# Patient Record
Sex: Male | Born: 1950
Health system: Southern US, Community
[De-identification: ages and names within clinical notes are randomized; demographics above are authoritative.]

## PROBLEM LIST (undated history)

## (undated) DIAGNOSIS — M109 Gout, unspecified: Secondary | ICD-10-CM

## (undated) DIAGNOSIS — I2510 Atherosclerotic heart disease of native coronary artery without angina pectoris: Secondary | ICD-10-CM

## (undated) DIAGNOSIS — K621 Rectal polyp: Secondary | ICD-10-CM

## (undated) DIAGNOSIS — N200 Calculus of kidney: Secondary | ICD-10-CM

## (undated) DIAGNOSIS — K635 Polyp of colon: Secondary | ICD-10-CM

## (undated) DIAGNOSIS — E785 Hyperlipidemia, unspecified: Secondary | ICD-10-CM

## (undated) DIAGNOSIS — E079 Disorder of thyroid, unspecified: Secondary | ICD-10-CM

## (undated) DIAGNOSIS — R9431 Abnormal electrocardiogram [ECG] [EKG]: Secondary | ICD-10-CM

## (undated) DIAGNOSIS — E039 Hypothyroidism, unspecified: Secondary | ICD-10-CM

## (undated) DIAGNOSIS — R578 Other shock: Secondary | ICD-10-CM

## (undated) DIAGNOSIS — K921 Melena: Secondary | ICD-10-CM

## (undated) DIAGNOSIS — E291 Testicular hypofunction: Secondary | ICD-10-CM

## (undated) DIAGNOSIS — I5082 Biventricular heart failure: Secondary | ICD-10-CM

## (undated) DIAGNOSIS — I10 Essential (primary) hypertension: Secondary | ICD-10-CM

## (undated) DIAGNOSIS — J189 Pneumonia, unspecified organism: Secondary | ICD-10-CM

## (undated) HISTORY — DX: Disorder of thyroid, unspecified: E07.9

## (undated) HISTORY — DX: Hyperlipidemia, unspecified: E78.5

## (undated) HISTORY — DX: Essential (primary) hypertension: I10

## (undated) HISTORY — PX: CARDIAC CATHETERIZATION: SHX172

## (undated) HISTORY — DX: Atherosclerotic heart disease of native coronary artery without angina pectoris: I25.10

## (undated) HISTORY — DX: Hypothyroidism, unspecified: E03.9

## (undated) HISTORY — DX: Biventricular heart failure: I50.82

## (undated) HISTORY — DX: Abnormal electrocardiogram (ECG) (EKG): R94.31

## (undated) HISTORY — DX: Polyp of colon: K63.5

## (undated) HISTORY — DX: Testicular hypofunction: E29.1

## (undated) HISTORY — DX: Rectal polyp: K62.1

## (undated) HISTORY — PX: FOOT SURGERY: SHX648

---

## 2002-10-10 ENCOUNTER — Encounter: Payer: Self-pay | Admitting: Family Medicine

## 2002-10-10 ENCOUNTER — Ambulatory Visit (HOSPITAL_COMMUNITY): Admission: RE | Admit: 2002-10-10 | Discharge: 2002-10-10 | Payer: Self-pay | Admitting: Family Medicine

## 2005-08-07 ENCOUNTER — Emergency Department (HOSPITAL_COMMUNITY): Admission: EM | Admit: 2005-08-07 | Discharge: 2005-08-07 | Payer: Self-pay | Admitting: Emergency Medicine

## 2013-02-04 ENCOUNTER — Other Ambulatory Visit: Payer: Self-pay | Admitting: Family Medicine

## 2013-02-04 DIAGNOSIS — R7989 Other specified abnormal findings of blood chemistry: Secondary | ICD-10-CM

## 2013-02-04 DIAGNOSIS — R945 Abnormal results of liver function studies: Principal | ICD-10-CM

## 2013-02-06 ENCOUNTER — Ambulatory Visit
Admission: RE | Admit: 2013-02-06 | Discharge: 2013-02-06 | Disposition: A | Discharge status: Home / Self Care | Payer: BC Managed Care – PPO | Source: Ambulatory Visit | Attending: Family Medicine | Admitting: Family Medicine

## 2013-02-06 DIAGNOSIS — R7989 Other specified abnormal findings of blood chemistry: Secondary | ICD-10-CM

## 2013-02-06 DIAGNOSIS — R945 Abnormal results of liver function studies: Principal | ICD-10-CM

## 2013-07-12 ENCOUNTER — Encounter (HOSPITAL_COMMUNITY): Payer: Self-pay | Admitting: Emergency Medicine

## 2013-07-12 ENCOUNTER — Emergency Department (HOSPITAL_COMMUNITY)
Admission: EM | Admit: 2013-07-12 | Discharge: 2013-07-12 | Disposition: A | Discharge status: Home / Self Care | Payer: BC Managed Care – PPO | Source: Home / Self Care

## 2013-07-12 ENCOUNTER — Emergency Department (INDEPENDENT_AMBULATORY_CARE_PROVIDER_SITE_OTHER): Payer: BC Managed Care – PPO

## 2013-07-12 DIAGNOSIS — R059 Cough, unspecified: Secondary | ICD-10-CM

## 2013-07-12 DIAGNOSIS — R05 Cough: Secondary | ICD-10-CM

## 2013-07-12 DIAGNOSIS — R0982 Postnasal drip: Secondary | ICD-10-CM

## 2013-07-12 HISTORY — DX: Pneumonia, unspecified organism: J18.9

## 2013-07-12 NOTE — Discharge Instructions (Signed)
Cough, Adult Allegra 180 mg a day If needed try ChlorTrimeton 2 mg for drying nasal passages Robitussin DM for cough Water before bed  A cough is a reflex that helps clear your throat and airways. It can help heal the body or may be a reaction to an irritated airway. A cough may only last 2 or 3 weeks (acute) or may last more than 8 weeks (chronic).  CAUSES Acute cough:  Viral or bacterial infections. Chronic cough:  Infections.  Allergies.  Asthma.  Post-nasal drip.  Smoking.  Heartburn or acid reflux.  Some medicines.  Chronic lung problems (COPD).  Cancer. SYMPTOMS   Cough.  Fever.  Chest pain.  Increased breathing rate.  High-pitched whistling sound when breathing (wheezing).  Colored mucus that you cough up (sputum). TREATMENT   A bacterial cough may be treated with antibiotic medicine.  A viral cough must run its course and will not respond to antibiotics.  Your caregiver may recommend other treatments if you have a chronic cough. HOME CARE INSTRUCTIONS   Only take over-the-counter or prescription medicines for pain, discomfort, or fever as directed by your caregiver. Use cough suppressants only as directed by your caregiver.  Use a cold steam vaporizer or humidifier in your bedroom or home to help loosen secretions.  Sleep in a semi-upright position if your cough is worse at night.  Rest as needed.  Stop smoking if you smoke. SEEK IMMEDIATE MEDICAL CARE IF:   You have pus in your sputum.  Your cough starts to worsen.  You cannot control your cough with suppressants and are losing sleep.  You begin coughing up blood.  You have difficulty breathing.  You develop pain which is getting worse or is uncontrolled with medicine.  You have a fever. MAKE SURE YOU:   Understand these instructions.  Will watch your condition.  Will get help right away if you are not doing well or get worse. Document Released: 07/09/2010 Document Revised:  04/04/2011 Document Reviewed: 07/09/2010 [facility] Patient Information 2015 [place], [place]. This information is not intended to replace advice given to you by your health care provider. Make sure you discuss any questions you have with your health care provider.

## 2013-07-12 NOTE — ED Notes (Signed)
Sick w strep from grandson, now developed cough . "prone to pneumonia"

## 2013-07-12 NOTE — ED Provider Notes (Signed)
CSN: [id number]     Arrival date & time 07/12/13  0919 History   First MD Initiated Contact with Patient 07/12/13 [phone]     Chief Complaint  Patient presents with  . URI   (Consider location/radiation/quality/duration/timing/severity/associated sxs/prior Treatment) HPI Comments: 63 year old male is accompanied by his wife with the complaint of cough and generalized muscle aches for the past 2 weeks. He denies fever or shortness of breath. Associated symptoms include nasal congestion, PND and rhinorrhea. He voices concern for bronchopneumonia after having a sore throat 3 weeks ago.   Past Medical History  Diagnosis Date  . Pneumonia    History reviewed. No pertinent past surgical history. History reviewed. No pertinent family history. History  Substance Use Topics  . Smoking status: Former Smoker    Quit date: 01/24/2002  . Smokeless tobacco: Not on file  . Alcohol Use: No    Review of Systems  Constitutional: Positive for fatigue. Negative for fever, chills and activity change.  HENT: Positive for congestion, postnasal drip and rhinorrhea. Negative for ear pain, sore throat and trouble swallowing.   Respiratory: Positive for cough. Negative for choking, chest tightness, shortness of breath, wheezing and stridor.   Cardiovascular: Negative.   Gastrointestinal: Positive for nausea.  Genitourinary: Negative.   Musculoskeletal: Positive for myalgias.  Skin: Negative for rash.  Neurological: Negative for dizziness and numbness.    Allergies  Codeine  Home Medications   Prior to Admission medications   Medication Sig Start Date End Date Taking? Authorizing Provider  thyroid (ARMOUR) 120 MG tablet Take 120 mg by mouth daily before breakfast.   Yes Historical Provider, MD   BP 154/91  Pulse 70  Temp(Src) 97.9 F (36.6 C)  Resp 18  SpO2 95% Physical Exam  Nursing note and vitals reviewed. Constitutional: He is oriented to person, place, and time. He appears well-developed  and well-nourished. No distress.  HENT:  Right Ear: External ear normal.  Left Ear: External ear normal.  Mouth/Throat: No oropharyngeal exudate.  Minor redness, clear PND  Eyes: Conjunctivae and EOM are normal.  Neck: Normal range of motion. Neck supple.  Cardiovascular: Normal rate, regular rhythm and normal heart sounds.   Pulmonary/Chest: Effort normal and breath sounds normal. No respiratory distress. He has no wheezes. He has no rales.  Musculoskeletal: Normal range of motion. He exhibits no edema.  Lymphadenopathy:    He has no cervical adenopathy.  Neurological: He is alert and oriented to person, place, and time.  Skin: Skin is warm and dry. No rash noted.  Psychiatric: He has a normal mood and affect.    ED Course  Procedures (including critical care time) Labs Review Labs Reviewed - No data to display  Imaging Review Dg Chest 2 View  07/12/2013   CLINICAL DATA:  Cough.  Fever.  Wheezing.  EXAM: CHEST  2 VIEW  COMPARISON:  None.  FINDINGS: Heart size is normal low lung volumes seen. Mild coarse interstitial prominence noted in both lung bases which is likely chronic. No evidence of pulmonary airspace disease or edema. No evidence of pleural effusion. No mass or lymphadenopathy identified.  IMPRESSION: Bibasilar pulmonary interstitial prominence, likely chronic in nature. No other significant abnormality identified.   Electronically Signed   By: Earle Gell M.D.   On: 07/12/2013 10:39     MDM   1. Cough   2. PND (post-nasal drip)     Allegra or chlortrimeton Saline nasal spray Robitussin DM Lots of fluids  Janne Napoleon, NP 07/12/13 [phone]

## 2013-07-13 NOTE — ED Provider Notes (Signed)
Medical screening examination/treatment/procedure(s) were performed by a resident physician or non-physician practitioner and as the supervising physician I was immediately available for consultation/collaboration.  Lynne Leader, MD    Gregor Hams, MD 07/13/13 [phone]

## 2014-03-07 ENCOUNTER — Encounter: Payer: Self-pay | Admitting: Internal Medicine

## 2014-03-07 ENCOUNTER — Ambulatory Visit (INDEPENDENT_AMBULATORY_CARE_PROVIDER_SITE_OTHER): Payer: BLUE CROSS/BLUE SHIELD | Admitting: Internal Medicine

## 2014-03-07 VITALS — BP 122/76 | HR 84 | Ht 71.0 in | Wt 240.0 lb

## 2014-03-07 DIAGNOSIS — R9431 Abnormal electrocardiogram [ECG] [EKG]: Secondary | ICD-10-CM

## 2014-03-07 NOTE — Patient Instructions (Signed)
Your physician recommends that you continue on your current medications as directed. Please refer to the Current Medication list given to you today.  Your physician has recommended that you wear a holter monitor. Holter monitors are medical devices that record the heart's electrical activity. Doctors most often use these monitors to diagnose arrhythmias. Arrhythmias are problems with the speed or rhythm of the heartbeat. The monitor is a small, portable device. You can wear one while you do your normal daily activities. This is usually used to diagnose what is causing palpitations/syncope (passing out).   

## 2014-03-07 NOTE — Progress Notes (Signed)
Cardiology Office Note   Date:  03/07/2014   ID:  Blake West, DOB [birth year], MRN [id number]  PCP:  Maximino Greenland, MD  Cardiologist:   Dorris Carnes, MD   [name] is a 64 yo who presetns for evaluation of ectopy on EKG    History of Present Illness: Blake West is a 65 y.o. male with a history of skipping and irreg heart beat for years  He denies palpitations.  No SOB  No CP  No dizziness H was seen by primary MD  EKG on 2/4 sohwed SR with PACs and Possible PVCs  First degree AV block     Current Outpatient Prescriptions  Medication Sig Dispense Refill  . Ascorbic Acid (VITAMIN C) 100 MG tablet Take 500 mg by mouth daily.    . cholecalciferol (VITAMIN D) 400 UNITS TABS tablet Take 400 Units by mouth.    . ergocalciferol (VITAMIN D2) 50000 UNITS capsule Take 50,000 Units by mouth once a week.    . Nutritional Supplements (DHEA PO) Take by mouth.    . Probiotic Product (PROBIOTIC DAILY PO) Take by mouth.    . thyroid (ARMOUR) 120 MG tablet Take 120 mg by mouth daily before breakfast.     No current facility-administered medications for this visit.    Allergies:   Codeine   Past Medical History  Diagnosis Date  . Pneumonia   . Abnormal EKG   . Hypertension   . Thyroid disease   . Testicular hypofunction     No past surgical history on file.   Social History:  The patient  reports that he quit smoking about 12 years ago. He does not have any smokeless tobacco history on file. He reports that he does not drink alcohol.   Family History:  The patient's family history includes Heart attack in his mother. 2 brothers with CAD  Received stents.  One of these brothers has an Fe storage disease   ROS:  Please see the history of present illness. All other systems are reviewed and  Negative to the above problem except as noted.    PHYSICAL EXAM: VS:  BP 122/76 mmHg  Pulse 84  Ht 5\' 11"  (1.803 m)  Wt 240 lb (108.863 kg)  BMI 33.49 kg/m2  GEN: Well nourished, well  developed, in no acute distress HEENT: normal Neck: no JVD, carotid bruits, or masses Cardiac: RRR; no murmurs, rubs, or gallops,no edema  Respiratory:  clear to auscultation bilaterally, normal work of breathing GI: soft, nontender, nondistended, + BS  No hepatomegaly  MS: no deformity Moving all extremities   Skin: warm and dry, no rash Neuro:  Strength and sensation are intact Psych: euthymic mood, full affect   EKG:  EKG is ordered today.  It showed SR 88  First degree AV block  PACs and PVCs     Lipid Panel No results found for: CHOL, TRIG, HDL, CHOLHDL, VLDL, LDLCALC, LDLDIRECT    Wt Readings from Last 3 Encounters:  03/07/14 240 lb (108.863 kg)      ASSESSMENT AND PLAN:  1.  Ectopy  [name] is asymptomatic  I would recomm Holter monitor to determine burden  Continue activities Further testing based on holter results    2  Health care maintenance:  Hgb 18   Hct 52.3   LDL 100  HDL 28   Recomm wt loss and exercise     Current medicines are reviewed at length with the patient today.  The patient does  not have concerns regarding medicines.   Labs/ tests ordered today include:  Holter monitor    Disposition:   FU with me is based on test results    Signed, Dorris Carnes, MD  03/07/2014 12:01 PM    [facility] [address], [place], [place]  [address] Phone: [phone]; Fax: [phone]

## 2014-03-17 ENCOUNTER — Encounter: Payer: Self-pay | Admitting: *Deleted

## 2014-03-17 ENCOUNTER — Encounter (INDEPENDENT_AMBULATORY_CARE_PROVIDER_SITE_OTHER): Payer: BLUE CROSS/BLUE SHIELD

## 2014-03-17 DIAGNOSIS — R9431 Abnormal electrocardiogram [ECG] [EKG]: Secondary | ICD-10-CM | POA: Diagnosis not present

## 2014-03-17 DIAGNOSIS — I440 Atrioventricular block, first degree: Secondary | ICD-10-CM

## 2014-03-17 DIAGNOSIS — I493 Ventricular premature depolarization: Secondary | ICD-10-CM | POA: Diagnosis not present

## 2014-03-17 NOTE — Progress Notes (Signed)
Patient ID: Blake West, male   DOB: 12/02/1950, 65 y.o.   MRN: [id number] [employer] 24 hour holter monitor applied to patient.

## 2014-03-24 ENCOUNTER — Telehealth: Payer: Self-pay | Admitting: Internal Medicine

## 2014-03-24 NOTE — Telephone Encounter (Signed)
New Msg        Tiffany with [employer] calling in regards to STAt    Day 1  Available on web,  Day 2 has no data on pt  48 hr holter monitor.   No return call needed.

## 2014-04-28 ENCOUNTER — Telehealth: Payer: Self-pay | Admitting: *Deleted

## 2014-04-28 NOTE — Telephone Encounter (Signed)
24 hr holter reviewed by Dr. Harrington Challenger "sinus rhythm 67-130 Average HR 102 BPM Occasional PVC's (1% total) Rec: -no RX needed. Pt asymptomatic, stay active"  LEFT MESSAGE FOR PATIENT TO CALL BACK TO INFORM.

## 2014-05-08 ENCOUNTER — Ambulatory Visit
Admission: RE | Admit: 2014-05-08 | Discharge: 2014-05-08 | Disposition: A | Discharge status: Home / Self Care | Payer: BLUE CROSS/BLUE SHIELD | Source: Ambulatory Visit | Attending: Nurse Practitioner | Admitting: Nurse Practitioner

## 2014-05-08 ENCOUNTER — Other Ambulatory Visit: Payer: Self-pay | Admitting: Nurse Practitioner

## 2014-05-08 DIAGNOSIS — J209 Acute bronchitis, unspecified: Secondary | ICD-10-CM

## 2014-05-22 NOTE — Telephone Encounter (Signed)
Called patient back to make sure he received message about monitor and that he did not have any questions/concerns. Reviewed results.  He would like Dr. Baird Cancer to be informed, and would like a copy of the interpretation sent to him as well.   He is feeling well. Confirmed his home address. No other concerns/questions at this time.

## 2015-04-27 DIAGNOSIS — E291 Testicular hypofunction: Secondary | ICD-10-CM | POA: Diagnosis not present

## 2015-04-27 DIAGNOSIS — N400 Enlarged prostate without lower urinary tract symptoms: Secondary | ICD-10-CM | POA: Diagnosis not present

## 2015-04-27 DIAGNOSIS — K760 Fatty (change of) liver, not elsewhere classified: Secondary | ICD-10-CM | POA: Diagnosis not present

## 2015-04-27 DIAGNOSIS — E782 Mixed hyperlipidemia: Secondary | ICD-10-CM | POA: Diagnosis not present

## 2015-04-27 DIAGNOSIS — E063 Autoimmune thyroiditis: Secondary | ICD-10-CM | POA: Diagnosis not present

## 2015-04-27 DIAGNOSIS — E559 Vitamin D deficiency, unspecified: Secondary | ICD-10-CM | POA: Diagnosis not present

## 2015-04-28 DIAGNOSIS — M25562 Pain in left knee: Secondary | ICD-10-CM | POA: Diagnosis not present

## 2015-04-28 DIAGNOSIS — R262 Difficulty in walking, not elsewhere classified: Secondary | ICD-10-CM | POA: Diagnosis not present

## 2015-04-28 DIAGNOSIS — M170 Bilateral primary osteoarthritis of knee: Secondary | ICD-10-CM | POA: Diagnosis not present

## 2015-04-28 DIAGNOSIS — M25561 Pain in right knee: Secondary | ICD-10-CM | POA: Diagnosis not present

## 2015-05-14 DIAGNOSIS — M25562 Pain in left knee: Secondary | ICD-10-CM | POA: Diagnosis not present

## 2015-05-14 DIAGNOSIS — R262 Difficulty in walking, not elsewhere classified: Secondary | ICD-10-CM | POA: Diagnosis not present

## 2015-05-14 DIAGNOSIS — M170 Bilateral primary osteoarthritis of knee: Secondary | ICD-10-CM | POA: Diagnosis not present

## 2015-05-14 DIAGNOSIS — M25561 Pain in right knee: Secondary | ICD-10-CM | POA: Diagnosis not present

## 2015-05-19 DIAGNOSIS — M1712 Unilateral primary osteoarthritis, left knee: Secondary | ICD-10-CM | POA: Diagnosis not present

## 2015-05-19 DIAGNOSIS — M25562 Pain in left knee: Secondary | ICD-10-CM | POA: Diagnosis not present

## 2015-05-21 DIAGNOSIS — M25561 Pain in right knee: Secondary | ICD-10-CM | POA: Diagnosis not present

## 2015-05-21 DIAGNOSIS — M1711 Unilateral primary osteoarthritis, right knee: Secondary | ICD-10-CM | POA: Diagnosis not present

## 2015-05-26 DIAGNOSIS — M25562 Pain in left knee: Secondary | ICD-10-CM | POA: Diagnosis not present

## 2015-05-26 DIAGNOSIS — M1712 Unilateral primary osteoarthritis, left knee: Secondary | ICD-10-CM | POA: Diagnosis not present

## 2015-05-28 DIAGNOSIS — M1711 Unilateral primary osteoarthritis, right knee: Secondary | ICD-10-CM | POA: Diagnosis not present

## 2015-05-28 DIAGNOSIS — M25561 Pain in right knee: Secondary | ICD-10-CM | POA: Diagnosis not present

## 2015-06-02 DIAGNOSIS — M25562 Pain in left knee: Secondary | ICD-10-CM | POA: Diagnosis not present

## 2015-06-02 DIAGNOSIS — M1712 Unilateral primary osteoarthritis, left knee: Secondary | ICD-10-CM | POA: Diagnosis not present

## 2015-06-04 DIAGNOSIS — M1711 Unilateral primary osteoarthritis, right knee: Secondary | ICD-10-CM | POA: Diagnosis not present

## 2015-06-04 DIAGNOSIS — M25561 Pain in right knee: Secondary | ICD-10-CM | POA: Diagnosis not present

## 2015-06-10 DIAGNOSIS — M25562 Pain in left knee: Secondary | ICD-10-CM | POA: Diagnosis not present

## 2015-06-10 DIAGNOSIS — M25561 Pain in right knee: Secondary | ICD-10-CM | POA: Diagnosis not present

## 2015-06-10 DIAGNOSIS — M170 Bilateral primary osteoarthritis of knee: Secondary | ICD-10-CM | POA: Diagnosis not present

## 2015-09-17 ENCOUNTER — Emergency Department (HOSPITAL_COMMUNITY): Payer: No Typology Code available for payment source

## 2015-09-17 ENCOUNTER — Encounter (HOSPITAL_COMMUNITY): Payer: Self-pay | Admitting: *Deleted

## 2015-09-17 ENCOUNTER — Emergency Department (HOSPITAL_COMMUNITY)
Admission: EM | Admit: 2015-09-17 | Discharge: 2015-09-17 | Disposition: A | Discharge status: Home / Self Care | Payer: No Typology Code available for payment source | Attending: Emergency Medicine | Admitting: Emergency Medicine

## 2015-09-17 DIAGNOSIS — Y92410 Unspecified street and highway as the place of occurrence of the external cause: Secondary | ICD-10-CM | POA: Diagnosis not present

## 2015-09-17 DIAGNOSIS — Y999 Unspecified external cause status: Secondary | ICD-10-CM | POA: Diagnosis not present

## 2015-09-17 DIAGNOSIS — S0990XA Unspecified injury of head, initial encounter: Secondary | ICD-10-CM | POA: Diagnosis not present

## 2015-09-17 DIAGNOSIS — S161XXA Strain of muscle, fascia and tendon at neck level, initial encounter: Secondary | ICD-10-CM | POA: Insufficient documentation

## 2015-09-17 DIAGNOSIS — Y939 Activity, unspecified: Secondary | ICD-10-CM | POA: Diagnosis not present

## 2015-09-17 DIAGNOSIS — Z87891 Personal history of nicotine dependence: Secondary | ICD-10-CM | POA: Diagnosis not present

## 2015-09-17 DIAGNOSIS — I10 Essential (primary) hypertension: Secondary | ICD-10-CM | POA: Insufficient documentation

## 2015-09-17 DIAGNOSIS — R200 Anesthesia of skin: Secondary | ICD-10-CM | POA: Diagnosis not present

## 2015-09-17 DIAGNOSIS — S199XXA Unspecified injury of neck, initial encounter: Secondary | ICD-10-CM | POA: Diagnosis not present

## 2015-09-17 HISTORY — DX: Gout, unspecified: M10.9

## 2015-09-17 MED ORDER — NAPROXEN SODIUM 220 MG PO TABS: 220.0000 mg | ORAL_TABLET | Freq: Two times a day (BID) | ORAL | 0 refills | Status: DC

## 2015-09-17 MED ORDER — TRAMADOL HCL 50 MG PO TABS: 50.0000 mg | ORAL_TABLET | Freq: Two times a day (BID) | ORAL | 0 refills | Status: DC | PRN

## 2015-09-17 MED ORDER — CYCLOBENZAPRINE HCL 10 MG PO TABS: 10.0000 mg | ORAL_TABLET | Freq: Two times a day (BID) | ORAL | 0 refills | Status: DC | PRN

## 2015-09-17 NOTE — ED Notes (Signed)
CT's placed per verbal order of Dr Eulis Foster.

## 2015-09-17 NOTE — ED Notes (Signed)
Called CT.  They stated pt is in CT at this time.
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
## 2015-11-09 DIAGNOSIS — M25562 Pain in left knee: Secondary | ICD-10-CM | POA: Diagnosis not present

## 2015-11-09 DIAGNOSIS — M25561 Pain in right knee: Secondary | ICD-10-CM | POA: Diagnosis not present

## 2015-11-09 DIAGNOSIS — M170 Bilateral primary osteoarthritis of knee: Secondary | ICD-10-CM | POA: Diagnosis not present

## 2015-11-24 DIAGNOSIS — R262 Difficulty in walking, not elsewhere classified: Secondary | ICD-10-CM | POA: Diagnosis not present

## 2015-11-24 DIAGNOSIS — M25562 Pain in left knee: Secondary | ICD-10-CM | POA: Diagnosis not present

## 2015-11-24 DIAGNOSIS — M1712 Unilateral primary osteoarthritis, left knee: Secondary | ICD-10-CM | POA: Diagnosis not present

## 2016-01-20 DIAGNOSIS — M25562 Pain in left knee: Secondary | ICD-10-CM | POA: Diagnosis not present

## 2016-01-20 DIAGNOSIS — M25561 Pain in right knee: Secondary | ICD-10-CM | POA: Diagnosis not present

## 2016-01-20 DIAGNOSIS — M170 Bilateral primary osteoarthritis of knee: Secondary | ICD-10-CM | POA: Diagnosis not present

## 2016-02-04 DIAGNOSIS — M25561 Pain in right knee: Secondary | ICD-10-CM | POA: Diagnosis not present

## 2016-02-04 DIAGNOSIS — M25562 Pain in left knee: Secondary | ICD-10-CM | POA: Diagnosis not present

## 2016-02-04 DIAGNOSIS — M170 Bilateral primary osteoarthritis of knee: Secondary | ICD-10-CM | POA: Diagnosis not present

## 2016-02-09 DIAGNOSIS — M1712 Unilateral primary osteoarthritis, left knee: Secondary | ICD-10-CM | POA: Diagnosis not present

## 2016-02-09 DIAGNOSIS — M25562 Pain in left knee: Secondary | ICD-10-CM | POA: Diagnosis not present

## 2016-02-12 DIAGNOSIS — M25561 Pain in right knee: Secondary | ICD-10-CM | POA: Diagnosis not present

## 2016-02-12 DIAGNOSIS — M1711 Unilateral primary osteoarthritis, right knee: Secondary | ICD-10-CM | POA: Diagnosis not present

## 2016-02-16 DIAGNOSIS — M1712 Unilateral primary osteoarthritis, left knee: Secondary | ICD-10-CM | POA: Diagnosis not present

## 2016-02-16 DIAGNOSIS — M25562 Pain in left knee: Secondary | ICD-10-CM | POA: Diagnosis not present

## 2016-02-18 DIAGNOSIS — M1711 Unilateral primary osteoarthritis, right knee: Secondary | ICD-10-CM | POA: Diagnosis not present

## 2016-02-18 DIAGNOSIS — M25561 Pain in right knee: Secondary | ICD-10-CM | POA: Diagnosis not present

## 2016-02-23 DIAGNOSIS — M1712 Unilateral primary osteoarthritis, left knee: Secondary | ICD-10-CM | POA: Diagnosis not present

## 2016-02-23 DIAGNOSIS — M25562 Pain in left knee: Secondary | ICD-10-CM | POA: Diagnosis not present

## 2016-02-25 DIAGNOSIS — M25561 Pain in right knee: Secondary | ICD-10-CM | POA: Diagnosis not present

## 2016-02-25 DIAGNOSIS — M1711 Unilateral primary osteoarthritis, right knee: Secondary | ICD-10-CM | POA: Diagnosis not present

## 2016-03-03 DIAGNOSIS — M25562 Pain in left knee: Secondary | ICD-10-CM | POA: Diagnosis not present

## 2016-03-03 DIAGNOSIS — M25561 Pain in right knee: Secondary | ICD-10-CM | POA: Diagnosis not present

## 2016-03-03 DIAGNOSIS — M170 Bilateral primary osteoarthritis of knee: Secondary | ICD-10-CM | POA: Diagnosis not present

## 2016-11-24 ENCOUNTER — Encounter (HOSPITAL_COMMUNITY): Payer: Self-pay | Admitting: Emergency Medicine

## 2016-11-24 ENCOUNTER — Ambulatory Visit (INDEPENDENT_AMBULATORY_CARE_PROVIDER_SITE_OTHER): Payer: Self-pay

## 2016-11-24 ENCOUNTER — Ambulatory Visit (HOSPITAL_COMMUNITY)
Admission: EM | Admit: 2016-11-24 | Discharge: 2016-11-24 | Disposition: A | Discharge status: Home / Self Care | Payer: Self-pay | Attending: Emergency Medicine | Admitting: Emergency Medicine

## 2016-11-24 DIAGNOSIS — M79605 Pain in left leg: Secondary | ICD-10-CM

## 2016-11-24 DIAGNOSIS — M79604 Pain in right leg: Secondary | ICD-10-CM

## 2016-11-24 DIAGNOSIS — J9811 Atelectasis: Secondary | ICD-10-CM

## 2016-11-24 DIAGNOSIS — R0781 Pleurodynia: Secondary | ICD-10-CM

## 2016-11-24 HISTORY — DX: Calculus of kidney: N20.0

## 2016-11-24 MED ORDER — DICLOFENAC SODIUM 1 % TD GEL: 4.0000 g | Freq: Three times a day (TID) | TRANSDERMAL | 0 refills | Status: DC | PRN
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
## 2016-11-24 NOTE — Discharge Instructions (Addendum)
Recommend continue Ibuprofen 600mg  every 6 hours as needed for pain. May also use Voltaren gel- apply 3 times a day to affected areas as needed. No heavy lifting for the next 5 days. Follow-up with your PCP in 5 to 7 days if symptoms do not resolve
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
## 2017-11-09 DIAGNOSIS — M545 Low back pain: Secondary | ICD-10-CM | POA: Diagnosis not present

## 2017-11-09 DIAGNOSIS — M9903 Segmental and somatic dysfunction of lumbar region: Secondary | ICD-10-CM | POA: Diagnosis not present

## 2017-11-09 DIAGNOSIS — M9902 Segmental and somatic dysfunction of thoracic region: Secondary | ICD-10-CM | POA: Diagnosis not present

## 2017-11-09 DIAGNOSIS — M546 Pain in thoracic spine: Secondary | ICD-10-CM | POA: Diagnosis not present

## 2017-11-16 DIAGNOSIS — M545 Low back pain: Secondary | ICD-10-CM | POA: Diagnosis not present

## 2017-11-16 DIAGNOSIS — M9903 Segmental and somatic dysfunction of lumbar region: Secondary | ICD-10-CM | POA: Diagnosis not present

## 2017-11-16 DIAGNOSIS — M546 Pain in thoracic spine: Secondary | ICD-10-CM | POA: Diagnosis not present

## 2017-11-16 DIAGNOSIS — M9902 Segmental and somatic dysfunction of thoracic region: Secondary | ICD-10-CM | POA: Diagnosis not present

## 2017-11-20 DIAGNOSIS — M9902 Segmental and somatic dysfunction of thoracic region: Secondary | ICD-10-CM | POA: Diagnosis not present

## 2017-11-20 DIAGNOSIS — M546 Pain in thoracic spine: Secondary | ICD-10-CM | POA: Diagnosis not present

## 2017-11-20 DIAGNOSIS — M545 Low back pain: Secondary | ICD-10-CM | POA: Diagnosis not present

## 2017-11-20 DIAGNOSIS — M9903 Segmental and somatic dysfunction of lumbar region: Secondary | ICD-10-CM | POA: Diagnosis not present

## 2017-11-30 DIAGNOSIS — M9902 Segmental and somatic dysfunction of thoracic region: Secondary | ICD-10-CM | POA: Diagnosis not present

## 2017-11-30 DIAGNOSIS — M545 Low back pain: Secondary | ICD-10-CM | POA: Diagnosis not present

## 2017-11-30 DIAGNOSIS — M9903 Segmental and somatic dysfunction of lumbar region: Secondary | ICD-10-CM | POA: Diagnosis not present

## 2017-11-30 DIAGNOSIS — M546 Pain in thoracic spine: Secondary | ICD-10-CM | POA: Diagnosis not present

## 2017-12-14 DIAGNOSIS — M9902 Segmental and somatic dysfunction of thoracic region: Secondary | ICD-10-CM | POA: Diagnosis not present

## 2017-12-14 DIAGNOSIS — M546 Pain in thoracic spine: Secondary | ICD-10-CM | POA: Diagnosis not present

## 2017-12-14 DIAGNOSIS — M545 Low back pain: Secondary | ICD-10-CM | POA: Diagnosis not present

## 2017-12-14 DIAGNOSIS — M9903 Segmental and somatic dysfunction of lumbar region: Secondary | ICD-10-CM | POA: Diagnosis not present

## 2018-01-11 DIAGNOSIS — M9902 Segmental and somatic dysfunction of thoracic region: Secondary | ICD-10-CM | POA: Diagnosis not present

## 2018-01-11 DIAGNOSIS — M9903 Segmental and somatic dysfunction of lumbar region: Secondary | ICD-10-CM | POA: Diagnosis not present

## 2018-01-11 DIAGNOSIS — M546 Pain in thoracic spine: Secondary | ICD-10-CM | POA: Diagnosis not present

## 2018-01-11 DIAGNOSIS — M545 Low back pain: Secondary | ICD-10-CM | POA: Diagnosis not present
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
## 2018-02-15 DIAGNOSIS — M9902 Segmental and somatic dysfunction of thoracic region: Secondary | ICD-10-CM | POA: Diagnosis not present

## 2018-02-15 DIAGNOSIS — M545 Low back pain: Secondary | ICD-10-CM | POA: Diagnosis not present

## 2018-02-15 DIAGNOSIS — M546 Pain in thoracic spine: Secondary | ICD-10-CM | POA: Diagnosis not present

## 2018-02-15 DIAGNOSIS — M9903 Segmental and somatic dysfunction of lumbar region: Secondary | ICD-10-CM | POA: Diagnosis not present

## 2018-02-22 DIAGNOSIS — M9903 Segmental and somatic dysfunction of lumbar region: Secondary | ICD-10-CM | POA: Diagnosis not present

## 2018-02-22 DIAGNOSIS — M9902 Segmental and somatic dysfunction of thoracic region: Secondary | ICD-10-CM | POA: Diagnosis not present

## 2018-02-22 DIAGNOSIS — M546 Pain in thoracic spine: Secondary | ICD-10-CM | POA: Diagnosis not present

## 2018-02-22 DIAGNOSIS — M545 Low back pain: Secondary | ICD-10-CM | POA: Diagnosis not present

## 2018-03-08 DIAGNOSIS — M9902 Segmental and somatic dysfunction of thoracic region: Secondary | ICD-10-CM | POA: Diagnosis not present

## 2018-03-08 DIAGNOSIS — M9903 Segmental and somatic dysfunction of lumbar region: Secondary | ICD-10-CM | POA: Diagnosis not present

## 2018-03-08 DIAGNOSIS — M545 Low back pain: Secondary | ICD-10-CM | POA: Diagnosis not present

## 2018-03-08 DIAGNOSIS — M546 Pain in thoracic spine: Secondary | ICD-10-CM | POA: Diagnosis not present

## 2018-04-05 DIAGNOSIS — M9902 Segmental and somatic dysfunction of thoracic region: Secondary | ICD-10-CM | POA: Diagnosis not present

## 2018-04-05 DIAGNOSIS — M546 Pain in thoracic spine: Secondary | ICD-10-CM | POA: Diagnosis not present

## 2018-04-05 DIAGNOSIS — M9903 Segmental and somatic dysfunction of lumbar region: Secondary | ICD-10-CM | POA: Diagnosis not present

## 2018-04-05 DIAGNOSIS — M545 Low back pain: Secondary | ICD-10-CM | POA: Diagnosis not present

## 2018-04-12 DIAGNOSIS — M546 Pain in thoracic spine: Secondary | ICD-10-CM | POA: Diagnosis not present

## 2018-04-12 DIAGNOSIS — M9903 Segmental and somatic dysfunction of lumbar region: Secondary | ICD-10-CM | POA: Diagnosis not present

## 2018-04-12 DIAGNOSIS — M9902 Segmental and somatic dysfunction of thoracic region: Secondary | ICD-10-CM | POA: Diagnosis not present

## 2018-04-12 DIAGNOSIS — M545 Low back pain: Secondary | ICD-10-CM | POA: Diagnosis not present

## 2018-05-29 ENCOUNTER — Telehealth: Payer: Self-pay

## 2018-05-31 DIAGNOSIS — M545 Low back pain: Secondary | ICD-10-CM | POA: Diagnosis not present

## 2018-05-31 DIAGNOSIS — M9902 Segmental and somatic dysfunction of thoracic region: Secondary | ICD-10-CM | POA: Diagnosis not present

## 2018-05-31 DIAGNOSIS — M546 Pain in thoracic spine: Secondary | ICD-10-CM | POA: Diagnosis not present

## 2018-05-31 DIAGNOSIS — M9903 Segmental and somatic dysfunction of lumbar region: Secondary | ICD-10-CM | POA: Diagnosis not present

## 2018-06-28 DIAGNOSIS — M546 Pain in thoracic spine: Secondary | ICD-10-CM | POA: Diagnosis not present

## 2018-06-28 DIAGNOSIS — M9902 Segmental and somatic dysfunction of thoracic region: Secondary | ICD-10-CM | POA: Diagnosis not present

## 2018-06-28 DIAGNOSIS — M545 Low back pain: Secondary | ICD-10-CM | POA: Diagnosis not present

## 2018-06-28 DIAGNOSIS — M9903 Segmental and somatic dysfunction of lumbar region: Secondary | ICD-10-CM | POA: Diagnosis not present

## 2018-08-02 DIAGNOSIS — M9902 Segmental and somatic dysfunction of thoracic region: Secondary | ICD-10-CM | POA: Diagnosis not present

## 2018-08-02 DIAGNOSIS — M545 Low back pain: Secondary | ICD-10-CM | POA: Diagnosis not present

## 2018-08-02 DIAGNOSIS — M546 Pain in thoracic spine: Secondary | ICD-10-CM | POA: Diagnosis not present

## 2018-08-02 DIAGNOSIS — M9903 Segmental and somatic dysfunction of lumbar region: Secondary | ICD-10-CM | POA: Diagnosis not present

## 2018-08-16 DIAGNOSIS — M9903 Segmental and somatic dysfunction of lumbar region: Secondary | ICD-10-CM | POA: Diagnosis not present

## 2018-08-16 DIAGNOSIS — M546 Pain in thoracic spine: Secondary | ICD-10-CM | POA: Diagnosis not present

## 2018-08-16 DIAGNOSIS — M9902 Segmental and somatic dysfunction of thoracic region: Secondary | ICD-10-CM | POA: Diagnosis not present

## 2018-08-16 DIAGNOSIS — M545 Low back pain: Secondary | ICD-10-CM | POA: Diagnosis not present

## 2018-08-27 ENCOUNTER — Other Ambulatory Visit: Payer: Self-pay

## 2018-08-27 NOTE — Telephone Encounter (Signed)
Created in error

## 2018-09-13 DIAGNOSIS — M545 Low back pain: Secondary | ICD-10-CM | POA: Diagnosis not present

## 2018-09-13 DIAGNOSIS — M9903 Segmental and somatic dysfunction of lumbar region: Secondary | ICD-10-CM | POA: Diagnosis not present

## 2018-10-11 DIAGNOSIS — M545 Low back pain: Secondary | ICD-10-CM | POA: Diagnosis not present

## 2018-10-11 DIAGNOSIS — M9903 Segmental and somatic dysfunction of lumbar region: Secondary | ICD-10-CM | POA: Diagnosis not present

## 2019-03-12 ENCOUNTER — Telehealth: Payer: Self-pay | Admitting: General Practice

## 2019-03-12 NOTE — Telephone Encounter (Signed)
I left a message asking the pt to call me at [phone] to confirm current PCP.

## 2019-03-13 ENCOUNTER — Telehealth: Payer: Self-pay | Admitting: General Practice

## 2019-03-13 NOTE — Telephone Encounter (Signed)
Called to schedule appt or confirm PCP. No answer

## 2019-04-17 ENCOUNTER — Ambulatory Visit
Admission: EM | Admit: 2019-04-17 | Discharge: 2019-04-17 | Disposition: A | Discharge status: Home / Self Care | Payer: PPO | Attending: Physician Assistant | Admitting: Physician Assistant

## 2019-04-17 ENCOUNTER — Ambulatory Visit (INDEPENDENT_AMBULATORY_CARE_PROVIDER_SITE_OTHER): Payer: PPO

## 2019-04-17 DIAGNOSIS — R05 Cough: Secondary | ICD-10-CM | POA: Diagnosis not present

## 2019-04-17 DIAGNOSIS — R0602 Shortness of breath: Secondary | ICD-10-CM | POA: Diagnosis not present

## 2019-04-17 DIAGNOSIS — I509 Heart failure, unspecified: Secondary | ICD-10-CM | POA: Diagnosis not present

## 2019-04-17 NOTE — Discharge Instructions (Signed)
As discussed, your exam and xray right now suggests heart failure. As your preference, you will continue your natural supplements. Please go to the ED if symptoms worsens.

## 2019-04-17 NOTE — ED Provider Notes (Signed)
[facility]    CSN: [id number] Arrival date & time: 04/17/19  1052      History   Chief Complaint Chief Complaint  Patient presents with  . Shortness of Breath    HPI Blake West is a 69 y.o. male.   69 year old male with history of HTN  comes in for 1.5 week history of URI symptoms. Has had productive cough, congestion. Now with shortness of breath, dyspnea on exertion and orthopnea. Denies chest pain. Feels that he has foot swelling. Denies fever, chills, body aches. Denies abdominal pain, nausea, vomiting, diarrhea. Denies loss of taste/smell. Former smoker.  Patient with history of OSA and stopped using cpap when these symptoms started.      Past Medical History:  Diagnosis Date  . Abnormal EKG   . Gout   . Hypertension   . Kidney stone   . Pneumonia   . Testicular hypofunction   . Thyroid disease     There are no problems to display for this patient.   Past Surgical History:  Procedure Laterality Date  . FOOT SURGERY       Home Medications    Prior to Admission medications   Medication Sig Start Date End Date Taking? Authorizing Provider  Ascorbic Acid (VITAMIN C) 100 MG tablet Take 500 mg by mouth daily.    [provider]  cholecalciferol (VITAMIN D) 400 UNITS TABS tablet Take 400 Units by mouth.    [provider]  ergocalciferol (VITAMIN D2) 50000 UNITS capsule Take 50,000 Units by mouth once a week.    [provider]  Nutritional Supplements (DHEA PO) Take 1 tablet by mouth daily.     [provider]  OVER THE COUNTER MEDICATION Thyroid otc medicine Simplex m    [provider]  Probiotic Product (PROBIOTIC DAILY PO) Take by mouth.    [provider]  VITAMIN E PO Take 1 tablet by mouth 2 (two) times daily.    [provider]    Family History Family History  Problem Relation Age of Onset  . Heart attack Mother     Social History Social History   Tobacco Use    . Smoking status: Former Smoker    Quit date: 01/24/2002    Years since quitting: 17.2  . Smokeless tobacco: Never Used  Substance Use Topics  . Alcohol use: No  . Drug use: No     Allergies   Codeine   Review of Systems Review of Systems  Reason unable to perform ROS: See HPI as above.     Physical Exam Triage Vital Signs ED Triage Vitals  Enc Vitals Group     BP 04/17/19 1112 134/79     Pulse Rate 04/17/19 1110 94     Resp 04/17/19 1110 18     Temp 04/17/19 1110 97.9 F (36.6 C)     Temp Source 04/17/19 1110 Oral     SpO2 04/17/19 1110 94 %     Weight --      Height --      Head Circumference --      Peak Flow --      Pain Score 04/17/19 1111 4     Pain Loc --      Pain Edu? --      Excl. in [place]? --    No data found.  Updated Vital Signs BP 134/79 (BP Location: Left Arm)   Pulse 94   Temp 97.9 F (  36.6 C) (Oral)   Resp 18   SpO2 94%   Physical Exam Constitutional:      General: He is not in acute distress.    Appearance: Normal appearance. He is not ill-appearing, toxic-appearing or diaphoretic.  HENT:     Head: Normocephalic and atraumatic.     Mouth/Throat:     Mouth: Mucous membranes are moist.     Pharynx: Oropharynx is clear. Uvula midline.  Cardiovascular:     Rate and Rhythm: Normal rate and regular rhythm.     Heart sounds: Normal heart sounds. No murmur. No friction rub. No gallop.   Pulmonary:     Effort: No prolonged expiration or retractions.     Comments: Speaking in full sentences but with increase work of breathing. No tachypnea. No respiratory distress. Bilateral lower lobe crackles. Musculoskeletal:     Cervical back: Normal range of motion and neck supple.     Comments: Bilateral 2+ pitting edema to mid shins. No erythema, warmth. No tenderness  Neurological:     General: No focal deficit present.     Mental Status: He is alert and oriented to person, place, and time.      UC Treatments / Results  Labs (all labs ordered  are listed, but only abnormal results are displayed) Labs Reviewed - No data to display  EKG   Radiology DG Chest 2 View  Result Date: 04/17/2019 CLINICAL DATA:  Patient complains of cough and congestion X 1 week. Patient is now having SOB and having to sit up at night to sleep. Hx of pneumonia and HTN. Former smoker. cough, shortness of breath, leg swelling, orthopnea EXAM: CHEST - 2 VIEW COMPARISON:  Radiograph 11/24/2016 FINDINGS: Normal cardiac silhouette. Lungs are hyperinflated. There is increased interstitial markings the lower lobe predominance. No focal consolidation. No pleural fluid. IMPRESSION: Lower lobe increased interstitial markings with differential of multifocal pneumonia (including COVID pneumonia) versus interstitial edema. These results will be called to the ordering clinician or representative by the Radiologist Assistant, and communication documented in the PACS or [employer]. Electronically Signed   By: Suzy Bouchard M.D.   On: 04/17/2019 11:47    Procedures Procedures (including critical care time)  Medications Ordered in UC Medications - No data to display  Initial Impression / Assessment and Plan / UC Course  I have reviewed the triage vital signs and the nursing notes.  Pertinent labs & imaging results that were available during my care of the patient were reviewed by me and considered in my medical decision making (see chart for details).    69 year old male with history of HTN, OSA comes in for 1.5-week history of URI symptoms.  Now with shortness of breath, and he was worried about pneumonia and came in for evaluation.  Patient with increased work of breathing without tachypnea.  O2 sat 94% on room air. Heart regular rate and rhythm, no murmurs, gallops, rubs.  Lungs with bilateral lower crackles.  2+ pitting edema to the lower legs.  Discussed work-up including chest x-ray, Covid testing, possible blood work.  Discussed with patient as well as  spouse, who declined Covid testing and blood work.  Obtain chest x-ray.  Discussed chest xray results with patient and spouse. Current clinical presentation worries for acute CHF. Discussed BMP/BNP and starting lasix as spouse declined ED evaluation. Spouse declined this workup, stating that patient can continue natural supplements that will "pull the fluid it out". Discussed risks. Patient and spouse declined further workup/treatment. Patient discharged  in stable condition.   Final Clinical Impressions(s) / UC Diagnoses   Final diagnoses:  Acute congestive heart failure, unspecified heart failure type [facility])    ED Prescriptions    None     PDMP not reviewed this encounter.   Ok Edwards, PA-C 04/17/19 1201

## 2019-04-17 NOTE — ED Triage Notes (Signed)
Pt c/o cough and congestion x1 wk. States now having SOB and having to sit up at night to sleep. States hx of PNA every year.

## 2019-04-25 ENCOUNTER — Encounter (HOSPITAL_COMMUNITY): Payer: Self-pay | Admitting: Internal Medicine

## 2019-04-25 ENCOUNTER — Inpatient Hospital Stay (HOSPITAL_COMMUNITY)
Admission: EM | Admit: 2019-04-25 | Discharge: 2019-05-01 | DRG: 286 | Disposition: A | Discharge status: Home / Self Care | Payer: PPO | Attending: Internal Medicine | Admitting: Internal Medicine

## 2019-04-25 ENCOUNTER — Telehealth: Payer: Self-pay | Admitting: Nurse Practitioner

## 2019-04-25 ENCOUNTER — Other Ambulatory Visit: Payer: Self-pay

## 2019-04-25 ENCOUNTER — Emergency Department (HOSPITAL_COMMUNITY): Payer: PPO

## 2019-04-25 DIAGNOSIS — Z87442 Personal history of urinary calculi: Secondary | ICD-10-CM | POA: Diagnosis not present

## 2019-04-25 DIAGNOSIS — Z20822 Contact with and (suspected) exposure to covid-19: Secondary | ICD-10-CM | POA: Diagnosis not present

## 2019-04-25 DIAGNOSIS — Z8249 Family history of ischemic heart disease and other diseases of the circulatory system: Secondary | ICD-10-CM

## 2019-04-25 DIAGNOSIS — I5082 Biventricular heart failure: Secondary | ICD-10-CM | POA: Diagnosis present

## 2019-04-25 DIAGNOSIS — Z5320 Procedure and treatment not carried out because of patient's decision for unspecified reasons: Secondary | ICD-10-CM | POA: Diagnosis not present

## 2019-04-25 DIAGNOSIS — I429 Cardiomyopathy, unspecified: Secondary | ICD-10-CM | POA: Diagnosis not present

## 2019-04-25 DIAGNOSIS — I5021 Acute systolic (congestive) heart failure: Secondary | ICD-10-CM | POA: Diagnosis present

## 2019-04-25 DIAGNOSIS — I2510 Atherosclerotic heart disease of native coronary artery without angina pectoris: Secondary | ICD-10-CM | POA: Diagnosis not present

## 2019-04-25 DIAGNOSIS — Z6836 Body mass index (BMI) 36.0-36.9, adult: Secondary | ICD-10-CM

## 2019-04-25 DIAGNOSIS — R0902 Hypoxemia: Secondary | ICD-10-CM | POA: Diagnosis present

## 2019-04-25 DIAGNOSIS — I152 Hypertension secondary to endocrine disorders: Secondary | ICD-10-CM | POA: Diagnosis present

## 2019-04-25 DIAGNOSIS — F419 Anxiety disorder, unspecified: Secondary | ICD-10-CM | POA: Diagnosis present

## 2019-04-25 DIAGNOSIS — I421 Obstructive hypertrophic cardiomyopathy: Secondary | ICD-10-CM

## 2019-04-25 DIAGNOSIS — I130 Hypertensive heart and chronic kidney disease with heart failure and stage 1 through stage 4 chronic kidney disease, or unspecified chronic kidney disease: Secondary | ICD-10-CM | POA: Diagnosis not present

## 2019-04-25 DIAGNOSIS — I10 Essential (primary) hypertension: Secondary | ICD-10-CM | POA: Diagnosis not present

## 2019-04-25 DIAGNOSIS — E119 Type 2 diabetes mellitus without complications: Secondary | ICD-10-CM

## 2019-04-25 DIAGNOSIS — Z66 Do not resuscitate: Secondary | ICD-10-CM | POA: Diagnosis not present

## 2019-04-25 DIAGNOSIS — E1122 Type 2 diabetes mellitus with diabetic chronic kidney disease: Secondary | ICD-10-CM | POA: Diagnosis not present

## 2019-04-25 DIAGNOSIS — I472 Ventricular tachycardia: Secondary | ICD-10-CM | POA: Diagnosis present

## 2019-04-25 DIAGNOSIS — E785 Hyperlipidemia, unspecified: Secondary | ICD-10-CM | POA: Diagnosis present

## 2019-04-25 DIAGNOSIS — Z87891 Personal history of nicotine dependence: Secondary | ICD-10-CM

## 2019-04-25 DIAGNOSIS — E039 Hypothyroidism, unspecified: Secondary | ICD-10-CM | POA: Diagnosis not present

## 2019-04-25 DIAGNOSIS — I110 Hypertensive heart disease with heart failure: Secondary | ICD-10-CM | POA: Diagnosis not present

## 2019-04-25 DIAGNOSIS — N179 Acute kidney failure, unspecified: Secondary | ICD-10-CM | POA: Diagnosis present

## 2019-04-25 DIAGNOSIS — G4733 Obstructive sleep apnea (adult) (pediatric): Secondary | ICD-10-CM | POA: Diagnosis not present

## 2019-04-25 DIAGNOSIS — I509 Heart failure, unspecified: Secondary | ICD-10-CM | POA: Diagnosis not present

## 2019-04-25 DIAGNOSIS — E66812 Obesity, class 2: Secondary | ICD-10-CM

## 2019-04-25 DIAGNOSIS — Z79899 Other long term (current) drug therapy: Secondary | ICD-10-CM

## 2019-04-25 DIAGNOSIS — I5041 Acute combined systolic (congestive) and diastolic (congestive) heart failure: Secondary | ICD-10-CM | POA: Diagnosis present

## 2019-04-25 DIAGNOSIS — M109 Gout, unspecified: Secondary | ICD-10-CM | POA: Diagnosis present

## 2019-04-25 DIAGNOSIS — I420 Dilated cardiomyopathy: Secondary | ICD-10-CM | POA: Diagnosis not present

## 2019-04-25 DIAGNOSIS — E7849 Other hyperlipidemia: Secondary | ICD-10-CM | POA: Diagnosis not present

## 2019-04-25 DIAGNOSIS — L409 Psoriasis, unspecified: Secondary | ICD-10-CM | POA: Diagnosis present

## 2019-04-25 DIAGNOSIS — R0602 Shortness of breath: Secondary | ICD-10-CM | POA: Diagnosis not present

## 2019-04-25 DIAGNOSIS — N182 Chronic kidney disease, stage 2 (mild): Secondary | ICD-10-CM | POA: Diagnosis present

## 2019-04-25 DIAGNOSIS — I493 Ventricular premature depolarization: Secondary | ICD-10-CM | POA: Diagnosis present

## 2019-04-25 DIAGNOSIS — I340 Nonrheumatic mitral (valve) insufficiency: Secondary | ICD-10-CM | POA: Diagnosis not present

## 2019-04-25 DIAGNOSIS — E1159 Type 2 diabetes mellitus with other circulatory complications: Secondary | ICD-10-CM | POA: Diagnosis not present

## 2019-04-25 DIAGNOSIS — I471 Supraventricular tachycardia: Secondary | ICD-10-CM | POA: Diagnosis not present

## 2019-04-25 HISTORY — DX: Obesity, class 2: E66.812

## 2019-04-25 HISTORY — DX: Morbid (severe) obesity due to excess calories: E66.01

## 2019-04-25 HISTORY — DX: Heart failure, unspecified: I50.9

## 2019-04-25 LAB — BASIC METABOLIC PANEL
Anion gap: 12 (ref 5–15)
BUN: 23 mg/dL (ref 8–23)
CO2: 25 mmol/L (ref 22–32)
Calcium: 9 mg/dL (ref 8.9–10.3)
Chloride: 104 mmol/L (ref 98–111)
Creatinine, Ser: 1 mg/dL (ref 0.61–1.24)
GFR calc Af Amer: >60 mL/min (ref >60)
GFR calc non Af Amer: >60 mL/min (ref >60)
Glucose, Bld: 138 mg/dL — ABNORMAL HIGH (ref 70–99)
Potassium: 4.7 mmol/L (ref 3.5–5.1)
Sodium: 141 mmol/L (ref 135–145)

## 2019-04-25 LAB — RAPID URINE DRUG SCREEN, HOSP PERFORMED
Amphetamines: NOT DETECTED
Barbiturates: NOT DETECTED
Benzodiazepines: NOT DETECTED
Cocaine: NOT DETECTED
Opiates: NOT DETECTED
Tetrahydrocannabinol: NOT DETECTED

## 2019-04-25 LAB — LIPID PANEL
Cholesterol: 156 mg/dL (ref 0–200)
HDL: 25 mg/dL — ABNORMAL LOW (ref >40)
LDL Cholesterol: 113 mg/dL — ABNORMAL HIGH (ref 0–99)
Total CHOL/HDL Ratio: 6.2 RATIO
Triglycerides: 88 mg/dL (ref <150)
VLDL: 18 mg/dL (ref 0–40)

## 2019-04-25 LAB — CBC
HCT: 50.9 % (ref 39.0–52.0)
Hemoglobin: 16.3 g/dL (ref 13.0–17.0)
MCH: 29 pg (ref 26.0–34.0)
MCHC: 32 g/dL (ref 30.0–36.0)
MCV: 90.4 fL (ref 80.0–100.0)
Platelets: 300 10*3/uL (ref 150–400)
RBC: 5.63 MIL/uL (ref 4.22–5.81)
RDW: 15.3 % (ref 11.5–15.5)
WBC: 13.3 10*3/uL — ABNORMAL HIGH (ref 4.0–10.5)
nRBC: 0 % (ref 0.0–0.2)

## 2019-04-25 LAB — TSH: TSH: 11.128 u[IU]/mL — ABNORMAL HIGH (ref 0.350–4.500)

## 2019-04-25 LAB — FERRITIN: Ferritin: 163 ng/mL (ref 24–336)

## 2019-04-25 LAB — HIV ANTIBODY (ROUTINE TESTING W REFLEX): HIV Screen 4th Generation wRfx: NONREACTIVE

## 2019-04-25 LAB — HEMOGLOBIN A1C
Hgb A1c MFr Bld: 6.7 % — ABNORMAL HIGH (ref 4.8–5.6)
Mean Plasma Glucose: 145.59 mg/dL

## 2019-04-25 LAB — TROPONIN I (HIGH SENSITIVITY)
Troponin I (High Sensitivity): 23 ng/L — ABNORMAL HIGH (ref <18)
Troponin I (High Sensitivity): 21 ng/L — ABNORMAL HIGH (ref <18)

## 2019-04-25 LAB — SARS CORONAVIRUS 2 (TAT 6-24 HRS): SARS Coronavirus 2: NEGATIVE

## 2019-04-25 LAB — BRAIN NATRIURETIC PEPTIDE: B Natriuretic Peptide: 2068.6 pg/mL — ABNORMAL HIGH (ref 0.0–100.0)

## 2019-04-25 MED ORDER — POTASSIUM CHLORIDE CRYS ER 20 MEQ PO TBCR
20.0000 meq | EXTENDED_RELEASE_TABLET | Freq: Every day | ORAL | Status: DC
  Administered 2019-04-25 – 2019-04-29 (×4): 20 meq via ORAL
  Filled 2019-04-25 (×5): qty 1

## 2019-04-25 MED ORDER — ONDANSETRON HCL 4 MG/2ML IJ SOLN: 4.0000 mg | Freq: Four times a day (QID) | INTRAMUSCULAR | Status: DC | PRN

## 2019-04-25 MED ORDER — SODIUM CHLORIDE 0.9% FLUSH: 3.0000 mL | INTRAVENOUS | Status: DC | PRN

## 2019-04-25 MED ORDER — SODIUM CHLORIDE 0.9 % IV SOLN: 250.0000 mL | INTRAVENOUS | Status: DC | PRN

## 2019-04-25 MED ORDER — FUROSEMIDE 10 MG/ML IJ SOLN
40.0000 mg | Freq: Once | INTRAMUSCULAR | Status: AC
  Administered 2019-04-25: 40 mg via INTRAVENOUS
  Filled 2019-04-25: qty 4

## 2019-04-25 MED ORDER — ONDANSETRON HCL 4 MG PO TABS: 4.0000 mg | ORAL_TABLET | Freq: Four times a day (QID) | ORAL | Status: DC | PRN

## 2019-04-25 MED ORDER — LISINOPRIL 10 MG PO TABS
5.0000 mg | ORAL_TABLET | Freq: Every day | ORAL | Status: DC
  Administered 2019-04-25: 5 mg via ORAL
  Filled 2019-04-25: qty 1

## 2019-04-25 MED ORDER — HYDROCODONE-ACETAMINOPHEN 5-325 MG PO TABS
1.0000 | ORAL_TABLET | ORAL | Status: DC | PRN
  Administered 2019-04-27: 1 via ORAL
  Filled 2019-04-25: qty 1

## 2019-04-25 MED ORDER — MORPHINE SULFATE (PF) 2 MG/ML IV SOLN: 2.0000 mg | INTRAVENOUS | Status: DC | PRN

## 2019-04-25 MED ORDER — FUROSEMIDE 10 MG/ML IJ SOLN
80.0000 mg | Freq: Two times a day (BID) | INTRAMUSCULAR | Status: DC
  Administered 2019-04-25 – 2019-04-30 (×10): 80 mg via INTRAVENOUS
  Filled 2019-04-25 (×10): qty 8

## 2019-04-25 MED ORDER — ENOXAPARIN SODIUM 40 MG/0.4ML ~~LOC~~ SOLN
40.0000 mg | SUBCUTANEOUS | Status: DC
  Administered 2019-04-25: 40 mg via SUBCUTANEOUS
  Filled 2019-04-25: qty 0.4

## 2019-04-25 MED ORDER — DOCUSATE SODIUM 100 MG PO CAPS
100.0000 mg | ORAL_CAPSULE | Freq: Two times a day (BID) | ORAL | Status: DC
  Administered 2019-04-30 – 2019-05-01 (×2): 100 mg via ORAL
  Filled 2019-04-25 (×7): qty 1

## 2019-04-25 MED ORDER — ALBUTEROL SULFATE (2.5 MG/3ML) 0.083% IN NEBU
5.0000 mg | INHALATION_SOLUTION | Freq: Once | RESPIRATORY_TRACT | Status: DC
  Filled 2019-04-25: qty 6

## 2019-04-25 MED ORDER — SODIUM CHLORIDE 0.9% FLUSH
3.0000 mL | Freq: Two times a day (BID) | INTRAVENOUS | Status: DC
  Administered 2019-04-26 – 2019-04-29 (×7): 3 mL via INTRAVENOUS

## 2019-04-25 MED ORDER — BISACODYL 5 MG PO TBEC: 5.0000 mg | DELAYED_RELEASE_TABLET | Freq: Every day | ORAL | Status: DC | PRN

## 2019-04-25 MED ORDER — POLYETHYLENE GLYCOL 3350 17 G PO PACK: 17.0000 g | PACK | Freq: Every day | ORAL | Status: DC | PRN

## 2019-04-25 MED ORDER — FUROSEMIDE 10 MG/ML IJ SOLN: 40.0000 mg | Freq: Two times a day (BID) | INTRAMUSCULAR | Status: DC

## 2019-04-25 MED ORDER — HYDRALAZINE HCL 20 MG/ML IJ SOLN: 5.0000 mg | INTRAMUSCULAR | Status: DC | PRN

## 2019-04-25 MED ORDER — ZOLPIDEM TARTRATE 5 MG PO TABS: 5.0000 mg | ORAL_TABLET | Freq: Every evening | ORAL | Status: DC | PRN

## 2019-04-25 MED ORDER — ACETAMINOPHEN 325 MG PO TABS
650.0000 mg | ORAL_TABLET | Freq: Four times a day (QID) | ORAL | Status: DC | PRN
  Administered 2019-04-25 – 2019-04-29 (×2): 650 mg via ORAL
  Filled 2019-04-25 (×2): qty 2

## 2019-04-25 MED ORDER — ACETAMINOPHEN 650 MG RE SUPP: 650.0000 mg | Freq: Four times a day (QID) | RECTAL | Status: DC | PRN

## 2019-04-25 MED ORDER — CARVEDILOL 12.5 MG PO TABS: 6.2500 mg | ORAL_TABLET | Freq: Two times a day (BID) | ORAL | Status: DC

## 2019-04-25 MED ORDER — LOSARTAN POTASSIUM 50 MG PO TABS
50.0000 mg | ORAL_TABLET | Freq: Every day | ORAL | Status: DC
  Administered 2019-04-25: 50 mg via ORAL
  Filled 2019-04-25: qty 1

## 2019-04-25 MED ORDER — ASPIRIN EC 81 MG PO TBEC
81.0000 mg | DELAYED_RELEASE_TABLET | Freq: Every day | ORAL | Status: DC
  Administered 2019-04-25 – 2019-05-01 (×3): 81 mg via ORAL
  Filled 2019-04-25 (×6): qty 1

## 2019-04-25 NOTE — Consult Note (Signed)
Cardiology Consultation:   Patient ID: Blake West MRN: [id number]; DOB: 1950/07/04  Admit date: 04/25/2019 Date of Consult: 04/25/2019  Primary Care Provider: Patient, No Pcp Per Primary Cardiologist: Harrington Challenger (last seen 2016)  Patient Profile:   Blake West is a 69 y.o. male with a hx of palpitations, HTN who is being seen today for the evaluation of SOB  at the request of Dr Lorin Mercy   History of Present Illness:   Blake West is a 69 yo with hx of  palpitations, HTN, OSA (on CPAP)    I saw him for the only time in Feb 2016   He was referred by PCP   EKG showed SR with PACs, Possible PVCs  He was asymptomatic    Recomm a holter monitor This showed rare PVCs  Average HR when wearing the monitor was 102 bpm    The pt presents to ER with SOB and LE edema   Cough nonproductive   + wheezing. + orthopnea  The pt says he has been SOB with exertion for the past few wks   Says 6 months ago felt OK   Denies ever having CP    Also notes LE edema  Deneis palpitations   SOme dizziness  No syncope  No recent viral infection   He is taking herbals   No meds   Does not have a PCP  (fired)  Attributes feeling bad to just getting old   Note brother recently admitted with CHF  "took 60lbs off"  Past Medical History:  Diagnosis Date  . Abnormal EKG   . Gout   . Hypertension   . Kidney stone   . Pneumonia   . Testicular hypofunction   . Thyroid disease     Past Surgical History:  Procedure Laterality Date  . FOOT SURGERY         Inpatient Medications: Scheduled Meds:  Continuous Infusions:  PRN Meds:   Allergies:    Allergies  Allergen Reactions  . Codeine     Joints hurt     Social History:   Social History   Socioeconomic History  . Marital status: Single    Spouse name: Not on file  . Number of children: Not on file  . Years of education: Not on file  . Highest education level: Not on file  Occupational History  . Occupation: Chief Financial Officer  Tobacco Use  .  Smoking status: Former Smoker    Quit date: 01/24/2002    Years since quitting: 17.2  . Smokeless tobacco: Never Used  Substance and Sexual Activity  . Alcohol use: No  . Drug use: No  . Sexual activity: Not on file  Other Topics Concern  . Not on file  Social History Narrative  . Not on file   Social Determinants of Health   Financial Resource Strain:   . Difficulty of Paying Living Expenses:   Food Insecurity:   . Worried About Charity fundraiser in the Last Year:   . Arboriculturist in the Last Year:   Transportation Needs:   . Film/video editor (Medical):   Marland Kitchen Lack of Transportation (Non-Medical):   Physical Activity:   . Days of Exercise per Week:   . Minutes of Exercise per Session:   Stress:   . Feeling of Stress :   Social Connections:   . Frequency of Communication with Friends and Family:   . Frequency of Social Gatherings with Friends and Family:   .  Attends Religious Services:   . Active Member of Clubs or Organizations:   . Attends Archivist Meetings:   Marland Kitchen Marital Status:   Intimate Partner Violence:   . Fear of Current or Ex-Partner:   . Emotionally Abused:   Marland Kitchen Physically Abused:   . Sexually Abused:     Family History:    Family History  Problem Relation Age of Onset  . Heart attack Mother 26  . Pulmonary fibrosis Father    Brother with CHF   ROS:  Please see the history of present illness.   All other ROS reviewed and negative.     Physical Exam/Data:   Vitals:   04/25/19 1349 04/25/19 1430 04/25/19 1557 04/25/19 1600  BP: (!) 156/112 (!) 147/120 (!) 140/106 (!) 133/101  Pulse: (!) 106 (!) 102  94  Resp: 20 (!) 22  (!) 24  Temp: (!) 97.4 F (36.3 C)     TempSrc: Oral     SpO2: 93% 100%  98%  Weight: 117.9 kg     Height: 5\' 11"  (1.803 m)      No intake or output data in the 24 hours ending 04/25/19 1641 Last 3 Weights 04/25/2019 09/17/2015 03/07/2014  Weight (lbs) 260 lb 260 lb 8 oz 240 lb  Weight (kg) 117.935 kg 118.162  kg 108.863 kg     Body mass index is 36.26 kg/m.  General:  Obese 69 yo in no acute distress HEENT: normal Lymph: no adenopathy Neck: JVP is increased  No bruits    Vascular: No carotid bruits; FA pulses 2+ bilaterally without bruits  Cardiac:  normal S1, S2;   + S3  RRR; no murmur  Lungs:  Rales at bases to 1/3 up   Abd: soft, nontender  Distended    Ext: 3-3+ edema Musculoskeletal:  No deformities, BUE and BLE strength normal and equal Skin: warm and dry Erythema in legs, mild weepign  Neuro:  CNs 2-12 intact, no focal abnormalities noted Psych:  Normal affect   EKG:  The EKG was personally reviewed and demonstrates:    ST 103 bpm  First degree AV block  PR 210 msec   Occasional PVC  Anteroseptal  MI  Poss Lateral MI  Telemetry:  Telemetry was personally reviewed and demonstrates:  SR   Relevant CV Studies: Echo ordered   Laboratory Data:  High Sensitivity Troponin:   Recent Labs  Lab 04/25/19 1404  TROPONINIHS 23*     Chemistry Recent Labs  Lab 04/25/19 1404  NA 141  K 4.7  CL 104  CO2 25  GLUCOSE 138*  BUN 23  CREATININE 1.00  CALCIUM 9.0  GFRNONAA >60  GFRAA >60  ANIONGAP 12    No results for input(s): PROT, ALBUMIN, AST, ALT, ALKPHOS, BILITOT in the last 168 hours. Hematology Recent Labs  Lab 04/25/19 1404  WBC 13.3*  RBC 5.63  HGB 16.3  HCT 50.9  MCV 90.4  MCH 29.0  MCHC 32.0  RDW 15.3  PLT 300   BNP Recent Labs  Lab 04/25/19 1416  BNP 2,068.6*    DDimer No results for input(s): DDIMER in the last 168 hours.   Radiology/Studies:  DG Chest 2 View  Result Date: 04/25/2019 CLINICAL DATA:  Shortness of breath EXAM: CHEST - 2 VIEW COMPARISON:  04/17/2019 FINDINGS: Mild cardiomegaly. Similar appearance of diffuse bilateral interstitial pulmonary opacity, likely with trace bilateral pleural effusions. No new or focal airspace opacity. IMPRESSION: Similar appearance of diffuse bilateral interstitial pulmonary opacity,  likely with trace  bilateral pleural effusions. No new or focal airspace opacity. Findings are consistent with atypical/viral infection or edema. Electronically Signed   By: Eddie Candle M.D.   On: 04/25/2019 14:53         Assessment and Plan:   1  CHF   Exam with significant volume overload   EKG with Q waves anteriorly susp for MI but pt has not had CP     Sounds like brother has CHF Check thyroid, check Ferritin \ Agree with diuresis  Would continue.  Follow renal function  Would add ARB  BP control/afterload reduction   Would not start lisinopril   I am not sure he would agree with Delene Loll but keep on ARB HOld b blocker for now until acute CHF exacerbation improves  Agree with echocardiogram to eval LV /RV size / function Dietary consult to discuss low Na diet    2  HTN  BP severely elevated  SHould improve some with diuresis   Follow  Meds as noted above  3  HCM  Check lipids    For questions or updates, please contact [facility] Please consult [email or web address] for contact info under     Signed, Dorris Carnes, MD  04/25/2019 4:41 PM

## 2019-04-25 NOTE — ED Triage Notes (Signed)
Pt here with co sob for 2 weeks on and off. Pt seen at [facility] on 04/17/19. Pt state hx of PNA every year. Pt states he doesn't have home o2 but does use a cpap to sleep with. Denies cough and congestion at this time. Denies pain.

## 2019-04-25 NOTE — ED Notes (Signed)
Pt arrived to [facility] via stretcher - Monitor intact to pt - O2 infusing @ 2L/min. Pt states he feels he is breathing better. States he feels he needs to sleep as he has not been able to sleep well the past few nights. TV turned on for pt and remote given. Pt also has urinal at bedside.

## 2019-04-25 NOTE — ED Notes (Addendum)
Dinner tray ordered for pt. When pt used urinal, some urine was noted on floor d/t pt advised he "forgot to remove the lid". Pt spoke w/spouse who advised she is planning to come visit w/pt.

## 2019-04-25 NOTE — ED Provider Notes (Signed)
[facility] EMERGENCY DEPARTMENT Provider Note   CSN: [id number] Arrival date & time: 04/25/19  1347     History Chief Complaint  Patient presents with  . Shortness of Breath    Blake West is a 69 y.o. male.  HPI 69 year old male presents with shortness of breath.  Ongoing for about 2 weeks.  Also cough with some white sputum.  This feels like the pneumonia he typically gets every year or so.  No fever.  Has had leg swelling bilaterally as well as abdominal swelling.  No fevers or chest pain.  He is not normally on oxygen. No known history of CHF. Has orthopnea.   Past Medical History:  Diagnosis Date  . Abnormal EKG   . Gout   . Hypertension   . Kidney stone   . Pneumonia   . Testicular hypofunction   . Thyroid disease     There are no problems to display for this patient.   Past Surgical History:  Procedure Laterality Date  . FOOT SURGERY         Family History  Problem Relation Age of Onset  . Heart attack Mother     Social History   Tobacco Use  . Smoking status: Former Smoker    Quit date: 01/24/2002    Years since quitting: 17.2  . Smokeless tobacco: Never Used  Substance Use Topics  . Alcohol use: No  . Drug use: No    Home Medications Prior to Admission medications   Medication Sig Start Date End Date Taking? Authorizing Provider  Ascorbic Acid (VITAMIN C) 100 MG tablet Take 500 mg by mouth daily.   Yes [provider]  cholecalciferol (VITAMIN D) 400 UNITS TABS tablet Take 400 Units by mouth 3 (three) times daily.    Yes [provider]  Multiple Vitamin (THERA) TABS Take 1 tablet by mouth daily.   Yes [provider]  NON FORMULARY Take 1 capsule by mouth with breakfast, with lunch, and with evening meal. Alphalplex   Yes [provider]  OVER THE COUNTER MEDICATION Thyroid otc medicine Simplex m   Yes [provider]  Vitamin E 400 units TABS Take 400 Units by mouth 2 (two)  times daily.    Yes [provider]    Allergies    Codeine  Review of Systems   Review of Systems  Constitutional: Negative for fever.  Respiratory: Positive for cough and shortness of breath.   Cardiovascular: Positive for leg swelling. Negative for chest pain.  Gastrointestinal: Negative for abdominal pain.  All other systems reviewed and are negative.   Physical Exam Updated Vital Signs BP (!) 156/112 (BP Location: Left Arm)   Pulse (!) 106   Temp (!) 97.4 F (36.3 C) (Oral)   Resp 20   Ht 5\' 11"  (1.803 m)   Wt 117.9 kg   SpO2 93%   BMI 36.26 kg/m   Physical Exam Vitals and nursing note reviewed.  Constitutional:      General: He is not in acute distress.    Appearance: He is well-developed. He is obese.  HENT:     Head: Normocephalic and atraumatic.     Right Ear: External ear normal.     Left Ear: External ear normal.     Nose: Nose normal.  Eyes:     General:        Right eye: No discharge.        Left eye: No discharge.  Cardiovascular:     Rate and Rhythm: Normal rate and regular rhythm.     Heart sounds: Normal heart sounds.  Pulmonary:     Effort: Pulmonary effort is normal. Tachypnea present. No accessory muscle usage or respiratory distress.     Breath sounds: Examination of the right-lower field reveals rales. Examination of the left-lower field reveals rales. Rales present.  Abdominal:     Palpations: Abdomen is soft.     Tenderness: There is no abdominal tenderness.  Musculoskeletal:     Cervical back: Neck supple.     Right lower leg: Edema present.     Left lower leg: Edema present.     Comments: Significant pitting edema of bilateral feet/ankles, lower legs. Stasis dermatitis as well.   Skin:    General: Skin is warm and dry.  Neurological:     Mental Status: He is alert.  Psychiatric:        Mood and Affect: Mood is not anxious.     ED Results / Procedures / Treatments   Labs (all labs ordered are listed, but only  abnormal results are displayed) Labs Reviewed  BASIC METABOLIC PANEL - Abnormal; Notable for the following components:      Result Value   Glucose, Bld 138 (*)    All other components within normal limits  CBC - Abnormal; Notable for the following components:   WBC 13.3 (*)    All other components within normal limits  BRAIN NATRIURETIC PEPTIDE - Abnormal; Notable for the following components:   B Natriuretic Peptide 2,068.6 (*)    All other components within normal limits  TROPONIN I (HIGH SENSITIVITY) - Abnormal; Notable for the following components:   Troponin I (High Sensitivity) 23 (*)    All other components within normal limits  SARS CORONAVIRUS 2 (TAT 6-24 HRS)  TROPONIN I (HIGH SENSITIVITY)    EKG EKG Interpretation  Date/Time:  Thursday April 25 2019 15:07:30 EDT Ventricular Rate:  103 PR Interval:    QRS Duration: 84 QT Interval:  355 QTC Calculation: 465 R Axis:   104 Text Interpretation: Sinus tachycardia Paired ventricular premature complexes Prolonged PR interval Anterior infarct, old Nonspecific T abnormalities, lateral leads similar to earlier in the day Confirmed by Sherwood Gambler [phone]) on 04/25/2019 3:32:38 PM   Radiology DG Chest 2 View  Result Date: 04/25/2019 CLINICAL DATA:  Shortness of breath EXAM: CHEST - 2 VIEW COMPARISON:  04/17/2019 FINDINGS: Mild cardiomegaly. Similar appearance of diffuse bilateral interstitial pulmonary opacity, likely with trace bilateral pleural effusions. No new or focal airspace opacity. IMPRESSION: Similar appearance of diffuse bilateral interstitial pulmonary opacity, likely with trace bilateral pleural effusions. No new or focal airspace opacity. Findings are consistent with atypical/viral infection or edema. Electronically Signed   By: Eddie Candle M.D.   On: 04/25/2019 14:53    Procedures Procedures (including critical care time)  Medications Ordered in ED Medications  furosemide (LASIX) injection 40 mg (has no  administration in time range)    ED Course  I have reviewed the triage vital signs and the nursing notes.  Pertinent labs & imaging results that were available during my care of the patient were reviewed by me and considered in my medical decision making (see chart for details).    MDM Rules/Calculators/A&P                      Patient patient's work-up is consistent with congestive heart failure.  This appears to be a new  diagnosis for him.  He is not in distress but he was ambulated without oxygen and he desatted to about 87% and was quite short of breath and his heart rate went up to 135.  I think it would be better for him to be admitted for diuresis and work-up including echo.  I think this is less likely to be infectious.  Will send Covid testing as screening but this is more likely CHF. Dr. Lorin Mercy to admit.  Blake West was evaluated in Emergency Department on 04/25/2019 for the symptoms described in the history of present illness. He was evaluated in the context of the global COVID-19 pandemic, which necessitated consideration that the patient might be at risk for infection with the SARS-CoV-2 virus that causes COVID-19. Institutional protocols and algorithms that pertain to the evaluation of patients at risk for COVID-19 are in a state of rapid change based on information released by regulatory bodies including the CDC and federal and state organizations. These policies and algorithms were followed during the patient's care in the ED.  Final Clinical Impression(s) / ED Diagnoses Final diagnoses:  Acute congestive heart failure, unspecified heart failure type [facility])    Rx / DC Orders ED Discharge Orders    None       Sherwood Gambler, MD 04/25/19 1600

## 2019-04-25 NOTE — Telephone Encounter (Signed)
Patient was last seen 2016 by Volney Presser who is no longer a provider at [facility].  Patient wife called stating that the patient has pneumonia with COPD and that he was having trouble breathing and that he has been suffering from it for 2 weeks and now he needs a oxygen tank. Patient wife states that they have not been to the hospital because she did not want him to catch anything there. I asked the patient wife has he taken a COVID test and she said " he is not going to take one because they do not trust the nose swab" after looking further in the chart patient has not been here since 2016 and his is no longer in our system as a patient here. After advising to the patient wife he is no longer a patient here she hung up on me.

## 2019-04-25 NOTE — ED Notes (Signed)
Pt ambulated with oximetry,after walk 40ft without oxygen, using a walker without assistance  pt felt:"Completly out of breath", oxygen saturation dropped to 88%, and requested oxygen after sitting down. Oxygen saturation came back to 100 after being sit for a 30 seconds

## 2019-04-25 NOTE — H&P (Addendum)
History and Physical    Blake West BMW:[id number] DOB: 1951/01/11 DOA: 04/25/2019  PCP: Patient, No Pcp Per - has not seen PCP in 5 years Consultants:  None Patient coming from:  Home - lives with wife; NOK: Wife, Blake West, [place]  Chief Complaint: SOB  HPI: Blake West is a 69 y.o. male with medical history significant of hypothyroidism; OSA on CPAP; and HTN presenting with SOB.  He reports LE edema.  He thought he had pneumonia.  He was SOB, coughing mostly nonproductive.  No chest pain.  He has had wheezing.  No fevers.  +orthopnea - "I haven't been able to do that since I was a kid."  +PND.  He uses a CPAP at night.  SOB is worse with exertion.  He is hoping that we are able to improve his symptoms overnight and discharge him to home tomorrow.  He is uncertain if he will be willing to take medications at home.  His wife is Native Optometrist and a Theatre stage manager with a degree in herbatology and they prefer a holistic medicine approach.   ED Course: New-onset CHF - significant edema, markedly elevated BNP.  +cough, SOB.  87% with ambulation.  Review of Systems: As per HPI; otherwise review of systems reviewed and negative.   Ambulatory Status:  Ambulates without assistance  COVID Vaccine Status:  None  Past Medical History:  Diagnosis Date  . Abnormal EKG   . Gout   . Hypertension   . Kidney stone   . Pneumonia   . Testicular hypofunction   . Thyroid disease     Past Surgical History:  Procedure Laterality Date  . FOOT SURGERY      Social History   Socioeconomic History  . Marital status: Single    Spouse name: Not on file  . Number of children: Not on file  . Years of education: Not on file  . Highest education level: Not on file  Occupational History  . Occupation: Chief Financial Officer  Tobacco Use  . Smoking status: Former Smoker    Quit date: 01/24/2002    Years since quitting: 17.2  . Smokeless tobacco: Never Used  Substance and Sexual Activity  .  Alcohol use: No  . Drug use: No  . Sexual activity: Not on file  Other Topics Concern  . Not on file  Social History Narrative  . Not on file   Social Determinants of Health   Financial Resource Strain:   . Difficulty of Paying Living Expenses:   Food Insecurity:   . Worried About Charity fundraiser in the Last Year:   . Arboriculturist in the Last Year:   Transportation Needs:   . Film/video editor (Medical):   Marland Kitchen Lack of Transportation (Non-Medical):   Physical Activity:   . Days of Exercise per Week:   . Minutes of Exercise per Session:   Stress:   . Feeling of Stress :   Social Connections:   . Frequency of Communication with Friends and Family:   . Frequency of Social Gatherings with Friends and Family:   . Attends Religious Services:   . Active Member of Clubs or Organizations:   . Attends Archivist Meetings:   Marland Kitchen Marital Status:   Intimate Partner Violence:   . Fear of Current or Ex-Partner:   . Emotionally Abused:   Marland Kitchen Physically Abused:   . Sexually Abused:     Allergies  Allergen Reactions  . Codeine  Joints hurt     Family History  Problem Relation Age of Onset  . Heart attack Mother 32  . Pulmonary fibrosis Father   . Congestive Heart Failure Brother     Prior to Admission medications   Medication Sig Start Date End Date Taking? Authorizing Provider  Ascorbic Acid (VITAMIN C) 100 MG tablet Take 500 mg by mouth daily.   Yes [provider]  cholecalciferol (VITAMIN D) 400 UNITS TABS tablet Take 400 Units by mouth 3 (three) times daily.    Yes [provider]  Multiple Vitamin (THERA) TABS Take 1 tablet by mouth daily.   Yes [provider]  NON FORMULARY Take 1 capsule by mouth with breakfast, with lunch, and with evening meal. Alphalplex   Yes [provider]  OVER THE COUNTER MEDICATION Thyroid otc medicine Simplex m   Yes [provider]  Vitamin E 400 units TABS Take 400 Units by  mouth 2 (two) times daily.    Yes [provider]    Physical Exam: Vitals:   04/25/19 1600 04/25/19 1714 04/25/19 1725 04/25/19 1739  BP: (!) 133/101 (!) 135/93 (!) 135/93   Pulse: 94  92   Resp: (!) 24  (!) 22   Temp:    98.2 F (36.8 C)  TempSrc:    Oral  SpO2: 98%  100%   Weight:      Height:         . General:  Appears calm and comfortable and is NAD; he has tachypnea with any exertion . Eyes:  PERRL, EOMI, normal lids, iris . ENT:  grossly normal hearing, lips & tongue, mmm . Neck:  no LAD, masses or thyromegaly; mild JVD . Cardiovascular:  RRR, no m/r/g. 3+ LE edema.  Marland Kitchen Respiratory:   Crackles extend 1/2 to 2/3 up B lung fields.  Mildly increased respiratory effort, on [place] O2 . Abdomen:  soft, NT, ND, NABS . Back:   normal alignment, no CVAT . Skin:  no rash or induration seen on limited exam . Musculoskeletal:  grossly normal tone BUE/BLE, good ROM, no bony abnormality . Psychiatric:  grossly normal mood and affect, speech fluent and appropriate, AOx3 . Neurologic:  CN 2-12 grossly intact, moves all extremities in coordinated fashion    Radiological Exams on Admission: DG Chest 2 View  Result Date: 04/25/2019 CLINICAL DATA:  Shortness of breath EXAM: CHEST - 2 VIEW COMPARISON:  04/17/2019 FINDINGS: Mild cardiomegaly. Similar appearance of diffuse bilateral interstitial pulmonary opacity, likely with trace bilateral pleural effusions. No new or focal airspace opacity. IMPRESSION: Similar appearance of diffuse bilateral interstitial pulmonary opacity, likely with trace bilateral pleural effusions. No new or focal airspace opacity. Findings are consistent with atypical/viral infection or edema. Electronically Signed   By: Eddie Candle M.D.   On: 04/25/2019 14:53    EKG: Independently reviewed.   1349 - Sinus tachcardia with rate 103; prolonged QTc 670; nonspecific ST changes concerning for ischemia 1507 - Sinus tachcardia with rate 103; PVCs; normal QTc 465;  nonspecific ST changes concerning for ischemia    Labs on Admission: I have personally reviewed the available labs and imaging studies at the time of the admission.  Pertinent labs:   Glucose 138 BNP 2068.6 HS troponin 23, 21 WBC 13.3   Assessment/Plan Principal Problem:   New onset of congestive heart failure (HCC) Active Problems:   Essential hypertension   OSA on CPAP   Class 2 severe obesity due to excess calories with serious  comorbidity and body mass index (BMI) of 36.0 to 36.9 in adult [facility])    New onset CHF -Patient who does not seen physicians due to his preference for a naturalistic lifestyle presenting with marked LE edema, orthopnea, PND, cough, and DOE -He was seen with a similar presentation on 3/24 and thought to have acute CHF but patient/wife declined ER evaluation/admission -Apparently, his natural supplements did not work and he presented today with worsening symptoms -CXR consistent with pulmonary edema -Mildly elevated BNP without known baseline -With elevated BNP and abnl CXR, acute decompensated CHF seems probable as diagnosis -Will admit, as per the Emergency HF Mortality Risk Grade.  The patient has:  severe pulmonary edema requiring new O2 therapy (currently on 2L [place] O2) -Patient meets NYHA Class 3 functional classification at this time (marked limitation in activity due to symptoms, being comfortable only at rest).   -Will request echocardiogram -Will start ASA -Will start Lisinopril 5 mg daily (BP is normal to high and so should support addition of medication) -Start Coreg once acute decompensation improves -CHF order set utilized -Cardiology consulted -Was given Lasix 40 mg x 1 in ER and will repeat with 40 mg IV BID -Continue  O2 for now -Normal kidney function at this time, will follow -Mildly elevated HS troponin is likely related to demand ischemia; doubt ACS based on symptoms -Will perform risk factor analysis with FLP, A1c; will also  check TSH and UDS -Will empirically start Lipitor 20 mg daily  HTN -On holistic medications only at this time -Patient with suboptimal control while in the ER -Will add Lisinopril 5 mg for now and suggest initiation of Coreg when able to tolerate -Will also add prn IV hydralazine  Severe obesity -Body mass index is 36.26 kg/m. -Weight loss should be encouraged -Outpatient PCP/bariatric medicine/bariatric surgery f/u encouraged  OSA -Continue CPAP    Note: This patient has been tested and is pending for the novel coronavirus COVID-19.  DVT prophylaxis: Lovenox  Code Status:  DNR - confirmed with patient Family Communication: None present Disposition Plan:  Home once clinically improved Consults called: Cardiology; PT/Nutrition/TOC team  Admission status: Admit - It is my clinical opinion that admission to INPATIENT is reasonable and necessary because this patient will require at least 2 midnights in the hospital to treat this condition based on the medical complexity of the problems presented.  Given the aforementioned information, the predictability of an adverse outcome is felt to be significant.    Karmen Bongo MD [facility]   How to contact the [facility] Attending or Consulting provider [place] or covering provider during after hours [place], for this patient?  1. Check the care team in [facility] and look for a) attending/consulting [facility] provider listed and b) the [facility] team listed 2. Log into [email or web address] and use [place]'s universal password to access. If you do not have the password, please contact the hospital operator. 3. Locate the [facility] provider you are looking for under [facility] and page to a number that you can be directly reached. 4. If you still have difficulty reaching the provider, please page the [facility] (Director on Call) for the Hospitalists listed on amion for assistance.   04/25/2019, 5:47 PM

## 2019-04-25 NOTE — ED Notes (Signed)
Pt complaining of cramps in hands and legs

## 2019-04-25 NOTE — ED Notes (Signed)
MD in w pt

## 2019-04-25 NOTE — ED Notes (Signed)
Spouse has arrived and is at bedside. Aware waiting for bed assignment.

## 2019-04-26 ENCOUNTER — Inpatient Hospital Stay (HOSPITAL_COMMUNITY): Payer: PPO

## 2019-04-26 ENCOUNTER — Encounter (HOSPITAL_COMMUNITY): Payer: Self-pay | Admitting: Internal Medicine

## 2019-04-26 DIAGNOSIS — I5021 Acute systolic (congestive) heart failure: Secondary | ICD-10-CM | POA: Diagnosis present

## 2019-04-26 DIAGNOSIS — E785 Hyperlipidemia, unspecified: Secondary | ICD-10-CM | POA: Diagnosis present

## 2019-04-26 DIAGNOSIS — E119 Type 2 diabetes mellitus without complications: Secondary | ICD-10-CM

## 2019-04-26 DIAGNOSIS — I340 Nonrheumatic mitral (valve) insufficiency: Secondary | ICD-10-CM

## 2019-04-26 HISTORY — DX: Type 2 diabetes mellitus without complications: E11.9

## 2019-04-26 LAB — CBC WITH DIFFERENTIAL/PLATELET
Abs Immature Granulocytes: 0.03 10*3/uL (ref 0.00–0.07)
Basophils Absolute: 0.1 10*3/uL (ref 0.0–0.1)
Basophils Relative: 1 %
Eosinophils Absolute: 0.5 10*3/uL (ref 0.0–0.5)
Eosinophils Relative: 4 %
HCT: 47.2 % (ref 39.0–52.0)
Hemoglobin: 15.3 g/dL (ref 13.0–17.0)
Immature Granulocytes: 0 %
Lymphocytes Relative: 27 %
Lymphs Abs: 3.3 10*3/uL (ref 0.7–4.0)
MCH: 28.9 pg (ref 26.0–34.0)
MCHC: 32.4 g/dL (ref 30.0–36.0)
MCV: 89.2 fL (ref 80.0–100.0)
Monocytes Absolute: 1.1 10*3/uL — ABNORMAL HIGH (ref 0.1–1.0)
Monocytes Relative: 9 %
Neutro Abs: 7.2 10*3/uL (ref 1.7–7.7)
Neutrophils Relative %: 59 %
Platelets: 284 10*3/uL (ref 150–400)
RBC: 5.29 MIL/uL (ref 4.22–5.81)
RDW: 15.2 % (ref 11.5–15.5)
WBC: 12.2 10*3/uL — ABNORMAL HIGH (ref 4.0–10.5)
nRBC: 0 % (ref 0.0–0.2)

## 2019-04-26 LAB — BASIC METABOLIC PANEL
Anion gap: 11 (ref 5–15)
BUN: 26 mg/dL — ABNORMAL HIGH (ref 8–23)
CO2: 26 mmol/L (ref 22–32)
Calcium: 8.3 mg/dL — ABNORMAL LOW (ref 8.9–10.3)
Chloride: 103 mmol/L (ref 98–111)
Creatinine, Ser: 1.17 mg/dL (ref 0.61–1.24)
GFR calc Af Amer: >60 mL/min (ref >60)
GFR calc non Af Amer: >60 mL/min (ref >60)
Glucose, Bld: 107 mg/dL — ABNORMAL HIGH (ref 70–99)
Potassium: 4.1 mmol/L (ref 3.5–5.1)
Sodium: 140 mmol/L (ref 135–145)

## 2019-04-26 LAB — ECHOCARDIOGRAM COMPLETE
Height: 71 in
Weight: 4076.8 oz

## 2019-04-26 LAB — GLUCOSE, CAPILLARY: Glucose-Capillary: 103 mg/dL — ABNORMAL HIGH (ref 70–99)

## 2019-04-26 MED ORDER — INSULIN ASPART 100 UNIT/ML ~~LOC~~ SOLN: 0.0000 [IU] | Freq: Every day | SUBCUTANEOUS | Status: DC

## 2019-04-26 MED ORDER — ATORVASTATIN CALCIUM 40 MG PO TABS
40.0000 mg | ORAL_TABLET | Freq: Every day | ORAL | Status: DC
  Administered 2019-04-26: 40 mg via ORAL
  Filled 2019-04-26 (×2): qty 1

## 2019-04-26 MED ORDER — LOSARTAN POTASSIUM 25 MG PO TABS
25.0000 mg | ORAL_TABLET | Freq: Every day | ORAL | Status: DC
  Filled 2019-04-26 (×2): qty 1

## 2019-04-26 MED ORDER — INSULIN ASPART 100 UNIT/ML ~~LOC~~ SOLN: 0.0000 [IU] | Freq: Three times a day (TID) | SUBCUTANEOUS | Status: DC

## 2019-04-26 NOTE — Progress Notes (Signed)
  Echocardiogram 2D Echocardiogram has been performed.  Bobbye Charleston 04/26/2019, 11:56 AM

## 2019-04-26 NOTE — Progress Notes (Signed)
Spoke to patient about using CPAP here at the hospital.  Patient did not seem like he wanted to be disturbed to discuss it, seemed tired.  I explained that he could have a family member bring his from home if he would like, or he could use one of ours.  He stated that he did not want to deal with that tonight if he didn't have to.  No distress noted, will continue to monitor.

## 2019-04-26 NOTE — Discharge Instructions (Addendum)
Heart Failure, Diagnosis  Heart failure is a condition in which the heart has trouble pumping blood because it has become weak or stiff. This means that the heart does not pump blood well enough for the body to stay healthy. For some people with heart failure, fluid may back up into the lungs. There may also be swelling (edema) in the lower legs. Heart failure is usually a long-term (chronic) condition. It is important for you to take good care of yourself and follow the treatment plan from your health care provider. What are the causes? This condition may be caused by:  High blood pressure (hypertension). Hypertension causes the heart muscle to work harder than normal. This makes the heart stiff or weak.  Coronary artery disease, or CAD. CAD is the buildup of cholesterol and fat (plaque) in the arteries of the heart.  Heart attack, also called myocardial infarction. This injures the heart muscle, making it hard for the heart to pump blood.  Abnormal heart valves. The valves do not open and close properly, forcing the heart to pump harder to keep the blood flowing.  Heart muscle disease (cardiomyopathy or myocarditis). This is damage to the heart muscle. It can increase the risk of heart failure.  Lung disease. The heart works harder when the lungs are not healthy.  Abnormal heart rhythms. These can lead to heart failure. What increases the risk? The risk of heart failure increases as a person ages. This condition is also more likely to develop in people who:  Are overweight.  Are male.  Smoke or chew tobacco.  Abuse alcohol or illegal drugs.  Have taken medicines that can damage the heart, such as chemotherapy drugs.  Have diabetes.  Have abnormal heart rhythms.  Have thyroid problems.  Have low blood counts (anemia). What are the signs or symptoms? Symptoms of this condition include:  Shortness of breath with activity, such as when climbing stairs.  A cough that does  not go away.  Swelling of the feet, ankles, legs, or abdomen.  Losing weight for no reason.  Trouble breathing when lying flat (orthopnea).  Waking from sleep because of the need to sit up and get more air.  Rapid heartbeat.  Tiredness (fatigue) and loss of energy.  Feeling light-headed, dizzy, or close to fainting.  Loss of appetite.  Nausea.  Waking up more often during the night to urinate (nocturia).  Confusion. How is this diagnosed? This condition is diagnosed based on:  Your medical history, symptoms, and a physical exam.  Diagnostic tests, which may include: ? Echocardiogram. ? Electrocardiogram (ECG). ? Chest X-ray. ? Blood tests. ? Exercise stress test. ? Radionuclide scans. ? Cardiac catheterization and angiogram. How is this treated? Treatment for this condition is aimed at managing the symptoms of heart failure. Medicines Treatment may include medicines that:  Help lower blood pressure by relaxing (dilating) the blood vessels. These medicines are called ACE inhibitors (angiotensin-converting enzyme) and ARBs (angiotensin receptor blockers).  Cause the kidneys to remove salt and water from the blood through urination (diuretics).  Improve heart muscle strength and prevent the heart from beating too fast (beta blockers).  Increase the force of the heartbeat (digoxin). Healthy behavior changes     Treatment may also include making healthy lifestyle changes, such as:  Reaching and staying at a healthy weight.  Quitting smoking or chewing tobacco.  Eating heart-healthy foods.  Limiting or avoiding alcohol.  Stopping the use of illegal drugs.  Being physically active.  Other  treatments Other treatments may include:  Procedures to open blocked arteries or repair damaged valves.  Placing a pacemaker to improve heart function (cardiac resynchronization therapy).  Placing a device to treat serious abnormal heart rhythms (implantable  cardioverter defibrillator, or ICD).  Placing a device to improve the pumping ability of the heart (left ventricular assist device, or LVAD).  Receiving a healthy heart from a donor (heart transplant). This is done when other treatments have not helped. Follow these instructions at home:  Manage other health conditions as told by your health care provider. These may include hypertension, diabetes, thyroid disease, or abnormal heart rhythms.  Get ongoing education and support as needed. Learn as much as you can about heart failure.  Keep all follow-up visits as told by your health care provider. This is important. Summary  Heart failure is a condition in which the heart has trouble pumping blood because it has become weak or stiff.  This condition is caused by high blood pressure and other diseases of the heart and lungs.  Symptoms of this condition include shortness of breath, tiredness (fatigue), nausea, and swelling of the feet, ankles, legs, or abdomen.  Treatments for this condition may include medicines, lifestyle changes, and surgery.  Manage other health conditions as told by your health care provider. This information is not intended to replace advice given to you by your health care provider. Make sure you discuss any questions you have with your health care provider. Document Revised: 03/30/2018 Document Reviewed: 03/30/2018 Elsevier Patient Education  [place].   Heart Failure, Self Care Heart failure is a serious condition. This document explains the things you need to do to take care of yourself after a heart failure diagnosis. You may be asked to change your diet, take certain medicines, and make other lifestyle changes in order to stay as healthy as possible. Your health care provider may also give you more specific instructions. If you have problems or questions, contact your health care provider. What are the risks? Having heart failure puts you at higher  risk for certain problems. These problems can get worse if you do not take good care of yourself. Problems may include:  Blood clotting problems. This may cause a stroke.  Damage to the kidneys, liver, or lungs.  Abnormal heart rhythms. Supplies needed:  Scale for monitoring weight.  Blood pressure monitor.  Notebook.  Medicines. How to care for yourself when you have heart failure Medicines Take over-the-counter and prescription medicines only as told by your health care provider. Medicines reduce the workload of your heart, slow the progression of heart failure, and improve symptoms. Take your medicines every day.  Do not stop taking your medicine unless your health care provider tells you to do so.  Do not skip any dose of medicine.  Refill your prescriptions before you run out of medicine. Eating and drinking   Eat heart-healthy foods. Talk with a dietitian to make an eating plan that is right for you. ? Choose foods that contain no trans fat and are low in saturated fat and cholesterol. Healthy choices include fresh or frozen fruits and vegetables, fish, lean meats, legumes, fat-free or low-fat dairy products, and whole-grain or high-fiber foods. ? Limit salt (sodium) if told by your health care provider. Sodium restriction may reduce symptoms of heart failure. Ask a dietitian to recommend heart-healthy seasonings. ? Use healthy cooking methods instead of frying. Healthy methods include roasting, grilling, broiling, baking, poaching, steaming, and stir-frying.  Limit your  fluid intake, if directed by your health care provider. Fluid restriction may reduce symptoms of heart failure. Alcohol use  Do not drink alcohol if: ? Your health care provider tells you not to drink. ? Your heart was damaged by alcohol, or you have severe heart failure. ? You are pregnant, may be pregnant, or are planning to become pregnant.  If you drink alcohol: ? Limit how much you use to:  0-1  drink a day for women.  0-2 drinks a day for men. ? Be aware of how much alcohol is in your drink. In the [place] one drink equals one 12 oz bottle of beer (355 mL), one 5 oz glass of wine (148 mL), or one 1 oz glass of hard liquor (44 mL). Lifestyle   Do not use any products that contain nicotine or tobacco, such as cigarettes, e-cigarettes, and chewing tobacco. If you need help quitting, ask your health care provider. ? Do not use nicotine gum or patches before talking to your health care provider.  Do not use illegal drugs.  Work with your health care provider to safely reach the right body weight.  Do physical activity if told by your health care provider. Talk to your health care provider before you begin an exercise if: ? You are an older adult. ? You have severe heart failure.  Learn to manage stress. If you need help to do this, ask your health care provider.  Participate in or seek rehabilitation as needed to keep or improve your independence and quality of life.  Plan rest periods when you get tired. Monitoring important information   Weigh yourself every day. This will help you to notice if too much fluid is building up in your body. ? Weigh yourself every morning after you urinate and before you eat breakfast. ? Wear the same amount of clothing each time you weigh yourself. ? Record your daily weight. Provide your health care provider with your weight record.  Monitor and record your pulse and blood pressure as told by your health care provider. Dealing with extreme temperatures  If the weather is extremely hot: ? Avoid vigorous physical activity. ? Use air conditioning or fans, or find a cooler location. ? Avoid caffeine and alcohol. ? Wear loose-fitting, lightweight, and light-colored clothing.  If the weather is extremely cold: ? Avoid vigorous activity. ? Layer your clothes. ? Wear mittens or gloves, a hat, and a scarf when you go outside. ? Avoid  alcohol. Follow these instructions at home:  Stay up to date with vaccines. Pneumococcal and flu (influenza) vaccines are especially important in preventing infections of the airways.  Keep all follow-up visits as told by your health care provider. This is important. Contact a health care provider if you:  Have a rapid weight gain.  Have increasing shortness of breath.  Are unable to participate in your usual physical activities.  Get tired easily.  Cough more than normal, especially with physical activity.  Lose your appetite or feel nauseous.  Have any swelling or more swelling in areas such as your hands, feet, ankles, or abdomen.  Are unable to sleep because it is hard to breathe.  Feel like your heart is beating quickly (palpitations).  Become dizzy or light-headed when you stand up. Get help right away if you:  Have trouble breathing.  Notice or your family notices a change in your awareness, such as having trouble staying awake or concentrating.  Have pain or discomfort in your chest.  Have an episode of fainting (syncope). These symptoms may represent a serious problem that is an emergency. Do not wait to see if the symptoms will go away. Get medical help right away. Call your local emergency services (911 in the [place] Do not drive yourself to the hospital. Summary  Heart failure is a serious condition. To care for yourself, you may be asked to change your diet, take certain medicines, and make other lifestyle changes.  Take your medicines every day. Do not stop taking them unless your health care provider tells you to do so.  Eat heart-healthy foods, such as fresh or frozen fruits and vegetables, fish, lean meats, legumes, fat-free or low-fat dairy products, and whole-grain or high-fiber foods.  Ask your health care provider if you have any alcohol restrictions. You may have to stop drinking alcohol if you have severe heart failure.  Contact your health care  provider if you notice problems, such as rapid weight gain or a fast heartbeat. Get help right away if you faint, or have chest pain or trouble breathing. This information is not intended to replace advice given to you by your health care provider. Make sure you discuss any questions you have with your health care provider. Document Revised: 04/24/2018 Document Reviewed: 04/25/2018 Elsevier Patient Education  [place] Sodium Nutrition Therapy  Eating less sodium can help you if you have high blood pressure, heart failure, or kidney or liver disease.   Your body needs a little sodium, but too much sodium can cause your body to hold onto extra water. This extra water will raise your blood pressure and can cause damage to your heart, kidneys, or liver as they are forced to work harder.   Sometimes you can see how the extra fluid affects you because your hands, legs, or belly swell. You may also hold water around your heart and lungs, which makes it hard to breathe.   Even if you take medication for blood pressure or a water pill (diuretic) to remove fluid, it is still important to have less salt in your diet.   Check with your primary care provider before drinking alcohol since it may affect the amount of fluid in your body and how your heart, kidneys, or liver work. Sodium in Food A low-sodium meal plan limits the sodium that you get from food and beverages to 1,500-2,000 milligrams (mg) per day. Salt is the main source of sodium. Read the nutrition label on the package to find out how much sodium is in one serving of a food.  . Select foods with 140 milligrams (mg) of sodium or less per serving.  . You may be able to eat one or two servings of foods with a little more than 140 milligrams (mg) of sodium if you are closely watching how much sodium you eat in a day.  . Check the serving size on the label. The amount of sodium listed on the label shows the amount in one serving of the  food. So, if you eat more than one serving, you will get more sodium than the amount listed.  Tips Cutting Back on Sodium . Eat more fresh foods.  . Fresh fruits and vegetables are low in sodium, as well as frozen vegetables and fruits that have no added juices or sauces.  . Fresh meats are lower in sodium than processed meats, such as bacon, sausage, and hotdogs.  . Not all processed foods are unhealthy, but some processed foods may  have too much sodium.  . Eat less salt at the table and when cooking. One of the ingredients in salt is sodium.  . One teaspoon of table salt has 2,300 milligrams of sodium.  . Leave the salt out of recipes for pasta, casseroles, and soups. . Be a smart shopper.  . Food packages that say "Salt-free", sodium-free", "very low sodium," and "low sodium" have less than 140 milligrams of sodium per serving.  . Beware of products identified as "Unsalted," "No Salt Added," "Reduced Sodium," or "Lower Sodium." These items may still be high in sodium. You should always check the nutrition label. . Add flavors to your food without adding sodium.  . Try lemon juice, lime juice, or vinegar.  . Dry or fresh herbs add flavor.  Sharyn Lull a sodium-free seasoning blend or make your own at home. . You can purchase salt-free or sodium-free condiments like barbeque sauce in stores and online. Ask your registered dietitian nutritionist for recommendations and where to find them.  .  Eating in Restaurants . Choose foods carefully when you eat outside your home. Restaurant foods can be very high in sodium. Many restaurants provide nutrition facts on their menus or their websites. If you cannot find that information, ask your server. Let your server know that you want your food to be cooked without salt and that you would like your salad dressing and sauces to be served on the side.  .   . Foods Recommended . Food Group . Foods Recommended  . Grains . Bread, bagels, rolls without salted  tops Homemade bread made with reduced-sodium baking powder Cold cereals, especially shredded wheat and puffed rice Oats, grits, or cream of wheat Pastas, quinoa, and rice Popcorn, pretzels or crackers without salt Corn tortillas  . Protein Foods . Fresh meats and fish; [place] bacon (check the nutrition labels - make sure they are not packaged in a sodium solution) Canned or packed tuna (no more than 4 ounces at 1 serving) Beans and peas Soybeans) and tofu Eggs Nuts or nut butters without salt  . Dairy . Milk or milk powder Plant milks, such as rice and soy Yogurt, including Greek yogurt Small amounts of natural cheese (blocks of cheese) or reduced-sodium cheese can be used in moderation. (Swiss, ricotta, and fresh mozzarella cheese are lower in sodium than the others) Cream Cheese Low sodium cottage cheese  . Vegetables . Fresh and frozen vegetables without added sauces or salt Homemade soups (without salt) Low-sodium, salt-free or sodium-free canned vegetables and soups  . Fruit . Fresh and canned fruits Dried fruits, such as raisins, cranberries, and prunes  . Oils . Tub or liquid margarine, regular or without salt [place], corn, peanut, olive, safflower, or sunflower oils  . Condiments . Fresh or dried herbs such as basil, bay leaf, dill, mustard (dry), nutmeg, paprika, parsley, rosemary, sage, or thyme.  Low sodium ketchup Vinegar  Lemon or lime juice Pepper, red pepper flakes, and cayenne. Hot sauce contains sodium, but if you use just a drop or two, it will not add up to much.  Salt-free or sodium-free seasoning mixes and marinades Simple salad dressings: vinegar and oil  .  Marland Kitchen Foods Not Recommended . Food Group . Foods Not Recommended  . Grains . Breads or crackers topped with salt Cereals (hot/cold) with more than 300 mg sodium per serving Biscuits, cornbread, and other "quick" breads prepared with baking soda Pre-packaged bread crumbs Seasoned and packaged rice and  pasta mixes Self-rising flours  .  Protein Foods . Cured meats: Bacon, ham, sausage, pepperoni and hot dogs Canned meats (chili, vienna sausage, or sardines) Smoked fish and meats Frozen meals that have more than 600 mg of sodium per serving Egg substitute (with added sodium)  . Dairy . Buttermilk Processed cheese spreads Cottage cheese (1 cup may have over 500 mg of sodium; look for low-sodium.) American or feta cheese Shredded Cheese has more sodium than blocks of cheese String cheese  . Vegetables . Canned vegetables (unless they are salt-free, sodium-free or low sodium) Frozen vegetables with seasoning and sauces Sauerkraut and pickled vegetables Canned or dried soups (unless they are salt-free, sodium-free, or low sodium) [place] fries and onion rings  . Fruit . Dried fruits preserved with additives that have sodium  . Oils . Salted butter or margarine, all types of olives  . Condiments . Salt, sea salt, kosher salt, onion salt, and garlic salt Seasoning mixes with salt Bouillon cubes Ketchup Barbeque sauce and Worcestershire sauce unless low sodium Soy sauce Salsa, pickles, olives, relish Salad dressings: ranch, blue cheese, [place], and [place].  .  . Low Sodium Sample 1-Day Menu  . Breakfast . 1 cup cooked oatmeal  . 1 slice whole wheat bread toast  . 1 tablespoon peanut butter without salt  . 1 banana  . 1 cup 1% milk  . Lunch . Tacos made with: 2 corn tortillas  .  cup black beans, low sodium  .  cup roasted or grilled chicken (without skin)  .  avocado  . Squeeze of lime juice  . 1 cup salad greens  . 1 tablespoon low-sodium salad dressing  .  cup strawberries  . 1 orange  . Afternoon Snack . 1/3 cup grapes  . 6 ounces yogurt  . Evening Meal . 3 ounces herb-baked fish  . 1 baked potato  . 2 teaspoons olive oil  .  cup cooked carrots  . 2 thick slices tomatoes on:  . 2 lettuce leaves  . 1 teaspoon olive oil  . 1 teaspoon balsamic vinegar  . 1 cup 1%  milk  . Evening Snack . 1 apple  .  cup almonds without salt  .  Marland Kitchen Low-Sodium Vegetarian (Lacto-Ovo) Sample 1-Day Menu  . Breakfast . 1 cup cooked oatmeal  . 1 slice whole wheat toast  . 1 tablespoon peanut butter without salt  . 1 banana  . 1 cup 1% milk  . Lunch . Tacos made with: 2 corn tortillas  .  cup black beans, low sodium  .  cup roasted or grilled chicken (without skin)  .  avocado  . Squeeze of lime juice  . 1 cup salad greens  . 1 tablespoon low-sodium salad dressing  .  cup strawberries  . 1 orange  . Evening Meal . Stir fry made with:  cup tofu  . 1 cup brown rice  .  cup broccoli  .  cup green beans  .  cup peppers  .  tablespoon peanut oil  . 1 orange  . 1 cup 1% milk  . Evening Snack . 4 strips celery  . 2 tablespoons hummus  . 1 hard-boiled egg  .  Marland Kitchen Low-Sodium Vegan Sample 1-Day Menu  . Breakfast . 1 cup cooked oatmeal  . 1 tablespoon peanut butter without salt  . 1 cup blueberries  . 1 cup soymilk fortified with calcium, vitamin B12, and vitamin D  . Lunch . 1 small whole wheat pita  .  cup cooked  lentils  . 2 tablespoons hummus  . 4 carrot sticks  . 1 medium apple  . 1 cup soymilk fortified with calcium, vitamin B12, and vitamin D  . Evening Meal . Stir fry made with:  cup tofu  . 1 cup brown rice  .  cup broccoli  .  cup green beans  .  cup peppers  .  tablespoon peanut oil  . 1 cup cantaloupe  . Evening Snack . 1 cup soy yogurt  .  cup mixed nuts  . Copyright 2020  Academy of Nutrition and Dietetics. All rights reserved .  Marland Kitchen Sodium Free Flavoring Tips .  Marland Kitchen When cooking, the following items may be used for flavoring instead of salt or seasonings that contain sodium. . Remember: A little bit of spice goes a long way! Be careful not to overseason. Marland Kitchen Spice Blend Recipe (makes about ? cup) . 5 teaspoons onion powder  . 2 teaspoons garlic powder  . 2 teaspoons paprika  . 2 teaspoon dry mustard  . 1 teaspoon crushed  thyme leaves  .  teaspoon white pepper  .  teaspoon celery seed Food Item Flavorings  Beef Basil, bay leaf, caraway, curry, dill, dry mustard, garlic, grape jelly, green pepper, mace, marjoram, mushrooms (fresh), nutmeg, onion or onion powder, parsley, pepper, rosemary, sage  Chicken Basil, cloves, cranberries, mace, mushrooms (fresh), nutmeg, oregano, paprika, parsley, pineapple, saffron, sage, savory, tarragon, thyme, tomato, turmeric  Egg Chervil, curry, dill, dry mustard, garlic or garlic powder, green pepper, jelly, mushrooms (fresh), nutmeg, onion powder, paprika, parsley, rosemary, tarragon, tomato  Fish Basil, bay leaf, chervil, curry, dill, dry mustard, green pepper, lemon juice, marjoram, mushrooms (fresh), paprika, pepper, tarragon, tomato, turmeric  Lamb Cloves, curry, dill, garlic or garlic powder, mace, mint, mint jelly, onion, oregano, parsley, pineapple, rosemary, tarragon, thyme  Pork Applesauce, basil, caraway, chives, cloves, garlic or garlic powder, onion or onion powder, rosemary, thyme  Veal Apricots, basil, bay leaf, currant jelly, curry, ginger, marjoram, mushrooms (fresh), oregano, paprika  Vegetables Basil, dill, garlic or garlic powder, ginger, lemon juice, mace, marjoram, nutmeg, onion or onion powder, tarragon, tomato, sugar or sugar substitute, salt-free salad dressing, vinegar  Desserts Allspice, anise, cinnamon, cloves, ginger, mace, nutmeg, vanilla extract, other extracts   Copyright 2020  Academy of Nutrition and Dietetics. All rights reserved  Fluid Restricted Nutrition Therapy  You have been prescribed this diet because your condition affects how much fluid you can eat or drink. If your heart, liver, or kidneys aren't working properly, you may not be able to effectively eliminate fluids from the body and this may cause swelling (edema) in the legs, arms, and/or stomach. Drink no more than _________ liters or ________ ounces or ________cups of fluid per day.   . You don't need to stop eating or drinking the same fluids you normally would, but you may need to eat or drink less than usual.  . Your registered dietitian nutritionist will help you determine the correct amount of fluid to consume during the day Breakfast Include fluids taken with medications  Lunch Include fluids taken with medications  Dinner Include fluids taken with medications  Bedtime Snack Include fluids taken with medications     Tips What Are Fluids?  A fluid is anything that is liquid or anything that would melt if left at room temperature. You will need to count these foods and liquids--including any liquid used to take medication--as part of your daily fluid intake. Some examples are: . Alcohol (drink  only with your doctor's permission)  . Coffee, tea, and other hot beverages  . Gelatin (Jell-O)  . Gravy  . Ice cream, sherbet, sorbet  . Ice cubes, ice chips  . Milk, liquid creamer  . Nutritional supplements  . Popsicles  . Vegetable and fruit juices; fluid in canned fruit  . Watermelon  . Yogurt  . Soft drinks, lemonade, limeade  . Soups  . Syrup How Do I Measure My Fluid Intake? Marland Kitchen Record your fluid intake daily.  . Tip: Every day, each time you eat or drink fluids, pour water in the same amount into an empty container that can hold the same amount of fluids you are allowed daily. This may help you keep track of how much fluid you are taking in throughout the day.  . To accurately keep track of how much liquid you take in, measure the size of the cups, glasses, and bowls you use. If you eat soup, measure how much of it is liquid and how much is solid (such as noodles, vegetables, meat). Conversions for Measuring Fluid Intake  Milliliters (mL) Liters (L) Ounces (oz) Cups (c)  1000 1 32 4  1200 1.2 40 5  1500 1.5 50 6 1/4  1800 1.8 60 7 1/2  2000 2 67 8 1/3  Tips to Reduce Your Thirst . Chew gum or suck on hard candy.  . Rinse or gargle with mouthwash. Do not  swallow.  . Ice chips or popsicles my help quench thirst, but this too needs to be calculated into the total restriction. Melt ice chips or cubes first to figure out how much fluid they produce (for example, experiment with melting  cup ice chips or 2 ice cubes).  . Add a lemon wedge to your water.  . Limit how much salt you take in. A high salt intake might make you thirstier.  . Don't eat or drink all your allowed liquids at once. Space your liquids out through the day.  . Use small glasses and cups and sip slowly. If allowed, take your medications with fluids you eat or drink during a meal.   Fluid-Restricted Nutrition Therapy Sample 1-Day Menu  Breakfast 1 slice wheat toast  1 tablespoon peanut butter  1/2 cup yogurt (120 milliliters)  1/2 cup blueberries  1 cup milk (240 milliliters)   Lunch 3 ounces sliced [place]  2 slices whole wheat bread  1/2 cup lettuce for sandwich  2 slices tomato for sandwich  1 ounce reduced-fat, reduced-sodium cheese  1/2 cup fresh carrot sticks  1 banana  1 cup unsweetened tea (240 milliliters)   Evening Meal 8 ounces soup (240 milliliters)  3 ounces salmon  1/2 cup quinoa  1 cup green beans  1 cup mixed greens salad  1 tablespoon olive oil  1 cup coffee (240 milliliters)  Evening Snack 1/2 cup sliced peaches  1/2 cup frozen yogurt (120 milliliters)  1 cup water (240 milliliters)  Copyright 2020  Academy of Nutrition and Dietetics. All rights reserved

## 2019-04-26 NOTE — Progress Notes (Addendum)
Nutrition Education Note  RD consulted for nutrition education regarding new onset CHF.  Attempted to speak with pt via phone call into room, however, no answer. RD also attempted to speak with pt in person earlier today, however, was receiving nursing care at time of visit.   RD provided "Low Sodium Nutrition Therapy" handout from the Academy of Nutrition and Dietetics. Attached handout to AVS/ discharge summary.  Body mass index is 35.54 kg/m. Pt meets criteria for obesity, class II based on current BMI.  Current diet order is heart healthy/ carb modified, patient is consuming approximately 100% of meals at this time. Labs and medications reviewed. No further nutrition interventions warranted at this time. RD contact information provided. If additional nutrition issues arise, please re-consult RD.   Loistine Chance, RD, LDN, [place] Registered Dietitian II Certified Diabetes Care and Education Specialist Please refer to [facility] for RD and/or RD on-call/weekend/after hours pager

## 2019-04-26 NOTE — Progress Notes (Signed)
Progress Note  Patient Name: Blake West Date of Encounter: 04/26/2019  Primary Cardiologist: Harrington Challenger  Subjective   Breathing is better than yesterday   No CP    Inpatient Medications    Scheduled Meds: . aspirin EC  81 mg Oral Daily  . docusate sodium  100 mg Oral BID  . furosemide  80 mg Intravenous Q12H  . losartan  50 mg Oral Daily  . potassium chloride  20 mEq Oral Daily  . sodium chloride flush  3 mL Intravenous Q12H   Continuous Infusions: . sodium chloride     PRN Meds: sodium chloride, acetaminophen **OR** acetaminophen, bisacodyl, hydrALAZINE, HYDROcodone-acetaminophen, morphine injection, ondansetron **OR** ondansetron (ZOFRAN) IV, polyethylene glycol, sodium chloride flush, zolpidem   Vital Signs    Vitals:   04/25/19 2211 04/26/19 0458 04/26/19 0819 04/26/19 1028  BP: 122/81 100/79 100/67 109/72  Pulse: 90 83 82   Resp: (!) 21 18 17    Temp: 97.6 F (36.4 C) (!) 97.5 F (36.4 C) 97.8 F (36.6 C)   TempSrc: Oral Oral Oral   SpO2: 97% 92% 92%   Weight: 115.6 kg     Height: 5\' 11"  (1.803 m)       Intake/Output Summary (Last 24 hours) at 04/26/2019 1301 Last data filed at 04/26/2019 1237 Gross per 24 hour  Intake 240 ml  Output 7675 ml  Net -7435 ml   Last 3 Weights 04/25/2019 04/25/2019 09/17/2015  Weight (lbs) 254 lb 12.8 oz 260 lb 260 lb 8 oz  Weight (kg) 115.577 kg 117.935 kg 118.162 kg      Telemetry    - Personally Reviewed  ECG    Not done   - Personally Reviewed  Physical Exam   GEN: No acute distress.   Neck: JVP increased Cardiac: RRR,  +S3   Respiratory:   Rales at bases  GI: Soft, nontender,  Sl distended   MS: 1-2+ edema; No deformity. Neuro:  Nonfocal  Psych: Normal affect   Labs    High Sensitivity Troponin:   Recent Labs  Lab 04/25/19 1404 04/25/19 1551  TROPONINIHS 23* 21*      Chemistry Recent Labs  Lab 04/25/19 1404 04/25/19 2359  NA 141 140  K 4.7 4.1  CL 104 103  CO2 25 26  GLUCOSE 138* 107*  BUN 23  26*  CREATININE 1.00 1.17  CALCIUM 9.0 8.3*  GFRNONAA >60 >60  GFRAA >60 >60  ANIONGAP 12 11     Hematology Recent Labs  Lab 04/25/19 1404 04/25/19 2359  WBC 13.3* 12.2*  RBC 5.63 5.29  HGB 16.3 15.3  HCT 50.9 47.2  MCV 90.4 89.2  MCH 29.0 28.9  MCHC 32.0 32.4  RDW 15.3 15.2  PLT 300 284    BNP Recent Labs  Lab 04/25/19 1416  BNP 2,068.6*     DDimer No results for input(s): DDIMER in the last 168 hours.   Radiology    DG Chest 2 View  Result Date: 04/25/2019 CLINICAL DATA:  Shortness of breath EXAM: CHEST - 2 VIEW COMPARISON:  04/17/2019 FINDINGS: Mild cardiomegaly. Similar appearance of diffuse bilateral interstitial pulmonary opacity, likely with trace bilateral pleural effusions. No new or focal airspace opacity. IMPRESSION: Similar appearance of diffuse bilateral interstitial pulmonary opacity, likely with trace bilateral pleural effusions. No new or focal airspace opacity. Findings are consistent with atypical/viral infection or edema. Electronically Signed   By: Eddie Candle M.D.   On: 04/25/2019 14:53    Cardiac Studies  Echo:   04/26/19  Patient Profile     69 y.o. male hx of palpitations who presents for increased SOB and swelling   Assessment & Plan    1  CHF Echo just done   LVEf is severely depressed at 20 to 25%   RV appears OK   Pt with acute systolic CHF   Diuresing nicely but still with increased volume on exam Plan to continue diuresis    BP is marginal   Would hold medical Rx for now as diurese Will decrease losartan to 25 mg  Hold on  b blocker when volume improves    Watch Cr   K     Would recomm ischemic work up once volume improved  Discussed heart catheterization    Risks/benefits  Pt understands and agrees to proceed on Monday with R and L heart catheterizion  And possible intervention if lesion found     Patient placed on schedule    2  Lpiids  LDL 113  HDL 25  Will add lipitor 40 mg  Empirically   For questions or updates, please  contact [facility] Please consult [email or web address] for contact info under        Signed, Dorris Carnes, MD  04/26/2019, 1:01 PM

## 2019-04-26 NOTE — Progress Notes (Signed)
Pt noted to have come off the telemetry monitor at this time; was pulling telemetry box, O2, and gown off as RN entered the room. Got OOB very quickly to "stretch my legs", then walked into BR to urinate. Pt very disoriented at this time, repeatedly asking the same questions to staff ("did I hurt you?"  "Are you sure I'm Elizbeth Squires?"  "What is the year and month?" "Are you sure I'm from this planet?")  Pt sat on toilet for approximately 25 minutes, attempting to recall circumstances that brought him to the hospital before ambulating back to the bed, where staff suggested it would help to elevate his legs on the mattress to help decrease the swelling. Pt was compliant, and staff placed telemetry monitor and O2 back on, & remained with pt until pt began to yawn and state he was half asleep. Pt now resting quietly, no labored breathing at this time, SpO2=91-92% on 2L. Bed alarm placed on pt's bed, will continue to monitor.

## 2019-04-26 NOTE — Progress Notes (Addendum)
PROGRESS NOTE    Blake West  IPJ:[id number] DOB: Mar 21, 1950 DOA: 04/25/2019 PCP: Patient, No Pcp Per   Brief Narrative:  HPI: Blake West is a 69 y.o. male with medical history significant of hypothyroidism; OSA on CPAP; and HTN presenting with SOB.  He reports LE edema.  He thought he had pneumonia.  He was SOB, coughing mostly nonproductive.  No chest pain.  He has had wheezing.  No fevers.  +orthopnea - "I haven't been able to do that since I was a kid."  +PND.  He uses a CPAP at night.  SOB is worse with exertion.  He is hoping that we are able to improve his symptoms overnight and discharge him to home tomorrow.  He is uncertain if he will be willing to take medications at home.  His wife is Native Optometrist and a Theatre stage manager with a degree in herbatology and they prefer a holistic medicine approach.   ED Course: New-onset CHF - significant edema, markedly elevated BNP.  +cough, SOB.  87% with ambulation.  Assessment & Plan:   Principal Problem:   New onset of congestive heart failure (HCC) Active Problems:   Essential hypertension   OSA on CPAP   Class 2 severe obesity due to excess calories with serious comorbidity and body mass index (BMI) of 36.0 to 36.9 in adult [facility])   Acute systolic (congestive) heart failure (HCC)   Type 2 diabetes mellitus ([place])   Hyperlipidemia   New onset acute systolic congestive heart failure: Echo shows 25% ejection fraction.  Still with some exertional dyspnea but much better than yesterday.  Lungs clear to auscultation.  +2 pitting edema bilateral lower extremity.  Cardiology on board.  Patient remains on aspirin, losartan and Lasix.  Maintain strict I's and O's, daily weight.  Appreciate cardiology help.  Plan for left and right heart cath on Monday.  Hyperlipidemia: Continue atorvastatin.  Essential hypertension: Blood pressure much better controlled.  Continue above-mentioned medications.  New onset type 2 diabetes mellitus: Hemoglobin  A1c 6.7.  Will start on SSI.  Will need Metformin at discharge.  Obesity: Weight loss encouraged.  OSA: Continue CPAP.  DVT prophylaxis: Lovenox   Code Status: DNR  Family Communication: Wife present at bedside. Plan of care discussed with patient in length and he verbalized understanding and agreed with it. Patient is from: Home Disposition Plan: Home Barriers to discharge: Pending heart catheterization on Monday   Estimated body mass index is 35.54 kg/m as calculated from the following:   Height as of this encounter: 5\' 11"  (1.803 m).   Weight as of this encounter: 115.6 kg.      Nutritional status:               Consultants:   Cardiology  Procedures:   None  Antimicrobials:   None   Subjective: Seen and examined.  Wife at the bedside.  Feels some shortness of breath when oxygen is off.  No chest pain or other complaint.  Objective: Vitals:   04/25/19 2211 04/26/19 0458 04/26/19 0819 04/26/19 1028  BP: 122/81 100/79 100/67 109/72  Pulse: 90 83 82   Resp: (!) 21 18 17    Temp: 97.6 F (36.4 C) (!) 97.5 F (36.4 C) 97.8 F (36.6 C)   TempSrc: Oral Oral Oral   SpO2: 97% 92% 92%   Weight: 115.6 kg     Height: 5\' 11"  (1.803 m)       Intake/Output Summary (Last 24 hours) at 04/26/2019 1323  Last data filed at 04/26/2019 1237 Gross per 24 hour  Intake 240 ml  Output 7675 ml  Net -7435 ml   Filed Weights   04/25/19 1349 04/25/19 2211  Weight: 117.9 kg 115.6 kg    Examination:  General exam: Appears calm and comfortable  Respiratory system: Clear to auscultation with diminished breath sounds. Respiratory effort normal. Cardiovascular system: S1 & S2 heard, RRR. No JVD, murmurs, rubs, gallops or clicks.  +2 pitting edema bilateral lower extremity Gastrointestinal system: Abdomen is nondistended, soft and nontender. No organomegaly or masses felt. Normal bowel sounds heard. Central nervous system: Alert and oriented. No focal neurological  deficits. Extremities: Symmetric 5 x 5 power. Skin: No rashes, lesions or ulcers Psychiatry: Judgement and insight appear normal. Mood & affect appropriate.    Data Reviewed: I have personally reviewed following labs and imaging studies  CBC: Recent Labs  Lab 04/25/19 1404 04/25/19 2359  WBC 13.3* 12.2*  NEUTROABS  --  7.2  HGB 16.3 15.3  HCT 50.9 47.2  MCV 90.4 89.2  PLT 300 585   Basic Metabolic Panel: Recent Labs  Lab 04/25/19 1404 04/25/19 2359  NA 141 140  K 4.7 4.1  CL 104 103  CO2 25 26  GLUCOSE 138* 107*  BUN 23 26*  CREATININE 1.00 1.17  CALCIUM 9.0 8.3*   GFR: Estimated Creatinine Clearance: 77 mL/min (by C-G formula based on SCr of 1.17 mg/dL). Liver Function Tests: No results for input(s): AST, ALT, ALKPHOS, BILITOT, PROT, ALBUMIN in the last 168 hours. No results for input(s): LIPASE, AMYLASE in the last 168 hours. No results for input(s): AMMONIA in the last 168 hours. Coagulation Profile: No results for input(s): INR, PROTIME in the last 168 hours. Cardiac Enzymes: No results for input(s): CKTOTAL, CKMB, CKMBINDEX, TROPONINI in the last 168 hours. BNP (last 3 results) No results for input(s): PROBNP in the last 8760 hours. HbA1C: Recent Labs    04/25/19 1919  HGBA1C 6.7*   CBG: No results for input(s): GLUCAP in the last 168 hours. Lipid Profile: Recent Labs    04/25/19 1919  CHOL 156  HDL 25*  LDLCALC 113*  TRIG 88  CHOLHDL 6.2   Thyroid Function Tests: Recent Labs    04/25/19 1919  TSH 11.128*   Anemia Panel: Recent Labs    04/25/19 1919  FERRITIN 163   Sepsis Labs: No results for input(s): PROCALCITON, LATICACIDVEN in the last 168 hours.  Recent Results (from the past 240 hour(s))  SARS CORONAVIRUS 2 (TAT 6-24 HRS) Nasopharyngeal Nasopharyngeal Swab     Status: None   Collection Time: 04/25/19  3:51 PM   Specimen: Nasopharyngeal Swab  Result Value Ref Range Status   SARS Coronavirus 2 NEGATIVE NEGATIVE Final     Comment: (NOTE) SARS-CoV-2 target nucleic acids are NOT DETECTED. The SARS-CoV-2 RNA is generally detectable in upper and lower respiratory specimens during the acute phase of infection. Negative results do not preclude SARS-CoV-2 infection, do not rule out co-infections with other pathogens, and should not be used as the sole basis for treatment or other patient management decisions. Negative results must be combined with clinical observations, patient history, and epidemiological information. The expected result is Negative. Fact Sheet for Patients: SugarRoll.be Fact Sheet for Healthcare Providers: https://www.woods-mathews.com/ This test is not yet approved or cleared by the [place] FDA and  has been authorized for detection and/or diagnosis of SARS-CoV-2 by FDA under an Emergency Use Authorization (EUA). This EUA will remain  in effect (meaning  this test can be used) for the duration of the COVID-19 declaration under Section 56 4(b)(1) of the Act, 21 U.S.C. section 360bbb-3(b)(1), unless the authorization is terminated or revoked sooner. Performed at [facility], [place] [address] [place], [place] [address]       Radiology Studies: DG Chest 2 View  Result Date: 04/25/2019 CLINICAL DATA:  Shortness of breath EXAM: CHEST - 2 VIEW COMPARISON:  04/17/2019 FINDINGS: Mild cardiomegaly. Similar appearance of diffuse bilateral interstitial pulmonary opacity, likely with trace bilateral pleural effusions. No new or focal airspace opacity. IMPRESSION: Similar appearance of diffuse bilateral interstitial pulmonary opacity, likely with trace bilateral pleural effusions. No new or focal airspace opacity. Findings are consistent with atypical/viral infection or edema. Electronically Signed   By: Eddie Candle M.D.   On: 04/25/2019 14:53   ECHOCARDIOGRAM COMPLETE  Result Date: 04/26/2019    ECHOCARDIOGRAM REPORT   Patient Name:   MONIQUE GIFT [facility]  Date of Exam: 04/26/2019 Medical Rec #:  [id number]     Height:       71.0 in Accession #:    [id number]    Weight:       254.8 lb Date of Birth:  09-12-1950     BSA:          2.337 m Patient Age:    33 years      BP:           100/67 mmHg Patient Gender: M             HR:           73 bpm. Exam Location:  Inpatient Procedure: 2D Echo, Cardiac Doppler and Color Doppler Indications:    I50.40* Unspecified combined systolic (congestive) and diastolic                 (congestive) heart failure  History:        Patient has no prior history of Echocardiogram examinations.                 CHF, Abnormal ECG, Signs/Symptoms:Shortness of Breath and                 Dyspnea; Risk Factors:Sleep Apnea and Hypertension.  Sonographer:    Roseanna Rainbow RDCS Referring Phys: [id number] JENNIFER YATES  Sonographer Comments: Patient is morbidly obese and Technically difficult study due to poor echo windows. Image acquisition challenging due to patient body habitus. IMPRESSIONS  1. Severe global reduction in LV systolic function; mild LVH; mildly dilated aortic root; mild MR; mild LAE; moderate RV dysfunction.  2. Left ventricular ejection fraction, by estimation, is 20 to 25%. The left ventricle has severely decreased function. The left ventricle demonstrates global hypokinesis. There is mild left ventricular hypertrophy. Left ventricular diastolic function could not be evaluated.  3. Right ventricular systolic function is moderately reduced. The right ventricular size is normal.  4. Left atrial size was mildly dilated.  5. The mitral valve is normal in structure. Mild mitral valve regurgitation. No evidence of mitral stenosis.  6. The aortic valve is tricuspid. Aortic valve regurgitation is not visualized. Mild aortic valve sclerosis is present, with no evidence of aortic valve stenosis.  7. Aortic dilatation noted. There is mild dilatation of the aortic root measuring 40 mm.  8. The inferior vena cava is dilated in size with <50% respiratory  variability, suggesting right atrial pressure of 15 mmHg. FINDINGS  Left Ventricle: Left ventricular ejection fraction, by estimation, is 20 to 25%. The  left ventricle has severely decreased function. The left ventricle demonstrates global hypokinesis. The left ventricular internal cavity size was normal in size. There is mild left ventricular hypertrophy. Left ventricular diastolic function could not be evaluated due to atrial fibrillation. Left ventricular diastolic function could not be evaluated. Right Ventricle: The right ventricular size is normal. Right ventricular systolic function is moderately reduced. Left Atrium: Left atrial size was mildly dilated. Right Atrium: Right atrial size was normal in size. Pericardium: There is no evidence of pericardial effusion. Mitral Valve: The mitral valve is normal in structure. Normal mobility of the mitral valve leaflets. Mild mitral annular calcification. Mild mitral valve regurgitation. No evidence of mitral valve stenosis. Tricuspid Valve: The tricuspid valve is normal in structure. Tricuspid valve regurgitation is trivial. No evidence of tricuspid stenosis. Aortic Valve: The aortic valve is tricuspid. Aortic valve regurgitation is not visualized. Mild aortic valve sclerosis is present, with no evidence of aortic valve stenosis. Pulmonic Valve: The pulmonic valve was normal in structure. Pulmonic valve regurgitation is trivial. No evidence of pulmonic stenosis. Aorta: Aortic dilatation noted. There is mild dilatation of the aortic root measuring 40 mm. Venous: The inferior vena cava is dilated in size with less than 50% respiratory variability, suggesting right atrial pressure of 15 mmHg.  Additional Comments: Severe global reduction in LV systolic function; mild LVH; mildly dilated aortic root; mild MR; mild LAE; moderate RV dysfunction.  LEFT VENTRICLE PLAX 2D LVIDd:         4.90 cm LVIDs:         4.60 cm LV PW:         1.54 cm LV IVS:        1.32 cm LVOT diam:      2.30 cm LV SV:         81 LV SV Index:   35 LVOT Area:     4.15 cm  LV Volumes (MOD) LV vol d, MOD A2C: 130.0 ml LV vol d, MOD A4C: 88.4 ml LV vol s, MOD A2C: 86.6 ml LV vol s, MOD A4C: 72.3 ml LV SV MOD A2C:     43.5 ml LV SV MOD A4C:     88.4 ml LV SV MOD BP:      29.6 ml RIGHT VENTRICLE         IVC TAPSE (M-mode): 1.2 cm  IVC diam: 2.49 cm LEFT ATRIUM             Index       RIGHT ATRIUM           Index LA diam:        5.10 cm 2.18 cm/m  RA Area:     23.10 cm LA Vol (A2C):   84.4 ml 36.11 ml/m RA Volume:   68.70 ml  29.39 ml/m LA Vol (A4C):   78.3 ml 33.50 ml/m LA Biplane Vol: 82.6 ml 35.34 ml/m  AORTIC VALVE             PULMONIC VALVE LVOT Vmax:   102.00 cm/s PR End Diast Vel: 1.65 msec LVOT Vmean:  76.300 cm/s LVOT VTI:    0.196 m  AORTA Ao Root diam: 4.00 cm Ao Asc diam:  2.90 cm MITRAL VALVE MV Area (PHT): 5.27 cm    SHUNTS MV Decel Time: 144 msec    Systemic VTI:  0.20 m MV E velocity: 93.85 cm/s  Systemic Diam: 2.30 cm Kirk Ruths MD Electronically signed by Kirk Ruths MD Signature Date/Time: 04/26/2019/1:19:44 PM  Final     Scheduled Meds: . aspirin EC  81 mg Oral Daily  . atorvastatin  40 mg Oral q1800  . docusate sodium  100 mg Oral BID  . furosemide  80 mg Intravenous Q12H  . insulin aspart  0-5 Units Subcutaneous QHS  . insulin aspart  0-9 Units Subcutaneous TID WC  . [START ON 04/27/2019] losartan  25 mg Oral Daily  . potassium chloride  20 mEq Oral Daily  . sodium chloride flush  3 mL Intravenous Q12H   Continuous Infusions: . sodium chloride       LOS: 1 day   Time spent: 34 minutes   Darliss Cheney, MD [facility]  04/26/2019, 1:23 PM   To contact the attending provider between 7A-7P or the covering provider during after hours 7P-7A, please log into the web site [email or web address].

## 2019-04-26 NOTE — Plan of Care (Signed)
  Problem: Clinical Measurements: Goal: Will remain free from infection Outcome: Progressing   Problem: Nutrition: Goal: Adequate nutrition will be maintained Outcome: Progressing   Problem: Elimination: Goal: Will not experience complications related to urinary retention Outcome: Progressing

## 2019-04-26 NOTE — Progress Notes (Signed)
Pt refuses to use CPAP for the night.

## 2019-04-26 NOTE — Progress Notes (Signed)
Patient is A/O x4, lying in bed with no complaints. Vital signs stable. Spouse bedside. Patient SpO2 92% on 2L oxygen [place]. No complaints of pain at this time. Patient had lasix IV, continent w/urinal use. Plan of care discussed with patient and spouse.  Patient refusing all medication ordered except potassium. Message sent to physician to inform.

## 2019-04-26 NOTE — Evaluation (Signed)
Physical Therapy Evaluation & Discharge Patient Details Name: Blake West MRN: [id number] DOB: Jan 14, 1951 Today's Date: 04/26/2019   History of Present Illness  Pt is a 69 y.o. male admitted 04/25/19 with SOB, cough and LE edema. CXR consistent with pulmonary edema. Worked up for new-onset CHF with hypoxia. PMH includes HTN, OSA on CPAP, gout, obesity.    Clinical Impression  Patient evaluated by Physical Therapy with no further acute PT needs identified. PTA, pt independent at home, lives with wife, both retired from work and wife available for 24/7 supervision. Today, pt moving well, ambulating at supervision-level; demonstrates poor safety awareness and decreased attention. Wife reports pt near baseline cognition. SpO2 89-96% on RA with ambulation. Educ re: activity recommendations, edema control, O2 needs/pulse ox use. All education has been completed and the patient has no further questions. PT is signing off. Thank you for this referral.    Follow Up Recommendations No PT follow up;Supervision/Assistance - 24 hour    Equipment Recommendations  None recommended by PT    Recommendations for Other Services       Precautions / Restrictions Precautions Precautions: Fall Restrictions Weight Bearing Restrictions: No      Mobility  Bed Mobility Overal bed mobility: Modified Independent             General bed mobility comments: Seated at EOB upon arrival  Transfers Overall transfer level: Independent Equipment used: None                Ambulation/Gait Ambulation/Gait assistance: Supervision Gait Distance (Feet): 120 Feet Assistive device: None Gait Pattern/deviations: Step-through pattern;Decreased stride length;Wide base of support   Gait velocity interpretation: 1.31 - 2.62 ft/sec, indicative of limited community ambulator General Gait Details: Slow, mostly steady gait without UE support; distance limited by c/o chronic knee pain. "My knees won't let me go much  further than this"  Stairs            Wheelchair Mobility    Modified Rankin (Stroke Patients Only)       Balance Overall balance assessment: Mild deficits observed, not formally tested                                           Pertinent Vitals/Pain Pain Assessment: Faces Faces Pain Scale: Hurts a little bit Pain Location: Bilateral knees (chronic), bilateral thumb Pain Descriptors / Indicators: Cramping;Discomfort Pain Intervention(s): Monitored during session;Limited activity within patient's tolerance    Home Living Family/patient expects to be discharged to:: Private residence Living Arrangements: Spouse/significant other Available Help at Discharge: Family;Available 24 hours/day Type of Home: House Home Access: Stairs to enter Entrance Stairs-Rails: Right Entrance Stairs-Number of Steps: 3 Home Layout: Two level;Able to live on main level with bedroom/bathroom(Shower upstairs) Home Equipment: None      Prior Function Level of Independence: Independent         Comments: 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, newspaper        Extremity/Trunk Assessment   Upper Extremity Assessment Upper Extremity Assessment: Overall WFL for tasks assessed    Lower Extremity Assessment Lower Extremity Assessment: Generalized weakness       Communication   Communication: No difficulties  Cognition Arousal/Alertness: Awake/alert Behavior During Therapy: WFL for tasks assessed/performed Overall Cognitive Status: History of cognitive impairments - at baseline Area of Impairment: Attention;Safety/judgement;Awareness  Current Attention Level: Sustained;Selective     Safety/Judgement: Decreased awareness of safety;Decreased awareness of deficits Awareness: Emergent   General Comments: Pt's wife present and reports pt near baseline cognition      General Comments General comments (skin integrity, edema, etc.):  Wife present and supportive. SpO2 89-96% on RA with mobility    Exercises Other Exercises Other Exercises: Seated hamstring stretch Other Exercises: Talked through options for sitting/sleeping with BLEs elevated since pt sleeps in upright chair but has difficulty tolerating LEs elevated due to abdominal pressure   Assessment/Plan    PT Assessment Patent does not need any further PT services  PT Problem List         PT Treatment Interventions      PT Goals (Current goals can be found in the Care Plan section)  Acute Rehab PT Goals Patient Stated Goal: Return home with O2 if needed PT Goal Formulation: With patient/family Time For Goal Achievement: 05/10/19 Potential to Achieve Goals: Good    Frequency     Barriers to discharge        Co-evaluation               AM-PAC PT "6 Clicks" Mobility  Outcome Measure Help needed turning from your back to your side while in a flat bed without using bedrails?: None Help needed moving from lying on your back to sitting on the side of a flat bed without using bedrails?: None Help needed moving to and from a bed to a chair (including a wheelchair)?: None Help needed standing up from a chair using your arms (e.g., wheelchair or bedside chair)?: None Help needed to walk in hospital room?: None Help needed climbing 3-5 steps with a railing? : A Little 6 Click Score: 23    End of Session   Activity Tolerance: Patient tolerated treatment well Patient left: with call bell/phone within reach;with family/visitor present(seated EOB) Nurse Communication: Mobility status PT Visit Diagnosis: Other abnormalities of gait and mobility (R26.89)    Time: 6203-5597 PT Time Calculation (min) (ACUTE ONLY): 22 min   Charges:   PT Evaluation $PT Eval Moderate Complexity: [place], PT, DPT Acute Rehabilitation Services  Pager [phone] Office [place] 04/26/2019, 9:28 AM

## 2019-04-27 LAB — BASIC METABOLIC PANEL
Anion gap: 11 (ref 5–15)
BUN: 29 mg/dL — ABNORMAL HIGH (ref 8–23)
CO2: 32 mmol/L (ref 22–32)
Calcium: 9.2 mg/dL (ref 8.9–10.3)
Chloride: 100 mmol/L (ref 98–111)
Creatinine, Ser: 1.36 mg/dL — ABNORMAL HIGH (ref 0.61–1.24)
GFR calc Af Amer: >60 mL/min (ref >60)
GFR calc non Af Amer: 53 mL/min — ABNORMAL LOW (ref >60)
Glucose, Bld: 115 mg/dL — ABNORMAL HIGH (ref 70–99)
Potassium: 4.6 mmol/L (ref 3.5–5.1)
Sodium: 143 mmol/L (ref 135–145)

## 2019-04-27 LAB — CBC
HCT: 48.6 % (ref 39.0–52.0)
Hemoglobin: 15.4 g/dL (ref 13.0–17.0)
MCH: 28.5 pg (ref 26.0–34.0)
MCHC: 31.7 g/dL (ref 30.0–36.0)
MCV: 90 fL (ref 80.0–100.0)
Platelets: 287 10*3/uL (ref 150–400)
RBC: 5.4 MIL/uL (ref 4.22–5.81)
RDW: 15.2 % (ref 11.5–15.5)
WBC: 12.2 10*3/uL — ABNORMAL HIGH (ref 4.0–10.5)
nRBC: 0 % (ref 0.0–0.2)

## 2019-04-27 NOTE — Progress Notes (Signed)
PROGRESS NOTE    Blake West  YCX:[id number] DOB: 1950-03-29 DOA: 04/25/2019 PCP: Patient, No Pcp Per   Brief Narrative:  HPI: Blake West is a 69 y.o. male with medical history significant of hypothyroidism; OSA on CPAP; and HTN presenting with SOB.  He reports LE edema.  He thought he had pneumonia.  He was SOB, coughing mostly nonproductive.  No chest pain.  He has had wheezing.  No fevers.  +orthopnea - "I haven't been able to do that since I was a kid."  +PND.  He uses a CPAP at night.  SOB is worse with exertion.  He is hoping that we are able to improve his symptoms overnight and discharge him to home tomorrow.  He is uncertain if he will be willing to take medications at home.  His wife is Native Optometrist and a Theatre stage manager with a degree in herbatology and they prefer a holistic medicine approach.   ED Course: New-onset CHF - significant edema, markedly elevated BNP.  +cough, SOB.  87% with ambulation.  Assessment & Plan:   Principal Problem:   New onset of congestive heart failure (HCC) Active Problems:   Essential hypertension   OSA on CPAP   Class 2 severe obesity due to excess calories with serious comorbidity and body mass index (BMI) of 36.0 to 36.9 in adult [facility])   Acute systolic (congestive) heart failure (HCC)   Type 2 diabetes mellitus ([place])   Hyperlipidemia  New onset acute systolic congestive heart failure: Echo shows 25% ejection fraction.  Feels much better.  Still with crackles and +2 pitting edema.  Initial plan was left and right heart cath on Monday however the wife is completely against that today and does not want to do any invasive procedure.  However she demands to check all the vessels of this patient.  I did explain to her that the patient is here for cardiac issues and thus it is indicated to check cardiac vessels.  Seen by cardiology again today.  Per patient's wife's demand, no further plan for cardiac cath as of now and there obtaining coronary CTA.   Patient on aspirin, atorvastatin, losartan and Lasix.  Will defer to cardiology for further management including diuretics.  Hyperlipidemia: Continue atorvastatin.  Essential hypertension: Blood pressure much better controlled.  Continue above-mentioned medications.  New onset type 2 diabetes mellitus: Hemoglobin A1c 6.7.  Continue SSI.  Obesity: Weight loss encouraged.  OSA: Refused CPAP.  DVT prophylaxis: Lovenox   Code Status: DNR  Family Communication: Wife present at bedside. Plan of care discussed with patient in length and he verbalized understanding and agreed with it. Patient is from: Home Disposition Plan: Home Barriers to discharge: Pending further work-up by cardiology.   Estimated body mass index is 33.52 kg/m as calculated from the following:   Height as of this encounter: 5\' 11"  (1.803 m).   Weight as of this encounter: 109 kg.      Nutritional status:               Consultants:   Cardiology  Procedures:   None  Antimicrobials:   None   Subjective: Seen and examined.  Feels much better.  No shortness of breath.  Off of oxygen.  Wife at the bedside.    Objective: Vitals:   04/26/19 1635 04/26/19 2334 04/27/19 0512 04/27/19 0840  BP: 114/81 129/75 105/71 105/67  Pulse: 90 87 87 83  Resp: 17 18 18 19   Temp: 97.6 F (36.4 C)  98.7 F (37.1 C) 98.7 F (37.1 C)   TempSrc: Oral Oral Oral   SpO2: 93% 91% 92% 92%  Weight:   109 kg   Height:        Intake/Output Summary (Last 24 hours) at 04/27/2019 0944 Last data filed at 04/27/2019 9417 Gross per 24 hour  Intake 420 ml  Output 7120 ml  Net -6700 ml   Filed Weights   04/25/19 1349 04/25/19 2211 04/27/19 0512  Weight: 117.9 kg 115.6 kg 109 kg    Examination:  General exam: Appears calm and comfortable  Respiratory system: Crackles at the bases bilaterally. Respiratory effort normal. Cardiovascular system: S1 & S2 heard, RRR. No JVD, murmurs, rubs, gallops or clicks.  +2  pitting edema bilateral lower extremity Gastrointestinal system: Abdomen is nondistended, soft and nontender. No organomegaly or masses felt. Normal bowel sounds heard. Central nervous system: Alert and oriented. No focal neurological deficits. Extremities: Symmetric 5 x 5 power. Skin: No rashes, lesions or ulcers.  Psychiatry: Judgement and insight appear normal. Mood & affect appropriate.      Data Reviewed: I have personally reviewed following labs and imaging studies  CBC: Recent Labs  Lab 04/25/19 1404 04/25/19 2359 04/27/19 0231  WBC 13.3* 12.2* 12.2*  NEUTROABS  --  7.2  --   HGB 16.3 15.3 15.4  HCT 50.9 47.2 48.6  MCV 90.4 89.2 90.0  PLT 300 284 408   Basic Metabolic Panel: Recent Labs  Lab 04/25/19 1404 04/25/19 2359 04/27/19 0231  NA 141 140 143  K 4.7 4.1 4.6  CL 104 103 100  CO2 25 26 32  GLUCOSE 138* 107* 115*  BUN 23 26* 29*  CREATININE 1.00 1.17 1.36*  CALCIUM 9.0 8.3* 9.2   GFR: Estimated Creatinine Clearance: 64.4 mL/min (A) (by C-G formula based on SCr of 1.36 mg/dL (H)). Liver Function Tests: No results for input(s): AST, ALT, ALKPHOS, BILITOT, PROT, ALBUMIN in the last 168 hours. No results for input(s): LIPASE, AMYLASE in the last 168 hours. No results for input(s): AMMONIA in the last 168 hours. Coagulation Profile: No results for input(s): INR, PROTIME in the last 168 hours. Cardiac Enzymes: No results for input(s): CKTOTAL, CKMB, CKMBINDEX, TROPONINI in the last 168 hours. BNP (last 3 results) No results for input(s): PROBNP in the last 8760 hours. HbA1C: Recent Labs    04/25/19 1919  HGBA1C 6.7*   CBG: Recent Labs  Lab 04/26/19 1642  GLUCAP 103*   Lipid Profile: Recent Labs    04/25/19 1919  CHOL 156  HDL 25*  LDLCALC 113*  TRIG 88  CHOLHDL 6.2   Thyroid Function Tests: Recent Labs    04/25/19 1919  TSH 11.128*   Anemia Panel: Recent Labs    04/25/19 1919  FERRITIN 163   Sepsis Labs: No results for  input(s): PROCALCITON, LATICACIDVEN in the last 168 hours.  Recent Results (from the past 240 hour(s))  SARS CORONAVIRUS 2 (TAT 6-24 HRS) Nasopharyngeal Nasopharyngeal Swab     Status: None   Collection Time: 04/25/19  3:51 PM   Specimen: Nasopharyngeal Swab  Result Value Ref Range Status   SARS Coronavirus 2 NEGATIVE NEGATIVE Final    Comment: (NOTE) SARS-CoV-2 target nucleic acids are NOT DETECTED. The SARS-CoV-2 RNA is generally detectable in upper and lower respiratory specimens during the acute phase of infection. Negative results do not preclude SARS-CoV-2 infection, do not rule out co-infections with other pathogens, and should not be used as the sole basis for treatment or  other patient management decisions. Negative results must be combined with clinical observations, patient history, and epidemiological information. The expected result is Negative. Fact Sheet for Patients: SugarRoll.be Fact Sheet for Healthcare Providers: https://www.woods-mathews.com/ This test is not yet approved or cleared by the [place] FDA and  has been authorized for detection and/or diagnosis of SARS-CoV-2 by FDA under an Emergency Use Authorization (EUA). This EUA will remain  in effect (meaning this test can be used) for the duration of the COVID-19 declaration under Section 56 4(b)(1) of the Act, 21 U.S.C. section 360bbb-3(b)(1), unless the authorization is terminated or revoked sooner. Performed at [facility], [place] [address] [place],  [address]       Radiology Studies: DG Chest 2 View  Result Date: 04/25/2019 CLINICAL DATA:  Shortness of breath EXAM: CHEST - 2 VIEW COMPARISON:  04/17/2019 FINDINGS: Mild cardiomegaly. Similar appearance of diffuse bilateral interstitial pulmonary opacity, likely with trace bilateral pleural effusions. No new or focal airspace opacity. IMPRESSION: Similar appearance of diffuse bilateral interstitial  pulmonary opacity, likely with trace bilateral pleural effusions. No new or focal airspace opacity. Findings are consistent with atypical/viral infection or edema. Electronically Signed   By: Eddie Candle M.D.   On: 04/25/2019 14:53   ECHOCARDIOGRAM COMPLETE  Result Date: 04/26/2019    ECHOCARDIOGRAM REPORT   Patient Name:   Blake West [facility] Date of Exam: 04/26/2019 Medical Rec #:  [id number]     Height:       71.0 in Accession #:    [id number]    Weight:       254.8 lb Date of Birth:  22-Jun-1950     BSA:          2.337 m Patient Age:    39 years      BP:           100/67 mmHg Patient Gender: M             HR:           73 bpm. Exam Location:  Inpatient Procedure: 2D Echo, Cardiac Doppler and Color Doppler Indications:    I50.40* Unspecified combined systolic (congestive) and diastolic                 (congestive) heart failure  History:        Patient has no prior history of Echocardiogram examinations.                 CHF, Abnormal ECG, Signs/Symptoms:Shortness of Breath and                 Dyspnea; Risk Factors:Sleep Apnea and Hypertension.  Sonographer:    Roseanna Rainbow RDCS Referring Phys: [id number] JENNIFER YATES  Sonographer Comments: Patient is morbidly obese and Technically difficult study due to poor echo windows. Image acquisition challenging due to patient body habitus. IMPRESSIONS  1. Severe global reduction in LV systolic function; mild LVH; mildly dilated aortic root; mild MR; mild LAE; moderate RV dysfunction.  2. Left ventricular ejection fraction, by estimation, is 20 to 25%. The left ventricle has severely decreased function. The left ventricle demonstrates global hypokinesis. There is mild left ventricular hypertrophy. Left ventricular diastolic function could not be evaluated.  3. Right ventricular systolic function is moderately reduced. The right ventricular size is normal.  4. Left atrial size was mildly dilated.  5. The mitral valve is normal in structure. Mild mitral valve regurgitation. No evidence of  mitral stenosis.  6. The aortic valve  is tricuspid. Aortic valve regurgitation is not visualized. Mild aortic valve sclerosis is present, with no evidence of aortic valve stenosis.  7. Aortic dilatation noted. There is mild dilatation of the aortic root measuring 40 mm.  8. The inferior vena cava is dilated in size with <50% respiratory variability, suggesting right atrial pressure of 15 mmHg. FINDINGS  Left Ventricle: Left ventricular ejection fraction, by estimation, is 20 to 25%. The left ventricle has severely decreased function. The left ventricle demonstrates global hypokinesis. The left ventricular internal cavity size was normal in size. There is mild left ventricular hypertrophy. Left ventricular diastolic function could not be evaluated due to atrial fibrillation. Left ventricular diastolic function could not be evaluated. Right Ventricle: The right ventricular size is normal. Right ventricular systolic function is moderately reduced. Left Atrium: Left atrial size was mildly dilated. Right Atrium: Right atrial size was normal in size. Pericardium: There is no evidence of pericardial effusion. Mitral Valve: The mitral valve is normal in structure. Normal mobility of the mitral valve leaflets. Mild mitral annular calcification. Mild mitral valve regurgitation. No evidence of mitral valve stenosis. Tricuspid Valve: The tricuspid valve is normal in structure. Tricuspid valve regurgitation is trivial. No evidence of tricuspid stenosis. Aortic Valve: The aortic valve is tricuspid. Aortic valve regurgitation is not visualized. Mild aortic valve sclerosis is present, with no evidence of aortic valve stenosis. Pulmonic Valve: The pulmonic valve was normal in structure. Pulmonic valve regurgitation is trivial. No evidence of pulmonic stenosis. Aorta: Aortic dilatation noted. There is mild dilatation of the aortic root measuring 40 mm. Venous: The inferior vena cava is dilated in size with less than 50% respiratory  variability, suggesting right atrial pressure of 15 mmHg.  Additional Comments: Severe global reduction in LV systolic function; mild LVH; mildly dilated aortic root; mild MR; mild LAE; moderate RV dysfunction.  LEFT VENTRICLE PLAX 2D LVIDd:         4.90 cm LVIDs:         4.60 cm LV PW:         1.54 cm LV IVS:        1.32 cm LVOT diam:     2.30 cm LV SV:         81 LV SV Index:   35 LVOT Area:     4.15 cm  LV Volumes (MOD) LV vol d, MOD A2C: 130.0 ml LV vol d, MOD A4C: 88.4 ml LV vol s, MOD A2C: 86.6 ml LV vol s, MOD A4C: 72.3 ml LV SV MOD A2C:     43.5 ml LV SV MOD A4C:     88.4 ml LV SV MOD BP:      29.6 ml RIGHT VENTRICLE         IVC TAPSE (M-mode): 1.2 cm  IVC diam: 2.49 cm LEFT ATRIUM             Index       RIGHT ATRIUM           Index LA diam:        5.10 cm 2.18 cm/m  RA Area:     23.10 cm LA Vol (A2C):   84.4 ml 36.11 ml/m RA Volume:   68.70 ml  29.39 ml/m LA Vol (A4C):   78.3 ml 33.50 ml/m LA Biplane Vol: 82.6 ml 35.34 ml/m  AORTIC VALVE             PULMONIC VALVE LVOT Vmax:   102.00 cm/s PR End Diast Vel: 1.65  msec LVOT Vmean:  76.300 cm/s LVOT VTI:    0.196 m  AORTA Ao Root diam: 4.00 cm Ao Asc diam:  2.90 cm MITRAL VALVE MV Area (PHT): 5.27 cm    SHUNTS MV Decel Time: 144 msec    Systemic VTI:  0.20 m MV E velocity: 93.85 cm/s  Systemic Diam: 2.30 cm Kirk Ruths MD Electronically signed by Kirk Ruths MD Signature Date/Time: 04/26/2019/1:19:44 PM    Final     Scheduled Meds: . aspirin EC  81 mg Oral Daily  . atorvastatin  40 mg Oral q1800  . docusate sodium  100 mg Oral BID  . furosemide  80 mg Intravenous Q12H  . insulin aspart  0-5 Units Subcutaneous QHS  . insulin aspart  0-9 Units Subcutaneous TID WC  . losartan  25 mg Oral Daily  . potassium chloride  20 mEq Oral Daily  . sodium chloride flush  3 mL Intravenous Q12H   Continuous Infusions: . sodium chloride       LOS: 2 days   Time spent: 30 minutes   Darliss Cheney, MD [facility]  04/27/2019, 9:44 AM    To contact the attending provider between 7A-7P or the covering provider during after hours 7P-7A, please log into the web site [email or web address].

## 2019-04-27 NOTE — Progress Notes (Signed)
Progress Note  Patient Name: Blake West Date of Encounter: 04/27/2019  Primary Cardiologist: Harrington Challenger  Subjective   Breathing continues to improve with diuresis   Inpatient Medications    Scheduled Meds: . aspirin EC  81 mg Oral Daily  . atorvastatin  40 mg Oral q1800  . docusate sodium  100 mg Oral BID  . furosemide  80 mg Intravenous Q12H  . insulin aspart  0-5 Units Subcutaneous QHS  . insulin aspart  0-9 Units Subcutaneous TID WC  . losartan  25 mg Oral Daily  . potassium chloride  20 mEq Oral Daily  . sodium chloride flush  3 mL Intravenous Q12H   Continuous Infusions: . sodium chloride     PRN Meds: sodium chloride, acetaminophen **OR** acetaminophen, bisacodyl, HYDROcodone-acetaminophen, morphine injection, ondansetron **OR** ondansetron (ZOFRAN) IV, polyethylene glycol, sodium chloride flush, zolpidem   Vital Signs    Vitals:   04/26/19 1635 04/26/19 2334 04/27/19 0512 04/27/19 0840  BP: 114/81 129/75 105/71 105/67  Pulse: 90 87 87 83  Resp: 17 18 18 19   Temp: 97.6 F (36.4 C) 98.7 F (37.1 C) 98.7 F (37.1 C)   TempSrc: Oral Oral Oral   SpO2: 93% 91% 92% 92%  Weight:   109 kg   Height:        Intake/Output Summary (Last 24 hours) at 04/27/2019 1111 Last data filed at 04/27/2019 1041 Gross per 24 hour  Intake 420 ml  Output 7120 ml  Net -6700 ml   Last 3 Weights 04/27/2019 04/25/2019 04/25/2019  Weight (lbs) 240 lb 4.8 oz 254 lb 12.8 oz 260 lb  Weight (kg) 108.999 kg 115.577 kg 117.935 kg      Telemetry    Sinus rhythm- Personally Reviewed  ECG    Not done   - Personally Reviewed  Physical Exam   GEN: Well nourished, well developed, in no acute distress  HEENT: normal  Neck: 8 cm JVD, carotid bruits, or masses Cardiac: RRR; no murmurs, rubs, or gallops,no edema  Respiratory: crackles at the basesGI: soft, nontender, nondistended, + BS MS: no deformity or atrophy  Skin: warm and dry Neuro:  Strength and sensation are intact Psych: euthymic  mood, full affect   Labs    High Sensitivity Troponin:   Recent Labs  Lab 04/25/19 1404 04/25/19 1551  TROPONINIHS 23* 21*      Chemistry Recent Labs  Lab 04/25/19 1404 04/25/19 2359 04/27/19 0231  NA 141 140 143  K 4.7 4.1 4.6  CL 104 103 100  CO2 25 26 32  GLUCOSE 138* 107* 115*  BUN 23 26* 29*  CREATININE 1.00 1.17 1.36*  CALCIUM 9.0 8.3* 9.2  GFRNONAA >60 >60 53*  GFRAA >60 >60 >60  ANIONGAP 12 11 11      Hematology Recent Labs  Lab 04/25/19 1404 04/25/19 2359 04/27/19 0231  WBC 13.3* 12.2* 12.2*  RBC 5.63 5.29 5.40  HGB 16.3 15.3 15.4  HCT 50.9 47.2 48.6  MCV 90.4 89.2 90.0  MCH 29.0 28.9 28.5  MCHC 32.0 32.4 31.7  RDW 15.3 15.2 15.2  PLT 300 284 287    BNP Recent Labs  Lab 04/25/19 1416  BNP 2,068.6*     DDimer No results for input(s): DDIMER in the last 168 hours.   Radiology    DG Chest 2 View  Result Date: 04/25/2019 CLINICAL DATA:  Shortness of breath EXAM: CHEST - 2 VIEW COMPARISON:  04/17/2019 FINDINGS: Mild cardiomegaly. Similar appearance of diffuse bilateral interstitial pulmonary  opacity, likely with trace bilateral pleural effusions. No new or focal airspace opacity. IMPRESSION: Similar appearance of diffuse bilateral interstitial pulmonary opacity, likely with trace bilateral pleural effusions. No new or focal airspace opacity. Findings are consistent with atypical/viral infection or edema. Electronically Signed   By: Eddie Candle M.D.   On: 04/25/2019 14:53   ECHOCARDIOGRAM COMPLETE  Result Date: 04/26/2019    ECHOCARDIOGRAM REPORT   Patient Name:   Blake West [facility] Date of Exam: 04/26/2019 Medical Rec #:  [id number]     Height:       71.0 in Accession #:    [id number]    Weight:       254.8 lb Date of Birth:  03-Jun-1950     BSA:          2.337 m Patient Age:    69 years      BP:           100/67 mmHg Patient Gender: M             HR:           73 bpm. Exam Location:  Inpatient Procedure: 2D Echo, Cardiac Doppler and Color Doppler  Indications:    I50.40* Unspecified combined systolic (congestive) and diastolic                 (congestive) heart failure  History:        Patient has no prior history of Echocardiogram examinations.                 CHF, Abnormal ECG, Signs/Symptoms:Shortness of Breath and                 Dyspnea; Risk Factors:Sleep Apnea and Hypertension.  Sonographer:    Roseanna Rainbow RDCS Referring Phys: [id number] JENNIFER YATES  Sonographer Comments: Patient is morbidly obese and Technically difficult study due to poor echo windows. Image acquisition challenging due to patient body habitus. IMPRESSIONS  1. Severe global reduction in LV systolic function; mild LVH; mildly dilated aortic root; mild MR; mild LAE; moderate RV dysfunction.  2. Left ventricular ejection fraction, by estimation, is 20 to 25%. The left ventricle has severely decreased function. The left ventricle demonstrates global hypokinesis. There is mild left ventricular hypertrophy. Left ventricular diastolic function could not be evaluated.  3. Right ventricular systolic function is moderately reduced. The right ventricular size is normal.  4. Left atrial size was mildly dilated.  5. The mitral valve is normal in structure. Mild mitral valve regurgitation. No evidence of mitral stenosis.  6. The aortic valve is tricuspid. Aortic valve regurgitation is not visualized. Mild aortic valve sclerosis is present, with no evidence of aortic valve stenosis.  7. Aortic dilatation noted. There is mild dilatation of the aortic root measuring 40 mm.  8. The inferior vena cava is dilated in size with <50% respiratory variability, suggesting right atrial pressure of 15 mmHg. FINDINGS  Left Ventricle: Left ventricular ejection fraction, by estimation, is 20 to 25%. The left ventricle has severely decreased function. The left ventricle demonstrates global hypokinesis. The left ventricular internal cavity size was normal in size. There is mild left ventricular hypertrophy. Left  ventricular diastolic function could not be evaluated due to atrial fibrillation. Left ventricular diastolic function could not be evaluated. Right Ventricle: The right ventricular size is normal. Right ventricular systolic function is moderately reduced. Left Atrium: Left atrial size was mildly dilated. Right Atrium: Right atrial size was normal in size. Pericardium: There is no  evidence of pericardial effusion. Mitral Valve: The mitral valve is normal in structure. Normal mobility of the mitral valve leaflets. Mild mitral annular calcification. Mild mitral valve regurgitation. No evidence of mitral valve stenosis. Tricuspid Valve: The tricuspid valve is normal in structure. Tricuspid valve regurgitation is trivial. No evidence of tricuspid stenosis. Aortic Valve: The aortic valve is tricuspid. Aortic valve regurgitation is not visualized. Mild aortic valve sclerosis is present, with no evidence of aortic valve stenosis. Pulmonic Valve: The pulmonic valve was normal in structure. Pulmonic valve regurgitation is trivial. No evidence of pulmonic stenosis. Aorta: Aortic dilatation noted. There is mild dilatation of the aortic root measuring 40 mm. Venous: The inferior vena cava is dilated in size with less than 50% respiratory variability, suggesting right atrial pressure of 15 mmHg.  Additional Comments: Severe global reduction in LV systolic function; mild LVH; mildly dilated aortic root; mild MR; mild LAE; moderate RV dysfunction.  LEFT VENTRICLE PLAX 2D LVIDd:         4.90 cm LVIDs:         4.60 cm LV PW:         1.54 cm LV IVS:        1.32 cm LVOT diam:     2.30 cm LV SV:         81 LV SV Index:   35 LVOT Area:     4.15 cm  LV Volumes (MOD) LV vol d, MOD A2C: 130.0 ml LV vol d, MOD A4C: 88.4 ml LV vol s, MOD A2C: 86.6 ml LV vol s, MOD A4C: 72.3 ml LV SV MOD A2C:     43.5 ml LV SV MOD A4C:     88.4 ml LV SV MOD BP:      29.6 ml RIGHT VENTRICLE         IVC TAPSE (M-mode): 1.2 cm  IVC diam: 2.49 cm LEFT ATRIUM              Index       RIGHT ATRIUM           Index LA diam:        5.10 cm 2.18 cm/m  RA Area:     23.10 cm LA Vol (A2C):   84.4 ml 36.11 ml/m RA Volume:   68.70 ml  29.39 ml/m LA Vol (A4C):   78.3 ml 33.50 ml/m LA Biplane Vol: 82.6 ml 35.34 ml/m  AORTIC VALVE             PULMONIC VALVE LVOT Vmax:   102.00 cm/s PR End Diast Vel: 1.65 msec LVOT Vmean:  76.300 cm/s LVOT VTI:    0.196 m  AORTA Ao Root diam: 4.00 cm Ao Asc diam:  2.90 cm MITRAL VALVE MV Area (PHT): 5.27 cm    SHUNTS MV Decel Time: 144 msec    Systemic VTI:  0.20 m MV E velocity: 93.85 cm/s  Systemic Diam: 2.30 cm Kirk Ruths MD Electronically signed by Kirk Ruths MD Signature Date/Time: 04/26/2019/1:19:44 PM    Final     Cardiac Studies   Echo:   04/26/19  Patient Profile     69 y.o. male hx of palpitations who presents for increased SOB and swelling   Assessment & Plan    1  CHF: EF 20-25%. Continuing diuresis. Plan was initially for LHC to evaluate coronary arteries but he and his wife would prefer to start with a noninvasive approach. Angie Piercey plan for coronary CTA. Creatinine elevated today. Rosea Dory continue IV  diuresis but may need to go to daily dosing if creatinine continues to increase.   This was discussed at length for 45 minutes with the patient and his wife.  2  Hyperlipidemia: continue lipitor.  For questions or updates, please contact [place] Please consult [email or web address] for contact info under        Signed, Josclyn Rosales Meredith Leeds, MD  04/27/2019, 11:11 AM

## 2019-04-27 NOTE — Progress Notes (Signed)
Pt continue to refuse cpap, wants to wear nasal cannula at night. RT informed pt to call if he changes his mind during the night

## 2019-04-28 LAB — CBC
HCT: 50.5 % (ref 39.0–52.0)
Hemoglobin: 16.3 g/dL (ref 13.0–17.0)
MCH: 28.9 pg (ref 26.0–34.0)
MCHC: 32.3 g/dL (ref 30.0–36.0)
MCV: 89.5 fL (ref 80.0–100.0)
Platelets: 283 10*3/uL (ref 150–400)
RBC: 5.64 MIL/uL (ref 4.22–5.81)
RDW: 15.2 % (ref 11.5–15.5)
WBC: 10.2 10*3/uL (ref 4.0–10.5)
nRBC: 0 % (ref 0.0–0.2)

## 2019-04-28 LAB — BASIC METABOLIC PANEL
Anion gap: 15 (ref 5–15)
BUN: 28 mg/dL — ABNORMAL HIGH (ref 8–23)
CO2: 28 mmol/L (ref 22–32)
Calcium: 8.8 mg/dL — ABNORMAL LOW (ref 8.9–10.3)
Chloride: 99 mmol/L (ref 98–111)
Creatinine, Ser: 1.1 mg/dL (ref 0.61–1.24)
GFR calc Af Amer: >60 mL/min (ref >60)
GFR calc non Af Amer: >60 mL/min (ref >60)
Glucose, Bld: 103 mg/dL — ABNORMAL HIGH (ref 70–99)
Potassium: 3.4 mmol/L — ABNORMAL LOW (ref 3.5–5.1)
Sodium: 142 mmol/L (ref 135–145)

## 2019-04-28 LAB — MAGNESIUM: Magnesium: 2.3 mg/dL (ref 1.7–2.4)

## 2019-04-28 MED ORDER — METOPROLOL TARTRATE 50 MG PO TABS
50.0000 mg | ORAL_TABLET | Freq: Once | ORAL | Status: AC
  Administered 2019-04-28: 50 mg via ORAL
  Filled 2019-04-28: qty 1

## 2019-04-28 MED ORDER — POTASSIUM CHLORIDE CRYS ER 20 MEQ PO TBCR
40.0000 meq | EXTENDED_RELEASE_TABLET | Freq: Once | ORAL | Status: AC
  Administered 2019-04-28: 11:00:00 40 meq via ORAL
  Filled 2019-04-28 (×2): qty 2

## 2019-04-28 NOTE — Progress Notes (Signed)
CCMD reported pt had 7 beat run of vtach  Vital signs documented  RN went to bedside, pt denies chest pain and shortness of breath Pt resting in bed comfortably at this time  Paged cardiology to inform  Will continue to monitor

## 2019-04-28 NOTE — Progress Notes (Signed)
PROGRESS NOTE    Blake West  TKW:[id number] DOB: [birth year] DOA: 04/25/2019 PCP: Patient, No Pcp Per   Brief Narrative:  HPI: Blake West is a 69 y.o. male with medical history significant of hypothyroidism; OSA on CPAP; and HTN presenting with SOB.  He reports LE edema.  He thought he had pneumonia.  He was SOB, coughing mostly nonproductive.  No chest pain.  He has had wheezing.  No fevers.  +orthopnea - "I haven't been able to do that since I was a kid."  +PND.  He uses a CPAP at night.  SOB is worse with exertion.  He is hoping that we are able to improve his symptoms overnight and discharge him to home tomorrow.  He is uncertain if he will be willing to take medications at home.  His wife is Native Optometrist and a Theatre stage manager with a degree in herbatology and they prefer a holistic medicine approach.   ED Course: New-onset CHF - significant edema, markedly elevated BNP.  +cough, SOB.  87% with ambulation.  Assessment & Plan:   Principal Problem:   New onset of congestive heart failure (HCC) Active Problems:   Essential hypertension   OSA on CPAP   Class 2 severe obesity due to excess calories with serious comorbidity and body mass index (BMI) of 36.0 to 36.9 in adult [facility])   Acute systolic (congestive) heart failure (HCC)   Type 2 diabetes mellitus ([place])   Hyperlipidemia  New onset acute systolic congestive heart failure: Echo shows 25% ejection fraction.  Feels much better.  Still with crackles and +2 pitting edema.  Initial plan was left and right heart cath on Monday however the wife does not want to do any invasive procedure and demands to check all the vessels of this patient.  Cardiology on board.  Plan for coronary CTA.  Patient still has crackles but denies any shortness of breath.  Has good urine output with total net -16 535.  Continue current medications including diuretics per cardiology recommendation will defer further management to them.  Hyperlipidemia: Continue  atorvastatin.  Essential hypertension: Blood pressure much better controlled.  Continue current medications.  New onset type 2 diabetes mellitus: Hemoglobin A1c 6.7.  Continue SSI however he is refusing Accu-Cheks.  Obesity: Weight loss encouraged.  OSA: Refused CPAP.  Wants to wear nocturnal oxygen.  Psoriasis/bilateral lower extremity erythema with scabs and scratch marks: Per wife, he has psoriasis and he scratches himself often.  Although it looks erythematous but it is not warm and nontender.  Cannot rule out cellulitis however per wife, she has been applying aloe vera, honey and some other herbal topical medications and according to her, the erythema has improved and she would prefer to do.   DVT prophylaxis: Lovenox   Code Status: DNR  Family Communication: Wife present at bedside. Plan of care discussed with patient in length and he verbalized understanding and agreed with it. Patient is from: Home Disposition Plan: Home Barriers to discharge: Pending further work-up by cardiology.   Estimated body mass index is 33.52 kg/m as calculated from the following:   Height as of this encounter: 5\' 11"  (1.803 m).   Weight as of this encounter: 109 kg.      Nutritional status:               Consultants:   Cardiology  Procedures:   None  Antimicrobials:   None   Subjective: Seen and examined.  Wife at the bedside.  No  complaint.  No shortness of breath.  Objective: Vitals:   04/27/19 1228 04/27/19 1638 04/27/19 2357 04/28/19 0457  BP: 104/69 122/79 107/68 107/78  Pulse: 83 82 99   Resp: 18  18 18   Temp: (!) 97.5 F (36.4 C)  98.6 F (37 C) 99.2 F (37.3 C)  TempSrc: Oral  Oral Oral  SpO2: 91%  93% 91%  Weight:      Height:        Intake/Output Summary (Last 24 hours) at 04/28/2019 0741 Last data filed at 04/28/2019 0715 Gross per 24 hour  Intake 720 ml  Output 5000 ml  Net -4280 ml   Filed Weights   04/25/19 1349 04/25/19 2211 04/27/19 0512   Weight: 117.9 kg 115.6 kg 109 kg    Examination:  General exam: Appears calm and comfortable  Respiratory system: Bibasilar crackles, more on the left than the right. Respiratory effort normal. Cardiovascular system: S1 & S2 heard, RRR. No JVD, murmurs, rubs, gallops or clicks.  +2 pitting edema bilateral lower extremity. Gastrointestinal system: Abdomen is nondistended, soft and nontender. No organomegaly or masses felt. Normal bowel sounds heard. Central nervous system: Alert and oriented. No focal neurological deficits. Extremities: Symmetric 5 x 5 power. Skin: Scattered erythema bilateral lower extremities along with some scabs and scratch marks. Psychiatry: Judgement and insight appear normal. Mood & affect appropriate.    Data Reviewed: I have personally reviewed following labs and imaging studies  CBC: Recent Labs  Lab 04/25/19 1404 04/25/19 2359 04/27/19 0231 04/28/19 0242  WBC 13.3* 12.2* 12.2* 10.2  NEUTROABS  --  7.2  --   --   HGB 16.3 15.3 15.4 16.3  HCT 50.9 47.2 48.6 50.5  MCV 90.4 89.2 90.0 89.5  PLT 300 284 287 030   Basic Metabolic Panel: Recent Labs  Lab 04/25/19 1404 04/25/19 2359 04/27/19 0231 04/28/19 0242  NA 141 140 143 142  K 4.7 4.1 4.6 3.4*  CL 104 103 100 99  CO2 25 26 32 28  GLUCOSE 138* 107* 115* 103*  BUN 23 26* 29* 28*  CREATININE 1.00 1.17 1.36* 1.10  CALCIUM 9.0 8.3* 9.2 8.8*  MG  --   --   --  2.3   GFR: Estimated Creatinine Clearance: 79.6 mL/min (by C-G formula based on SCr of 1.1 mg/dL). Liver Function Tests: No results for input(s): AST, ALT, ALKPHOS, BILITOT, PROT, ALBUMIN in the last 168 hours. No results for input(s): LIPASE, AMYLASE in the last 168 hours. No results for input(s): AMMONIA in the last 168 hours. Coagulation Profile: No results for input(s): INR, PROTIME in the last 168 hours. Cardiac Enzymes: No results for input(s): CKTOTAL, CKMB, CKMBINDEX, TROPONINI in the last 168 hours. BNP (last 3 results) No  results for input(s): PROBNP in the last 8760 hours. HbA1C: Recent Labs    04/25/19 1919  HGBA1C 6.7*   CBG: Recent Labs  Lab 04/26/19 1642  GLUCAP 103*   Lipid Profile: Recent Labs    04/25/19 1919  CHOL 156  HDL 25*  LDLCALC 113*  TRIG 88  CHOLHDL 6.2   Thyroid Function Tests: Recent Labs    04/25/19 1919  TSH 11.128*   Anemia Panel: Recent Labs    04/25/19 1919  FERRITIN 163   Sepsis Labs: No results for input(s): PROCALCITON, LATICACIDVEN in the last 168 hours.  Recent Results (from the past 240 hour(s))  SARS CORONAVIRUS 2 (TAT 6-24 HRS) Nasopharyngeal Nasopharyngeal Swab     Status: None   Collection  Time: 04/25/19  3:51 PM   Specimen: Nasopharyngeal Swab  Result Value Ref Range Status   SARS Coronavirus 2 NEGATIVE NEGATIVE Final    Comment: (NOTE) SARS-CoV-2 target nucleic acids are NOT DETECTED. The SARS-CoV-2 RNA is generally detectable in upper and lower respiratory specimens during the acute phase of infection. Negative results do not preclude SARS-CoV-2 infection, do not rule out co-infections with other pathogens, and should not be used as the sole basis for treatment or other patient management decisions. Negative results must be combined with clinical observations, patient history, and epidemiological information. The expected result is Negative. Fact Sheet for Patients: SugarRoll.be Fact Sheet for Healthcare Providers: https://www.woods-mathews.com/ This test is not yet approved or cleared by the [place] FDA and  has been authorized for detection and/or diagnosis of SARS-CoV-2 by FDA under an Emergency Use Authorization (EUA). This EUA will remain  in effect (meaning this test can be used) for the duration of the COVID-19 declaration under Section 56 4(b)(1) of the Act, 21 U.S.C. section 360bbb-3(b)(1), unless the authorization is terminated or revoked sooner. Performed at [facility], [place] [address] [place], [place] [address]       Radiology Studies: ECHOCARDIOGRAM COMPLETE  Result Date: 04/26/2019    ECHOCARDIOGRAM REPORT   Patient Name:   Blake West [facility] Date of Exam: 04/26/2019 Medical Rec #:  [id number]     Height:       71.0 in Accession #:    [id number]    Weight:       254.8 lb Date of Birth:  [birth year]     BSA:          2.337 m Patient Age:    21 years      BP:           100/67 mmHg Patient Gender: M             HR:           73 bpm. Exam Location:  Inpatient Procedure: 2D Echo, Cardiac Doppler and Color Doppler Indications:    I50.40* Unspecified combined systolic (congestive) and diastolic                 (congestive) heart failure  History:        Patient has no prior history of Echocardiogram examinations.                 CHF, Abnormal ECG, Signs/Symptoms:Shortness of Breath and                 Dyspnea; Risk Factors:Sleep Apnea and Hypertension.  Sonographer:    Roseanna Rainbow RDCS Referring Phys: [id number] JENNIFER YATES  Sonographer Comments: Patient is morbidly obese and Technically difficult study due to poor echo windows. Image acquisition challenging due to patient body habitus. IMPRESSIONS  1. Severe global reduction in LV systolic function; mild LVH; mildly dilated aortic root; mild MR; mild LAE; moderate RV dysfunction.  2. Left ventricular ejection fraction, by estimation, is 20 to 25%. The left ventricle has severely decreased function. The left ventricle demonstrates global hypokinesis. There is mild left ventricular hypertrophy. Left ventricular diastolic function could not be evaluated.  3. Right ventricular systolic function is moderately reduced. The right ventricular size is normal.  4. Left atrial size was mildly dilated.  5. The mitral valve is normal in structure. Mild mitral valve regurgitation. No evidence of mitral stenosis.  6. The aortic valve is tricuspid. Aortic valve regurgitation is not visualized. Mild aortic  valve sclerosis is present, with no  evidence of aortic valve stenosis.  7. Aortic dilatation noted. There is mild dilatation of the aortic root measuring 40 mm.  8. The inferior vena cava is dilated in size with <50% respiratory variability, suggesting right atrial pressure of 15 mmHg. FINDINGS  Left Ventricle: Left ventricular ejection fraction, by estimation, is 20 to 25%. The left ventricle has severely decreased function. The left ventricle demonstrates global hypokinesis. The left ventricular internal cavity size was normal in size. There is mild left ventricular hypertrophy. Left ventricular diastolic function could not be evaluated due to atrial fibrillation. Left ventricular diastolic function could not be evaluated. Right Ventricle: The right ventricular size is normal. Right ventricular systolic function is moderately reduced. Left Atrium: Left atrial size was mildly dilated. Right Atrium: Right atrial size was normal in size. Pericardium: There is no evidence of pericardial effusion. Mitral Valve: The mitral valve is normal in structure. Normal mobility of the mitral valve leaflets. Mild mitral annular calcification. Mild mitral valve regurgitation. No evidence of mitral valve stenosis. Tricuspid Valve: The tricuspid valve is normal in structure. Tricuspid valve regurgitation is trivial. No evidence of tricuspid stenosis. Aortic Valve: The aortic valve is tricuspid. Aortic valve regurgitation is not visualized. Mild aortic valve sclerosis is present, with no evidence of aortic valve stenosis. Pulmonic Valve: The pulmonic valve was normal in structure. Pulmonic valve regurgitation is trivial. No evidence of pulmonic stenosis. Aorta: Aortic dilatation noted. There is mild dilatation of the aortic root measuring 40 mm. Venous: The inferior vena cava is dilated in size with less than 50% respiratory variability, suggesting right atrial pressure of 15 mmHg.  Additional Comments: Severe global reduction in LV systolic function; mild LVH; mildly  dilated aortic root; mild MR; mild LAE; moderate RV dysfunction.  LEFT VENTRICLE PLAX 2D LVIDd:         4.90 cm LVIDs:         4.60 cm LV PW:         1.54 cm LV IVS:        1.32 cm LVOT diam:     2.30 cm LV SV:         81 LV SV Index:   35 LVOT Area:     4.15 cm  LV Volumes (MOD) LV vol d, MOD A2C: 130.0 ml LV vol d, MOD A4C: 88.4 ml LV vol s, MOD A2C: 86.6 ml LV vol s, MOD A4C: 72.3 ml LV SV MOD A2C:     43.5 ml LV SV MOD A4C:     88.4 ml LV SV MOD BP:      29.6 ml RIGHT VENTRICLE         IVC TAPSE (M-mode): 1.2 cm  IVC diam: 2.49 cm LEFT ATRIUM             Index       RIGHT ATRIUM           Index LA diam:        5.10 cm 2.18 cm/m  RA Area:     23.10 cm LA Vol (A2C):   84.4 ml 36.11 ml/m RA Volume:   68.70 ml  29.39 ml/m LA Vol (A4C):   78.3 ml 33.50 ml/m LA Biplane Vol: 82.6 ml 35.34 ml/m  AORTIC VALVE             PULMONIC VALVE LVOT Vmax:   102.00 cm/s PR End Diast Vel: 1.65 msec LVOT Vmean:  76.300 cm/s LVOT VTI:  0.196 m  AORTA Ao Root diam: 4.00 cm Ao Asc diam:  2.90 cm MITRAL VALVE MV Area (PHT): 5.27 cm    SHUNTS MV Decel Time: 144 msec    Systemic VTI:  0.20 m MV E velocity: 93.85 cm/s  Systemic Diam: 2.30 cm Kirk Ruths MD Electronically signed by Kirk Ruths MD Signature Date/Time: 04/26/2019/1:19:44 PM    Final     Scheduled Meds: . aspirin EC  81 mg Oral Daily  . atorvastatin  40 mg Oral q1800  . docusate sodium  100 mg Oral BID  . furosemide  80 mg Intravenous Q12H  . insulin aspart  0-5 Units Subcutaneous QHS  . insulin aspart  0-9 Units Subcutaneous TID WC  . losartan  25 mg Oral Daily  . potassium chloride  20 mEq Oral Daily  . potassium chloride  40 mEq Oral Once  . sodium chloride flush  3 mL Intravenous Q12H   Continuous Infusions: . sodium chloride       LOS: 3 days   Time spent: 29 minutes   Darliss Cheney, MD [facility]  04/28/2019, 7:41 AM   To contact the attending provider between 7A-7P or the covering provider during after hours 7P-7A, please  log into the web site [email or web address].

## 2019-04-28 NOTE — Progress Notes (Signed)
Progress Note  Patient Name: Blake West Date of Encounter: 04/28/2019  Primary Cardiologist: Johnson [address] is improved with diuresis.  Able to lay somewhat flat.  Net out 16 L.  Inpatient Medications    Scheduled Meds: . aspirin EC  81 mg Oral Daily  . atorvastatin  40 mg Oral q1800  . docusate sodium  100 mg Oral BID  . furosemide  80 mg Intravenous Q12H  . insulin aspart  0-5 Units Subcutaneous QHS  . insulin aspart  0-9 Units Subcutaneous TID WC  . losartan  25 mg Oral Daily  . potassium chloride  20 mEq Oral Daily  . potassium chloride  40 mEq Oral Once  . sodium chloride flush  3 mL Intravenous Q12H   Continuous Infusions: . sodium chloride     PRN Meds: sodium chloride, acetaminophen **OR** acetaminophen, bisacodyl, HYDROcodone-acetaminophen, morphine injection, ondansetron **OR** ondansetron (ZOFRAN) IV, polyethylene glycol, sodium chloride flush, zolpidem   Vital Signs    Vitals:   04/27/19 2357 04/28/19 0457 04/28/19 0836 04/28/19 0839  BP: 107/68 107/78  (!) 133/95  Pulse: 99   100  Resp: 18 18  19   Temp: 98.6 F (37 C) 99.2 F (37.3 C)    TempSrc: Oral Oral    SpO2: 93% 91%  91%  Weight:   104.3 kg   Height:        Intake/Output Summary (Last 24 hours) at 04/28/2019 1024 Last data filed at 04/28/2019 0834 Gross per 24 hour  Intake 480 ml  Output 4900 ml  Net -4420 ml   Last 3 Weights 04/28/2019 04/27/2019 04/25/2019  Weight (lbs) 230 lb 240 lb 4.8 oz 254 lb 12.8 oz  Weight (kg) 104.327 kg 108.999 kg 115.577 kg      Telemetry    Sinus rhythm with PVCs- Personally Reviewed  ECG    Not done   - Personally Reviewed  Physical Exam   GEN: Well nourished, well developed, in no acute distress  HEENT: normal  Neck: JVD to the mid neck, carotid bruits, or masses Cardiac: RRR; no murmurs, rubs, or gallops,no edema  Respiratory: Crackles at the bases GI: soft, nontender, nondistended, + BS MS: no deformity or atrophy  Skin: warm and  dry Neuro:  Strength and sensation are intact Psych: euthymic mood, full affect    Labs    High Sensitivity Troponin:   Recent Labs  Lab 04/25/19 1404 04/25/19 1551  TROPONINIHS 23* 21*      Chemistry Recent Labs  Lab 04/25/19 2359 04/27/19 0231 04/28/19 0242  NA 140 143 142  K 4.1 4.6 3.4*  CL 103 100 99  CO2 26 32 28  GLUCOSE 107* 115* 103*  BUN 26* 29* 28*  CREATININE 1.17 1.36* 1.10  CALCIUM 8.3* 9.2 8.8*  GFRNONAA >60 53* >60  GFRAA >60 >60 >60  ANIONGAP 11 11 15      Hematology Recent Labs  Lab 04/25/19 2359 04/27/19 0231 04/28/19 0242  WBC 12.2* 12.2* 10.2  RBC 5.29 5.40 5.64  HGB 15.3 15.4 16.3  HCT 47.2 48.6 50.5  MCV 89.2 90.0 89.5  MCH 28.9 28.5 28.9  MCHC 32.4 31.7 32.3  RDW 15.2 15.2 15.2  PLT 284 287 283    BNP Recent Labs  Lab 04/25/19 1416  BNP 2,068.6*     DDimer No results for input(s): DDIMER in the last 168 hours.   Radiology    ECHOCARDIOGRAM COMPLETE  Result Date: 04/26/2019    ECHOCARDIOGRAM  REPORT   Patient Name:   Blake West [facility] Date of Exam: 04/26/2019 Medical Rec #:  [id number]     Height:       71.0 in Accession #:    [id number]    Weight:       254.8 lb Date of Birth:  29-Jul-1950     BSA:          2.337 m Patient Age:    69 years      BP:           100/67 mmHg Patient Gender: M             HR:           73 bpm. Exam Location:  Inpatient Procedure: 2D Echo, Cardiac Doppler and Color Doppler Indications:    I50.40* Unspecified combined systolic (congestive) and diastolic                 (congestive) heart failure  History:        Patient has no prior history of Echocardiogram examinations.                 CHF, Abnormal ECG, Signs/Symptoms:Shortness of Breath and                 Dyspnea; Risk Factors:Sleep Apnea and Hypertension.  Sonographer:    Roseanna Rainbow RDCS Referring Phys: [id number] JENNIFER YATES  Sonographer Comments: Patient is morbidly obese and Technically difficult study due to poor echo windows. Image acquisition challenging  due to patient body habitus. IMPRESSIONS  1. Severe global reduction in LV systolic function; mild LVH; mildly dilated aortic root; mild MR; mild LAE; moderate RV dysfunction.  2. Left ventricular ejection fraction, by estimation, is 20 to 25%. The left ventricle has severely decreased function. The left ventricle demonstrates global hypokinesis. There is mild left ventricular hypertrophy. Left ventricular diastolic function could not be evaluated.  3. Right ventricular systolic function is moderately reduced. The right ventricular size is normal.  4. Left atrial size was mildly dilated.  5. The mitral valve is normal in structure. Mild mitral valve regurgitation. No evidence of mitral stenosis.  6. The aortic valve is tricuspid. Aortic valve regurgitation is not visualized. Mild aortic valve sclerosis is present, with no evidence of aortic valve stenosis.  7. Aortic dilatation noted. There is mild dilatation of the aortic root measuring 40 mm.  8. The inferior vena cava is dilated in size with <50% respiratory variability, suggesting right atrial pressure of 15 mmHg. FINDINGS  Left Ventricle: Left ventricular ejection fraction, by estimation, is 20 to 25%. The left ventricle has severely decreased function. The left ventricle demonstrates global hypokinesis. The left ventricular internal cavity size was normal in size. There is mild left ventricular hypertrophy. Left ventricular diastolic function could not be evaluated due to atrial fibrillation. Left ventricular diastolic function could not be evaluated. Right Ventricle: The right ventricular size is normal. Right ventricular systolic function is moderately reduced. Left Atrium: Left atrial size was mildly dilated. Right Atrium: Right atrial size was normal in size. Pericardium: There is no evidence of pericardial effusion. Mitral Valve: The mitral valve is normal in structure. Normal mobility of the mitral valve leaflets. Mild mitral annular calcification. Mild  mitral valve regurgitation. No evidence of mitral valve stenosis. Tricuspid Valve: The tricuspid valve is normal in structure. Tricuspid valve regurgitation is trivial. No evidence of tricuspid stenosis. Aortic Valve: The aortic valve is tricuspid. Aortic valve regurgitation is not visualized. Mild aortic  valve sclerosis is present, with no evidence of aortic valve stenosis. Pulmonic Valve: The pulmonic valve was normal in structure. Pulmonic valve regurgitation is trivial. No evidence of pulmonic stenosis. Aorta: Aortic dilatation noted. There is mild dilatation of the aortic root measuring 40 mm. Venous: The inferior vena cava is dilated in size with less than 50% respiratory variability, suggesting right atrial pressure of 15 mmHg.  Additional Comments: Severe global reduction in LV systolic function; mild LVH; mildly dilated aortic root; mild MR; mild LAE; moderate RV dysfunction.  LEFT VENTRICLE PLAX 2D LVIDd:         4.90 cm LVIDs:         4.60 cm LV PW:         1.54 cm LV IVS:        1.32 cm LVOT diam:     2.30 cm LV SV:         81 LV SV Index:   35 LVOT Area:     4.15 cm  LV Volumes (MOD) LV vol d, MOD A2C: 130.0 ml LV vol d, MOD A4C: 88.4 ml LV vol s, MOD A2C: 86.6 ml LV vol s, MOD A4C: 72.3 ml LV SV MOD A2C:     43.5 ml LV SV MOD A4C:     88.4 ml LV SV MOD BP:      29.6 ml RIGHT VENTRICLE         IVC TAPSE (M-mode): 1.2 cm  IVC diam: 2.49 cm LEFT ATRIUM             Index       RIGHT ATRIUM           Index LA diam:        5.10 cm 2.18 cm/m  RA Area:     23.10 cm LA Vol (A2C):   84.4 ml 36.11 ml/m RA Volume:   68.70 ml  29.39 ml/m LA Vol (A4C):   78.3 ml 33.50 ml/m LA Biplane Vol: 82.6 ml 35.34 ml/m  AORTIC VALVE             PULMONIC VALVE LVOT Vmax:   102.00 cm/s PR End Diast Vel: 1.65 msec LVOT Vmean:  76.300 cm/s LVOT VTI:    0.196 m  AORTA Ao Root diam: 4.00 cm Ao Asc diam:  2.90 cm MITRAL VALVE MV Area (PHT): 5.27 cm    SHUNTS MV Decel Time: 144 msec    Systemic VTI:  0.20 m MV E velocity: 93.85  cm/s  Systemic Diam: 2.30 cm Kirk Ruths MD Electronically signed by Kirk Ruths MD Signature Date/Time: 04/26/2019/1:19:44 PM    Final     Cardiac Studies   Echo:   04/26/19  Patient Profile     69 y.o. male hx of palpitations who presents for increased SOB and swelling   Assessment & Plan    1  CHF: EF 20-25%.  Continue to diurese well, net -16 L.  Plan for coronary CTA today.  We Ravindra Baranek give him metoprolol to hopefully help control his heart rate.     2  Hyperlipidemia: Continue Lipitor  For questions or updates, please contact [place] Please consult [email or web address] for contact info under        Signed, Dewie Ahart Meredith Leeds, MD  04/28/2019, 10:24 AM

## 2019-04-28 NOTE — Progress Notes (Signed)
CM consulted per RN for nasal pillow for his CPAP. CM met with the patient and his spouse and he has a nose mask at home but rips it off during the night and respiratory recommended a nasal pillow. MD updated and orders sent to [facility].  TOC following for further d/c needs.  

## 2019-04-29 ENCOUNTER — Inpatient Hospital Stay (HOSPITAL_COMMUNITY): Payer: PPO

## 2019-04-29 ENCOUNTER — Other Ambulatory Visit (HOSPITAL_COMMUNITY): Payer: PPO

## 2019-04-29 ENCOUNTER — Encounter (HOSPITAL_COMMUNITY)
Admission: EM | Disposition: A | Discharge status: Home / Self Care | Payer: Self-pay | Source: Home / Self Care | Attending: Family Medicine

## 2019-04-29 DIAGNOSIS — E785 Hyperlipidemia, unspecified: Secondary | ICD-10-CM

## 2019-04-29 DIAGNOSIS — E119 Type 2 diabetes mellitus without complications: Secondary | ICD-10-CM

## 2019-04-29 DIAGNOSIS — I471 Supraventricular tachycardia: Secondary | ICD-10-CM

## 2019-04-29 LAB — BASIC METABOLIC PANEL
Anion gap: 14 (ref 5–15)
BUN: 31 mg/dL — ABNORMAL HIGH (ref 8–23)
CO2: 27 mmol/L (ref 22–32)
Calcium: 8.8 mg/dL — ABNORMAL LOW (ref 8.9–10.3)
Chloride: 99 mmol/L (ref 98–111)
Creatinine, Ser: 1.18 mg/dL (ref 0.61–1.24)
GFR calc Af Amer: >60 mL/min (ref >60)
GFR calc non Af Amer: >60 mL/min (ref >60)
Glucose, Bld: 118 mg/dL — ABNORMAL HIGH (ref 70–99)
Potassium: 3.8 mmol/L (ref 3.5–5.1)
Sodium: 140 mmol/L (ref 135–145)

## 2019-04-29 LAB — CBC
HCT: 50.9 % (ref 39.0–52.0)
Hemoglobin: 16.3 g/dL (ref 13.0–17.0)
MCH: 28.6 pg (ref 26.0–34.0)
MCHC: 32 g/dL (ref 30.0–36.0)
MCV: 89.3 fL (ref 80.0–100.0)
Platelets: 303 10*3/uL (ref 150–400)
RBC: 5.7 MIL/uL (ref 4.22–5.81)
RDW: 15.5 % (ref 11.5–15.5)
WBC: 13.9 10*3/uL — ABNORMAL HIGH (ref 4.0–10.5)
nRBC: 0 % (ref 0.0–0.2)

## 2019-04-29 SURGERY — RIGHT/LEFT HEART CATH AND CORONARY ANGIOGRAPHY
Anesthesia: LOCAL

## 2019-04-29 MED ORDER — LIVING BETTER WITH HEART FAILURE BOOK
Freq: Once | Status: AC
  Administered 2019-04-29: 1

## 2019-04-29 MED ORDER — POTASSIUM CHLORIDE CRYS ER 20 MEQ PO TBCR
40.0000 meq | EXTENDED_RELEASE_TABLET | Freq: Two times a day (BID) | ORAL | Status: DC
  Administered 2019-04-29 – 2019-04-30 (×3): 40 meq via ORAL
  Filled 2019-04-29 (×4): qty 2

## 2019-04-29 MED ORDER — POTASSIUM CHLORIDE CRYS ER 20 MEQ PO TBCR
20.0000 meq | EXTENDED_RELEASE_TABLET | Freq: Once | ORAL | Status: AC
  Administered 2019-04-29: 12:00:00 20 meq via ORAL
  Filled 2019-04-29: qty 1

## 2019-04-29 NOTE — Progress Notes (Signed)
PROGRESS NOTE    Blake West  UXN:[id number] DOB: 05/29/1950 DOA: 04/25/2019 PCP: Patient, No Pcp Per   Brief Narrative:  HPI: Blake West is a 69 y.o. male with medical history significant of hypothyroidism; OSA on CPAP; and HTN presenting with SOB.  He reports LE edema.  He thought he had pneumonia.  He was SOB, coughing mostly nonproductive.  No chest pain.  He has had wheezing.  No fevers.  +orthopnea - "I haven't been able to do that since I was a kid."  +PND.  He uses a CPAP at night.  SOB is worse with exertion.  He is hoping that we are able to improve his symptoms overnight and discharge him to home tomorrow.  He is uncertain if he will be willing to take medications at home.  His wife is Native Optometrist and a Theatre stage manager with a degree in herbatology and they prefer a holistic medicine approach.   ED Course: New-onset CHF - significant edema, markedly elevated BNP.  +cough, SOB.  87% with ambulation.  Assessment & Plan:   Principal Problem:   New onset of congestive heart failure (HCC) Active Problems:   Essential hypertension   OSA on CPAP   Class 2 severe obesity due to excess calories with serious comorbidity and body mass index (BMI) of 36.0 to 36.9 in adult [facility])   Acute systolic (congestive) heart failure (HCC)   Type 2 diabetes mellitus ([place])   Hyperlipidemia  New onset acute systolic congestive heart failure/paroxysmal NSVT, PAC, PVCs: Echo shows 25% ejection fraction. Initial plan was left and right heart cath on Monday however the wife does not want to do any invasive procedure and would like noninvasive measures for now. They have traditionally shied away from [place] in lieu of conservative, holistic approach. Wife reports they had family members with complications from more invasive approaches and are therefore wary. Cardiology on board.  Plan for coronary CTA however this could not be achieved due to elevated heart rate despite a beta-blocker.   Continues to have good urine output with net negative of 19 L since admission or perhaps more.  Feels better.  No crackles on my exam.  Cardiology on board.  Patient has been refusing statin, aspirin and losartan.  He discussed with cardiology today.  No willing to take aspirin but no statin or losartan as of yet.  He continues to be on diuretics.  Management per cardiology.   Hyperlipidemia: Continue atorvastatin (patient refusing).  Essential hypertension: Blood pressure much better controlled.  Continue current medications.  New onset type 2 diabetes mellitus: Hemoglobin A1c 6.7.  Continue SSI however he is refusing Accu-Cheks.  Obesity: Weight loss encouraged.  OSA: Refused CPAP.  Wants to wear nocturnal oxygen.  Psoriasis/bilateral lower extremity erythema with scabs and scratch marks: Per wife, he has psoriasis and he scratches himself often.  Although it looks erythematous but it is not warm and nontender.  This is improving now.  He wants to continue traditional topical treatment for this.    DVT prophylaxis: Lovenox   Code Status: DNR  Family Communication: None present at bedside.  Plan of care discussed with patient in length and he verbalized understanding and agreed with it. Patient is from: Home Disposition Plan: Home Barriers to discharge: Pending further work-up by cardiology.   Estimated body mass index is 32.08 kg/m as calculated from the following:   Height as of this encounter: 5\' 11"  (1.803 m).   Weight as of this encounter:  104.3 kg.      Nutritional status:               Consultants:   Cardiology  Procedures:   None  Antimicrobials:   None   Subjective: Seen and examined.  Denied any shortness of breath or any other complaint.  Objective: Vitals:   04/28/19 1652 04/28/19 1806 04/28/19 2359 04/29/19 0610  BP: 99/65 100/82 106/68 (!) 110/56  Pulse: 83 99 96 89  Resp: 18  19 17   Temp: 99.4 F (37.4 C)  98.9 F (37.2 C) 99 F  (37.2 C)  TempSrc: Oral  Oral Oral  SpO2: 91%  91% 92%  Weight:      Height:        Intake/Output Summary (Last 24 hours) at 04/29/2019 0829 Last data filed at 04/29/2019 0800 Gross per 24 hour  Intake 720 ml  Output 4400 ml  Net -3680 ml   Filed Weights   04/25/19 2211 04/27/19 0512 04/28/19 0836  Weight: 115.6 kg 109 kg 104.3 kg    Examination:  General exam: Appears calm and comfortable  Respiratory system: Clear to auscultation. Respiratory effort normal. Cardiovascular system: S1 & S2 heard, RRR. No JVD, murmurs, rubs, gallops or clicks.  +1 pitting edema bilateral lower extremity Gastrointestinal system: Abdomen is nondistended, soft and nontender. No organomegaly or masses felt. Normal bowel sounds heard. Central nervous system: Alert and oriented. No focal neurological deficits. Extremities: Symmetric 5 x 5 power. Skin: No rashes, lesions or ulcers.  Psychiatry: Judgement and insight appear poor.  Mood & affect appropriate.    Data Reviewed: I have personally reviewed following labs and imaging studies  CBC: Recent Labs  Lab 04/25/19 1404 04/25/19 2359 04/27/19 0231 04/28/19 0242 04/29/19 0147  WBC 13.3* 12.2* 12.2* 10.2 13.9*  NEUTROABS  --  7.2  --   --   --   HGB 16.3 15.3 15.4 16.3 16.3  HCT 50.9 47.2 48.6 50.5 50.9  MCV 90.4 89.2 90.0 89.5 89.3  PLT 300 284 287 283 782   Basic Metabolic Panel: Recent Labs  Lab 04/25/19 1404 04/25/19 2359 04/27/19 0231 04/28/19 0242 04/29/19 0147  NA 141 140 143 142 140  K 4.7 4.1 4.6 3.4* 3.8  CL 104 103 100 99 99  CO2 25 26 32 28 27  GLUCOSE 138* 107* 115* 103* 118*  BUN 23 26* 29* 28* 31*  CREATININE 1.00 1.17 1.36* 1.10 1.18  CALCIUM 9.0 8.3* 9.2 8.8* 8.8*  MG  --   --   --  2.3  --    GFR: Estimated Creatinine Clearance: 72.6 mL/min (by C-G formula based on SCr of 1.18 mg/dL). Liver Function Tests: No results for input(s): AST, ALT, ALKPHOS, BILITOT, PROT, ALBUMIN in the last 168 hours. No results  for input(s): LIPASE, AMYLASE in the last 168 hours. No results for input(s): AMMONIA in the last 168 hours. Coagulation Profile: No results for input(s): INR, PROTIME in the last 168 hours. Cardiac Enzymes: No results for input(s): CKTOTAL, CKMB, CKMBINDEX, TROPONINI in the last 168 hours. BNP (last 3 results) No results for input(s): PROBNP in the last 8760 hours. HbA1C: No results for input(s): HGBA1C in the last 72 hours. CBG: Recent Labs  Lab 04/26/19 1642  GLUCAP 103*   Lipid Profile: No results for input(s): CHOL, HDL, LDLCALC, TRIG, CHOLHDL, LDLDIRECT in the last 72 hours. Thyroid Function Tests: No results for input(s): TSH, T4TOTAL, FREET4, T3FREE, THYROIDAB in the last 72 hours. Anemia Panel: No results  for input(s): VITAMINB12, FOLATE, FERRITIN, TIBC, IRON, RETICCTPCT in the last 72 hours. Sepsis Labs: No results for input(s): PROCALCITON, LATICACIDVEN in the last 168 hours.  Recent Results (from the past 240 hour(s))  SARS CORONAVIRUS 2 (TAT 6-24 HRS) Nasopharyngeal Nasopharyngeal Swab     Status: None   Collection Time: 04/25/19  3:51 PM   Specimen: Nasopharyngeal Swab  Result Value Ref Range Status   SARS Coronavirus 2 NEGATIVE NEGATIVE Final    Comment: (NOTE) SARS-CoV-2 target nucleic acids are NOT DETECTED. The SARS-CoV-2 RNA is generally detectable in upper and lower respiratory specimens during the acute phase of infection. Negative results do not preclude SARS-CoV-2 infection, do not rule out co-infections with other pathogens, and should not be used as the sole basis for treatment or other patient management decisions. Negative results must be combined with clinical observations, patient history, and epidemiological information. The expected result is Negative. Fact Sheet for Patients: SugarRoll.be Fact Sheet for Healthcare Providers: https://www.woods-mathews.com/ This test is not yet approved or cleared by  the [place] FDA and  has been authorized for detection and/or diagnosis of SARS-CoV-2 by FDA under an Emergency Use Authorization (EUA). This EUA will remain  in effect (meaning this test can be used) for the duration of the COVID-19 declaration under Section 56 4(b)(1) of the Act, 21 U.S.C. section 360bbb-3(b)(1), unless the authorization is terminated or revoked sooner. Performed at [facility], [place] [address] [place], [place] [address]       Radiology Studies: No results found.  Scheduled Meds: . aspirin EC  81 mg Oral Daily  . atorvastatin  40 mg Oral q1800  . docusate sodium  100 mg Oral BID  . furosemide  80 mg Intravenous Q12H  . insulin aspart  0-5 Units Subcutaneous QHS  . insulin aspart  0-9 Units Subcutaneous TID WC  . losartan  25 mg Oral Daily  . potassium chloride  20 mEq Oral Daily  . sodium chloride flush  3 mL Intravenous Q12H   Continuous Infusions: . sodium chloride       LOS: 4 days   Time spent: 28 minutes   Darliss Cheney, MD [facility]  04/29/2019, 8:29 AM   To contact the attending provider between 7A-7P or the covering provider during after hours 7P-7A, please log into the web site [email or web address].

## 2019-04-29 NOTE — Progress Notes (Signed)
AT the said time, RN was notified about patient having 11 beats of V-Tach. Denied CP or any pain. On-call provider notified. No new orders given

## 2019-04-29 NOTE — TOC Initial Note (Signed)
Transition of Care [facility]) - Initial/Assessment Note    Patient Details  Name: Blake West MRN: [id number] Date of Birth: 03-24-1950  [facility] CM/SW Contact:    Marilu Favre, RN Phone Number: 04/29/2019, 11:34 AM  Clinical Narrative:                  Spoke to patient and wife Susie at bedside. Confirmed face sheet information.  Patient needs nasal pillow for CPAP, previous NCM ordered through [facility].   NCM called to follow up on status of order. Spoke with Zac with [facility].   Patient has an account with [facility] however, no record of CPAP.   Confirmed with patient and Susie , patient received CPAP in 2005 or 2006 through [facility]. Susie went to [place] store in [facility] and picked up CPAP.   [facility] has brought DME department of [facility], records should have forwarded. Called Zac back and left message, awaiting call back.  Expected Discharge Plan: Home/Self Care Barriers to Discharge: Continued Medical Work up   Patient Goals and CMS Choice Patient states their goals for this hospitalization and ongoing recovery are:: to return to home CMS Medicare.gov Compare Post Acute Care list provided to:: Patient Choice offered to / list presented to : NA  Expected Discharge Plan and Services Expected Discharge Plan: Home/Self Care   Discharge Planning Services: CM Consult Post Acute Care Choice: Durable Medical Equipment Living arrangements for the past 2 months: Single Family Home                 DME Arranged: Other see comment(nasal pillow for CPAP) DME Agency: [facility] Date DME Agency Contacted: 04/29/19 Time DME Agency Contacted: 16 Representative spoke with at DME Agency: [place]: NA          Prior Living Arrangements/Services Living arrangements for the past 2 months: Single Family Home Lives with:: Spouse Patient language and need for interpreter reviewed:: Yes Do you feel safe going back  to the place where you live?: Yes      Need for Family Participation in Patient Care: Yes (Comment) Care giver support system in place?: Yes (comment) Current home services: DME Criminal Activity/Legal Involvement Pertinent to Current Situation/Hospitalization: No - Comment as needed  Activities of Daily Living Home Assistive Devices/Equipment: CPAP, Dentures (specify type), CBG Meter, Walker (specify type)(rollator; pt states he does not use) ADL Screening (condition at time of admission) Patient's cognitive ability adequate to safely complete daily activities?: Yes Is the patient deaf or have difficulty hearing?: No Does the patient have difficulty seeing, even when wearing glasses/contacts?: No Does the patient have difficulty concentrating, remembering, or making decisions?: No Patient able to express need for assistance with ADLs?: Yes Does the patient have difficulty dressing or bathing?: No Independently performs ADLs?: Yes (appropriate for developmental age) Does the patient have difficulty walking or climbing stairs?: No(does have SOB with exertion) Weakness of Legs: None Weakness of Arms/Hands: None  Permission Sought/Granted   Permission granted to share information with : Yes, Verbal Permission Granted  Share Information with NAME: wife Susie           Emotional Assessment Appearance:: Appears stated age Attitude/Demeanor/Rapport: Engaged Affect (typically observed): Accepting Orientation: : Oriented to Self, Oriented to Place, Oriented to  Time, Oriented to Situation Alcohol / Substance Use: Not Applicable Psych Involvement: No (comment)  Admission diagnosis:  New onset of congestive heart failure (HCC) [I50.9] Acute congestive heart  failure, unspecified heart failure type [facility]) [I50.9] Patient Active Problem List   Diagnosis Date Noted  . Acute systolic (congestive) heart failure ([place]) 04/26/2019  . Type 2 diabetes mellitus ([place]) 04/26/2019  . Hyperlipidemia  04/26/2019  . New onset of congestive heart failure ([place]) 04/25/2019  . Essential hypertension 04/25/2019  . OSA on CPAP 04/25/2019  . Class 2 severe obesity due to excess calories with serious comorbidity and body mass index (BMI) of 36.0 to 36.9 in adult [facility]) 04/25/2019   PCP:  Patient, No Pcp Per Pharmacy:   [facility] [address] ([address]), [place] [address] [place] [address] Phone: [phone] Fax: [phone]     Social Determinants of Health (SDOH) Interventions    Readmission Risk Interventions No flowsheet data found.

## 2019-04-29 NOTE — Progress Notes (Signed)
CCMD notified RN another 4 beats of v-tach and multifocal PVC'S. On call provider notified. Patient asymptomatic.

## 2019-04-29 NOTE — Progress Notes (Signed)
TELE called and said pt had a 13 beat run of VTACH @13 :49.  Pt has been up in the chair 2.5hrs,  Just returned to bed,  No complaints of CP or increase SOB.  Sent message to Dr Doristine Bosworth regarding the said incident.   Pt is resting and watching TV at this itime

## 2019-04-29 NOTE — Progress Notes (Addendum)
Progress Note  Patient Name: Blake West Date of Encounter: 04/29/2019  Primary Cardiologist: Dorris Carnes, MD  Subjective   Patient's edema has improved. Breathing is not totally back to baseline but making progress. He's eager to get out of bed. Per MAR, patient refusing traditional cardiac meds including aspirin, statin, losartan. He is accepting Lasix and potassium. He declined cath, requesting non-invasive workup instead. Coronary CTA was arranged he was unable to get HR low enough for scan despite beta blocker therapy yesterday. Telemetry today shows NSR/borderline sinus tach with frequent ectopy, predominantly PACs, therefore he would not be a candidate to proceed today.  Long discussion with patient and wife. They have traditionally shied away from [facility] in lieu of conservative, holistic approach. Wife reports they had family members with complications from more invasive approaches and are therefore wary. Wife is also very hesitant about medications simply being added without explanation, especially after reading side effect profiles online. She reports he had muscle cramping 15 minutes after losartan. They also decline to take any statins at this time. We discussed numerous aspects of heart failure management including recurrent nature if poorly treated, inherent mortality risk, and importance of identifying potential cardiac blockages so that they may be fixed to reduce future risk of sudden cardiac death. Wife states they have not completely ruled out the idea of cath but strongly prefer "passive management" approach by letting him rest first.   Inpatient Medications    Scheduled Meds: . aspirin EC  81 mg Oral Daily  . atorvastatin  40 mg Oral q1800  . docusate sodium  100 mg Oral BID  . furosemide  80 mg Intravenous Q12H  . insulin aspart  0-5 Units Subcutaneous QHS  . insulin aspart  0-9 Units Subcutaneous TID WC  . losartan  25 mg Oral Daily  . potassium chloride   20 mEq Oral Daily  . sodium chloride flush  3 mL Intravenous Q12H   Continuous Infusions: . sodium chloride     PRN Meds: sodium chloride, acetaminophen **OR** acetaminophen, bisacodyl, HYDROcodone-acetaminophen, morphine injection, ondansetron **OR** ondansetron (ZOFRAN) IV, polyethylene glycol, sodium chloride flush, zolpidem   Vital Signs    Vitals:   04/28/19 1652 04/28/19 1806 04/28/19 2359 04/29/19 0610  BP: 99/65 100/82 106/68 (!) 110/56  Pulse: 83 99 96 89  Resp: 18  19 17   Temp: 99.4 F (37.4 C)  98.9 F (37.2 C) 99 F (37.2 C)  TempSrc: Oral  Oral Oral  SpO2: 91%  91% 92%  Weight:      Height:        Intake/Output Summary (Last 24 hours) at 04/29/2019 1027 Last data filed at 04/29/2019 0800 Gross per 24 hour  Intake 720 ml  Output 3800 ml  Net -3080 ml   Last 3 Weights 04/28/2019 04/27/2019 04/25/2019  Weight (lbs) 230 lb 240 lb 4.8 oz 254 lb 12.8 oz  Weight (kg) 104.327 kg 108.999 kg 115.577 kg     Telemetry    NSR/borderline sinus tach with frequent PACs, occasional PVCs, brief runs NSVT 4-11 beats- Personally Reviewed  Physical Exam   GEN: No acute distress.  HEENT: Normocephalic, atraumatic, sclera non-icteric. Neck: No JVD or bruits. Cardiac: RRR no murmurs, rubs, or gallops.  Radials/DP/PT 1+ and equal bilaterally.  Respiratory: Crackles at L>R base. No wheezes, rales or rhonchi. Breathing is unlabored. GI: Soft, nontender, non-distended, BS +x 4. MS: no deformity. Extremities: No clubbing or cyanosis. No significant edema; psoriatric changes. Distal pedal pulses are  2+ and equal bilaterally. Neuro:  AAOx3. Follows commands. Psych:  Responds to questions appropriately with a normal affect.  Labs    High Sensitivity Troponin:   Recent Labs  Lab 04/25/19 1404 04/25/19 1551  TROPONINIHS 23* 21*      Cardiac EnzymesNo results for input(s): TROPONINI in the last 168 hours. No results for input(s): TROPIPOC in the last 168 hours.   Chemistry Recent  Labs  Lab 04/27/19 0231 04/28/19 0242 04/29/19 0147  NA 143 142 140  K 4.6 3.4* 3.8  CL 100 99 99  CO2 32 28 27  GLUCOSE 115* 103* 118*  BUN 29* 28* 31*  CREATININE 1.36* 1.10 1.18  CALCIUM 9.2 8.8* 8.8*  GFRNONAA 53* >60 >60  GFRAA >60 >60 >60  ANIONGAP 11 15 14      Hematology Recent Labs  Lab 04/27/19 0231 04/28/19 0242 04/29/19 0147  WBC 12.2* 10.2 13.9*  RBC 5.40 5.64 5.70  HGB 15.4 16.3 16.3  HCT 48.6 50.5 50.9  MCV 90.0 89.5 89.3  MCH 28.5 28.9 28.6  MCHC 31.7 32.3 32.0  RDW 15.2 15.2 15.5  PLT 287 283 303    BNP Recent Labs  Lab 04/25/19 1416  BNP 2,068.6*     DDimer No results for input(s): DDIMER in the last 168 hours.   Radiology    No results found.  Cardiac Studies   2D echo 04/26/19 1. Severe global reduction in LV systolic function; mild LVH; mildly  dilated aortic root; mild MR; mild LAE; moderate RV dysfunction.  2. Left ventricular ejection fraction, by estimation, is 20 to 25%. The  left ventricle has severely decreased function. The left ventricle  demonstrates global hypokinesis. There is mild left ventricular  hypertrophy. Left ventricular diastolic function  could not be evaluated.  3. Right ventricular systolic function is moderately reduced. The right  ventricular size is normal.  4. Left atrial size was mildly dilated.  5. The mitral valve is normal in structure. Mild mitral valve  regurgitation. No evidence of mitral stenosis.  6. The aortic valve is tricuspid. Aortic valve regurgitation is not  visualized. Mild aortic valve sclerosis is present, with no evidence of  aortic valve stenosis.  7. Aortic dilatation noted. There is mild dilatation of the aortic root  measuring 40 mm.  8. The inferior vena cava is dilated in size with <50% respiratory  variability, suggesting right atrial pressure of 15 mmHg  Patient Profile     69 y.o. male with history of hypothyroidism, OSA on CPAP, obesity, HTN and palpitations  (PACs/PVCs by prior monitor) who was admitted with worsening SOB, edema, nonproductive cough, wheezing and orthopnea. Found to have uncontrolled HTN and new onset CHF. 2D echo shows severe LV dysfunction with EF 20-25%, moderately reduced RV function, mild LVH, mild LAE, mild MR, mild dilation of aortic root. Covid test negative. Per admitting H/P "He is uncertain if he will be willing to take medications at home. His wife is Native Optometrist and a Theatre stage manager with a degree in herbatology and they prefer a holistic medicine approach."  Assessment & Plan    1. Acute systolic biventricular heart failure with newly diagnosed cardiomyopathy and hypoxia - continues to diurese with IV Lasix. Admit weight was either 260 or 254.8 (both entered 4/1), down to 230lb today. He is net -19L thus far, potentially more given occasional unmeasured urine occurrences. Pulse ox remains low 90's on supplemental O2. He still has crackles on exam. Renal function is holding steady. Would continue  IV Lasix as you are doing. Will need eval for O2 needs prior to discharge. At this time, patient declines cardiac cath but they have not completely ruled out the idea in the future. They would prefer to get him through this hospitalization and then reconsider. Will cancel coronary CTA (unable to get HR low enough, frequent ectopy). Nuclear stress test would not necessarily be reassuring even if negative given his LV dysfunction and NSVT. Will provide HF literature with the CHF booklet today. I discussed at length the mechanism of the different medicines we use for heart failure, especially if the desire is for conservative approach only. The patient and wife feel he had muscle cramping due to losartan. I feel this was more likely due to hypokalemia/diuresis. After our discussion they are amenable to a trial of low dose ACEI/ARB and BB. I do not know that he would have the blood pressure for Entresto. I will discuss the med regimen with Dr.  Margaretann Loveless. They are also now willing to take a low dose enteric coated aspirin.  2. Newly recognized diabetes mellitus - A1C 6.7, further management per IM.  3. NSVT, PACs, PVCs - will discuss beta blocker with MD. Blood pressure tends to be soft at times. K 3.8. Will increase supplemental K.  4. AKI - peak Cr 1.36, improved to 1.18. No recent values on file to compare to. Continue to monitor.  5. OSA - pt has declined formal CPAP, is being offered nasal pillow which IM is helping to arrange.  6. Hyperlipidemia - patient refuses statin therapy at this time, will discontinue.  7. Hypothyroidism - TSH 11.1. Further per IM.  For questions or updates, please contact [place] Please consult [email or web address] for contact info under Cardiology/STEMI.  Signed, Blake Pitter, PA-C 04/29/2019, 10:27 AM    Patient seen and examined with Melina Copa PA-C.  Agree as above, with the following exceptions and changes as noted below. Agree with discussion as above. Discussed with patient (wife was not in room during my rounds). Gen: NAD, CV: RRR, no murmurs, Lungs: bibasilar crackles, Abd: soft, Extrem: Warm, well perfused, trace edema, Neuro/Psych: alert and oriented x 3, normal mood and affect. All available labs, radiology testing, previous records reviewed. After discussion with Melina Copa, family may be amenable to starting ASA 81 mg daily. Would consider starting metoprolol succinate 25 mg daily, with uptitration if tolerated. If that is well tolerated, would consider starting irbesartan 37.5 mg daily.   All medications should be reviewed with patient and family prior to prescribing.   Elouise Munroe 04/29/19 2:38 PM

## 2019-04-30 ENCOUNTER — Encounter (HOSPITAL_COMMUNITY)
Admission: EM | Disposition: A | Discharge status: Home / Self Care | Payer: Self-pay | Source: Home / Self Care | Attending: Family Medicine

## 2019-04-30 DIAGNOSIS — I2510 Atherosclerotic heart disease of native coronary artery without angina pectoris: Secondary | ICD-10-CM

## 2019-04-30 DIAGNOSIS — I429 Cardiomyopathy, unspecified: Secondary | ICD-10-CM

## 2019-04-30 HISTORY — PX: RIGHT/LEFT HEART CATH AND CORONARY ANGIOGRAPHY: CATH118266

## 2019-04-30 LAB — POCT I-STAT EG7
Acid-Base Excess: 5 mmol/L — ABNORMAL HIGH (ref 0.0–2.0)
Acid-Base Excess: 5 mmol/L — ABNORMAL HIGH (ref 0.0–2.0)
Bicarbonate: 29.8 mmol/L — ABNORMAL HIGH (ref 20.0–28.0)
Bicarbonate: 30.9 mmol/L — ABNORMAL HIGH (ref 20.0–28.0)
Calcium, Ion: 1.12 mmol/L — ABNORMAL LOW (ref 1.15–1.40)
Calcium, Ion: 1.14 mmol/L — ABNORMAL LOW (ref 1.15–1.40)
HCT: 54 % — ABNORMAL HIGH (ref 39.0–52.0)
HCT: 55 % — ABNORMAL HIGH (ref 39.0–52.0)
Hemoglobin: 18.4 g/dL — ABNORMAL HIGH (ref 13.0–17.0)
Hemoglobin: 18.7 g/dL — ABNORMAL HIGH (ref 13.0–17.0)
O2 Saturation: 62 %
O2 Saturation: 63 %
Potassium: 3.8 mmol/L (ref 3.5–5.1)
Potassium: 3.9 mmol/L (ref 3.5–5.1)
Sodium: 141 mmol/L (ref 135–145)
Sodium: 142 mmol/L (ref 135–145)
TCO2: 31 mmol/L (ref 22–32)
TCO2: 32 mmol/L (ref 22–32)
pCO2, Ven: 44.2 mmHg (ref 44.0–60.0)
pCO2, Ven: 45.7 mmHg (ref 44.0–60.0)
pH, Ven: 7.437 — ABNORMAL HIGH (ref 7.250–7.430)
pH, Ven: 7.438 — ABNORMAL HIGH (ref 7.250–7.430)
pO2, Ven: 31 mmHg — CL (ref 32.0–45.0)
pO2, Ven: 32 mmHg (ref 32.0–45.0)

## 2019-04-30 LAB — BASIC METABOLIC PANEL
Anion gap: 13 (ref 5–15)
BUN: 31 mg/dL — ABNORMAL HIGH (ref 8–23)
CO2: 24 mmol/L (ref 22–32)
Calcium: 9 mg/dL (ref 8.9–10.3)
Chloride: 101 mmol/L (ref 98–111)
Creatinine, Ser: 1.11 mg/dL (ref 0.61–1.24)
GFR calc Af Amer: >60 mL/min (ref >60)
GFR calc non Af Amer: >60 mL/min (ref >60)
Glucose, Bld: 135 mg/dL — ABNORMAL HIGH (ref 70–99)
Potassium: 4.1 mmol/L (ref 3.5–5.1)
Sodium: 138 mmol/L (ref 135–145)

## 2019-04-30 LAB — GLUCOSE, CAPILLARY
Glucose-Capillary: 104 mg/dL — ABNORMAL HIGH (ref 70–99)
Glucose-Capillary: 122 mg/dL — ABNORMAL HIGH (ref 70–99)

## 2019-04-30 LAB — CBC
HCT: 53.2 % — ABNORMAL HIGH (ref 39.0–52.0)
Hemoglobin: 17.5 g/dL — ABNORMAL HIGH (ref 13.0–17.0)
MCH: 28.7 pg (ref 26.0–34.0)
MCHC: 32.9 g/dL (ref 30.0–36.0)
MCV: 87.4 fL (ref 80.0–100.0)
Platelets: 300 10*3/uL (ref 150–400)
RBC: 6.09 MIL/uL — ABNORMAL HIGH (ref 4.22–5.81)
RDW: 15.4 % (ref 11.5–15.5)
WBC: 14.2 10*3/uL — ABNORMAL HIGH (ref 4.0–10.5)
nRBC: 0 % (ref 0.0–0.2)

## 2019-04-30 SURGERY — RIGHT/LEFT HEART CATH AND CORONARY ANGIOGRAPHY
Anesthesia: LOCAL

## 2019-04-30 MED ORDER — SODIUM CHLORIDE 0.9% FLUSH: 3.0000 mL | INTRAVENOUS | Status: DC | PRN

## 2019-04-30 MED ORDER — SODIUM CHLORIDE 0.9 % WEIGHT BASED INFUSION: 3.0000 mL/kg/h | INTRAVENOUS | Status: DC

## 2019-04-30 MED ORDER — DEXTROSE 50 % IV SOLN
INTRAVENOUS | Status: AC
  Filled 2019-04-30: qty 50

## 2019-04-30 MED ORDER — LABETALOL HCL 5 MG/ML IV SOLN: 10.0000 mg | INTRAVENOUS | Status: AC | PRN

## 2019-04-30 MED ORDER — VERAPAMIL HCL 2.5 MG/ML IV SOLN
INTRAVENOUS | Status: AC
  Filled 2019-04-30: qty 2

## 2019-04-30 MED ORDER — ONDANSETRON HCL 4 MG/2ML IJ SOLN: 4.0000 mg | Freq: Four times a day (QID) | INTRAMUSCULAR | Status: DC | PRN

## 2019-04-30 MED ORDER — MIDAZOLAM HCL 2 MG/2ML IJ SOLN
INTRAMUSCULAR | Status: DC | PRN
  Administered 2019-04-30: 2 mg via INTRAVENOUS

## 2019-04-30 MED ORDER — MIDAZOLAM HCL 2 MG/2ML IJ SOLN
INTRAMUSCULAR | Status: AC
  Filled 2019-04-30: qty 2

## 2019-04-30 MED ORDER — VERAPAMIL HCL 2.5 MG/ML IV SOLN
INTRAVENOUS | Status: DC | PRN
  Administered 2019-04-30 (×2): 10 mL via INTRA_ARTERIAL

## 2019-04-30 MED ORDER — SODIUM CHLORIDE 0.9% FLUSH
3.0000 mL | Freq: Two times a day (BID) | INTRAVENOUS | Status: DC
  Administered 2019-05-01: 3 mL via INTRAVENOUS

## 2019-04-30 MED ORDER — DIAZEPAM 5 MG PO TABS: 5.0000 mg | ORAL_TABLET | Freq: Four times a day (QID) | ORAL | Status: DC | PRN

## 2019-04-30 MED ORDER — ASPIRIN 81 MG PO CHEW: 81.0000 mg | CHEWABLE_TABLET | Freq: Every day | ORAL | Status: DC

## 2019-04-30 MED ORDER — SODIUM CHLORIDE 0.9 % IV SOLN: INTRAVENOUS | Status: DC

## 2019-04-30 MED ORDER — SODIUM CHLORIDE 0.9% FLUSH: 3.0000 mL | Freq: Two times a day (BID) | INTRAVENOUS | Status: DC

## 2019-04-30 MED ORDER — SODIUM CHLORIDE 0.9 % IV SOLN: 250.0000 mL | INTRAVENOUS | Status: DC | PRN

## 2019-04-30 MED ORDER — METOPROLOL SUCCINATE ER 25 MG PO TB24
25.0000 mg | ORAL_TABLET | Freq: Every day | ORAL | Status: DC
  Administered 2019-04-30 – 2019-05-01 (×2): 25 mg via ORAL
  Filled 2019-04-30 (×2): qty 1

## 2019-04-30 MED ORDER — FUROSEMIDE 80 MG PO TABS
80.0000 mg | ORAL_TABLET | Freq: Every day | ORAL | Status: DC
  Administered 2019-05-01: 80 mg via ORAL
  Filled 2019-04-30: qty 1

## 2019-04-30 MED ORDER — SODIUM CHLORIDE 0.9 % IV SOLN: INTRAVENOUS | Status: AC

## 2019-04-30 MED ORDER — HEPARIN SODIUM (PORCINE) 1000 UNIT/ML IJ SOLN
INTRAMUSCULAR | Status: DC | PRN
  Administered 2019-04-30: 5000 [IU] via INTRAVENOUS

## 2019-04-30 MED ORDER — FENTANYL CITRATE (PF) 100 MCG/2ML IJ SOLN
INTRAMUSCULAR | Status: AC
  Filled 2019-04-30: qty 2

## 2019-04-30 MED ORDER — ACETAMINOPHEN 325 MG PO TABS: 650.0000 mg | ORAL_TABLET | ORAL | Status: DC | PRN

## 2019-04-30 MED ORDER — FENTANYL CITRATE (PF) 100 MCG/2ML IJ SOLN
INTRAMUSCULAR | Status: DC | PRN
  Administered 2019-04-30: 25 ug via INTRAVENOUS

## 2019-04-30 MED ORDER — SODIUM CHLORIDE 0.9 % IV SOLN
INTRAVENOUS | Status: AC | PRN
  Administered 2019-04-30: 10 mL/h via INTRAVENOUS

## 2019-04-30 MED ORDER — HEPARIN (PORCINE) IN NACL 1000-0.9 UT/500ML-% IV SOLN
INTRAVENOUS | Status: DC | PRN
  Administered 2019-04-30 (×2): 500 mL

## 2019-04-30 MED ORDER — HEPARIN (PORCINE) IN NACL 1000-0.9 UT/500ML-% IV SOLN
INTRAVENOUS | Status: AC
  Filled 2019-04-30: qty 1000

## 2019-04-30 MED ORDER — SODIUM CHLORIDE 0.9 % WEIGHT BASED INFUSION: 1.0000 mL/kg/h | INTRAVENOUS | Status: DC

## 2019-04-30 MED ORDER — HYDRALAZINE HCL 20 MG/ML IJ SOLN: 10.0000 mg | INTRAMUSCULAR | Status: AC | PRN

## 2019-04-30 MED ORDER — HEPARIN SODIUM (PORCINE) 5000 UNIT/ML IJ SOLN
5000.0000 [IU] | Freq: Three times a day (TID) | INTRAMUSCULAR | Status: DC
  Administered 2019-05-01: 05:00:00 5000 [IU] via SUBCUTANEOUS
  Filled 2019-04-30 (×2): qty 1

## 2019-04-30 MED ORDER — ASPIRIN 81 MG PO CHEW
81.0000 mg | CHEWABLE_TABLET | ORAL | Status: AC
  Administered 2019-04-30: 09:00:00 81 mg via ORAL

## 2019-04-30 MED ORDER — IOHEXOL 350 MG/ML SOLN
INTRAVENOUS | Status: DC | PRN
  Administered 2019-04-30: 120 mL

## 2019-04-30 MED ORDER — ASPIRIN 81 MG PO CHEW
CHEWABLE_TABLET | ORAL | Status: AC
  Filled 2019-04-30: qty 1

## 2019-04-30 MED ORDER — LIDOCAINE HCL (PF) 1 % IJ SOLN
INTRAMUSCULAR | Status: AC
  Filled 2019-04-30: qty 30

## 2019-04-30 MED ORDER — LIDOCAINE HCL (PF) 1 % IJ SOLN
INTRAMUSCULAR | Status: DC | PRN
  Administered 2019-04-30: 5 mL

## 2019-04-30 MED ORDER — HEPARIN SODIUM (PORCINE) 1000 UNIT/ML IJ SOLN
INTRAMUSCULAR | Status: AC
  Filled 2019-04-30: qty 1

## 2019-04-30 SURGICAL SUPPLY — 18 items
CATH BALLN WEDGE 5F 110CM (CATHETERS) ×1 IMPLANT
CATH INFINITI 5 FR 3DRC (CATHETERS) ×1 IMPLANT
CATH INFINITI 5FR JK (CATHETERS) ×1 IMPLANT
CATH INFINITI JR4 5F (CATHETERS) ×1 IMPLANT
CATH LAUNCHER 5F ALR12 (CATHETERS) IMPLANT
CATH LAUNCHER 5F AR1 (CATHETERS) ×1 IMPLANT
CATH OPTITORQUE TIG 4.0 5F (CATHETERS) ×1 IMPLANT
CATHETER LAUNCHER 5F ALR12 (CATHETERS) ×2
DEVICE RAD COMP TR BAND LRG (VASCULAR PRODUCTS) ×1 IMPLANT
GLIDESHEATH SLEND SS 6F .021 (SHEATH) ×1 IMPLANT
GUIDEWIRE INQWIRE 1.5J.035X260 (WIRE) IMPLANT
INQWIRE 1.5J .035X260CM (WIRE) ×2
KIT HEART LEFT (KITS) ×2 IMPLANT
PACK CARDIAC CATHETERIZATION (CUSTOM PROCEDURE TRAY) ×2 IMPLANT
SHEATH GLIDE SLENDER 4/5FR (SHEATH) ×1 IMPLANT
SHEATH PROBE COVER 6X72 (BAG) ×1 IMPLANT
TRANSDUCER W/STOPCOCK (MISCELLANEOUS) ×2 IMPLANT
TUBING CIL FLEX 10 FLL-RA (TUBING) ×2 IMPLANT

## 2019-04-30 NOTE — Interval H&P Note (Signed)
Cath Lab Visit (complete for each Cath Lab visit)  Clinical Evaluation Leading to the Procedure:   ACS: No.  Non-ACS:    Anginal Classification: CCS II  Anti-ischemic medical therapy: Minimal Therapy (1 class of medications)  Non-Invasive Test Results: No non-invasive testing performed  Prior CABG: No previous CABG      History and Physical Interval Note:  04/30/2019 9:32 AM  Blake West  has presented today for surgery, with the diagnosis of unstable angina.  The various methods of treatment have been discussed with the patient and family. After consideration of risks, benefits and other options for treatment, the patient has consented to  Procedure(s): RIGHT/LEFT HEART CATH AND CORONARY ANGIOGRAPHY (N/A) as a surgical intervention.  The patient's history has been reviewed, patient examined, no change in status, stable for surgery.  I have reviewed the patient's chart and labs.  Questions were answered to the patient's satisfaction.     Shelva Majestic

## 2019-04-30 NOTE — Plan of Care (Signed)
  Problem: Education: Goal: Knowledge of General Education information will improve Description: Including pain rating scale, medication(s)/side effects and non-pharmacologic comfort measures Outcome: Progressing   Problem: Health Behavior/Discharge Planning: Goal: Ability to manage health-related needs will improve Outcome: Progressing   Problem: Clinical Measurements: Goal: Ability to maintain clinical measurements within normal limits will improve Outcome: Progressing   Problem: Clinical Measurements: Goal: Cardiovascular complication will be avoided Outcome: Progressing   Problem: Safety: Goal: Ability to remain free from injury will improve Outcome: Progressing

## 2019-04-30 NOTE — Progress Notes (Signed)
TR band is removed at this time. Insertion site is level 0. 

## 2019-04-30 NOTE — Progress Notes (Signed)
Progress Note  Patient Name: Blake West Date of Encounter: 04/30/2019  Primary Cardiologist: Dorris Carnes, MD  Subjective   Patient and wife would like to pursue coronary angiography today. Consent discussed and obtained.   Breathing better today. Mildly elevated temperature yesterday with chills, coughing. Resolved today.   Inpatient Medications    Scheduled Meds: . aspirin EC  81 mg Oral Daily  . docusate sodium  100 mg Oral BID  . furosemide  80 mg Intravenous Q12H  . insulin aspart  0-5 Units Subcutaneous QHS  . insulin aspart  0-9 Units Subcutaneous TID WC  . potassium chloride  40 mEq Oral BID  . sodium chloride flush  3 mL Intravenous Q12H   Continuous Infusions: . sodium chloride     PRN Meds: sodium chloride, acetaminophen **OR** acetaminophen, bisacodyl, HYDROcodone-acetaminophen, morphine injection, ondansetron **OR** ondansetron (ZOFRAN) IV, polyethylene glycol, sodium chloride flush, zolpidem   Vital Signs    Vitals:   04/29/19 1720 04/29/19 2300 04/30/19 0420 04/30/19 0547  BP: 101/72 121/79 107/82 120/89  Pulse: (!) 104 (!) 104 100 94  Resp: 20 17 18 18   Temp: 100 F (37.8 C) 98 F (36.7 C)  98.3 F (36.8 C)  TempSrc: Oral Oral  Oral  SpO2:  92% 93% 93%  Weight:    101.8 kg  Height:        Intake/Output Summary (Last 24 hours) at 04/30/2019 0839 Last data filed at 04/30/2019 0300 Gross per 24 hour  Intake 120 ml  Output 1450 ml  Net -1330 ml   Last 3 Weights 04/30/2019 04/28/2019 04/27/2019  Weight (lbs) 224 lb 6.4 oz 230 lb 240 lb 4.8 oz  Weight (kg) 101.787 kg 104.327 kg 108.999 kg     Telemetry    NSR with frequent PVCs, 13 beat run of NSVT, SVT as well- Personally Reviewed  Physical Exam   GEN: No acute distress.   Neck: No JVD at 90 deg Cardiac: RRR, no murmurs, rubs, or gallops.  Respiratory: Clear to auscultation bilaterally. GI: Soft, nontender, non-distended  MS: Trace edema; No deformity. Neuro:  Nonfocal  Psych: Normal affect     Labs    High Sensitivity Troponin:   Recent Labs  Lab 04/25/19 1404 04/25/19 1551  TROPONINIHS 23* 21*      Cardiac EnzymesNo results for input(s): TROPONINI in the last 168 hours. No results for input(s): TROPIPOC in the last 168 hours.   Chemistry Recent Labs  Lab 04/28/19 0242 04/29/19 0147 04/30/19 0427  NA 142 140 138  K 3.4* 3.8 4.1  CL 99 99 101  CO2 28 27 24   GLUCOSE 103* 118* 135*  BUN 28* 31* 31*  CREATININE 1.10 1.18 1.11  CALCIUM 8.8* 8.8* 9.0  GFRNONAA >60 >60 >60  GFRAA >60 >60 >60  ANIONGAP 15 14 13      Hematology Recent Labs  Lab 04/28/19 0242 04/29/19 0147 04/30/19 0427  WBC 10.2 13.9* 14.2*  RBC 5.64 5.70 6.09*  HGB 16.3 16.3 17.5*  HCT 50.5 50.9 53.2*  MCV 89.5 89.3 87.4  MCH 28.9 28.6 28.7  MCHC 32.3 32.0 32.9  RDW 15.2 15.5 15.4  PLT 283 303 300    BNP Recent Labs  Lab 04/25/19 1416  BNP 2,068.6*     DDimer No results for input(s): DDIMER in the last 168 hours.   Radiology    No results found.  Cardiac Studies   2D echo 04/26/19 1. Severe global reduction in LV systolic function; mild LVH;  mildly  dilated aortic root; mild MR; mild LAE; moderate RV dysfunction.  2. Left ventricular ejection fraction, by estimation, is 20 to 25%. The  left ventricle has severely decreased function. The left ventricle  demonstrates global hypokinesis. There is mild left ventricular  hypertrophy. Left ventricular diastolic function  could not be evaluated.  3. Right ventricular systolic function is moderately reduced. The right  ventricular size is normal.  4. Left atrial size was mildly dilated.  5. The mitral valve is normal in structure. Mild mitral valve  regurgitation. No evidence of mitral stenosis.  6. The aortic valve is tricuspid. Aortic valve regurgitation is not  visualized. Mild aortic valve sclerosis is present, with no evidence of  aortic valve stenosis.  7. Aortic dilatation noted. There is mild dilatation of the  aortic root  measuring 40 mm.  8. The inferior vena cava is dilated in size with <50% respiratory  variability, suggesting right atrial pressure of 15 mmHg  Patient Profile     69 y.o. male with history of hypothyroidism, OSA on CPAP, obesity, HTN and palpitations (PACs/PVCs by prior monitor) who was admitted with worsening SOB, edema, nonproductive cough, wheezing and orthopnea. Found to have uncontrolled HTN and new onset CHF. 2D echo shows severe LV dysfunction with EF 20-25%, moderately reduced RV function, mild LVH, mild LAE, mild MR, mild dilation of aortic root. Covid test negative. Per admitting H/P "He is uncertain if he will be willing to take medications at home. His wife is Native Optometrist and a Theatre stage manager with a degree in herbatology and they prefer a holistic medicine approach."  Assessment & Plan    1. Acute systolic biventricular heart failure with newly diagnosed cardiomyopathy and hypoxia -  - long discussion with Dayna Dunn yesterday on medical therapy for HF. -declines losartan. - will consider BB, consider starting today since he did not develop further infectious symptoms - consider irbesartan addition in 1-2 days if he tolerates BB.  - for cath [facility] - lasix 80 mg IV BID, I/O: UOP 2.6L yesterday, net negative 20 L. INFORMED CONSENT: I have reviewed the risks, indications, and alternatives to cardiac catheterization, possible angioplasty, and stenting with the patient. Risks include but are not limited to bleeding, infection, vascular injury, stroke, myocardial infection, arrhythmia, kidney injury, radiation-related injury in the case of prolonged fluoroscopy use, emergency cardiac surgery, and death. The patient understands the risks of serious complication is 1-2 in 4098 with diagnostic cardiac cath and 1-2% or less with angioplasty/stenting.   2. Newly recognized diabetes mellitus - A1C 6.7, further management per IM.  3. NSVT, PACs, PVCs - K 4.1 today, will  start BB  4. AKI - Cr 1.11, for cath today.   5. OSA - pt has declined formal CPAP, is being offered nasal pillow which IM is helping to arrange.  6. Hyperlipidemia - patient refuses statin therapy at this time, will discontinue.  7. Hypothyroidism - TSH 11.1. Further per IM.  For questions or updates, please contact [place] Please consult [email or web address] for contact info under Cardiology/STEMI.  Signed, Elouise Munroe, MD 04/30/2019, 8:39 AM

## 2019-04-30 NOTE — Progress Notes (Signed)
Cath report reviewed by Dr. Margaretann Loveless. Got IV Lasix this AM. Will hold further dosing and start oral Lasix in AM. Melina Copa PA-C

## 2019-04-30 NOTE — Progress Notes (Signed)
Call from tele with report that pt HR 141, pt sitting up on side of bed, awake, alert and oriented, denies c/o chest pain or discomfort. Vs stable, will cont to monitor.

## 2019-04-30 NOTE — Progress Notes (Signed)
PROGRESS NOTE    Blake West  ERD:[id number] DOB: 1950-03-28 DOA: 04/25/2019 PCP: Patient, No Pcp Per   Brief Narrative:  FIORE DETJEN is a 69 y.o. male with medical history significant of hypothyroidism; OSA on CPAP; and HTN presenting with SOB.  He reports LE edema.  He thought he had pneumonia.  He was SOB, coughing mostly nonproductive.  No chest pain. +PND.  He uses a CPAP at night.  SOB is worse with exertion.  He was admitted with new onset CHF.  Echo shows 25% ejection fraction.  Cardiology recommended cardiac cath.  Patient was against that and wanted to try medications and noninvasive interventions first.  CT coronary angiogram was ordered but this could not be done since patient's heart rate remained elevated.  Patient remains on Lasix and other medications.  He was intermittently refusing aspirin and finally agreed to take that as well.  He then changed his mind and today talked to cardiology that he would want to have cardiac cath which is planned today  Assessment & Plan:   Principal Problem:   New onset of congestive heart failure ([place]) Active Problems:   Essential hypertension   OSA on CPAP   Class 2 severe obesity due to excess calories with serious comorbidity and body mass index (BMI) of 36.0 to 36.9 in adult [facility])   Acute systolic (congestive) heart failure (HCC)   Type 2 diabetes mellitus ([place])   Hyperlipidemia  New onset acute systolic congestive heart failure/paroxysmal NSVT, PAC, PVCs: Echo shows 25% ejection fraction. Initial plan was left and right heart cath on Monday however the wife did not want to do any invasive procedure and requested noninvasive measures.They have traditionally shied away from [place] in lieu of conservative, holistic approach. Wife reports they had family members with complications from more invasive approaches and are therefore wary. Cardiology on board.  Plan for coronary CTA however this could not be achieved due to elevated heart  rate despite a beta-blocker.  Continues to have good urine output with net negative of approx 21 L since admission or perhaps more.  Feels better.  No crackles on my exam.  Cardiology on board.  Patient has been refusing statin, aspirin and losartan.  He discussed with cardiology.  Now willing to take aspirin but no statin or losartan as of yet.  He change his mind now and wanted to have cardiac cath.  Dr. Margaretann Loveless from cardiology had a very long discussion about risk and benefits of cardiac cath with him while I was seeing patient as well.  He is planned to have the catheter today.  Hyperlipidemia: Continue atorvastatin (patient refusing).  Essential hypertension: Blood pressure much better controlled.  Continue current medications.  New onset type 2 diabetes mellitus: Hemoglobin A1c 6.7.  Continue SSI however he is refusing Accu-Cheks.  Obesity: Weight loss encouraged.  OSA: Refused CPAP.  Wants to wear nocturnal oxygen.  Psoriasis/bilateral lower extremity erythema with scabs and scratch marks: Per wife, he has psoriasis and he scratches himself often.  Although it looks erythematous but it is not warm and nontender.  This is improving now.  He wants to continue traditional topical treatment for this.    DVT prophylaxis: Lovenox   Code Status: DNR  Family Communication: Wife and daughter at the bedside.  Patient is from: Home Disposition Plan: Home likely next 1 to 2 days Barriers to discharge: Pending further work-up/cardiac cath by cardiology.   Estimated body mass index is 31.3 kg/m as  calculated from the following:   Height as of this encounter: 5\' 11"  (1.803 m).   Weight as of this encounter: 101.8 kg.      Nutritional status:               Consultants:   Cardiology  Procedures:   None  Antimicrobials:   None   Subjective: Seen and examined.  Feels better.  Minimal exertional shortness of breath.  Has some anxiety regarding the  procedure.  Objective: Vitals:   04/29/19 1720 04/29/19 2300 04/30/19 0420 04/30/19 0547  BP: 101/72 121/79 107/82 120/89  Pulse: (!) 104 (!) 104 100 94  Resp: 20 17 18 18   Temp: 100 F (37.8 C) 98 F (36.7 C)  98.3 F (36.8 C)  TempSrc: Oral Oral  Oral  SpO2:  92% 93% 93%  Weight:    101.8 kg  Height:        Intake/Output Summary (Last 24 hours) at 04/30/2019 0855 Last data filed at 04/30/2019 0300 Gross per 24 hour  Intake 120 ml  Output 1450 ml  Net -1330 ml   Filed Weights   04/27/19 0512 04/28/19 0836 04/30/19 0547  Weight: 109 kg 104.3 kg 101.8 kg    Examination:  General exam: Appears calm and comfortable  Respiratory system: Clear to auscultation. Respiratory effort normal. Cardiovascular system: S1 & S2 heard, RRR. No JVD, murmurs, rubs, gallops or clicks.  +1 pitting edema bilateral lower extremity Gastrointestinal system: Abdomen is nondistended, soft and nontender. No organomegaly or masses felt. Normal bowel sounds heard. Central nervous system: Alert and oriented. No focal neurological deficits. Extremities: Symmetric 5 x 5 power. Skin: No rashes, lesions or ulcers.  Has mild erythema with scabs and scratch marks at bilateral lower extremities.  Improving. Psychiatry: Judgement and insight appear normal. Mood & affect appropriate.    Data Reviewed: I have personally reviewed following labs and imaging studies  CBC: Recent Labs  Lab 04/25/19 2359 04/27/19 0231 04/28/19 0242 04/29/19 0147 04/30/19 0427  WBC 12.2* 12.2* 10.2 13.9* 14.2*  NEUTROABS 7.2  --   --   --   --   HGB 15.3 15.4 16.3 16.3 17.5*  HCT 47.2 48.6 50.5 50.9 53.2*  MCV 89.2 90.0 89.5 89.3 87.4  PLT 284 287 283 303 779   Basic Metabolic Panel: Recent Labs  Lab 04/25/19 2359 04/27/19 0231 04/28/19 0242 04/29/19 0147 04/30/19 0427  NA 140 143 142 140 138  K 4.1 4.6 3.4* 3.8 4.1  CL 103 100 99 99 101  CO2 26 32 28 27 24   GLUCOSE 107* 115* 103* 118* 135*  BUN 26* 29* 28* 31*  31*  CREATININE 1.17 1.36* 1.10 1.18 1.11  CALCIUM 8.3* 9.2 8.8* 8.8* 9.0  MG  --   --  2.3  --   --    GFR: Estimated Creatinine Clearance: 76.3 mL/min (by C-G formula based on SCr of 1.11 mg/dL). Liver Function Tests: No results for input(s): AST, ALT, ALKPHOS, BILITOT, PROT, ALBUMIN in the last 168 hours. No results for input(s): LIPASE, AMYLASE in the last 168 hours. No results for input(s): AMMONIA in the last 168 hours. Coagulation Profile: No results for input(s): INR, PROTIME in the last 168 hours. Cardiac Enzymes: No results for input(s): CKTOTAL, CKMB, CKMBINDEX, TROPONINI in the last 168 hours. BNP (last 3 results) No results for input(s): PROBNP in the last 8760 hours. HbA1C: No results for input(s): HGBA1C in the last 72 hours. CBG: Recent Labs  Lab 04/26/19 1642  GLUCAP 103*   Lipid Profile: No results for input(s): CHOL, HDL, LDLCALC, TRIG, CHOLHDL, LDLDIRECT in the last 72 hours. Thyroid Function Tests: No results for input(s): TSH, T4TOTAL, FREET4, T3FREE, THYROIDAB in the last 72 hours. Anemia Panel: No results for input(s): VITAMINB12, FOLATE, FERRITIN, TIBC, IRON, RETICCTPCT in the last 72 hours. Sepsis Labs: No results for input(s): PROCALCITON, LATICACIDVEN in the last 168 hours.  Recent Results (from the past 240 hour(s))  SARS CORONAVIRUS 2 (TAT 6-24 HRS) Nasopharyngeal Nasopharyngeal Swab     Status: None   Collection Time: 04/25/19  3:51 PM   Specimen: Nasopharyngeal Swab  Result Value Ref Range Status   SARS Coronavirus 2 NEGATIVE NEGATIVE Final    Comment: (NOTE) SARS-CoV-2 target nucleic acids are NOT DETECTED. The SARS-CoV-2 RNA is generally detectable in upper and lower respiratory specimens during the acute phase of infection. Negative results do not preclude SARS-CoV-2 infection, do not rule out co-infections with other pathogens, and should not be used as the sole basis for treatment or other patient management decisions. Negative  results must be combined with clinical observations, patient history, and epidemiological information. The expected result is Negative. Fact Sheet for Patients: SugarRoll.be Fact Sheet for Healthcare Providers: https://www.woods-mathews.com/ This test is not yet approved or cleared by the [place] FDA and  has been authorized for detection and/or diagnosis of SARS-CoV-2 by FDA under an Emergency Use Authorization (EUA). This EUA will remain  in effect (meaning this test can be used) for the duration of the COVID-19 declaration under Section 56 4(b)(1) of the Act, 21 U.S.C. section 360bbb-3(b)(1), unless the authorization is terminated or revoked sooner. Performed at [facility], [place] [address] [place], [place] [address]       Radiology Studies: No results found.  Scheduled Meds: . aspirin EC  81 mg Oral Daily  . docusate sodium  100 mg Oral BID  . furosemide  80 mg Intravenous Q12H  . insulin aspart  0-5 Units Subcutaneous QHS  . insulin aspart  0-9 Units Subcutaneous TID WC  . metoprolol succinate  25 mg Oral Daily  . potassium chloride  40 mEq Oral BID  . sodium chloride flush  3 mL Intravenous Q12H  . sodium chloride flush  3 mL Intravenous Q12H   Continuous Infusions: . sodium chloride    . sodium chloride    . sodium chloride       LOS: 5 days   Time spent: 32 minutes   Darliss Cheney, MD [facility]  04/30/2019, 8:55 AM   To contact the attending provider between 7A-7P or the covering provider during after hours 7P-7A, please log into the web site [email or web address].

## 2019-04-30 NOTE — H&P (View-Only) (Signed)
Progress Note  Patient Name: Blake West Date of Encounter: 04/30/2019  Primary Cardiologist: Dorris Carnes, MD  Subjective   Patient and wife would like to pursue coronary angiography today. Consent discussed and obtained.   Breathing better today. Mildly elevated temperature yesterday with chills, coughing. Resolved today.   Inpatient Medications    Scheduled Meds: . aspirin EC  81 mg Oral Daily  . docusate sodium  100 mg Oral BID  . furosemide  80 mg Intravenous Q12H  . insulin aspart  0-5 Units Subcutaneous QHS  . insulin aspart  0-9 Units Subcutaneous TID WC  . potassium chloride  40 mEq Oral BID  . sodium chloride flush  3 mL Intravenous Q12H   Continuous Infusions: . sodium chloride     PRN Meds: sodium chloride, acetaminophen **OR** acetaminophen, bisacodyl, HYDROcodone-acetaminophen, morphine injection, ondansetron **OR** ondansetron (ZOFRAN) IV, polyethylene glycol, sodium chloride flush, zolpidem   Vital Signs    Vitals:   04/29/19 1720 04/29/19 2300 04/30/19 0420 04/30/19 0547  BP: 101/72 121/79 107/82 120/89  Pulse: (!) 104 (!) 104 100 94  Resp: 20 17 18 18   Temp: 100 F (37.8 C) 98 F (36.7 C)  98.3 F (36.8 C)  TempSrc: Oral Oral  Oral  SpO2:  92% 93% 93%  Weight:    101.8 kg  Height:        Intake/Output Summary (Last 24 hours) at 04/30/2019 0839 Last data filed at 04/30/2019 0300 Gross per 24 hour  Intake 120 ml  Output 1450 ml  Net -1330 ml   Last 3 Weights 04/30/2019 04/28/2019 04/27/2019  Weight (lbs) 224 lb 6.4 oz 230 lb 240 lb 4.8 oz  Weight (kg) 101.787 kg 104.327 kg 108.999 kg     Telemetry    NSR with frequent PVCs, 13 beat run of NSVT, SVT as well- Personally Reviewed  Physical Exam   GEN: No acute distress.   Neck: No JVD at 90 deg Cardiac: RRR, no murmurs, rubs, or gallops.  Respiratory: Clear to auscultation bilaterally. GI: Soft, nontender, non-distended  MS: Trace edema; No deformity. Neuro:  Nonfocal  Psych: Normal affect     Labs    High Sensitivity Troponin:   Recent Labs  Lab 04/25/19 1404 04/25/19 1551  TROPONINIHS 23* 21*      Cardiac EnzymesNo results for input(s): TROPONINI in the last 168 hours. No results for input(s): TROPIPOC in the last 168 hours.   Chemistry Recent Labs  Lab 04/28/19 0242 04/29/19 0147 04/30/19 0427  NA 142 140 138  K 3.4* 3.8 4.1  CL 99 99 101  CO2 28 27 24   GLUCOSE 103* 118* 135*  BUN 28* 31* 31*  CREATININE 1.10 1.18 1.11  CALCIUM 8.8* 8.8* 9.0  GFRNONAA >60 >60 >60  GFRAA >60 >60 >60  ANIONGAP 15 14 13      Hematology Recent Labs  Lab 04/28/19 0242 04/29/19 0147 04/30/19 0427  WBC 10.2 13.9* 14.2*  RBC 5.64 5.70 6.09*  HGB 16.3 16.3 17.5*  HCT 50.5 50.9 53.2*  MCV 89.5 89.3 87.4  MCH 28.9 28.6 28.7  MCHC 32.3 32.0 32.9  RDW 15.2 15.5 15.4  PLT 283 303 300    BNP Recent Labs  Lab 04/25/19 1416  BNP 2,068.6*     DDimer No results for input(s): DDIMER in the last 168 hours.   Radiology    No results found.  Cardiac Studies   2D echo 04/26/19 1. Severe global reduction in LV systolic function; mild LVH;  mildly  dilated aortic root; mild MR; mild LAE; moderate RV dysfunction.  2. Left ventricular ejection fraction, by estimation, is 20 to 25%. The  left ventricle has severely decreased function. The left ventricle  demonstrates global hypokinesis. There is mild left ventricular  hypertrophy. Left ventricular diastolic function  could not be evaluated.  3. Right ventricular systolic function is moderately reduced. The right  ventricular size is normal.  4. Left atrial size was mildly dilated.  5. The mitral valve is normal in structure. Mild mitral valve  regurgitation. No evidence of mitral stenosis.  6. The aortic valve is tricuspid. Aortic valve regurgitation is not  visualized. Mild aortic valve sclerosis is present, with no evidence of  aortic valve stenosis.  7. Aortic dilatation noted. There is mild dilatation of the  aortic root  measuring 40 mm.  8. The inferior vena cava is dilated in size with <50% respiratory  variability, suggesting right atrial pressure of 15 mmHg  Patient Profile     69 y.o. male with history of hypothyroidism, OSA on CPAP, obesity, HTN and palpitations (PACs/PVCs by prior monitor) who was admitted with worsening SOB, edema, nonproductive cough, wheezing and orthopnea. Found to have uncontrolled HTN and new onset CHF. 2D echo shows severe LV dysfunction with EF 20-25%, moderately reduced RV function, mild LVH, mild LAE, mild MR, mild dilation of aortic root. Covid test negative. Per admitting H/P "He is uncertain if he will be willing to take medications at home. His wife is Native Optometrist and a Theatre stage manager with a degree in herbatology and they prefer a holistic medicine approach."  Assessment & Plan    1. Acute systolic biventricular heart failure with newly diagnosed cardiomyopathy and hypoxia -  - long discussion with Dayna Dunn yesterday on medical therapy for HF. -declines losartan. - will consider BB, consider starting today since he did not develop further infectious symptoms - consider irbesartan addition in 1-2 days if he tolerates BB.  - for cath today [facility] - lasix 80 mg IV BID, I/O: UOP 2.6L yesterday, net negative 20 L. INFORMED CONSENT: I have reviewed the risks, indications, and alternatives to cardiac catheterization, possible angioplasty, and stenting with the patient. Risks include but are not limited to bleeding, infection, vascular injury, stroke, myocardial infection, arrhythmia, kidney injury, radiation-related injury in the case of prolonged fluoroscopy use, emergency cardiac surgery, and death. The patient understands the risks of serious complication is 1-2 in 5364 with diagnostic cardiac cath and 1-2% or less with angioplasty/stenting.   2. Newly recognized diabetes mellitus - A1C 6.7, further management per West.  3. NSVT, PACs, PVCs - K 4.1 today, will  start BB  4. AKI - Cr 1.11, for cath today.   5. OSA - pt has declined formal CPAP, is being offered nasal pillow which West is helping to arrange.  6. Hyperlipidemia - patient refuses statin therapy at this time, will discontinue.  7. Hypothyroidism - TSH 11.1. Further per West.  For questions or updates, please contact [place] Please consult [email or web address] for contact info under Cardiology/STEMI.  Signed, Elouise Munroe, MD 04/30/2019, 8:39 AM

## 2019-04-30 NOTE — Progress Notes (Signed)
3 cc removed from TR band; R hand neurovascular status intact; total of 8 cc left in band. To floor RN, tele. Pt remained in stable condition

## 2019-05-01 ENCOUNTER — Telehealth: Payer: Self-pay | Admitting: Physician Assistant

## 2019-05-01 DIAGNOSIS — E1159 Type 2 diabetes mellitus with other circulatory complications: Secondary | ICD-10-CM

## 2019-05-01 DIAGNOSIS — I5041 Acute combined systolic (congestive) and diastolic (congestive) heart failure: Secondary | ICD-10-CM

## 2019-05-01 DIAGNOSIS — Z6836 Body mass index (BMI) 36.0-36.9, adult: Secondary | ICD-10-CM

## 2019-05-01 DIAGNOSIS — E7849 Other hyperlipidemia: Secondary | ICD-10-CM

## 2019-05-01 DIAGNOSIS — I5021 Acute systolic (congestive) heart failure: Secondary | ICD-10-CM

## 2019-05-01 LAB — BASIC METABOLIC PANEL
Anion gap: 12 (ref 5–15)
BUN: 32 mg/dL — ABNORMAL HIGH (ref 8–23)
CO2: 25 mmol/L (ref 22–32)
Calcium: 8.6 mg/dL — ABNORMAL LOW (ref 8.9–10.3)
Chloride: 100 mmol/L (ref 98–111)
Creatinine, Ser: 1.16 mg/dL (ref 0.61–1.24)
GFR calc Af Amer: >60 mL/min (ref >60)
GFR calc non Af Amer: >60 mL/min (ref >60)
Glucose, Bld: 116 mg/dL — ABNORMAL HIGH (ref 70–99)
Potassium: 5.2 mmol/L — ABNORMAL HIGH (ref 3.5–5.1)
Sodium: 137 mmol/L (ref 135–145)

## 2019-05-01 LAB — CBC
HCT: 56.5 % — ABNORMAL HIGH (ref 39.0–52.0)
Hemoglobin: 17.8 g/dL — ABNORMAL HIGH (ref 13.0–17.0)
MCH: 28.6 pg (ref 26.0–34.0)
MCHC: 31.5 g/dL (ref 30.0–36.0)
MCV: 90.7 fL (ref 80.0–100.0)
Platelets: 310 10*3/uL (ref 150–400)
RBC: 6.23 MIL/uL — ABNORMAL HIGH (ref 4.22–5.81)
RDW: 15.6 % — ABNORMAL HIGH (ref 11.5–15.5)
WBC: 11.7 10*3/uL — ABNORMAL HIGH (ref 4.0–10.5)
nRBC: 0 % (ref 0.0–0.2)

## 2019-05-01 LAB — POTASSIUM: Potassium: 4.6 mmol/L (ref 3.5–5.1)

## 2019-05-01 LAB — GLUCOSE, CAPILLARY
Glucose-Capillary: 133 mg/dL — ABNORMAL HIGH (ref 70–99)
Glucose-Capillary: 118 mg/dL — ABNORMAL HIGH (ref 70–99)
Glucose-Capillary: 99 mg/dL (ref 70–99)

## 2019-05-01 LAB — MAGNESIUM: Magnesium: 2.8 mg/dL — ABNORMAL HIGH (ref 1.7–2.4)

## 2019-05-01 MED ORDER — ASPIRIN 81 MG PO TBEC: 81.0000 mg | DELAYED_RELEASE_TABLET | Freq: Every day | ORAL | 0 refills | Status: AC

## 2019-05-01 MED ORDER — METOPROLOL SUCCINATE ER 25 MG PO TB24: 25.0000 mg | ORAL_TABLET | Freq: Every day | ORAL | 0 refills | Status: DC

## 2019-05-01 MED ORDER — FUROSEMIDE 80 MG PO TABS: 80.0000 mg | ORAL_TABLET | Freq: Every day | ORAL | 0 refills | Status: DC

## 2019-05-01 NOTE — Discharge Summary (Signed)
Physician Discharge Summary  Blake West OZD:[id number] DOB: Jul 11, 1950 DOA: 04/25/2019  PCP: Patient, No Pcp Per  Admit date: 04/25/2019 Discharge date: 05/01/2019  Admitted From: Home Disposition: Home  Recommendations for Outpatient Follow-up:  1. Follow up with PCP in 1-2 weeks, needs to establish care with PCP prefers [place] 2. Follow-up with cardiology as scheduled on 05/09/2019 3. Please obtain BMP in one week 4. Discharge home on metoprolol succinate 25 mg p.o. daily, Lasix 80 mg p.o. daily, aspirin 81 mg p.o. daily 5. Needs further discussion regarding starting ARB/ACE inhibitor and statin 6. Needs further follow-up regarding type 2 diabetes mellitus, A1c 6.7; patient wishes for diet control at this time  Home Health: No Equipment/Devices: None  Discharge Condition: Stable CODE STATUS: Full code Diet recommendation: Heart healthy, consistent carbohydrate, low-salt  History of present illness:  Blake West is a 69 y.o. male with medical history significant of hypothyroidism; OSA on CPAP; and HTN presenting with SOB.  He reports LE edema.  He thought he had pneumonia.  He was SOB, coughing mostly nonproductive.  No chest pain.  He has had wheezing.  No fevers.  +orthopnea - "I haven't been able to do that since I was a kid."  +PND.  He uses a CPAP at night.  SOB is worse with exertion.   He is hoping that we are able to improve his symptoms overnight and discharge him to home tomorrow.  He is uncertain if he will be willing to take medications at home.  His wife is Native Optometrist and a Theatre stage manager with a degree in herbatology and they prefer a holistic medicine approach.   ED Course: New-onset CHF with notable significant edema, markedly elevated BNP.  +cough, SOB.  87% with ambulation.  Cardiology consulted.  Referred for admission for further evaluation and treatment of likely new onset CHF.  Hospital course:  New onset acute systolic congestive heart  failure Nonischemic cardiomyopathy Patient presenting with progressive shortness of breath with lower extremity edema.  Was noted to have an elevated BNP.  TTE notable for LVEF of 25%.  Cardiology was consulted and followed during the hospital course.  Patient was started on IV diuresis with good urine output with a net negative fluid loss of 21 L during the hospitalization.  Patient was able to be titrated off of supplemental oxygen.  Underwent left heart catheterization on 04/30/2019 notable for proximal RCA lesion of 90%, first marginal lesion of 20%, proximal LAD lesion with 30%.  Will discharge home on furosemide 80 mg p.o. daily, metoprolol succinate 25 mg p.o. daily, aspirin 81 mg p.o. daily.  Refuses statin therapy.  Consider ACE inhibitor/ARB therapy at the CV follow-up visit.  Will need repeat BMP 1 week.  HLD: Refused statin therapy.  Continue to educate outpatient  Essential hypertension: BP 121/86, fairly well controlled.  Started on metoprolol succinate 25 mg p.o. daily.  Type 2 diabetes mellitus: Hemoglobin A1c 6.7.  Does not wish for medication at this time, plans diet controlled.  Recommend establish care with PCP for further surveillance.  OSA: Continue home CPAP  Obesity BMI 31.38, discussed need for aggressive lifestyle changes/weight loss as this complicates all facets of care.  Psoriasis: Patient is a continue home topical treatment  Discharge Diagnoses:  Principal Problem:   Acute congestive heart failure (HCC) Active Problems:   Essential hypertension   OSA on CPAP   Class 2 severe obesity due to excess calories with serious comorbidity and body mass index (BMI) of  36.0 to 36.9 in adult [facility])   Acute systolic (congestive) heart failure (HCC)   Type 2 diabetes mellitus ([place])   Hyperlipidemia    Discharge Instructions  Discharge Instructions    (HEART FAILURE PATIENTS) Call MD:  Anytime you have any of the following symptoms: 1) 3 pound weight gain in 24 hours or  5 pounds in 1 week 2) shortness of breath, with or without a dry hacking cough 3) swelling in the hands, feet or stomach 4) if you have to sleep on extra pillows at night in order to breathe.   Complete by: As directed    Call MD for:  difficulty breathing, headache or visual disturbances   Complete by: As directed    Call MD for:  extreme fatigue   Complete by: As directed    Call MD for:  persistant dizziness or light-headedness   Complete by: As directed    Call MD for:  persistant nausea and vomiting   Complete by: As directed    Call MD for:  severe uncontrolled pain   Complete by: As directed    Call MD for:  temperature >100.4   Complete by: As directed    Diet - low sodium heart healthy   Complete by: As directed    Increase activity slowly   Complete by: As directed      Allergies as of 05/01/2019      Reactions   Codeine    Joints hurt       Medication List    TAKE these medications   aspirin 81 MG EC tablet Take 1 tablet (81 mg total) by mouth daily. Start taking on: May 02, 2019   cholecalciferol 10 MCG (400 UNIT) Tabs tablet Commonly known as: VITAMIN D3 Take 400 Units by mouth 3 (three) times daily.   furosemide 80 MG tablet Commonly known as: LASIX Take 1 tablet (80 mg total) by mouth daily. Start taking on: May 02, 2019   metoprolol succinate 25 MG 24 hr tablet Commonly known as: TOPROL-XL Take 1 tablet (25 mg total) by mouth daily. Start taking on: May 02, 2019   NON FORMULARY Take 1 capsule by mouth with breakfast, with lunch, and with evening meal. Alphalplex   OVER THE COUNTER MEDICATION Thyroid otc medicine Simplex m   Thera Tabs Take 1 tablet by mouth daily.   vitamin C 100 MG tablet Take 500 mg by mouth daily.   Vitamin E 400 units Tabs Take 400 Units by mouth 2 (two) times daily.            Durable Medical Equipment  (From admission, onward)         Start     Ordered   04/28/19 1405  For home use only DME Other see  comment  Once    Comments: Nasal pillow for CPAP machine--pt has CPAP already through [employer]  Question:  Length of Need  Answer:  Lifetime   04/28/19 1405         Follow-up Information    Rise Mu, PA-C Follow up.   Specialties: Physician Assistant, Cardiology, Radiology Why: [place] location - Thursday May 09, 2019 at 10:00 AM (Arrive by 9:45 AM). Thurmond Butts is one of the PAs that works closely with our cardiology team. Contact information: [place] [address] [phone]          Allergies  Allergen Reactions  . Codeine     Joints hurt  Consultations:  Cardiology   Procedures/Studies: DG Chest 2 View  Result Date: 04/25/2019 CLINICAL DATA:  Shortness of breath EXAM: CHEST - 2 VIEW COMPARISON:  04/17/2019 FINDINGS: Mild cardiomegaly. Similar appearance of diffuse bilateral interstitial pulmonary opacity, likely with trace bilateral pleural effusions. No new or focal airspace opacity. IMPRESSION: Similar appearance of diffuse bilateral interstitial pulmonary opacity, likely with trace bilateral pleural effusions. No new or focal airspace opacity. Findings are consistent with atypical/viral infection or edema. Electronically Signed   By: Eddie Candle M.D.   On: 04/25/2019 14:53   DG Chest 2 View  Result Date: 04/17/2019 CLINICAL DATA:  Patient complains of cough and congestion X 1 week. Patient is now having SOB and having to sit up at night to sleep. Hx of pneumonia and HTN. Former smoker. cough, shortness of breath, leg swelling, orthopnea EXAM: CHEST - 2 VIEW COMPARISON:  Radiograph 11/24/2016 FINDINGS: Normal cardiac silhouette. Lungs are hyperinflated. There is increased interstitial markings the lower lobe predominance. No focal consolidation. No pleural fluid. IMPRESSION: Lower lobe increased interstitial markings with differential of multifocal pneumonia (including COVID pneumonia) versus interstitial edema.  These results will be called to the ordering clinician or representative by the Radiologist Assistant, and communication documented in the PACS or [employer]. Electronically Signed   By: Suzy Bouchard M.D.   On: 04/17/2019 11:47   CARDIAC CATHETERIZATION  Result Date: 04/30/2019  Prox RCA lesion is 90% stenosed.  1st Mrg lesion is 20% stenosed.  Prox LAD lesion is 30% stenosed.  Nonischemic dilated cardiomyopathy with mild 30% irregularity in the proximal LAD; normal small ramus immediate vessel; very large dominant left circumflex vessel supplying the entire inferior inferolateral and posterior lateral wall with 20% mild stenosis in the first marginal branch 30% in the second marginal branch; and very small nondominant RCA 90% mid stenosis the vessel size less than 2.0 mm. Mild right heart pressure elevation with mild pulmonary hypertension with mean PA pressure at 30 mm. RECOMMENDATION: Guideline directed medical therapy for dominant nonischemic cardiomyopathy with ultimate initiation of beta blockade, strong blockade, and eventual ARNI therapy.  Medical therapy for focal mid RCA stenosis in the very small caliber vessel.   ECHOCARDIOGRAM COMPLETE  Result Date: 04/26/2019    ECHOCARDIOGRAM REPORT   Patient Name:   Blake West [facility] Date of Exam: 04/26/2019 Medical Rec #:  [id number]     Height:       71.0 in Accession #:    [id number]    Weight:       254.8 lb Date of Birth:  [birth year]     BSA:          2.337 m Patient Age:    69 years      BP:           100/67 mmHg Patient Gender: M             HR:           73 bpm. Exam Location:  Inpatient Procedure: 2D Echo, Cardiac Doppler and Color Doppler Indications:    I50.40* Unspecified combined systolic (congestive) and diastolic                 (congestive) heart failure  History:        Patient has no prior history of Echocardiogram examinations.                 CHF, Abnormal ECG, Signs/Symptoms:Shortness of Breath and  Dyspnea; Risk  Factors:Sleep Apnea and Hypertension.  Sonographer:    Roseanna Rainbow RDCS Referring Phys: [id number] JENNIFER YATES  Sonographer Comments: Patient is morbidly obese and Technically difficult study due to poor echo windows. Image acquisition challenging due to patient body habitus. IMPRESSIONS  1. Severe global reduction in LV systolic function; mild LVH; mildly dilated aortic root; mild MR; mild LAE; moderate RV dysfunction.  2. Left ventricular ejection fraction, by estimation, is 20 to 25%. The left ventricle has severely decreased function. The left ventricle demonstrates global hypokinesis. There is mild left ventricular hypertrophy. Left ventricular diastolic function could not be evaluated.  3. Right ventricular systolic function is moderately reduced. The right ventricular size is normal.  4. Left atrial size was mildly dilated.  5. The mitral valve is normal in structure. Mild mitral valve regurgitation. No evidence of mitral stenosis.  6. The aortic valve is tricuspid. Aortic valve regurgitation is not visualized. Mild aortic valve sclerosis is present, with no evidence of aortic valve stenosis.  7. Aortic dilatation noted. There is mild dilatation of the aortic root measuring 40 mm.  8. The inferior vena cava is dilated in size with <50% respiratory variability, suggesting right atrial pressure of 15 mmHg. FINDINGS  Left Ventricle: Left ventricular ejection fraction, by estimation, is 20 to 25%. The left ventricle has severely decreased function. The left ventricle demonstrates global hypokinesis. The left ventricular internal cavity size was normal in size. There is mild left ventricular hypertrophy. Left ventricular diastolic function could not be evaluated due to atrial fibrillation. Left ventricular diastolic function could not be evaluated. Right Ventricle: The right ventricular size is normal. Right ventricular systolic function is moderately reduced. Left Atrium: Left atrial size was mildly dilated. Right  Atrium: Right atrial size was normal in size. Pericardium: There is no evidence of pericardial effusion. Mitral Valve: The mitral valve is normal in structure. Normal mobility of the mitral valve leaflets. Mild mitral annular calcification. Mild mitral valve regurgitation. No evidence of mitral valve stenosis. Tricuspid Valve: The tricuspid valve is normal in structure. Tricuspid valve regurgitation is trivial. No evidence of tricuspid stenosis. Aortic Valve: The aortic valve is tricuspid. Aortic valve regurgitation is not visualized. Mild aortic valve sclerosis is present, with no evidence of aortic valve stenosis. Pulmonic Valve: The pulmonic valve was normal in structure. Pulmonic valve regurgitation is trivial. No evidence of pulmonic stenosis. Aorta: Aortic dilatation noted. There is mild dilatation of the aortic root measuring 40 mm. Venous: The inferior vena cava is dilated in size with less than 50% respiratory variability, suggesting right atrial pressure of 15 mmHg.  Additional Comments: Severe global reduction in LV systolic function; mild LVH; mildly dilated aortic root; mild MR; mild LAE; moderate RV dysfunction.  LEFT VENTRICLE PLAX 2D LVIDd:         4.90 cm LVIDs:         4.60 cm LV PW:         1.54 cm LV IVS:        1.32 cm LVOT diam:     2.30 cm LV SV:         81 LV SV Index:   35 LVOT Area:     4.15 cm  LV Volumes (MOD) LV vol d, MOD A2C: 130.0 ml LV vol d, MOD A4C: 88.4 ml LV vol s, MOD A2C: 86.6 ml LV vol s, MOD A4C: 72.3 ml LV SV MOD A2C:     43.5 ml LV SV MOD A4C:  88.4 ml LV SV MOD BP:      29.6 ml RIGHT VENTRICLE         IVC TAPSE (M-mode): 1.2 cm  IVC diam: 2.49 cm LEFT ATRIUM             Index       RIGHT ATRIUM           Index LA diam:        5.10 cm 2.18 cm/m  RA Area:     23.10 cm LA Vol (A2C):   84.4 ml 36.11 ml/m RA Volume:   68.70 ml  29.39 ml/m LA Vol (A4C):   78.3 ml 33.50 ml/m LA Biplane Vol: 82.6 ml 35.34 ml/m  AORTIC VALVE             PULMONIC VALVE LVOT Vmax:   102.00  cm/s PR End Diast Vel: 1.65 msec LVOT Vmean:  76.300 cm/s LVOT VTI:    0.196 m  AORTA Ao Root diam: 4.00 cm Ao Asc diam:  2.90 cm MITRAL VALVE MV Area (PHT): 5.27 cm    SHUNTS MV Decel Time: 144 msec    Systemic VTI:  0.20 m MV E velocity: 93.85 cm/s  Systemic Diam: 2.30 cm Kirk Ruths MD Electronically signed by Kirk Ruths MD Signature Date/Time: 04/26/2019/1:19:44 PM    Final       Subjective: Patient seen and examined at bedside, spouse present.  Requesting discharge home.  Patient has been titrate off submental oxygen, no hypoxia noted on ambulation today.  Has outpatient follow-up with cardiology scheduled on 05/09/2019.  Okay to discharge home per cardiology today.  No other complaints or concerns at this time.  Denies headache, no fever/chills/night sweats, no nausea/vomiting/diarrhea, no chest pain, no palpitations, no shortness of breath, no weakness, no fatigue, no paresthesias.  No acute events overnight per nursing staff.  Discharge Exam: Vitals:   05/01/19 0427 05/01/19 0734  BP: 121/86 (!) 114/92  Pulse: 94 99  Resp: 18 20  Temp: (!) 97.5 F (36.4 C) 98.6 F (37 C)  SpO2: 96% 94%   Vitals:   05/01/19 0042 05/01/19 0427 05/01/19 0429 05/01/19 0734  BP: 110/71 121/86  (!) 114/92  Pulse: 94 94  99  Resp: 18 18  20   Temp: 99.6 F (37.6 C) (!) 97.5 F (36.4 C)  98.6 F (37 C)  TempSrc: Oral Oral  Oral  SpO2: 94% 96%  94%  Weight:   102.1 kg   Height:        General: Pt is alert, awake, not in acute distress Cardiovascular: RRR, S1/S2 +, no rubs, no gallops Respiratory: CTA bilaterally, no wheezing, no rhonchi Abdominal: Soft, NT, ND, bowel sounds + Extremities: Trace bilateral lower extremity edema to mid shin, no cyanosis    The results of significant diagnostics from this hospitalization (including imaging, microbiology, ancillary and laboratory) are listed below for reference.     Microbiology: Recent Results (from the past 240 hour(s))  SARS CORONAVIRUS  2 (TAT 6-24 HRS) Nasopharyngeal Nasopharyngeal Swab     Status: None   Collection Time: 04/25/19  3:51 PM   Specimen: Nasopharyngeal Swab  Result Value Ref Range Status   SARS Coronavirus 2 NEGATIVE NEGATIVE Final    Comment: (NOTE) SARS-CoV-2 target nucleic acids are NOT DETECTED. The SARS-CoV-2 RNA is generally detectable in upper and lower respiratory specimens during the acute phase of infection. Negative results do not preclude SARS-CoV-2 infection, do not rule out co-infections with other pathogens, and should  not be used as the sole basis for treatment or other patient management decisions. Negative results must be combined with clinical observations, patient history, and epidemiological information. The expected result is Negative. Fact Sheet for Patients: SugarRoll.be Fact Sheet for Healthcare Providers: https://www.woods-mathews.com/ This test is not yet approved or cleared by the [place] FDA and  has been authorized for detection and/or diagnosis of SARS-CoV-2 by FDA under an Emergency Use Authorization (EUA). This EUA will remain  in effect (meaning this test can be used) for the duration of the COVID-19 declaration under Section 56 4(b)(1) of the Act, 21 U.S.C. section 360bbb-3(b)(1), unless the authorization is terminated or revoked sooner. Performed at [facility], [place] [address] [place], [place] [address]      Labs: BNP (last 3 results) Recent Labs    04/25/19 1416  BNP 7,106.2*   Basic Metabolic Panel: Recent Labs  Lab 04/27/19 0231 04/27/19 0231 04/28/19 0242 04/28/19 0242 04/29/19 0147 04/30/19 0427 04/30/19 1003 05/01/19 0340 05/01/19 0806  NA 143   < > 142  --  140 138 142  141 137  --   K 4.6   < > 3.4*   < > 3.8 4.1 3.8  3.9 5.2* 4.6  CL 100  --  99  --  99 101  --  100  --   CO2 32  --  28  --  27 24  --  25  --   GLUCOSE 115*  --  103*  --  118* 135*  --  116*  --   BUN 29*  --  28*   --  31* 31*  --  32*  --   CREATININE 1.36*  --  1.10  --  1.18 1.11  --  1.16  --   CALCIUM 9.2  --  8.8*  --  8.8* 9.0  --  8.6*  --   MG  --   --  2.3  --   --   --   --  2.8*  --    < > = values in this interval not displayed.   Liver Function Tests: No results for input(s): AST, ALT, ALKPHOS, BILITOT, PROT, ALBUMIN in the last 168 hours. No results for input(s): LIPASE, AMYLASE in the last 168 hours. No results for input(s): AMMONIA in the last 168 hours. CBC: Recent Labs  Lab 04/25/19 2359 04/25/19 2359 04/27/19 0231 04/27/19 0231 04/28/19 0242 04/29/19 0147 04/30/19 0427 04/30/19 1003 05/01/19 0340  WBC 12.2*   < > 12.2*  --  10.2 13.9* 14.2*  --  11.7*  NEUTROABS 7.2  --   --   --   --   --   --   --   --   HGB 15.3   < > 15.4   < > 16.3 16.3 17.5* 18.4*  18.7* 17.8*  HCT 47.2   < > 48.6   < > 50.5 50.9 53.2* 54.0*  55.0* 56.5*  MCV 89.2   < > 90.0  --  89.5 89.3 87.4  --  90.7  PLT 284   < > 287  --  283 303 300  --  310   < > = values in this interval not displayed.   Cardiac Enzymes: No results for input(s): CKTOTAL, CKMB, CKMBINDEX, TROPONINI in the last 168 hours. BNP: Invalid input(s): POCBNP CBG: Recent Labs  Lab 04/30/19 1226 04/30/19 1609 04/30/19 2116 05/01/19 0550 05/01/19 1136  GLUCAP 104* 122* 133* 118*  99   D-Dimer No results for input(s): DDIMER in the last 72 hours. Hgb A1c No results for input(s): HGBA1C in the last 72 hours. Lipid Profile No results for input(s): CHOL, HDL, LDLCALC, TRIG, CHOLHDL, LDLDIRECT in the last 72 hours. Thyroid function studies No results for input(s): TSH, T4TOTAL, T3FREE, THYROIDAB in the last 72 hours.  Invalid input(s): FREET3 Anemia work up No results for input(s): VITAMINB12, FOLATE, FERRITIN, TIBC, IRON, RETICCTPCT in the last 72 hours. Urinalysis No results found for: COLORURINE, APPEARANCEUR, [place], [place], [place], [place], [place], [place], PROTEINUR, UROBILINOGEN, NITRITE,  LEUKOCYTESUR Sepsis Labs Invalid input(s): PROCALCITONIN,  WBC,  LACTICIDVEN Microbiology Recent Results (from the past 240 hour(s))  SARS CORONAVIRUS 2 (TAT 6-24 HRS) Nasopharyngeal Nasopharyngeal Swab     Status: None   Collection Time: 04/25/19  3:51 PM   Specimen: Nasopharyngeal Swab  Result Value Ref Range Status   SARS Coronavirus 2 NEGATIVE NEGATIVE Final    Comment: (NOTE) SARS-CoV-2 target nucleic acids are NOT DETECTED. The SARS-CoV-2 RNA is generally detectable in upper and lower respiratory specimens during the acute phase of infection. Negative results do not preclude SARS-CoV-2 infection, do not rule out co-infections with other pathogens, and should not be used as the sole basis for treatment or other patient management decisions. Negative results must be combined with clinical observations, patient history, and epidemiological information. The expected result is Negative. Fact Sheet for Patients: SugarRoll.be Fact Sheet for Healthcare Providers: https://www.woods-mathews.com/ This test is not yet approved or cleared by the [place] FDA and  has been authorized for detection and/or diagnosis of SARS-CoV-2 by FDA under an Emergency Use Authorization (EUA). This EUA will remain  in effect (meaning this test can be used) for the duration of the COVID-19 declaration under Section 56 4(b)(1) of the Act, 21 U.S.C. section 360bbb-3(b)(1), unless the authorization is terminated or revoked sooner. Performed at [facility], [place] [address] [place], [place] [address]      Time coordinating discharge: Over 30 minutes  SIGNED:   Loyda Costin J [place] DO  [facility] 05/01/2019, 2:39 PM

## 2019-05-01 NOTE — Telephone Encounter (Addendum)
   Attention TOC pool,  This patient will need a TOC phone call after discharge. I suspect he will be discharged today. Follow-up appointment has already been arranged with: 05/09/19 with Thurmond Butts.  He's a patient of Dr. Harrington Challenger for now because she first saw him in the hospital but they prefer to follow up in [facility] so we would suggest establishing with MD there after he sees APP (I sent message to Dr. Fabienne Bruns. Harrington Challenger to get the [facility] - Dr. Saunders Revel called me to say he accepts).   Thank you! Charlie Pitter, PA-C

## 2019-05-01 NOTE — Progress Notes (Addendum)
Progress Note  Patient Name: Blake West Date of Encounter: 05/01/2019  Primary Cardiologist: Dorris Carnes, MD  Subjective   Patient feels good and is lying flat. No further edema. Ambulated to bathroom on his own.  Inpatient Medications    Scheduled Meds:  aspirin EC  81 mg Oral Daily   docusate sodium  100 mg Oral BID   furosemide  80 mg Oral Daily   heparin  5,000 Units Subcutaneous Q8H   insulin aspart  0-5 Units Subcutaneous QHS   insulin aspart  0-9 Units Subcutaneous TID WC   metoprolol succinate  25 mg Oral Daily   sodium chloride flush  3 mL Intravenous Q12H   sodium chloride flush  3 mL Intravenous Q12H   Continuous Infusions:  sodium chloride     sodium chloride     sodium chloride     PRN Meds: sodium chloride, sodium chloride, acetaminophen **OR** acetaminophen, bisacodyl, diazepam, HYDROcodone-acetaminophen, morphine injection, ondansetron (ZOFRAN) IV, ondansetron **OR** [DISCONTINUED] ondansetron (ZOFRAN) IV, polyethylene glycol, sodium chloride flush, sodium chloride flush, zolpidem   Vital Signs    Vitals:   05/01/19 0042 05/01/19 0427 05/01/19 0429 05/01/19 0734  BP: 110/71 121/86  (!) 114/92  Pulse: 94 94  99  Resp: 18 18  20   Temp: 99.6 F (37.6 C) (!) 97.5 F (36.4 C)  98.6 F (37 C)  TempSrc: Oral Oral  Oral  SpO2: 94% 96%  94%  Weight:   102.1 kg   Height:        Intake/Output Summary (Last 24 hours) at 05/01/2019 0858 Last data filed at 05/01/2019 0855 Gross per 24 hour  Intake 1400 ml  Output 1725 ml  Net -325 ml   Last 3 Weights 05/01/2019 04/30/2019 04/30/2019  Weight (lbs) 225 lb 221 lb 1.9 oz 224 lb 6.4 oz  Weight (kg) 102.059 kg 100.3 kg 101.787 kg     Telemetry    NSR/borderline sinus tach with occasional PACs/PVCs - Personally Reviewed  Physical Exam   GEN: No acute distress.  HEENT: Normocephalic, atraumatic, sclera non-icteric. Neck: No JVD or bruits. Cardiac: RRR no murmurs, rubs, or gallops.  Radials/DP/PT 1+ and equal  bilaterally.  Respiratory: Clear to auscultation bilaterally. Breathing is unlabored. GI: Soft, nontender, non-distended, BS +x 4. MS: no deformity. Extremities: No clubbing or cyanosis. No edema. Psoriatic changes on legs. Distal pedal pulses are 2+ and equal bilaterally. Right radial cath site without hematoma or ecchymosis; good pulse. Neuro:  AAOx3. Follows commands. Psych:  Responds to questions appropriately with a normal affect.  Labs    High Sensitivity Troponin:   Recent Labs  Lab 04/25/19 1404 04/25/19 1551  TROPONINIHS 23* 21*      Cardiac EnzymesNo results for input(s): TROPONINI in the last 168 hours. No results for input(s): TROPIPOC in the last 168 hours.   Chemistry Recent Labs  Lab 04/29/19 0147 04/29/19 0147 04/30/19 0427 04/30/19 1003 05/01/19 0340  NA 140   < > 138 142  141 137  K 3.8   < > 4.1 3.8  3.9 5.2*  CL 99  --  101  --  100  CO2 27  --  24  --  25  GLUCOSE 118*  --  135*  --  116*  BUN 31*  --  31*  --  32*  CREATININE 1.18  --  1.11  --  1.16  CALCIUM 8.8*  --  9.0  --  8.6*  GFRNONAA >60  --  >60  --  >  60  GFRAA >60  --  >60  --  >60  ANIONGAP 14  --  13  --  12   < > = values in this interval not displayed.     Hematology Recent Labs  Lab 04/29/19 0147 04/29/19 0147 04/30/19 0427 04/30/19 1003 05/01/19 0340  WBC 13.9*  --  14.2*  --  11.7*  RBC 5.70  --  6.09*  --  6.23*  HGB 16.3   < > 17.5* 18.4*  18.7* 17.8*  HCT 50.9   < > 53.2* 54.0*  55.0* 56.5*  MCV 89.3  --  87.4  --  90.7  MCH 28.6  --  28.7  --  28.6  MCHC 32.0  --  32.9  --  31.5  RDW 15.5  --  15.4  --  15.6*  PLT 303  --  300  --  310   < > = values in this interval not displayed.    BNP Recent Labs  Lab 04/25/19 1416  BNP 2,068.6*     DDimer No results for input(s): DDIMER in the last 168 hours.   Radiology    CARDIAC CATHETERIZATION  Result Date: 04/30/2019  Prox RCA lesion is 90% stenosed.  1st Mrg lesion is 20% stenosed.  Prox LAD lesion  is 30% stenosed.  Nonischemic dilated cardiomyopathy with mild 30% irregularity in the proximal LAD; normal small ramus immediate vessel; very large dominant left circumflex vessel supplying the entire inferior inferolateral and posterior lateral wall with 20% mild stenosis in the first marginal branch 30% in the second marginal branch; and very small nondominant RCA 90% mid stenosis the vessel size less than 2.0 mm. Mild right heart pressure elevation with mild pulmonary hypertension with mean PA pressure at 30 mm. RECOMMENDATION: Guideline directed medical therapy for dominant nonischemic cardiomyopathy with ultimate initiation of beta blockade, strong blockade, and eventual ARNI therapy.  Medical therapy for focal mid RCA stenosis in the very small caliber vessel.    Cardiac Studies   2D echo 04/26/19  1. Severe global reduction in LV systolic function; mild LVH; mildly  dilated aortic root; mild MR; mild LAE; moderate RV dysfunction.   2. Left ventricular ejection fraction, by estimation, is 20 to 25%. The  left ventricle has severely decreased function. The left ventricle  demonstrates global hypokinesis. There is mild left ventricular  hypertrophy. Left ventricular diastolic function  could not be evaluated.   3. Right ventricular systolic function is moderately reduced. The right  ventricular size is normal.   4. Left atrial size was mildly dilated.   5. The mitral valve is normal in structure. Mild mitral valve  regurgitation. No evidence of mitral stenosis.   6. The aortic valve is tricuspid. Aortic valve regurgitation is not  visualized. Mild aortic valve sclerosis is present, with no evidence of  aortic valve stenosis.   7. Aortic dilatation noted. There is mild dilatation of the aortic root  measuring 40 mm.   8. The inferior vena cava is dilated in size with <50% respiratory  variability, suggesting right atrial pressure of 15 mmHg  R/LHC 05/01/19  Prox RCA lesion is 90%  stenosed. 1st Mrg lesion is 20% stenosed. Prox LAD lesion is 30% stenosed.   Nonischemic dilated cardiomyopathy with mild 30% irregularity in the proximal LAD; normal small ramus immediate vessel; very large dominant left circumflex vessel supplying the entire inferior inferolateral and posterior lateral wall with 20% mild stenosis in the first marginal branch 30% in  the second marginal branch; and very small nondominant RCA 90% mid stenosis the vessel size less than 2.0 mm.   Mild right heart pressure elevation with mild pulmonary hypertension with mean PA pressure at 30 mm.  Right Heart Pressures RA: A-wave 9, V wave 9; mean 7 RV: 35/7 PA: 36/24; mean 30 PW: A-wave 17 V wave 16; mean 15  Ao: 117/78 LV: 117/12  Oxygen saturation in the PA 62% and in the ao 86%.  By the [facility] method cardiac output 5.2 L/min with cardiac index 2.3 L/min/m.  PVR: 2.9 WU.   LVEDP listed at 71mmHg   RECOMMENDATION: Guideline directed medical therapy for dominant nonischemic cardiomyopathy with ultimate initiation of beta blockade, strong blockade, and eventual ARNI therapy.  Medical therapy for focal mid RCA stenosis in the very small caliber vessel.  Patient Profile     69 y.o. male with history of hypothyroidism, OSA on CPAP, obesity, HTN and palpitations (PACs/PVCs by prior monitor) who was admitted with worsening SOB, edema, nonproductive cough, wheezing and orthopnea. Found to have uncontrolled HTN and new onset CHF. 2D echo showed severe LV dysfunction with EF 20-25%, moderately reduced RV function, mild LVH, mild LAE, mild MR, mild dilation of aortic root. Covid test was negative. Telemetry has shown PACs, PVCs, NSVT.   Per admitting H/P "He is uncertain if he will be willing to take medications at home. His wife is Native [place] and a Naturalist with a degree in herbatology and they prefer a holistic medicine approach." See cardiology note 04/29/19 for discussion on this. Family previously  experienced bad outcomes with family members undergoing in the past hence they were wary for aggressive approach. After their discussion with outside sources, they agreed to undergo [facility] 04/30/19 with findings above - moderate CAD but felt primarily NICM.  Assessment & Plan    1. Acute systolic biventricular heart failure with newly diagnosed cardiomyopathy and hypoxia - has diuresed from 260lb to 225lb, -21L with additional unmeasured urine occurrences. Clinically he looks good. We have transitioned to oral Lasix today. K was elevated at 5.2 but with mention of hemolysis. Repeat value pending. (Discontinued supplemental K.) Continue BB. HR remains borderline controlled so may benefit from additional titration. Will need to consider ARB contingent on potassium value, and eventually potentially spironolactone. Do not think he would tolerate ARNI due to low-normal BP. Will need assessment for home O2 prior to discharge. Would also consider arranging cMRI as outpatient to evaluate for any infiltrative process that could contribute to his cardiomyopathy.   2. Moderate CAD - medical therapy recommended. Continue ASA, BB. Pt declined statin.  3. Newly recognized diabetes mellitus - patient was skeptical of diagnosis but we discussed how this was determined (A1C 6.7). Further management per IM.   4. NSVT, PACs, PVCs - BB started 4/6, would continue. No further NSVT on telemetry.    5. AKI - peak Cr 1.36, currently 1.16 and stable post-cath. Suspect this is his new baseline c/w CKD stage II.   6. OSA - pt has declined formal CPAP, is being offered nasal pillow which IM is helping to arrange.   7. Hyperlipidemia - patient refused statin therapy. Can revisit as outpatient.   8. Hypothyroidism - TSH 11.1. Further per IM.  9. Leukocytosis, elevated Hgb - Further per primary team. Consider OP heme eval. He does report family history of issues with iron.  Anticipate probable DC today, await MD round. Spoke  with patient's wife and they prefer to f/u in [place]  instead of [place]. Dr. Margaretann Loveless and I feel he would be a good fit for Dr. Saunders Revel, so I sent an Epic message to Drs. Ross and End to OK the swap. Dr. Saunders Revel called me to discuss and accepts. Per our discussion, I have arranged a TOC f/u 4/15 in the [place] office and put appt info on AVS. He will first follow up with one of our [place] APPs for his transition of care visit then will establish with MD there thereafter.  For questions or updates, please contact [place] Please consult [email or web address] for contact info under Cardiology/STEMI.  Signed, Charlie Pitter, PA-C 05/01/2019, 8:58 AM    Patient seen and examined with Melina Copa, PA-C.  Agree as above, with the following exceptions and changes as noted below. Feeling well after cath yesterday, hoping to get his oxygen checked. Gen: NAD, CV: RRR, no murmurs, Lungs: clear, Abd: soft, Extrem: Warm, well perfused, no edema, Neuro/Psych: alert and oriented x 3, normal mood and affect. All available labs, radiology testing, previous records reviewed. Discussed GDMT for HF, starting BB, will discuss starting ARB other than losartan as an outpatient at [facility] follow up with our [facility] practice. Will d/c on oral lasix, recheck labs in 1 week. Needs to establish PCP.  Elouise Munroe 05/01/19 12:30 PM

## 2019-05-01 NOTE — Progress Notes (Signed)
Inpatient Diabetes Program Recommendations  AACE/ADA: New Consensus Statement on Inpatient Glycemic Control (2015)  Target Ranges:  Prepandial:   less than 140 mg/dL      Peak postprandial:   less than 180 mg/dL (1-2 hours)      Critically ill patients:  140 - 180 mg/dL   Lab Results  Component Value Date   GLUCAP 99 05/01/2019   HGBA1C 6.7 (H) 04/25/2019    Review of Glycemic Control  Results for Blake West, Blake West (MRN [id number]) as of 05/01/2019 14:46  Ref. Range 04/30/2019 12:26 04/30/2019 16:09 04/30/2019 21:16 05/01/2019 05:50 05/01/2019 11:36  Glucose-Capillary Latest Ref Range: 70 - 99 mg/dL 104 (H) 122 (H) 133 (H) 118 (H) 99    Diabetes history: Pre-DM Outpatient Diabetes medications: None Current orders for Inpatient glycemic control: Novolog 0-9 TID + 0-5 QHS  Note:  Referral for inpatient diabetes consult for new DM received.  Spoke with patient and wife at bedside.  Reviewed patient's current A1c of 6.7%. Explained what a A1c is and what it measures. Also reviewed goal A1c with patient, importance of good glucose control @ home, and blood sugar goals.  He states he was diagnosed with pre-diabetes around 5 years ago and has managed with diet and exercise.  He is not interested in starting on oral medication.  We reviewed diet and CHO.  He does not drink any sugary drinks.  He states he will work harder on diet and exercise to bring down his A1C.  He does not current have a PCP but is in the process of obtaining on.  He has a meter at home and will check his BS before breakfast and mi-afternoon and bring meter to PCP appointment.  Per patient, plan to DC today.  Thank you, Reche Dixon, RN, BSN Diabetes Coordinator Inpatient Diabetes Program [phone] (team pager from 8a-5p)

## 2019-05-01 NOTE — TOC Progression Note (Addendum)
Transition of Care [facility]) - Progression Note    Patient Details  Name: Blake West MRN: [id number] Date of Birth: [birth year]  [facility] CM/SW Contact  Zenon Mayo, RN Phone Number: 05/01/2019, 2:15 PM  Clinical Narrative:    zach with [employer] will speak with patient about the nasal pillow that he has cpap machine with [employer] already. NCM assisted patient with PCP in [employer], apt on AVS , RN to print out and give to patient.    Expected Discharge Plan: Home/Self Care Barriers to Discharge: Continued Medical Work up  Expected Discharge Plan and Services Expected Discharge Plan: Home/Self Care   Discharge Planning Services: CM Consult Post Acute Care Choice: Durable Medical Equipment Living arrangements for the past 2 months: Single Family Home                 DME Arranged: Other see comment(nasal pillow for CPAP) DME Agency: [employer] Date DME Agency Contacted: 04/29/19 Time DME Agency Contacted: 1 Representative spoke with at DME Agency: [place]: NA           Social Determinants of Health ([place]) Interventions    Readmission Risk Interventions No flowsheet data found.

## 2019-05-01 NOTE — Progress Notes (Signed)
SATURATION QUALIFICATIONS: (This note is used to comply with regulatory documentation for home oxygen)  Patient Saturations on Room Air at Rest = 95%  Patient Saturations on Room Air while Ambulating = 93%   Please briefly explain why patient needs home oxygen: patient does not need home oxygen

## 2019-05-03 ENCOUNTER — Encounter: Payer: Self-pay | Admitting: Physician Assistant

## 2019-05-03 NOTE — Progress Notes (Signed)
Cardiology Office Note    Date:  05/09/2019   ID:  AZURE BARRALES, DOB 01-Feb-1950, MRN [id number]  PCP:  Marval Regal, NP  Cardiologist:  Nelva Bush, MD  Electrophysiologist:  None   Chief Complaint: Hospital follow up  History of Present Illness:   Blake West is a 69 y.o. male with history of moderate CAD medically managed as outlined below, BiV failure with HFrEF out of proportion to his CAD and felt to be NICM, pulmonary hypertension, PVCs/NSVT, PACs, newly diagnosed DM, obesity, OSA not on CPAP, HLD, prior tobacco use, reported hemochromatosis, and hypothyroidism who presents for hospital follow up after recent admission to [facility] from 4/1 to 4/7 for acute BiV failure with newly diagnosed cardiomyopathy.   He was previously evaluated by Dr. Harrington Challenger in 2016 at the request of his PCP for abnormal EKG which demonstrated sinus rhythm with first degree AV block, PACs and possible PVCs. 24-hour Holter at that time showed sinus rhythm with occasional PVCs representing a 1% burden.   More recently, he was admitted to [facility] in early 04/2019 with worsening SOB, edema, nonproductive cough, wheezing, and orthopnea who was found to be hypoxic requiring supplemental oxygen in the setting of acute systolic BiV failure with newly diagnosed cardiomyopathy felt to be nonischemic with moderate CAD. Echo on 04/26/2019, showed a new cardiomyopathy with an EF of 20-25%, global hypokinesis, mild LVH, moderately reduced RV systolic function with normal RV cavity size, mildly dilated left atrium, mild mitral regurgitation, trileaflet aortic valve with mild insufficiency, and a mildly dilated aortic root measuring 40 mm. Diagnostic R/LHC on 04/30/2019, showed nonischemic dilated cardiomyopathy with mild 30% irregularity in the proximal LAD, normal small ramus, very large dominant LCx supplying the entire inferior inferolateral and posterior lateral wall with 20% mild stenosis in the OM1 and 30% stenosis in  the OM2, and a very small nondominant RCA with 90% mid stenosis with a vessel size less than 2 mm. RHC showed mild right heart pressure elevation with mild pulmonary hypertension with a mean PA pressure of 30 mmHg. Medical therapy was recommended. During his admission, he was IV diuresed with improvement in symptoms with a weight loss of 260lb to 225lb at discharge, with at least 21L net negative for the admission.   While admitted, telemetry showed SR with borderline sinus tachycardia with occasional PACs, PVCs, and NSVT.   He was discharged on ASA, Toprol XL, and Lasix. ARB/MRA were not started prior to discharge secondary mildly elevated potassium, which improved on recheck. ARNI was not started secondary to low-normal BP. He declined medication for newly diagnosed diabetes and statin.  He comes in accompanied by his wife today.  He is doing well from a cardiac perspective.  No chest pain, shortness of breath, dizziness, presyncope, syncope.  Lower extremity swelling continues to improve.  No abdominal distention, PND, orthopnea, or early satiety.  He does continue to add some sea salt to food.  He is drinking less than 2 L of fluid per day.  His weight has remained stable between 223 and 225 pounds by his home scale.  His energy is improving.  Blood pressure remains well controlled.  He is tolerating aspirin, Lasix, and Toprol-XL without issues.  He and his wife continue to prefer deferring escalation of GDMT.  They are interested in pursuing cardiac MRI.   Labs independently reviewed: 04/2019 - potassium 4.6, magnesium 2.8, BUN 32, SCr 1.16, WBC 11.7, HGB 17.8, PLT 310, A1c 6.7, TC 156,  TG 88, HDL 25, LDL 113, TSH 11.128, BNP 2068, high-sensitivity troponin 23 with a delta of 21, covid negative   Past Medical History:  Diagnosis Date  . Abnormal EKG   . Biventricular failure ([place])   . CAD (coronary artery disease)   . Gout   . Hyperlipidemia LDL goal <70   . Hypertension   . Hypothyroidism     . Kidney stone   . Pneumonia   . Testicular hypofunction   . Type 2 diabetes mellitus ([place]) 04/26/2019    Past Surgical History:  Procedure Laterality Date  . CARDIAC CATHETERIZATION    . FOOT SURGERY    . RIGHT/LEFT HEART CATH AND CORONARY ANGIOGRAPHY N/A 04/30/2019   Procedure: RIGHT/LEFT HEART CATH AND CORONARY ANGIOGRAPHY;  Surgeon: Troy Sine, MD;  Location: [facility];  Service: Cardiovascular;  Laterality: N/A;    Current Medications: Current Meds  Medication Sig  . Ascorbic Acid (VITAMIN C) 100 MG tablet Take 500 mg by mouth daily.  Marland Kitchen aspirin EC 81 MG EC tablet Take 1 tablet (81 mg total) by mouth daily.  . cholecalciferol (VITAMIN D) 400 UNITS TABS tablet Take 400 Units by mouth 3 (three) times daily.   . furosemide (LASIX) 80 MG tablet Take 1 tablet (80 mg total) by mouth daily.  . metoprolol succinate (TOPROL-XL) 25 MG 24 hr tablet Take 1 tablet (25 mg total) by mouth daily.  . Multiple Vitamin (THERA) TABS Take 1 tablet by mouth daily.  . NON FORMULARY Take 1 capsule by mouth with breakfast, with lunch, and with evening meal. Alphalplex  . OVER THE COUNTER MEDICATION Thyroid otc medicine Simplex m  . Vitamin E 400 units TABS Take 400 Units by mouth 2 (two) times daily.     Allergies:   Codeine   Social History   Socioeconomic History  . Marital status: Single    Spouse name: Not on file  . Number of children: Not on file  . Years of education: Not on file  . Highest education level: Not on file  Occupational History  . Occupation: Chief Financial Officer  Tobacco Use  . Smoking status: Former Smoker    Quit date: 01/24/2002    Years since quitting: 17.2  . Smokeless tobacco: Never Used  Substance and Sexual Activity  . Alcohol use: No  . Drug use: No  . Sexual activity: Not on file  Other Topics Concern  . Not on file  Social History Narrative  . Not on file   Social Determinants of Health   Financial Resource Strain:   . Difficulty of  Paying Living Expenses:   Food Insecurity:   . Worried About Charity fundraiser in the Last Year:   . Arboriculturist in the Last Year:   Transportation Needs:   . Film/video editor (Medical):   Marland Kitchen Lack of Transportation (Non-Medical):   Physical Activity:   . Days of Exercise per Week:   . Minutes of Exercise per Session:   Stress:   . Feeling of Stress :   Social Connections:   . Frequency of Communication with Friends and Family:   . Frequency of Social Gatherings with Friends and Family:   . Attends Religious Services:   . Active Member of Clubs or Organizations:   . Attends Archivist Meetings:   Marland Kitchen Marital Status:      Family History:  The patient's family history includes Congestive Heart Failure in his brother; Heart attack (  age of onset: 8) in his mother; Pulmonary fibrosis in his father.  ROS:   Review of Systems  Constitutional: Positive for malaise/fatigue. Negative for chills, diaphoresis, fever and weight loss.  HENT: Negative for congestion.   Eyes: Negative for discharge and redness.  Respiratory: Negative for cough, sputum production, shortness of breath and wheezing.   Cardiovascular: Positive for leg swelling. Negative for chest pain, palpitations, orthopnea, claudication and PND.       Improving lower extremity swelling  Gastrointestinal: Negative for abdominal pain, heartburn, nausea and vomiting.  Musculoskeletal: Negative for falls and myalgias.  Skin: Negative for rash.  Neurological: Negative for dizziness, tingling, tremors, sensory change, speech change, focal weakness, loss of consciousness and weakness.  Endo/Heme/Allergies: Does not bruise/bleed easily.  Psychiatric/Behavioral: Negative for substance abuse. The patient is not nervous/anxious.   All other systems reviewed and are negative.    EKGs/Labs/Other Studies Reviewed:    Studies reviewed were summarized above. The additional studies were reviewed today:  2D echo  04/26/2019: 1. Severe global reduction in LV systolic function; mild LVH; mildly  dilated aortic root; mild MR; mild LAE; moderate RV dysfunction.  2. Left ventricular ejection fraction, by estimation, is 20 to 25%. The  left ventricle has severely decreased function. The left ventricle  demonstrates global hypokinesis. There is mild left ventricular  hypertrophy. Left ventricular diastolic function  could not be evaluated.  3. Right ventricular systolic function is moderately reduced. The right  ventricular size is normal.  4. Left atrial size was mildly dilated.  5. The mitral valve is normal in structure. Mild mitral valve  regurgitation. No evidence of mitral stenosis.  6. The aortic valve is tricuspid. Aortic valve regurgitation is not  visualized. Mild aortic valve sclerosis is present, with no evidence of  aortic valve stenosis.  7. Aortic dilatation noted. There is mild dilatation of the aortic root  measuring 40 mm.  8. The inferior vena cava is dilated in size with <50% respiratory  variability, suggesting right atrial pressure of 15 mmHg.  __________   [facility] 04/30/2019: Prox RCA lesion is 90% stenosed.  1st Mrg lesion is 20% stenosed.  Prox LAD lesion is 30% stenosed.   Nonischemic dilated cardiomyopathy with mild 30% irregularity in the proximal LAD; normal small ramus immediate vessel; very large dominant left circumflex vessel supplying the entire inferior inferolateral and posterior lateral wall with 20% mild stenosis in the first marginal branch 30% in the second marginal branch; and very small nondominant RCA 90% mid stenosis the vessel size less than 2.0 mm.  Mild right heart pressure elevation with mild pulmonary hypertension with mean PA pressure at 30 mm.  Right Heart Pressures RA: A-wave 9, V wave 9; mean 7 RV: 35/7 PA: 36/24; mean 30 PW: A-wave 17 V wave 16; mean 15  Ao: 117/78 LV: 117/12  Oxygen saturation in the PA 62% and in the ao 86%. By the  [facility] method cardiac output 5.2 L/min with cardiac index 2.3 L/min/m.  PVR: 2.9 WU.   LVEDP listed at 74mmHg   RECOMMENDATION: Guideline directed medical therapy for dominant nonischemic cardiomyopathy with ultimate initiation of beta blockade, strong blockade, and eventual ARNI therapy.  Medical therapy for focal mid RCA stenosis in the very small caliber vessel.   EKG:  EKG is ordered today.  The EKG ordered today demonstrates NSR, 94 bpm, first-degree AV block, occasional PACs, rare PVCs, anterolateral T wave inversion which is new when compared to prior study, baseline artifact   Recent  Labs: 04/25/2019: B Natriuretic Peptide 2,068.6; TSH 11.128 05/01/2019: BUN 32; Creatinine, Ser 1.16; Hemoglobin 17.8; Magnesium 2.8; Platelets 310; Potassium 4.6; Sodium 137  Recent Lipid Panel    Component Value Date/Time   CHOL 156 04/25/2019 1919   TRIG 88 04/25/2019 1919   HDL 25 (L) 04/25/2019 1919   CHOLHDL 6.2 04/25/2019 1919   VLDL 18 04/25/2019 1919   LDLCALC 113 (H) 04/25/2019 1919    PHYSICAL EXAM:    VS:  BP 120/82 (BP Location: Left Arm, Patient Position: Sitting, Cuff Size: Large)   Pulse 94   Ht 5\' 11"  (1.803 m)   Wt 225 lb 2 oz (102.1 kg)   SpO2 96%   BMI 31.40 kg/m   BMI: Body mass index is 31.4 kg/m.  Physical Exam  Constitutional: He is oriented to person, place, and time. He appears well-developed and well-nourished.  HENT:  Head: Normocephalic and atraumatic.  Eyes: Right eye exhibits no discharge. Left eye exhibits no discharge.  Neck: No JVD present.  Cardiovascular: Normal rate, regular rhythm, S1 normal, S2 normal and normal heart sounds. Exam reveals no distant heart sounds, no friction rub, no midsystolic click and no opening snap.  No murmur heard. Pulses:      Posterior tibial pulses are 2+ on the right side and 2+ on the left side.  Cardiac cath site well-healing without active bleeding.  Radial pulse 2+.  Pulmonary/Chest: Effort normal and breath  sounds normal. No respiratory distress. He has no decreased breath sounds. He has no wheezes. He has no rales. He exhibits no tenderness.  Abdominal: Soft. He exhibits no distension. There is no abdominal tenderness.  Musculoskeletal:        General: Edema present.     Cervical back: Normal range of motion.     Comments: Trace bilateral pretibial edema  Neurological: He is alert and oriented to person, place, and time.  Skin: Skin is warm and dry. No cyanosis. Nails show no clubbing.  Psychiatric: He has a normal mood and affect. His speech is normal and behavior is normal. Judgment and thought content normal.    Wt Readings from Last 3 Encounters:  05/09/19 225 lb 2 oz (102.1 kg)  05/01/19 225 lb (102.1 kg)  09/17/15 260 lb 8 oz (118.2 kg)     ASSESSMENT & PLAN:   1. Chronic systolic BiV failure secondary to NICM: He comes in doing well today.  He is euvolemic and well compensated.  His cardiomyopathy is out of proportion to his underlying moderate CAD.  Discussed optimization of GDMT in detail with patient and wife today.  They continue to decline escalation of evidence-based therapy including addition of ACE inhibitor/ARB/MRA/ARNI.  This can be revisited with them in follow-up.  Schedule cardiac MRI.  Continue Toprol-XL and Lasix.  Check BMP.  CHF education.  Referral to cardiac rehab.  2. CAD involving the native coronary arteries without angina: He was noted to have moderate CAD on diagnostic cath during recent admission as outlined above recommendation to continue medical management and risk factor modification.  Continue aspirin and metoprolol.  He has declined statin therapy as outlined below.  No post-cath complications.  Refer to cardiac rehab.  No plans for further ischemic evaluation at this time.  3. PACs/PVCs/NSVT: Asymptomatic.  Improved with Toprol-XL.  Check potassium level with BMP today.  4. History of AKI: Improved at discharge.  Check BMP today.  5. HLD: 113 with goal  being less than 70 with moderate CAD noted on  diagnostic cath.  He has refused statin therapy.  Continue to revisit this in follow-up.  6. Hypothyroidism: During recent admission TSH elevated at 11.1.  Follow-up with PCP.  This was not addressed at today's visit.  7. OSA: He has declined CPAP.    8. Leukocytosis/elevated Hgb: Patient and wife indicate a history of hemochromatosis which has been previously treated with intermittent phlebotomy.  Follow-up with PCP as directed.  PCP could consider referral to hematology if this has not previously been obtained.    Disposition: F/u with Dr. Saunders Revel, to establish care in 1 month.   Medication Adjustments/Labs and Tests Ordered: Current medicines are reviewed at length with the patient today.  Concerns regarding medicines are outlined above. Medication changes, Labs and Tests ordered today are summarized above and listed in the Patient Instructions accessible in Encounters.   Signed, Christell Faith, PA-C 05/09/2019 11:35 AM     [facility] - [place], [place] [address] [phone]

## 2019-05-06 ENCOUNTER — Encounter: Payer: Self-pay | Admitting: Physician Assistant

## 2019-05-06 NOTE — Telephone Encounter (Signed)
1st attempt at [facility]. No answer. Left message to call back.

## 2019-05-06 NOTE — Progress Notes (Signed)
This note is to document that I received permission from Dr. Harrington Challenger and Dr. Saunders Revel for patient to transition from Dr. Harrington Challenger to Dr. Saunders Revel for primary cardiologist. Melina Copa PA-C

## 2019-05-07 NOTE — Telephone Encounter (Signed)
Patient's wife contacted regarding discharge from [facility] on 05/01/19.  Patient's wife answered the following questions: Patient understands to follow up with provider Christell Faith, PA on 05/09/19 at [facility] [place]. Patient understands discharge instructions? yes Patient understands medications and regiment? yes Patient understands to bring all medications to this visit? yes  Ask patient:  Are you enrolled in My Chart (no)  Not interested at this time because they do not have a working computer and do not know how to use smartphones.

## 2019-05-08 ENCOUNTER — Ambulatory Visit: Payer: PPO | Admitting: Physician Assistant

## 2019-05-09 ENCOUNTER — Encounter: Payer: Self-pay | Admitting: Physician Assistant

## 2019-05-09 ENCOUNTER — Other Ambulatory Visit: Payer: Self-pay

## 2019-05-09 ENCOUNTER — Encounter: Payer: Self-pay | Admitting: Nurse Practitioner

## 2019-05-09 ENCOUNTER — Ambulatory Visit: Payer: PPO | Admitting: Nurse Practitioner

## 2019-05-09 ENCOUNTER — Ambulatory Visit (INDEPENDENT_AMBULATORY_CARE_PROVIDER_SITE_OTHER): Payer: PPO | Admitting: Physician Assistant

## 2019-05-09 VITALS — BP 98/62 | HR 82 | Temp 96.6°F | Resp 16 | Ht 71.0 in | Wt 229.8 lb

## 2019-05-09 VITALS — BP 120/82 | HR 94 | Ht 71.0 in | Wt 225.1 lb

## 2019-05-09 DIAGNOSIS — E1159 Type 2 diabetes mellitus with other circulatory complications: Secondary | ICD-10-CM | POA: Diagnosis not present

## 2019-05-09 DIAGNOSIS — I5021 Acute systolic (congestive) heart failure: Secondary | ICD-10-CM | POA: Diagnosis not present

## 2019-05-09 DIAGNOSIS — I2510 Atherosclerotic heart disease of native coronary artery without angina pectoris: Secondary | ICD-10-CM | POA: Diagnosis not present

## 2019-05-09 DIAGNOSIS — Z87898 Personal history of other specified conditions: Secondary | ICD-10-CM | POA: Diagnosis not present

## 2019-05-09 DIAGNOSIS — Z23 Encounter for immunization: Secondary | ICD-10-CM | POA: Diagnosis not present

## 2019-05-09 DIAGNOSIS — E661 Drug-induced obesity: Secondary | ICD-10-CM | POA: Diagnosis not present

## 2019-05-09 DIAGNOSIS — M1A09X0 Idiopathic chronic gout, multiple sites, without tophus (tophi): Secondary | ICD-10-CM

## 2019-05-09 DIAGNOSIS — I5082 Biventricular heart failure: Secondary | ICD-10-CM | POA: Diagnosis not present

## 2019-05-09 DIAGNOSIS — D72829 Elevated white blood cell count, unspecified: Secondary | ICD-10-CM

## 2019-05-09 DIAGNOSIS — E785 Hyperlipidemia, unspecified: Secondary | ICD-10-CM

## 2019-05-09 DIAGNOSIS — I472 Ventricular tachycardia: Secondary | ICD-10-CM

## 2019-05-09 DIAGNOSIS — I10 Essential (primary) hypertension: Secondary | ICD-10-CM

## 2019-05-09 DIAGNOSIS — G4733 Obstructive sleep apnea (adult) (pediatric): Secondary | ICD-10-CM

## 2019-05-09 DIAGNOSIS — E039 Hypothyroidism, unspecified: Secondary | ICD-10-CM | POA: Diagnosis not present

## 2019-05-09 DIAGNOSIS — Z1159 Encounter for screening for other viral diseases: Secondary | ICD-10-CM

## 2019-05-09 DIAGNOSIS — Z6836 Body mass index (BMI) 36.0-36.9, adult: Secondary | ICD-10-CM

## 2019-05-09 DIAGNOSIS — N179 Acute kidney failure, unspecified: Secondary | ICD-10-CM | POA: Diagnosis not present

## 2019-05-09 DIAGNOSIS — Z125 Encounter for screening for malignant neoplasm of prostate: Secondary | ICD-10-CM

## 2019-05-09 DIAGNOSIS — D582 Other hemoglobinopathies: Secondary | ICD-10-CM

## 2019-05-09 DIAGNOSIS — I428 Other cardiomyopathies: Secondary | ICD-10-CM | POA: Diagnosis not present

## 2019-05-09 DIAGNOSIS — Z9989 Dependence on other enabling machines and devices: Secondary | ICD-10-CM | POA: Diagnosis not present

## 2019-05-09 DIAGNOSIS — I493 Ventricular premature depolarization: Secondary | ICD-10-CM | POA: Diagnosis not present

## 2019-05-09 DIAGNOSIS — I4729 Other ventricular tachycardia: Secondary | ICD-10-CM

## 2019-05-09 DIAGNOSIS — E7849 Other hyperlipidemia: Secondary | ICD-10-CM | POA: Diagnosis not present

## 2019-05-09 DIAGNOSIS — I491 Atrial premature depolarization: Secondary | ICD-10-CM

## 2019-05-09 NOTE — Patient Instructions (Addendum)
Medication Instructions:  Your physician recommends that you continue on your current medications as directed. Please refer to the Current Medication list given to you today.  *If you need a refill on your cardiac medications before your next appointment, please call your pharmacy*   Lab Work: 1- Your physician recommends that you have lab work today(BMET)  If you have labs (blood work) drawn today and your tests are completely normal, you will receive your results only by: Marland Kitchen MyChart Message (if you have MyChart) OR . A paper copy in the mail If you have any lab test that is abnormal or we need to change your treatment, we will call you to review the results.   Testing/Procedures: 1- Call for scheduling. You are scheduled for Cardiac MRI on . Please arrive at the [facility] main entrance of [facility] at  (30-45 minutes prior to test start time). ?  [facility]  [address]  [place], [place] [address]  [phone]  Proceed to the [facility] Radiology Department (First Floor).  ?  Magnetic resonance imaging (MRI) is a painless test that produces images of the inside of the body without using X-rays. During an MRI, strong magnets and radio waves work together in a Research officer, political party to form detailed images. MRI images may provide more details about a medical condition than X-rays, CT scans, and ultrasounds can provide.  You may be given earphones to listen for instructions.  You may eat a light breakfast and take medications as ordered with the exception of lasix (fluid pill, other). If a contrast material will be used, an IV will be inserted into one of your veins. Contrast material will be injected into your IV.  You will be asked to remove all metal, including: Watch, jewelry, and other metal objects including hearing aids, hair pieces and dentures. (Braces and fillings normally are not a problem.)  If contrast material was used:  It will leave your body through  your urine within a day. You may be told to drink plenty of fluids to help flush the contrast material out of your system.  TEST WILL TAKE APPROXIMATELY 1 HOUR  PLEASE NOTIFY SCHEDULING AT LEAST 24 HOURS IN ADVANCE IF YOU ARE UNABLE TO KEEP YOUR APPOINTMENT.      Follow-Up: At [facility], you and your health needs are our priority.  As part of our continuing mission to provide you with exceptional heart care, we have created designated Provider Care Teams.  These Care Teams include your primary Cardiologist (physician) and Advanced Practice Providers (APPs -  Physician Assistants and Nurse Practitioners) who all work together to provide you with the care you need, when you need it.  We recommend signing up for the patient portal called "MyChart".  Sign up information is provided on this After Visit Summary.  MyChart is used to connect with patients for Virtual Visits (Telemedicine).  Patients are able to view lab/test results, encounter notes, upcoming appointments, etc.  Non-urgent messages can be sent to your provider as well.   To learn more about what you can do with MyChart, go to [email or web address].    Your next appointment:   4-6 week(s)  The format for your next appointment:   In Person  Provider:   Nelva Bush, MD Please see Dr. Saunders Revel to establish primary cardiologist   Other Instructions 1- Cardiac Rehab Referral [facility]) [phone]  Cardiac Rehabilitation When you've been diagnosed with a heart condition  or need assistance recovering from cardiac surgery, turn to [facility] for specialized rehabilitation that helps you improve your health and prevent future cardiovascular problems.   Is Cardiac Rehabilitation for Me? You may be eligible for cardiac rehabilitation at [facility] if you've recently experienced:  Cardiomyopathy Chest pain (stable angina) Congestive heart failure Coronary angioplasty Coronary artery  bypass surgery (CABG) Heart attack Heart transplant Heart valve surgery  What Happens in Cardiac Rehab? You'll attend cardiac rehab two or three times a week to participate in:  Closely-monitored exercise in a group setting Nutrition counseling with a registered dietitian Education about heart function, risk factor reduction and stress management  Encourage your family and caregivers to join you for education sessions.   Individualized Care Plan Partner with your cardiac rehabilitation team to set goals and create a rehabilitation care plan customized to your needs. At [facility], you'll receive one-on-one attention from our specialists as you enjoy the camaraderie and support of your peers.   Emotional Support Your emotional well-being is just as important as your physical health. At [facility], you'll find a team of professionals who provide emotional support and resources to help you feel your best.   How Cardiac Rehabilitation Helps [place]'s cardiac rehabilitation programs may help you:  Lower your heart rate and blood pressure Increase your strength and stamina Improve your self-confidence and overall well-being Manage your weight Prevent future heart problems, hospitalizations and related healthcare costs Reduce stress

## 2019-05-09 NOTE — Progress Notes (Signed)
New Patient Office Visit  Subjective:  Patient ID: Blake West, male    DOB: [birth year]  Age: 69 y.o. MRN: [id number]  CC:  Chief Complaint  Patient presents with  . Establish Care    New Patient    HPI Blake West is a 69 y.o. male  with history of essential hypertension, hypothyroidism, hemochromatosis, gout, sleep apnea ordered CPAP but not using, obesity, hyperlipidemia, newly diagnosed diabetes mellitus, who presents to establish care with a PCP following a recent admission [facility] from 04/25/19 to 05/01/19 for acute BiV failure with newly diagnosed cardiomyopathy felt to be nonischemic with moderate CAD. He does not see physicians secondary to his wife is Native Optometrist and a Theatre stage manager and they prefer holistic medicine approach.  He and his wife have decided not to accept the usual western medication for his health problems.  CHF:  Followed by cardiology and was seen by Christell Faith, PA this morning who recommended starting evidence-based medicine ACE inhibitor/ARB/MRA/ARNI.  He is on Toprol-XL, Lasix. ASA  He recommended referral to cardiac lab, schedule cardiac MRI, check BMP and CHF education. Echo on 04/26/2019, showed a new cardiomyopathy with an EF of 20-25%, global hypokinesis, mild LVH, moderately reduced RV systolic function with normal RV cavity size, mildly dilated left atrium, mild mitral regurgitation, trileaflet aortic valve with mild insufficiency, and a mildly dilated aortic root measuring 40 mm.    CAD involving the native coronary arteries without angina: Diagnostic cath showed moderate CAD, with recommendation to continue medical management and risk factor modification.  He is on aspirin and metoprolol.  He has declined statin therapy. Former smoker quit date 2004 never used smokeless tobacco. 04/30/2019: Diagnostic R/LHC:  nonischemic dilated cardiomyopathy with mild 30% irregularity in the proximal LAD, normal small ramus, very large dominant LCx supplying the entire  inferior inferolateral and posterior lateral wall with 20% mild stenosis in the OM1 and 30% stenosis in the OM2, and a very small nondominant RCA with 90% mid stenosis with a vessel size less than 2 mm. RHC showed mild right heart pressure elevation with mild pulmonary hypertension with a mean PA pressure of 30 mmHg. Medical therapy was recommended. During his admission, he was IV diuresed with improvement in symptoms with a weight loss of 260 lb to 225 lb at discharge, with at least 21L net negative for the admission.   PACs/PVCs/NSVT: Asymptomatic.  Improved on Toprol-XL.  HTN: Improved on Lasix 80 mg daily, metoprolol XL 25 mg daily.  He denies any exertional chest pressure heaviness or tightness, shortness of breath.  He is seeing less edema.  He seems to be tolerating these medications well.  He has noted no orthostatic dizziness or lightheadedness.  BP Readings from Last 3 Encounters:  05/09/19 98/62  05/09/19 120/82  05/01/19 (!) 114/92   Hypothyroidism: Diagnosed > 5 years ago and is on [place] food supplement.   Lab Results  Component Value Date   TSH 11.128 (H) 04/25/2019   HLD: Goal LDL is less than 70.  Patient and spouse declined statin medication.  They will treat with diet management.  Lab Results  Component Value Date   CHOL 156 04/25/2019   HDL 25 (L) 04/25/2019   LDLCALC 113 (H) 04/25/2019   TRIG 88 04/25/2019   CHOLHDL 6.2 04/25/2019     DM: He was critically ill at that time and had elevated blood glucose in the hospital.  The spouse and patient prefers to treat with diet management.  Lab Results  Component Value Date   HGBA1C 6.7 (H) 04/25/2019    OSA: He reports he has not used CPAP.  His machine is outdated.  His equipment is outdated.  He is willing to get retested.  Hemochromatosis: FH: 2 younger brothers have [place]. They also have  heart problems from iron. Mr. Chriscoe donated blood every 3 mos because of FH. He has never seen a Hematologist. Cardiac MRI-  ordered   Lab Results  Component Value Date   FERRITIN 163 04/25/2019    Obesity: BMI 32.05: Trying to eat healthy.   Wt Readings from Last 3 Encounters:  05/09/19 229 lb 12.8 oz (104.2 kg)  05/09/19 225 lb 2 oz (102.1 kg)  05/01/19 225 lb (102.1 kg)   Gout: left foot and right hand- 1 week ago- Epsom salt, cherries x5 per day  History of elevated PSA: He has a history of male hypogonadism, and a rising baseline PSA in 2014- 2015 and was seen by Urology with reportedly no significant findings.  No family history of prostate cancer.  No recent PSA screening.  Immunizations: Tdap- needs and accepts today because he cuts his hands when working outside [employer]- declines  Colonoscopy: maybe 15 years ago- and polyps and return in 10 years. Not completed  Past Medical History:  Diagnosis Date  . Abnormal EKG   . Biventricular failure ([place])   . CAD (coronary artery disease)   . Colorectal polyps   . Gout   . Hyperlipidemia LDL goal <70   . Hypertension   . Hypothyroidism   . Kidney stone   . Pneumonia   . Testicular hypofunction   . Type 2 diabetes mellitus ([place]) 04/26/2019    Past Surgical History:  Procedure Laterality Date  . CARDIAC CATHETERIZATION    . FOOT SURGERY    . RIGHT/LEFT HEART CATH AND CORONARY ANGIOGRAPHY N/A 04/30/2019   Procedure: RIGHT/LEFT HEART CATH AND CORONARY ANGIOGRAPHY;  Surgeon: Troy Sine, MD;  Location: [place] [facility];  Service: Cardiovascular;  Laterality: N/A;    Family History  Problem Relation Age of Onset  . Heart attack Mother 9  . Pulmonary fibrosis Father   . Congestive Heart Failure Brother   . Heart attack Brother   . Heart attack Brother   . Early death Sister   . Kidney disease Paternal Grandfather   . Diabetes Sister     Social History   Socioeconomic History  . Marital status: Single    Spouse name: Not on file  . Number of children: Not on file  . Years of education: Not on file  . Highest education level: Not  on file  Occupational History  . Occupation: Chief Financial Officer  Tobacco Use  . Smoking status: Former Smoker    Quit date: 01/24/2002    Years since quitting: 17.3  . Smokeless tobacco: Never Used  Substance and Sexual Activity  . Alcohol use: No  . Drug use: No  . Sexual activity: Not on file  Other Topics Concern  . Not on file  Social History Narrative  . Not on file   Social Determinants of Health   Financial Resource Strain:   . Difficulty of Paying Living Expenses:   Food Insecurity:   . Worried About Charity fundraiser in the Last Year:   . Arboriculturist in the Last Year:   Transportation Needs:   . Film/video editor (Medical):   Marland Kitchen Lack of Transportation (Non-Medical):  Physical Activity:   . Days of Exercise per Week:   . Minutes of Exercise per Session:   Stress:   . Feeling of Stress :   Social Connections:   . Frequency of Communication with Friends and Family:   . Frequency of Social Gatherings with Friends and Family:   . Attends Religious Services:   . Active Member of Clubs or Organizations:   . Attends Archivist Meetings:   Marland Kitchen Marital Status:   Intimate Partner Violence:   . Fear of Current or Ex-Partner:   . Emotionally Abused:   Marland Kitchen Physically Abused:   . Sexually Abused:     ROS Review of Systems  Constitutional: Negative for chills and fever.  HENT: Negative for congestion and sore throat.   Eyes: Negative.   Respiratory: Negative for cough, chest tightness, shortness of breath and wheezing.   Cardiovascular: Negative for chest pain, palpitations and leg swelling.  Gastrointestinal: Negative for abdominal pain, constipation and diarrhea.  Genitourinary: Negative for difficulty urinating.  Musculoskeletal: Positive for arthralgias.       Chest tightness -cramp on right chest and twists and goes away x1-2 per day and gone in minute.  Feet pain after edema. PT ordered today by CARDIO  Neurological: Positive for weakness.  Negative for dizziness.       Bila legs- inactive, bow legged, had severe edema and gout.   Hematological: Bruises/bleeds easily.  Psychiatric/Behavioral:       No depression or anxiety concerns.     Objective:   Today's Vitals: BP 98/62 (BP Location: Left Arm, Patient Position: Sitting, Cuff Size: Normal)   Pulse 82   Temp (!) 96.6 F (35.9 C) (Temporal)   Resp 16   Ht 5\' 11"  (1.803 m)   Wt 229 lb 12.8 oz (104.2 kg)   SpO2 95%   BMI 32.05 kg/m   Physical Exam Vitals reviewed.  Constitutional:      Appearance: Normal appearance. He is obese. He is not ill-appearing.  HENT:     Head: Normocephalic and atraumatic.  Eyes:     Extraocular Movements: Extraocular movements intact.  Cardiovascular:     Rate and Rhythm: Normal rate and regular rhythm.     Heart sounds: Normal heart sounds.  Pulmonary:     Effort: Pulmonary effort is normal.     Breath sounds: Normal breath sounds.  Abdominal:     Palpations: Abdomen is soft.     Tenderness: There is no abdominal tenderness.  Musculoskeletal:     Cervical back: Normal range of motion and neck supple.     Right lower leg: Edema present.     Left lower leg: Edema present.     Comments: Slight pre tibial edema  Skin:    General: Skin is warm and dry.  Neurological:     General: No focal deficit present.     Mental Status: He is alert and oriented to person, place, and time.  Psychiatric:        Mood and Affect: Mood normal.        Behavior: Behavior normal.        Thought Content: Thought content normal.        Judgment: Judgment normal.     Assessment & Plan:   Problem List Items Addressed This Visit      Cardiovascular and Mediastinum   Essential hypertension (Chronic)    Blood pressure is running 120/82 98/62 in the office today on current therapy which is Metoprolol  XL 25 mg daily and Lasix 80 mg daily.  He is followed by cardiology for new onset CHF with ejection fraction 20 to 25% moderate CAD.  He feels well  today. Advised to monitor for  orthostatic hypotension.        Respiratory   OSA on CPAP (Chronic)    He has been ordered CPAP years ago- but not using. Willing to get retested as his machine and equipment are old. Willing to try it again.  Sleep apnea study ordered.      Relevant Orders   Ambulatory referral to Pulmonology     Endocrine   Type 2 diabetes mellitus ([place])    We discussed his hemoglobin A1c puts him in diabetic category.  We discussed his blood sugars in the hospital.  The spouse and patient did decline therapy and will treat with diet management.  If he remains in diabetic category in 45months lab recheck, they will consider medication management.      Relevant Orders   Urine Microalbumin w/creat. ratio (Completed)   Hypothyroidism - Primary    He reports a history of hypothyroidism at least 5 years ago and a brief trial of oral medication does not put it under control.  Therefore he has been on [place] medication for his thyroid.  TSH is 11.128.  He and his spouse declined any medication at this time.  He plans to go back to his herbal therapy.  We can recheck his TSH in 3 months.      Relevant Orders   T4, free (Completed)   TSH     Other   Class 2 severe obesity due to excess calories with serious comorbidity and body mass index (BMI) of 36.0 to 36.9 in adult [facility]) (Chronic)    We discussed goals for management of weight loss for his current diagnoses.  He plans to eat a healthier diet, work on weight loss.  He is already  encouraged that he lost quite a bit of fluid weight from the admission.  However, he still presents today with a BMI of 32 and is not fluid overloaded.  We discussed the strain that obesity puts on his heart, joints, and he voices understanding.  We discussed how Metformin will help his diabetes and sometimes it often helps with weight loss as well.  He declines any additional medication at this time.      Hyperlipidemia    We discussed his  elevated LDL, and goal therapy for best cardiovascular outcome.  The patient and spouse declined treatment and will treat with diet.  On repeat lipid d study in 3 months, if no improvement they will  consider medication.      Relevant Orders   Hepatic function panel (Completed)   Gout of multiple sites    He reports a long history of intermittent gout recently 1 week ago he had in the left foot and right hand.  He treats this with Epsom salts soak and eats 5 cherries per day and it resolves.  Currently, he has no gout complaints.       Other Visit Diagnoses    Encounter for HCV screening test for low risk patient       Relevant Orders   Hepatitis C Antibody (Completed)   Prostate cancer screening       History of elevated PSA       Relevant Orders   PSA, Medicare ( [place] only) (Completed)   Need for Tdap vaccination  Relevant Orders   Tdap vaccine greater than or equal to 7yo IM (Completed)      Outpatient Encounter Medications as of 05/09/2019  Medication Sig  . Ascorbic Acid (VITAMIN C) 100 MG tablet Take 500 mg by mouth daily.  Marland Kitchen aspirin EC 81 MG EC tablet Take 1 tablet (81 mg total) by mouth daily.  . cholecalciferol (VITAMIN D) 400 UNITS TABS tablet Take 400 Units by mouth 3 (three) times daily.   . furosemide (LASIX) 80 MG tablet Take 1 tablet (80 mg total) by mouth daily.  . metoprolol succinate (TOPROL-XL) 25 MG 24 hr tablet Take 1 tablet (25 mg total) by mouth daily.  . Multiple Vitamin (THERA) TABS Take 1 tablet by mouth daily.  . NON FORMULARY Take 1 capsule by mouth with breakfast, with lunch, and with evening meal. Alphalplex  . OVER THE COUNTER MEDICATION Thyroid otc medicine Simplex m  . Vitamin E 400 units TABS Take 400 Units by mouth 2 (two) times daily.    No facility-administered encounter medications on file as of 05/09/2019.   Please go to the lab today.  I put in a referral in for pulmonary consult to help.  Back onto your sleep apnea  CPAP  Based on the lab work, I will let you know levothyroxine for hypothyroidism.  You received a tetanus shot today.  Looks like you need other vaccines we can discuss that in the future.  Follow heart healthy diet.  Continue to follow with cardiology.  Below is the DASH diet you might find helpful.  This visit occurred during the SARS-CoV-2 public health emergency.  Safety protocols were in place, including screening questions prior to the visit, additional usage of staff PPE, and extensive cleaning of exam room while observing appropriate contact time as indicated for disinfecting solutions.   A total of 50 minutes was spent with patient more than half of which was spent in counseling patient on the above mentioned issues , reviewing and explaining recent labs and imaging studies done, and coordination of care.   Follow-up: Return in about 3 months (around 08/08/2019).   Denice Paradise, NP

## 2019-05-09 NOTE — Patient Instructions (Addendum)
Meet you today.  Please go to the lab today  I put in a referral in for pulmonary consult to help.  Back onto your sleep apnea CPAP  Based on the lab work, I will let you know levothyroxine for hypothyroidism.  You received a tetanus shot today.  Looks like you need other vaccines we can discuss that in the future.  Follow heart healthy diet.  Continue to follow with cardiology.  Below is the DASH diet you might find helpful.   DASH Eating Plan DASH stands for "Dietary Approaches to Stop Hypertension." The DASH eating plan is a healthy eating plan that has been shown to reduce high blood pressure (hypertension). It may also reduce your risk for type 2 diabetes, heart disease, and stroke. The DASH eating plan may also help with weight loss. What are tips for following this plan?  General guidelines  Avoid eating more than 2,300 mg (milligrams) of salt (sodium) a day. If you have hypertension, you may need to reduce your sodium intake to 1,500 mg a day.  Limit alcohol intake to no more than 1 drink a day for nonpregnant women and 2 drinks a day for men. One drink equals 12 oz of beer, 5 oz of wine, or 1 oz of hard liquor.  Work with your health care provider to maintain a healthy body weight or to lose weight. Ask what an ideal weight is for you.  Get at least 30 minutes of exercise that causes your heart to beat faster (aerobic exercise) most days of the week. Activities may include walking, swimming, or biking.  Work with your health care provider or diet and nutrition specialist (dietitian) to adjust your eating plan to your individual calorie needs. Reading food labels   Check food labels for the amount of sodium per serving. Choose foods with less than 5 percent of the Daily Value of sodium. Generally, foods with less than 300 mg of sodium per serving fit into this eating plan.  To find whole grains, look for the word "whole" as the first word in the ingredient  list. Shopping  Buy products labeled as "low-sodium" or "no salt added."  Buy fresh foods. Avoid canned foods and premade or frozen meals. Cooking  Avoid adding salt when cooking. Use salt-free seasonings or herbs instead of table salt or sea salt. Check with your health care provider or pharmacist before using salt substitutes.  Do not fry foods. Cook foods using healthy methods such as baking, boiling, grilling, and broiling instead.  Cook with heart-healthy oils, such as olive, canola, soybean, or sunflower oil. Meal planning  Eat a balanced diet that includes: ? 5 or more servings of fruits and vegetables each day. At each meal, try to fill half of your plate with fruits and vegetables. ? Up to 6-8 servings of whole grains each day. ? Less than 6 oz of lean meat, poultry, or fish each day. A 3-oz serving of meat is about the same size as a deck of cards. One egg equals 1 oz. ? 2 servings of low-fat dairy each day. ? A serving of nuts, seeds, or beans 5 times each week. ? Heart-healthy fats. Healthy fats called Omega-3 fatty acids are found in foods such as flaxseeds and coldwater fish, like sardines, salmon, and mackerel.  Limit how much you eat of the following: ? Canned or prepackaged foods. ? Food that is high in trans fat, such as fried foods. ? Food that is high in saturated fat,  such as fatty meat. ? Sweets, desserts, sugary drinks, and other foods with added sugar. ? Full-fat dairy products.  Do not salt foods before eating.  Try to eat at least 2 vegetarian meals each week.  Eat more home-cooked food and less restaurant, buffet, and fast food.  When eating at a restaurant, ask that your food be prepared with less salt or no salt, if possible. What foods are recommended? The items listed may not be a complete list. Talk with your dietitian about what dietary choices are best for you. Grains Whole-grain or whole-wheat bread. Whole-grain or whole-wheat pasta. Brown  rice. [place]. Bulgur. Whole-grain and low-sodium cereals. Pita bread. Low-fat, low-sodium crackers. Whole-wheat flour tortillas. Vegetables Fresh or frozen vegetables (raw, steamed, roasted, or grilled). Low-sodium or reduced-sodium tomato and vegetable juice. Low-sodium or reduced-sodium tomato sauce and tomato paste. Low-sodium or reduced-sodium canned vegetables. Fruits All fresh, dried, or frozen fruit. Canned fruit in natural juice (without added sugar). Meat and other protein foods Skinless chicken or [place]. Ground chicken or [place]. Pork with fat trimmed off. Fish and seafood. Egg whites. Dried beans, peas, or lentils. Unsalted nuts, nut butters, and seeds. Unsalted canned beans. Lean cuts of beef with fat trimmed off. Low-sodium, lean deli meat. Dairy Low-fat (1%) or fat-free (skim) milk. Fat-free, low-fat, or reduced-fat cheeses. Nonfat, low-sodium ricotta or cottage cheese. Low-fat or nonfat yogurt. Low-fat, low-sodium cheese. Fats and oils Soft margarine without trans fats. Vegetable oil. Low-fat, reduced-fat, or light mayonnaise and salad dressings (reduced-sodium). Canola, safflower, olive, soybean, and sunflower oils. Avocado. Seasoning and other foods Herbs. Spices. Seasoning mixes without salt. Unsalted popcorn and pretzels. Fat-free sweets. What foods are not recommended? The items listed may not be a complete list. Talk with your dietitian about what dietary choices are best for you. Grains Baked goods made with fat, such as croissants, muffins, or some breads. Dry pasta or rice meal packs. Vegetables Creamed or fried vegetables. Vegetables in a cheese sauce. Regular canned vegetables (not low-sodium or reduced-sodium). Regular canned tomato sauce and paste (not low-sodium or reduced-sodium). Regular tomato and vegetable juice (not low-sodium or reduced-sodium). Angie Fava. Olives. Fruits Canned fruit in a light or heavy syrup. Fried fruit. Fruit in cream or butter  sauce. Meat and other protein foods Fatty cuts of meat. Ribs. Fried meat. Berniece Salines. Sausage. Bologna and other processed lunch meats. Salami. Fatback. Hotdogs. Bratwurst. Salted nuts and seeds. Canned beans with added salt. Canned or smoked fish. Whole eggs or egg yolks. Chicken or [place] with skin. Dairy Whole or 2% milk, cream, and half-and-half. Whole or full-fat cream cheese. Whole-fat or sweetened yogurt. Full-fat cheese. Nondairy creamers. Whipped toppings. Processed cheese and cheese spreads. Fats and oils Butter. Stick margarine. Lard. Shortening. Ghee. Bacon fat. Tropical oils, such as coconut, palm kernel, or palm oil. Seasoning and other foods Salted popcorn and pretzels. Onion salt, garlic salt, seasoned salt, table salt, and sea salt. Worcestershire sauce. Tartar sauce. Barbecue sauce. Teriyaki sauce. Soy sauce, including reduced-sodium. Steak sauce. Canned and packaged gravies. Fish sauce. Oyster sauce. Cocktail sauce. Horseradish that you find on the shelf. Ketchup. Mustard. Meat flavorings and tenderizers. Bouillon cubes. Hot sauce and Tabasco sauce. Premade or packaged marinades. Premade or packaged taco seasonings. Relishes. Regular salad dressings. Where to find more information:  National Heart, Lung, and [place]: https://wilson-eaton.com/  American Heart Association: www.heart.org Summary  The DASH eating plan is a healthy eating plan that has been shown to reduce high blood pressure (hypertension). It may also reduce your  risk for type 2 diabetes, heart disease, and stroke.  With the DASH eating plan, you should limit salt (sodium) intake to 2,300 mg a day. If you have hypertension, you may need to reduce your sodium intake to 1,500 mg a day.  When on the DASH eating plan, aim to eat more fresh fruits and vegetables, whole grains, lean proteins, low-fat dairy, and heart-healthy fats.  Work with your health care provider or diet and nutrition specialist (dietitian) to adjust  your eating plan to your individual calorie needs. This information is not intended to replace advice given to you by your health care provider. Make sure you discuss any questions you have with your health care provider. Document Revised: 12/23/2016 Document Reviewed: 01/04/2016 Elsevier Patient Education  2020 [employer].

## 2019-05-10 ENCOUNTER — Telehealth: Payer: Self-pay

## 2019-05-10 LAB — HEPATIC FUNCTION PANEL
ALT: 36 U/L (ref 0–53)
AST: 30 U/L (ref 0–37)
Albumin: 3.7 g/dL (ref 3.5–5.2)
Alkaline Phosphatase: 53 U/L (ref 39–117)
Bilirubin, Direct: 0.1 mg/dL (ref 0.0–0.3)
Total Bilirubin: 0.7 mg/dL (ref 0.2–1.2)
Total Protein: 7.1 g/dL (ref 6.0–8.3)

## 2019-05-10 LAB — HEPATITIS C ANTIBODY
Hepatitis C Ab: NONREACTIVE
SIGNAL TO CUT-OFF: 0.02 (ref <1.00)

## 2019-05-10 LAB — BASIC METABOLIC PANEL
BUN/Creatinine Ratio: 23 (ref 10–24)
BUN: 23 mg/dL (ref 8–27)
CO2: 20 mmol/L (ref 20–29)
Calcium: 9.2 mg/dL (ref 8.6–10.2)
Chloride: 101 mmol/L (ref 96–106)
Creatinine, Ser: 1 mg/dL (ref 0.76–1.27)
GFR calc Af Amer: 88 mL/min/{1.73_m2} (ref >59)
GFR calc non Af Amer: 76 mL/min/{1.73_m2} (ref >59)
Glucose: 90 mg/dL (ref 65–99)
Potassium: 4.2 mmol/L (ref 3.5–5.2)
Sodium: 140 mmol/L (ref 134–144)

## 2019-05-10 LAB — PSA, MEDICARE: PSA: 0.57 ng/ml (ref 0.10–4.00)

## 2019-05-10 LAB — MICROALBUMIN / CREATININE URINE RATIO
Creatinine,U: 80.6 mg/dL
Microalb Creat Ratio: 1.1 mg/g (ref 0.0–30.0)
Microalb, Ur: 0.9 mg/dL (ref 0.0–1.9)

## 2019-05-10 LAB — T4, FREE: Free T4: 0.69 ng/dL (ref 0.60–1.60)

## 2019-05-10 NOTE — Telephone Encounter (Signed)
-----   Message from Rise Mu, [place] sent at 05/10/2019  8:30 AM EDT ----- Renal function continues to improve and is now normal.  Potassium is at goal.  No changes in medications at this time.   -Keep follow up with Dr. Saunders Revel next month.

## 2019-05-10 NOTE — Telephone Encounter (Signed)
Call to patient to review labs.    Pt verbalized understanding and has no further questions at this time.    Advised pt to call for any further questions or concerns.  No further orders.   Pt requested labs mailed to house. Request completed.

## 2019-05-11 DIAGNOSIS — M109 Gout, unspecified: Secondary | ICD-10-CM | POA: Insufficient documentation

## 2019-05-11 DIAGNOSIS — E038 Other specified hypothyroidism: Secondary | ICD-10-CM | POA: Insufficient documentation

## 2019-05-11 DIAGNOSIS — E039 Hypothyroidism, unspecified: Secondary | ICD-10-CM | POA: Insufficient documentation

## 2019-05-11 NOTE — Assessment & Plan Note (Signed)
Blood pressure is running 120/82 98/62 in the office today on current therapy which is Metoprolol XL 25 mg daily and Lasix 80 mg daily.  He is followed by cardiology for new onset CHF with ejection fraction 20 to 25% moderate CAD.  He feels well today. Advised to monitor for  orthostatic hypotension.

## 2019-05-11 NOTE — Assessment & Plan Note (Signed)
We discussed his elevated LDL, and goal therapy for best cardiovascular outcome.  The patient and spouse declined treatment and will treat with diet.  On repeat lipid d study in 3 months, if no improvement they will  consider medication.

## 2019-05-11 NOTE — Assessment & Plan Note (Signed)
We discussed goals for management of weight loss for his current diagnoses.  He plans to eat a healthier diet, work on weight loss.  He is already  encouraged that he lost quite a bit of fluid weight from the admission.  However, he still presents today with a BMI of 32 and is not fluid overloaded.  We discussed the strain that obesity puts on his heart, joints, and he voices understanding.  We discussed how Metformin will help his diabetes and sometimes it often helps with weight loss as well.  He declines any additional medication at this time.

## 2019-05-11 NOTE — Assessment & Plan Note (Addendum)
We discussed his hemoglobin A1c puts him in diabetic category.  We discussed his blood sugars in the hospital.  The spouse and patient did decline therapy and will treat with diet management.  If he remains in diabetic category in 42months lab recheck, they will consider medication management.

## 2019-05-11 NOTE — Assessment & Plan Note (Signed)
He has been ordered CPAP years ago- but not using. Willing to get retested as his machine and equipment are old. Willing to try it again.  Sleep apnea study ordered.

## 2019-05-11 NOTE — Assessment & Plan Note (Signed)
He reports a history of hypothyroidism at least 5 years ago and a brief trial of oral medication does not put it under control.  Therefore he has been on [place] medication for his thyroid.  TSH is 11.128.  He and his spouse declined any medication at this time.  He plans to go back to his herbal therapy.  We can recheck his TSH in 3 months.

## 2019-05-11 NOTE — Assessment & Plan Note (Signed)
He reports a long history of intermittent gout recently 1 week ago he had in the left foot and right hand.  He treats this with Epsom salts soak and eats 5 cherries per day and it resolves.  Currently, he has no gout complaints.

## 2019-05-13 ENCOUNTER — Other Ambulatory Visit: Payer: Self-pay | Admitting: *Deleted

## 2019-05-13 DIAGNOSIS — I5040 Unspecified combined systolic (congestive) and diastolic (congestive) heart failure: Secondary | ICD-10-CM

## 2019-05-14 ENCOUNTER — Telehealth: Payer: Self-pay | Admitting: Nurse Practitioner

## 2019-05-14 DIAGNOSIS — E039 Hypothyroidism, unspecified: Secondary | ICD-10-CM

## 2019-05-14 DIAGNOSIS — R7989 Other specified abnormal findings of blood chemistry: Secondary | ICD-10-CM

## 2019-05-14 NOTE — Telephone Encounter (Signed)
FYI

## 2019-05-14 NOTE — Telephone Encounter (Signed)
Patient's wife called to let Maudie Mercury know that Roetta Sessions is covered by [employer]. Also checking on referrals. Please call wife, [phone].

## 2019-05-15 ENCOUNTER — Telehealth: Payer: Self-pay | Admitting: *Deleted

## 2019-05-15 ENCOUNTER — Encounter: Payer: Self-pay | Admitting: *Deleted

## 2019-05-15 NOTE — Telephone Encounter (Signed)
Left message that referral can take 7-10 business days for referral to be processed.

## 2019-05-15 NOTE — Telephone Encounter (Signed)
Let her know I placed the PULM referral. Have they received a call from them? If not, please inquire with Rasheeda.

## 2019-05-15 NOTE — Telephone Encounter (Signed)
Spoke with patient regarding appointment scheduled for Cardiac MRI Friday 06/14/19 at 9:00 am---arrival time is 8:15 am --1st floor radiology at [facility].  Will mail information the patient

## 2019-05-16 NOTE — Telephone Encounter (Signed)
I spoke with Blake West at [facility] pulmonary referral has been received pt is not scheduled as of yet. This note was made on 05/14/2019.

## 2019-05-22 ENCOUNTER — Telehealth: Payer: Self-pay | Admitting: Nurse Practitioner

## 2019-05-22 DIAGNOSIS — R5381 Other malaise: Secondary | ICD-10-CM

## 2019-05-22 NOTE — Telephone Encounter (Signed)
Pt's wife called and wanted to know if there had been a referral placed for hematology? Please advise

## 2019-05-22 NOTE — Telephone Encounter (Signed)
Wife needs him to see PT for gait strengthening.   Hematology consult -for elevated HGB Cardiac MRI planned for next month.   Appt for pulmonary arranged.

## 2019-05-22 NOTE — Telephone Encounter (Signed)
Patient calling for hematology referral. I do not see where one has been placed yet. Would you like patient to be referred to hematology?

## 2019-05-22 NOTE — Telephone Encounter (Signed)
1. I have sent in a PT referral to help him with deconditioning and strength/balance.   2. What is his BP, HR and weight now? How much furosemide is he taking? Is he getting lightheaded or dizzy when standing?  If yes, then this needs corrected as PT will not be helpful.   3. He and his wife request a Hematology referral for Hereditary Hemochromatosis: FH: 2 younger brothers have [place]. They also have  heart problems from iron. Mr. Mahon donated blood every 3 mos because of FH.  He has never seen a Hematologit or had HH genetic testing. A cardiac MRI- ordered. His ferritin was normal and Hgb normal after admission- but he had been donating blood prior.  Recent Hgb 17.8, Hct 56.5 on 05/01/2019.   Plan: I will consult with Hematology about this request as we may be able to get the initial screening labs done here while waiting for an appt.

## 2019-05-22 NOTE — Telephone Encounter (Signed)
Pt's wife called back again and would like a phone call asap about husband's care. PT, hematology, and general info.

## 2019-05-23 NOTE — Telephone Encounter (Signed)
The Hematologist recommends a Hereditary Hemochromatosis lab test and if positive, will see Blake West.   Ask him to please come in for lab work when he is able.  I can also add the TSH as it will be 4 weeks form his last test on 04/25/19.

## 2019-05-23 NOTE — Telephone Encounter (Signed)
Yes, I saw the note.  He does not sound orthostatic.

## 2019-05-23 NOTE — Telephone Encounter (Signed)
Pt's wife would like a call back about pulmonary and hematology. Please advise. She states that he has an appt with Dr. Ander Slade in [place] on 06/04/19

## 2019-05-23 NOTE — Telephone Encounter (Addendum)
Spoke with patient this morning and he was not at home to be able to take his BP and had not yet today but states he does take it daily. States BP is usually around 130/70 and weight is 225lbs. Patient states he could call back later with BP reading if needs to; he was going to the [facility] and was unsure of when he would be back home. Patient states he does not get dizzy or lightheaded when standing. Also patient states he takes furosemide 80mg  tabs QD.

## 2019-05-23 NOTE — Telephone Encounter (Signed)
Were you able to see the message below after I talked to patient this morning? I wanted to be sure before I called the wife back since she called a few minutes ago in case there was something else that needed to be discuss with them.

## 2019-05-24 ENCOUNTER — Telehealth: Payer: Self-pay | Admitting: *Deleted

## 2019-05-24 ENCOUNTER — Other Ambulatory Visit: Payer: Self-pay

## 2019-05-24 ENCOUNTER — Other Ambulatory Visit: Payer: PPO

## 2019-05-24 ENCOUNTER — Other Ambulatory Visit: Payer: Self-pay | Admitting: *Deleted

## 2019-05-24 DIAGNOSIS — E039 Hypothyroidism, unspecified: Secondary | ICD-10-CM

## 2019-05-24 LAB — TSH: TSH: 12.28 u[IU]/mL — ABNORMAL HIGH (ref 0.35–4.50)

## 2019-05-24 NOTE — Telephone Encounter (Signed)
The order was corrected by Latoya.

## 2019-05-24 NOTE — Progress Notes (Signed)
Opened in error

## 2019-05-24 NOTE — Telephone Encounter (Signed)
Patient is scheduled to come in today for labwork

## 2019-05-24 NOTE — Telephone Encounter (Signed)
Pt has lab appt this afternoon at 2:30. The "HFE" test can not be drawn here because we do not have a test code for it. It only comes up under "Other" location.

## 2019-05-27 NOTE — Telephone Encounter (Signed)
I placed a ENDOCRINOLOGY referral for him. Urgent status given his complex medical hx and difficulty with fatigue and need to proceed with his cardiac rehab.

## 2019-06-04 ENCOUNTER — Encounter: Payer: Self-pay | Admitting: Pulmonary Disease

## 2019-06-04 ENCOUNTER — Ambulatory Visit: Payer: PPO | Admitting: Pulmonary Disease

## 2019-06-04 ENCOUNTER — Other Ambulatory Visit: Payer: Self-pay

## 2019-06-04 VITALS — BP 114/68 | HR 52 | Temp 97.6°F | Ht 71.0 in | Wt 232.6 lb

## 2019-06-04 DIAGNOSIS — R0602 Shortness of breath: Secondary | ICD-10-CM

## 2019-06-04 DIAGNOSIS — G4733 Obstructive sleep apnea (adult) (pediatric): Secondary | ICD-10-CM | POA: Diagnosis not present

## 2019-06-04 NOTE — Patient Instructions (Signed)
History of obstructive sleep apnea Recent diagnosis of heart failure  Obtain in lab split-night study  Pulmonary function test  We will follow-up with you in about 8 weeks  Call with significant concerns

## 2019-06-04 NOTE — Progress Notes (Signed)
Blake West    [id number]    [birth year]  Primary Care Physician:Mills, Janalyn Harder, NP  Referring Physician: Marval Regal, NP [address] [place],  [place] [address]  Chief complaint:   Patient is being seen for sleep apnea  HPI:  Diagnosed with obstructive sleep apnea many years ago Used CPAP infrequently  Was recently treated for congestive heart failure, biventricular heart failure  Spouse states that he has not been snoring Usually goes to bed between 9 PM and 2 AM Falls asleep in about 10 minutes About 4 awakenings Final wake up time about 630 Weight has been stable  Has not been snoring No witnessed apneas  Did not have any specific problem with CPAP in the past just never got used to it  Smoked significantly in the past quit in 2004  He was very athletic when he was younger  Outpatient Encounter Medications as of 06/04/2019  Medication Sig  . Ascorbic Acid (VITAMIN C) 100 MG tablet Take 500 mg by mouth daily.  Marland Kitchen aspirin EC 81 MG EC tablet Take 1 tablet (81 mg total) by mouth daily.  . cholecalciferol (VITAMIN D) 400 UNITS TABS tablet Take 400 Units by mouth 3 (three) times daily.   . furosemide (LASIX) 80 MG tablet Take 1 tablet (80 mg total) by mouth daily.  . metoprolol succinate (TOPROL-XL) 25 MG 24 hr tablet Take 1 tablet (25 mg total) by mouth daily.  . Multiple Vitamin (THERA) TABS Take 1 tablet by mouth daily.  . NON FORMULARY Take 1 capsule by mouth with breakfast, with lunch, and with evening meal. Alphalplex  . OVER THE COUNTER MEDICATION Thyroid otc medicine Simplex m  . Vitamin E 400 units TABS Take 400 Units by mouth 2 (two) times daily.    No facility-administered encounter medications on file as of 06/04/2019.    Allergies as of 06/04/2019 - Review Complete 06/04/2019  Allergen Reaction Noted  . Codeine  07/12/2013    Past Medical History:  Diagnosis Date  . Abnormal EKG   . Biventricular failure ([place])    . CAD (coronary artery disease)   . Colorectal polyps   . Gout   . Hyperlipidemia LDL goal <70   . Hypertension   . Hypothyroidism   . Kidney stone   . Pneumonia   . Testicular hypofunction   . Type 2 diabetes mellitus ([place]) 04/26/2019    Past Surgical History:  Procedure Laterality Date  . CARDIAC CATHETERIZATION    . FOOT SURGERY    . RIGHT/LEFT HEART CATH AND CORONARY ANGIOGRAPHY N/A 04/30/2019   Procedure: RIGHT/LEFT HEART CATH AND CORONARY ANGIOGRAPHY;  Surgeon: Troy Sine, MD;  Location: [facility];  Service: Cardiovascular;  Laterality: N/A;    Family History  Problem Relation Age of Onset  . Heart attack Mother 46  . Pulmonary fibrosis Father   . Congestive Heart Failure Brother   . Heart attack Brother   . Heart attack Brother   . Early death Sister   . Kidney disease Paternal Grandfather   . Diabetes Sister     Social History   Socioeconomic History  . Marital status: Single    Spouse name: Not on file  . Number of children: Not on file  . Years of education: Not on file  . Highest education level: Not on file  Occupational History  . Occupation: Chief Financial Officer  Tobacco Use  . Smoking status: Former  Smoker    Packs/day: 1.00    Years: 30.00    Pack years: 30.00    Types: Cigarettes    Quit date: 01/24/2002    Years since quitting: 17.3  . Smokeless tobacco: Never Used  Substance and Sexual Activity  . Alcohol use: No  . Drug use: No  . Sexual activity: Not on file  Other Topics Concern  . Not on file  Social History Narrative  . Not on file   Social Determinants of Health   Financial Resource Strain:   . Difficulty of Paying Living Expenses:   Food Insecurity:   . Worried About Charity fundraiser in the Last Year:   . Arboriculturist in the Last Year:   Transportation Needs:   . Film/video editor (Medical):   Marland Kitchen Lack of Transportation (Non-Medical):   Physical Activity:   . Days of Exercise per Week:   . Minutes of  Exercise per Session:   Stress:   . Feeling of Stress :   Social Connections:   . Frequency of Communication with Friends and Family:   . Frequency of Social Gatherings with Friends and Family:   . Attends Religious Services:   . Active Member of Clubs or Organizations:   . Attends Archivist Meetings:   Marland Kitchen Marital Status:   Intimate Partner Violence:   . Fear of Current or Ex-Partner:   . Emotionally Abused:   Marland Kitchen Physically Abused:   . Sexually Abused:     Review of Systems  HENT: Negative.   Respiratory: Positive for apnea.   Psychiatric/Behavioral: Positive for sleep disturbance.    Vitals:   06/04/19 1422  BP: 114/68  Pulse: (!) 52  Temp: 97.6 F (36.4 C)  SpO2: 95%     Physical Exam  Constitutional: He appears well-developed.  Obese  HENT:  Head: Normocephalic.  Mallampati 4, crowded oropharynx  Eyes: Pupils are equal, round, and reactive to light.  Neck: No tracheal deviation present. No thyromegaly present.  Cardiovascular: Normal rate and regular rhythm.  Pulmonary/Chest: Effort normal and breath sounds normal. No respiratory distress. He has no wheezes. He has no rales. He exhibits no tenderness.  Musculoskeletal:        General: No edema. Normal range of motion.  Neurological: He is alert.  Skin: Skin is warm.  Psychiatric: He has a normal mood and affect.   Data Reviewed: Recent echocardiogram shows ejection fraction of 20 to 25%, biventricular heart failure  Assessment:   Possible obstructive sleep apnea Possible central sleep apnea  Past history of obstructive sleep apnea  Significant smoking history Possibility of obstructive lung disease  Denies significant shortness of breath at present  Pathophysiology of sleep disordered breathing discussed  Plan/Recommendations: We will schedule the patient for an in lab polysomnogram  Obtain pulmonary function testing  Follow-up in about 2 months  Intermittent monitoring of oxygen  saturations  Call with significant concerns   Sherrilyn Rist MD [facility] 06/04/2019, 3:07 PM  CC: Marval Regal, NP

## 2019-06-06 LAB — HEMOCHROMATOSIS DNA-PCR(C282Y,H63D)

## 2019-06-11 ENCOUNTER — Telehealth: Payer: Self-pay | Admitting: Nurse Practitioner

## 2019-06-11 NOTE — Telephone Encounter (Signed)
I LMOM on phone to call the clinic for lab results.   He tested NEGATIVE for hereditary hemochromatosis genetic marker. The Hematologist wanted to see him if this test was positive.   His cardiac MRI is scheduled for 06/14/2019. If this shows iron stores in heart- he will need to see Hematology. If it is negative, he will not need to see Hematology.  He has a f/up with me in July, [place] to move up that appt to June if needs to discuss this with me further.

## 2019-06-12 ENCOUNTER — Other Ambulatory Visit: Payer: Self-pay

## 2019-06-12 ENCOUNTER — Ambulatory Visit: Payer: PPO | Attending: Nurse Practitioner

## 2019-06-12 VITALS — BP 133/84 | HR 99

## 2019-06-12 DIAGNOSIS — M6281 Muscle weakness (generalized): Secondary | ICD-10-CM | POA: Diagnosis not present

## 2019-06-12 DIAGNOSIS — R2681 Unsteadiness on feet: Secondary | ICD-10-CM | POA: Diagnosis not present

## 2019-06-12 NOTE — Therapy (Signed)
[place] MAIN [facility] SERVICES [address] [place], [place], [address] Phone: [phone]   Fax:  [phone]  Physical Therapy Evaluation  Patient Details  Name: Blake West MRN: [id number] Date of Birth: 02/06/1950 Referring Provider (PT): Denice Paradise   Encounter Date: 06/12/2019  PT End of Session - 06/12/19 1051    Visit Number  1    Number of Visits  25    Date for PT Re-Evaluation  09/04/19    Authorization Type  eval: 06/12/19    PT Start Time  1100    PT Stop Time  1200    PT Time Calculation (min)  60 min    Equipment Utilized During Treatment  Gait belt    Activity Tolerance  Patient tolerated treatment well    Behavior During Therapy  WFL for tasks assessed/performed       Past Medical History:  Diagnosis Date  . Abnormal EKG   . Biventricular failure ([place])   . CAD (coronary artery disease)   . Colorectal polyps   . Gout   . Hyperlipidemia LDL goal <70   . Hypertension   . Hypothyroidism   . Kidney stone   . Pneumonia   . Testicular hypofunction   . Type 2 diabetes mellitus ([place]) 04/26/2019    Past Surgical History:  Procedure Laterality Date  . CARDIAC CATHETERIZATION    . FOOT SURGERY    . RIGHT/LEFT HEART CATH AND CORONARY ANGIOGRAPHY N/A 04/30/2019   Procedure: RIGHT/LEFT HEART CATH AND CORONARY ANGIOGRAPHY;  Surgeon: Troy Sine, MD;  Location: [place] [facility];  Service: Cardiovascular;  Laterality: N/A;    Vitals:   06/12/19 1105  BP: 133/84  Pulse: 99  SpO2: 95%     Subjective Assessment - 06/12/19 1037    Subjective  Deconditioning    Patient is accompained by:  Family member   Wife stays in waiting room   Pertinent History  Blake West is a 69 y.o. male with history of essential hypertension, hypothyroidism, hemochromatosis, gout, sleep apnea ordered CPAP but not using, obesity, hyperlipidemia, newly diagnosed diabetes mellitus, who reports 2 weeks of deconditioning/DOE followed by an  [facility] from 04/25/19 to 05/01/19 for new onset CHF with newly diagnosed cardiomyopathy felt to be nonischemic with moderate CAD. He does not see physicians secondary to his wife is Native Optometrist and a Theatre stage manager and they prefer holistic medicine approach. Cardiology recommended starting ACE inhibitor/ARB/MRA/ARNI. He recommended referral to cardiac lab, schedule cardiac MRI, check BMP and CHF education. Echo on 04/26/2019, showed a new cardiomyopathy with an EF of 20-25%. Diagnostic cath showed moderate CAD, with recommendation to continue medical management and risk factor modification.  He is on aspirin and metoprolol.  He has declined statin therapy. Former smoker quit date 2004 never used smokeless tobacco. During admission he was IV diuresed with improvement in symptoms with a weight loss of 260 lb to 225 lb at discharge, with at least 21L net negative for the admission. He was referred for physical therapy due to physical deconditioning. Pt reports that he has had bilateral knee pain for a while as well as difficulty with his balance. He complains of bilateral lower extremity tingling in stocking distribution up to just above the ankles.    Limitations  Walking    Patient Stated Goals  "To walk again, maybe even run"    Currently in Pain?  Yes    Pain Score  5  Pain Location  Knee    Pain Orientation  Left    Pain Descriptors / Indicators  Other (Comment)   "weak"   Pain Type  Chronic pain    Pain Onset  More than a month ago    Pain Frequency  Intermittent    Multiple Pain Sites  --         [facility] PT Assessment - 06/12/19 1038      Assessment   Medical Diagnosis  Physical deconditioning    Referring Provider (PT)  Denice Paradise    Onset Date/Surgical Date  04/11/19    Hand Dominance  Right    Next MD Visit  Upcoming visit with cardiology    Prior Therapy  No      Precautions   Precautions  Fall      Restrictions   Weight Bearing Restrictions  No      Balance Screen    Has the patient fallen in the past 6 months  Yes    How many times?  2    Has the patient had a decrease in activity level because of a fear of falling?   Yes    Is the patient reluctant to leave their home because of a fear of falling?   No      Home Environment   Living Environment  Private residence    Living Arrangements  Spouse/significant other    Available Help at Discharge  Family    Type of Page to enter    Entrance Stairs-Number of Steps  3    Entrance Stairs-Rails  None    Home Layout  Two level;Bed/bath upstairs    Alternate Level Stairs-Number of Steps  13    Alternate Level Stairs-Rails  [place] - 2 wheels;Walker - 4 wheels;Bedside commode;Shower seat      Prior Function   Level of Independence  Independent    Vocation  Part time employment    Arts administrator    Leisure  Fishing      Cognition   Overall Cognitive Status  Within Functional Limits for tasks assessed      6 Minute Walk- Baseline   6 Minute Walk- Baseline  yes    BP (mmHg)  133/84    HR (bpm)  99    02 Sat (%RA)  95 %    Modified Borg Scale for Dyspnea  0- Nothing at all    Perceived Rate of Exertion (Borg)  6-      6 Minute walk- Post Test   6 Minute Walk Post Test  yes    BP (mmHg)  127/75    HR (bpm)  112    02 Sat (%RA)  92 %    Modified Borg Scale for Dyspnea  2- Mild shortness of breath    Perceived Rate of Exertion (Borg)  9- very light      6 minute walk test results    Aerobic Endurance Distance Walked  605    Endurance additional comments  No assistive device      Standardized Balance Assessment   Standardized Balance Assessment  Berg Balance Test      Berg Balance Test   Sit to Stand  Able to stand without using hands and stabilize independently    Standing Unsupported  Able to stand safely 2 minutes    Sitting with Back Unsupported but  Feet Supported on Floor or Stool  Able to sit safely and  securely 2 minutes    Stand to Sit  Sits safely with minimal use of hands    Transfers  Able to transfer safely, minor use of hands    Standing Unsupported with Eyes Closed  Able to stand 10 seconds safely    Standing Unsupported with Feet Together  Able to place feet together independently and stand 1 minute safely    From Standing, Reach Forward with Outstretched Arm  Can reach forward >12 cm safely (5")    From Standing Position, Pick up Object from Floor  Able to pick up shoe, needs supervision    From Standing Position, Turn to Look Behind Over each Shoulder  Looks behind from both sides and weight shifts well    Turn 360 Degrees  Able to turn 360 degrees safely but slowly    Standing Unsupported, Alternately Place Feet on Step/Stool  Able to stand independently and safely and complete 8 steps in 20 seconds    Standing Unsupported, One Foot in Front  Able to place foot tandem independently and hold 30 seconds    Standing on One Leg  Able to lift leg independently and hold 5-10 seconds    Total Score  51         SUBJECTIVE Chief complaint: Deconditioning Onset: DANG MATHISON is a 69 y.o.malewith history of essential hypertension, hypothyroidism, hemochromatosis, gout, sleep apnea ordered CPAP but not using, obesity, hyperlipidemia, newly diagnosed diabetes mellitus, who reports 2 weeks of deconditioning/DOE followed [facility] from 04/25/19 to 05/01/19 for new onset CHF with newly diagnosed cardiomyopathy felt to be nonischemic with moderate CAD. He does not see physicians secondary to his wife is Native Optometrist and a Theatre stage manager and they prefer holistic medicine approach. Cardiology recommended starting ACE inhibitor/ARB/MRA/ARNI. He recommended referral to cardiac lab, schedule cardiac MRI, check BMP and CHF education. Echo on 04/26/2019, showed a new cardiomyopathy with an EF of 20-25%. Diagnostic cath showed moderate CAD, with recommendation to continue medical management and  risk factor modification.  He is on aspirin and metoprolol.  He has declined statin therapy. Former smoker quit date 2004 never used smokeless tobacco. During admission he was IV diuresed with improvement in symptoms with a weight loss of 260 lb to 225 lb at discharge, with at least 21L net negative for the admission. He was referred for physical therapy due to physical deconditioning. Pt reports that he has had bilateral knee pain for a while as well as difficulty with his balance. He complains of bilateral lower extremity tingling in stocking distribution up to just above the ankles. Falls in the last 6 months: Two, once under the house and once off a ladder. No significant injuries with either fall Prior history of physical therapy: None Follow-up appointment with MD: Upcoming cardiology appt Red flags (bowel/bladder changes, saddle paresthesia, personal history of cancer, chills/fever, night sweats, unrelenting pain) Negative   OBJECTIVE  MUSCULOSKELETAL: Tremor: Absent Bulk: Normal Tone: Normal, no clonus  Posture Notable bilateral genu varus in standing. Increased truncal adiposity  Gait Trendelenburg gait with bilateral genu varus and toe in. Gait speed is below necessary speed for community ambulation.  Strength R/L 4+/4+ Hip flexion Hip abduction/adduction active and strong in sitting, not tested in sidelying 5/5 Knee extension 5/5 Knee flexion Ankle Plantarflexion active contraction bilaterally; 4+/4+ Ankle Dorsiflexion Otherwise strength assessed functioanlly   NEUROLOGICAL:  Mental Status Patient is oriented to person, place  and time.  Recent memory is intact.  Remote memory is intact.  Attention span and concentration are intact.  Expressive speech is intact.  Patient's fund of knowledge is within normal limits for educational level.  Cranial Nerves Deferred  Sensation Deferred  Reflexes Deferred  Coordination/Cerebellar Deferred   FUNCTIONAL OUTCOME  MEASURES   Results Comments  BERG 51/56 Fall risk, in need of intervention  TUG 15.8 seconds In need of intervention  5TSTS 19.7 seconds In need of intervention  6 Minute Walk Test 605' No assistive device, CGA, SpO2 91/92%  10 Meter Gait Speed Self-selected: 14.6s = 0.68 m/s; Fastest: 12.6s = 0.79 m/s Below normative values for full community ambulation  ABC Scale 70.625% WNL  FOTO 60 Predicted improvement to 73     POSTURAL CONTROL TESTS   Modified Clinical Test of Sensory Interaction for Balance    (CTSIB): Deferred  OCULOMOTOR / VESTIBULAR TESTING: Deferred             Objective measurements completed on examination: See above findings.              PT Education - 06/12/19 1050    Education Details  Plan of care    Person(s) Educated  Patient    Methods  Explanation    Comprehension  Verbalized understanding       PT Short Term Goals - 06/12/19 1140      PT SHORT TERM GOAL #1   Title  Pt will be independent with HEP in order to improve strength and balance in order to decrease fall risk and improve function at home and work.    Time  6    Period  Weeks    Status  New    Target Date  07/24/19        PT Long Term Goals - 06/12/19 1140      PT LONG TERM GOAL #1   Title  Pt will improve BERG by at least 3 points in order to demonstrate clinically significant improvement in balance.    Baseline  06/12/19: 51/56    Time  12    Period  Weeks    Status  New    Target Date  06/12/19      PT LONG TERM GOAL #2   Title  Pt will decrease 5TSTS by at least 3 seconds in order to demonstrate clinically significant improvement in LE strength.    Baseline  06/12/19: 19.7s    Time  12    Period  Weeks    Status  New    Target Date  09/04/19      PT LONG TERM GOAL #3   Title  Pt will decrease TUG to below 14 seconds/decrease in order to demonstrate decreased fall risk.    Baseline  06/12/19: 15.8s    Time  12    Period  Weeks    Status  New     Target Date  09/04/19      PT LONG TERM GOAL #4   Title  Pt will increase 6MWT by at least 24m (191ft) in order to demonstrate clinically significant improvement in cardiopulmonary endurance and community ambulation    Baseline  06/12/19: 605' no asssitive device, CGA    Time  12    Period  Weeks    Status  New    Target Date  09/04/19             Plan - 06/12/19 1051    Clinical  Impression Statement  Pt is a pleasant 69 year-old male referred for deconditioning following hospital admission in early April for new onset CHF. PT examination reveals deficits in strength and balance as evidenced by BERG of 51/56, TUG of 15.8s, and 5TSTS of 19.7s. His 6MWT is 23' which is below normative values for age/gender. Ten meter gait speed is below cut-off for community ambulation. Pt presents with deficits in strength, gait and balance. He will benefit from skilled PT services to address deficits in balance and decrease risk for future falls.    Personal Factors and Comorbidities  Age;Comorbidity 3+    Comorbidities  CHF, CAD, OA, HTN, DM    Examination-Activity Limitations  Locomotion Level;Bend;Stairs;Squat    Examination-Participation Restrictions  Community Activity;Yard Work    Merchant navy officer  Evolving/Moderate complexity    Clinical Decision Making  Moderate    Rehab Potential  Good    PT Frequency  2x / week    PT Duration  12 weeks    PT Treatment/Interventions  ADLs/Self Care Home Management;Aquatic Therapy;Canalith Repostioning;Cryotherapy;Electrical Stimulation;Iontophoresis 4mg /ml Dexamethasone;Moist Heat;Traction;Ultrasound;Contrast Bath;DME Instruction;Gait training;Stair training;Functional mobility training;Therapeutic activities;Therapeutic exercise;Balance training;Neuromuscular re-education;Patient/family education;Manual techniques;Passive range of motion;Dry needling;Energy conservation;Vestibular;Joint Manipulations    PT Next Visit Plan  Initiate HEP    PT  Home Exercise Plan  None currently    Consulted and Agree with Plan of Care  Patient       Patient will benefit from skilled therapeutic intervention in order to improve the following deficits and impairments:  Decreased strength, Abnormal gait, Decreased balance, Difficulty walking  Visit Diagnosis: Muscle weakness (generalized)  Unsteadiness on feet     Problem List Patient Active Problem List   Diagnosis Date Noted  . Hypothyroidism 05/11/2019  . Gout of multiple sites 05/11/2019  . Type 2 diabetes mellitus ([place]) 04/26/2019  . Hyperlipidemia 04/26/2019  . Acute congestive heart failure ([place]) 04/25/2019  . Essential hypertension 04/25/2019  . OSA on CPAP 04/25/2019  . Class 2 severe obesity due to excess calories with serious comorbidity and body mass index (BMI) of 36.0 to 36.9 in adult [facility]) 04/25/2019   Phillips Grout PT, DPT, GCS  Tullio Chausse 06/12/2019, 1:39 PM  [place] MAIN [facility] SERVICES [place], [place], [address] Phone: [phone]   Fax:  [phone]  Name: TAJH LIVSEY MRN: [id number] Date of Birth: [birth year]

## 2019-06-12 NOTE — Telephone Encounter (Signed)
Patient and wife aware of below message. They requested results be sent to them vis mail since they do not have a computer. Results sent.

## 2019-06-13 ENCOUNTER — Telehealth (HOSPITAL_COMMUNITY): Payer: Self-pay | Admitting: *Deleted

## 2019-06-13 NOTE — Telephone Encounter (Signed)
Attempted to call patient regarding upcoming cardiac MRI appointment. Left message on voicemail with name and callback number  Merle Tai RN Navigator Cardiac Imaging [facility] [phone] Office [phone] Cell  

## 2019-06-14 ENCOUNTER — Other Ambulatory Visit: Payer: Self-pay

## 2019-06-14 ENCOUNTER — Ambulatory Visit (HOSPITAL_COMMUNITY)
Admission: RE | Admit: 2019-06-14 | Discharge: 2019-06-14 | Disposition: A | Discharge status: Home / Self Care | Payer: PPO | Source: Ambulatory Visit | Attending: Physician Assistant | Admitting: Physician Assistant

## 2019-06-14 ENCOUNTER — Other Ambulatory Visit: Payer: Self-pay | Admitting: Physician Assistant

## 2019-06-14 DIAGNOSIS — I5082 Biventricular heart failure: Secondary | ICD-10-CM | POA: Diagnosis not present

## 2019-06-14 MED ORDER — GADOBUTROL 1 MMOL/ML IV SOLN
10.0000 mL | Freq: Once | INTRAVENOUS | Status: AC | PRN
  Administered 2019-06-14: 10 mL via INTRAVENOUS

## 2019-06-17 ENCOUNTER — Other Ambulatory Visit: Payer: Self-pay

## 2019-06-17 ENCOUNTER — Other Ambulatory Visit (HOSPITAL_COMMUNITY): Payer: PPO

## 2019-06-17 ENCOUNTER — Ambulatory Visit: Payer: PPO

## 2019-06-17 VITALS — BP 122/83 | HR 87

## 2019-06-17 DIAGNOSIS — M6281 Muscle weakness (generalized): Secondary | ICD-10-CM | POA: Diagnosis not present

## 2019-06-17 DIAGNOSIS — R2681 Unsteadiness on feet: Secondary | ICD-10-CM

## 2019-06-17 NOTE — Patient Instructions (Addendum)
Access Code: [email or web address] URL: [email or web address] Date: 06/17/2019 Prepared by: Roxana Hires  Exercises Seated March with Resistance - 2 x daily - 7 x weekly - 2 sets - 10 reps - 3s hold Seated Hip Abduction with Resistance - 2 x daily - 7 x weekly - 2 sets - 10 reps - 3s hold Mini Squat with Counter Support - 2 x daily - 7 x weekly - 2 sets - 10 reps

## 2019-06-17 NOTE — Therapy (Addendum)
[name] [facility] SERVICES [address] [place], [place], [address] Phone: [phone]   Fax:  [phone]  Physical Therapy Treatment  Patient Details  Name: Blake West MRN: [id number] Date of Birth: March 06, 1950 Referring Provider (PT): Denice Paradise   Encounter Date: 06/17/2019  PT End of Session - 06/17/19 1306    Visit Number  2    Number of Visits  25    Date for PT Re-Evaluation  09/04/19    Authorization Type  eval: 06/12/19    PT Start Time  1300    PT Stop Time  1345    PT Time Calculation (min)  45 min    Equipment Utilized During Treatment  Gait belt    Activity Tolerance  Patient tolerated treatment well    Behavior During Therapy  [facility] for tasks assessed/performed       Past Medical History:  Diagnosis Date  . Abnormal EKG   . Biventricular failure ([place])   . CAD (coronary artery disease)   . Colorectal polyps   . Gout   . Hyperlipidemia LDL goal <70   . Hypertension   . Hypothyroidism   . Kidney stone   . Pneumonia   . Testicular hypofunction   . Type 2 diabetes mellitus ([place]) 04/26/2019    Past Surgical History:  Procedure Laterality Date  . CARDIAC CATHETERIZATION    . FOOT SURGERY    . RIGHT/LEFT HEART CATH AND CORONARY ANGIOGRAPHY N/A 04/30/2019   Procedure: RIGHT/LEFT HEART CATH AND CORONARY ANGIOGRAPHY;  Surgeon: Troy Sine, MD;  Location: [place] [facility];  Service: Cardiovascular;  Laterality: N/A;    Vitals:   06/17/19 1302  BP: 122/83  Pulse: 87  SpO2: 94%    Subjective Assessment - 06/17/19 1304    Subjective  Pt reports that he is doing well today. He states that he had "a hard time walking" the afternoon after his initial evaluation. No falls since initial evaluation. No specific questions or concerns at this time.    Patient is accompained by:  Family member   Wife stays in waiting room   Pertinent History  Blake West is a 69 y.o. male with history of essential hypertension,  hypothyroidism, hemochromatosis, gout, sleep apnea ordered CPAP but not using, obesity, hyperlipidemia, newly diagnosed diabetes mellitus, who reports 2 weeks of deconditioning/DOE followed by an [place] from 04/25/19 to 05/01/19 for new onset CHF with newly diagnosed cardiomyopathy felt to be nonischemic with moderate CAD. He does not see physicians secondary to his wife is Native Optometrist and a Theatre stage manager and they prefer holistic medicine approach. Cardiology recommended starting ACE inhibitor/ARB/MRA/ARNI. He recommended referral to cardiac lab, schedule cardiac MRI, check BMP and CHF education. Echo on 04/26/2019, showed a new cardiomyopathy with an EF of 20-25%. Diagnostic cath showed moderate CAD, with recommendation to continue medical management and risk factor modification.  He is on aspirin and metoprolol.  He has declined statin therapy. Former smoker quit date 2004 never used smokeless tobacco. During admission he was IV diuresed with improvement in symptoms with a weight loss of 260 lb to 225 lb at discharge, with at least 21L net negative for the admission. He was referred for physical therapy due to physical deconditioning. Pt reports that he has had bilateral knee pain for a while as well as difficulty with his balance. He complains of bilateral lower extremity tingling in stocking distribution up to just above the ankles.  Limitations  Walking    Patient Stated Goals  "To walk again, maybe even run"    Currently in Pain?  No/denies    Pain Onset  --             TREATMENT   Ther-ex  NuStep L0-2 x 5 minutes for warm-up during history (2 minutes unbilled); Hooklying marching with green tband around knees 2 x 10 BLE; Hooklying clams with green tband 2s hold 2 x 10 BLE; Hooklying adductor squeeze with ball between knees 2s hold 2 x 10 BLE; Hooklying bridges 2 x 10; Supine SLR 2 x 10 BLE; Sidelying hip abd 2 x 10 BLE, assist required to complete sets with RLE; Seated LAQ  with 3# ankle weights (AW) 2 x 10 BLE; Seated HS curls with green tband 2 x 10 BLE; Standing marches with BUE support on walker x 10 BLE; Standing mini squats with BUE support on walker x 10;   Pt educated throughout session about proper posture and technique with exercises. Improved exercise technique, movement at target joints, use of target muscles after min to mod verbal, visual, tactile cues.    Pt demonstrates good motivation during session. He reports increase in knee pain and fatigue following initial evaluation so more open chain exercises today in supine and sitting. He did perform sit to stands and standing mini squats with less knee pain reported during mini squats so added these to HEP. Also included seated resisted marching and clams in his HEP. He denies any significant increase in his knee pain during session today. Pt encouraged to follow-up as scheduled. Pt will benefit from PT services to address deficits in strength, balance, and mobility in order to return to full function at home.                   PT Education - 06/17/19 1400    Education Details  HEP    Person(s) Educated  Patient    Methods  Explanation    Comprehension  Verbalized understanding       PT Short Term Goals - 06/12/19 1140      PT SHORT TERM GOAL #1   Title  Pt will be independent with HEP in order to improve strength and balance in order to decrease fall risk and improve function at home and work.    Time  6    Period  Weeks    Status  New    Target Date  07/24/19        PT Long Term Goals - 06/12/19 1140      PT LONG TERM GOAL #1   Title  Pt will improve BERG by at least 3 points in order to demonstrate clinically significant improvement in balance.    Baseline  06/12/19: 51/56    Time  12    Period  Weeks    Status  New    Target Date  06/12/19      PT LONG TERM GOAL #2   Title  Pt will decrease 5TSTS by at least 3 seconds in order to demonstrate clinically  significant improvement in LE strength.    Baseline  06/12/19: 19.7s    Time  12    Period  Weeks    Status  New    Target Date  09/04/19      PT LONG TERM GOAL #3   Title  Pt will decrease TUG to below 14 seconds/decrease in order to demonstrate decreased fall risk.  Baseline  06/12/19: 15.8s    Time  12    Period  Weeks    Status  New    Target Date  09/04/19      PT LONG TERM GOAL #4   Title  Pt will increase 6MWT by at least 21m (157ft) in order to demonstrate clinically significant improvement in cardiopulmonary endurance and community ambulation    Baseline  06/12/19: 605' no asssitive device, CGA    Time  12    Period  Weeks    Status  New    Target Date  09/04/19            Plan - 06/17/19 1306    Clinical Impression Statement  Pt demonstrates good motivation during session. He reports increase in knee pain and fatigue following initial evaluation so more open chain exercises today in supine and sitting. He did perform sit to stands and standing mini squats with less knee pain reported during mini squats so added these to HEP. Also included seated resisted marching and clams in his HEP. He denies any significant increase in his knee pain during session today. Pt encouraged to follow-up as scheduled. Pt will benefit from PT services to address deficits in strength, balance, and mobility in order to return to full function at home.    Personal Factors and Comorbidities  Age;Comorbidity 3+    Comorbidities  CHF, CAD, OA, HTN, DM    Examination-Activity Limitations  Locomotion Level;Bend;Stairs;Squat    Examination-Participation Restrictions  Community Activity;Yard Work    Merchant navy officer  Evolving/Moderate complexity    Rehab Potential  Good    PT Frequency  2x / week    PT Duration  12 weeks    PT Treatment/Interventions  ADLs/Self Care Home Management;Aquatic Therapy;Canalith Repostioning;Cryotherapy;Electrical Stimulation;Iontophoresis 4mg /ml  Dexamethasone;Moist Heat;Traction;Ultrasound;Contrast Bath;DME Instruction;Gait training;Stair training;Functional mobility training;Therapeutic activities;Therapeutic exercise;Balance training;Neuromuscular re-education;Patient/family education;Manual techniques;Passive range of motion;Dry needling;Energy conservation;Vestibular;Joint Manipulations    PT Next Visit Plan  Continue with strength and balance, eventually add balance exercises to his HEP    PT Home Exercise Plan  Medbridge Access Code: [email or web address] (seated clams, seated marches, mini squats)    Consulted and Agree with Plan of Care  Patient       Patient will benefit from skilled therapeutic intervention in order to improve the following deficits and impairments:  Decreased strength, Abnormal gait, Decreased balance, Difficulty walking  Visit Diagnosis: Muscle weakness (generalized)  Unsteadiness on feet     Problem List Patient Active Problem List   Diagnosis Date Noted  . Hypothyroidism 05/11/2019  . Gout of multiple sites 05/11/2019  . Type 2 diabetes mellitus ([place]) 04/26/2019  . Hyperlipidemia 04/26/2019  . Acute congestive heart failure ([place]) 04/25/2019  . Essential hypertension 04/25/2019  . OSA on CPAP 04/25/2019  . Class 2 severe obesity due to excess calories with serious comorbidity and body mass index (BMI) of 36.0 to 36.9 in adult [facility]) 04/25/2019   Phillips Grout PT, DPT, GCS  Sherilyn Windhorst 06/17/2019, 2:04 PM  [place] MAIN [facility] SERVICES [address] [place], [place], [address] Phone: [phone]   Fax:  [phone]  Name: PAULINO CORK MRN: [id number] Date of Birth: [birth year]

## 2019-06-19 ENCOUNTER — Encounter (HOSPITAL_BASED_OUTPATIENT_CLINIC_OR_DEPARTMENT_OTHER): Payer: PPO | Admitting: Pulmonary Disease

## 2019-06-19 ENCOUNTER — Ambulatory Visit: Payer: PPO

## 2019-06-19 ENCOUNTER — Other Ambulatory Visit: Payer: Self-pay

## 2019-06-19 DIAGNOSIS — M6281 Muscle weakness (generalized): Secondary | ICD-10-CM | POA: Diagnosis not present

## 2019-06-19 DIAGNOSIS — R2681 Unsteadiness on feet: Secondary | ICD-10-CM

## 2019-06-19 NOTE — Patient Instructions (Signed)
Access Code: [email or web address] URL: [email or web address] Date: 06/17/2019 Prepared by: Roxana Hires  Exercises Seated March with Resistance - 2 x daily - 7 x weekly - 2 sets - 10 reps - 3s hold Seated Hip Abduction with Resistance - 2 x daily - 7 x weekly - 2 sets - 10 reps - 3s hold Mini Squat with Counter Support - 2 x daily - 7 x weekly - 2 sets - 10 reps Standing Tandem Balance with Counter Support - 2 x daily - 7 x weekly - 2 x 30s with each foot forward hold

## 2019-06-19 NOTE — Therapy (Signed)
[place] MAIN [facility] SERVICES [address] [place], [place], [address] Phone: [phone]   Fax:  [phone]  Physical Therapy Treatment  Patient Details  Name: Blake West MRN: [id number] Date of Birth: October 07, 1950 Referring Provider (PT): Denice Paradise   Encounter Date: 06/19/2019  PT End of Session - 06/19/19 0851    Visit Number  3    Number of Visits  25    Date for PT Re-Evaluation  09/04/19    Authorization Type  eval: 06/12/19    PT Start Time  0846    PT Stop Time  0930    PT Time Calculation (min)  44 min    Equipment Utilized During Treatment  Gait belt    Activity Tolerance  Patient tolerated treatment well    Behavior During Therapy  [facility] for tasks assessed/performed       Past Medical History:  Diagnosis Date  . Abnormal EKG   . Biventricular failure ([place])   . CAD (coronary artery disease)   . Colorectal polyps   . Gout   . Hyperlipidemia LDL goal <70   . Hypertension   . Hypothyroidism   . Kidney stone   . Pneumonia   . Testicular hypofunction   . Type 2 diabetes mellitus ([place]) 04/26/2019    Past Surgical History:  Procedure Laterality Date  . CARDIAC CATHETERIZATION    . FOOT SURGERY    . RIGHT/LEFT HEART CATH AND CORONARY ANGIOGRAPHY N/A 04/30/2019   Procedure: RIGHT/LEFT HEART CATH AND CORONARY ANGIOGRAPHY;  Surgeon: Troy Sine, MD;  Location: [place] [facility];  Service: Cardiovascular;  Laterality: N/A;    There were no vitals filed for this visit.  Subjective Assessment - 06/19/19 0849    Subjective  Pt reports that he is doing well today with the exception of 2/10 L knee soreness which is chronic. He reports less soreness and fatigue after the last therapy session. No falls since last therapy session. No specific questions or concerns at this time.    Patient is accompained by:  Family member   Wife stays in waiting room   Pertinent History  Blake West is a 69 y.o. male with history of  essential hypertension, hypothyroidism, hemochromatosis, gout, sleep apnea ordered CPAP but not using, obesity, hyperlipidemia, newly diagnosed diabetes mellitus, who reports 2 weeks of deconditioning/DOE followed by an [place] from 04/25/19 to 05/01/19 for new onset CHF with newly diagnosed cardiomyopathy felt to be nonischemic with moderate CAD. He does not see physicians secondary to his wife is Native Optometrist and a Theatre stage manager and they prefer holistic medicine approach. Cardiology recommended starting ACE inhibitor/ARB/MRA/ARNI. He recommended referral to cardiac lab, schedule cardiac MRI, check BMP and CHF education. Echo on 04/26/2019, showed a new cardiomyopathy with an EF of 20-25%. Diagnostic cath showed moderate CAD, with recommendation to continue medical management and risk factor modification.  He is on aspirin and metoprolol.  He has declined statin therapy. Former smoker quit date 2004 never used smokeless tobacco. During admission he was IV diuresed with improvement in symptoms with a weight loss of 260 lb to 225 lb at discharge, with at least 21L net negative for the admission. He was referred for physical therapy due to physical deconditioning. Pt reports that he has had bilateral knee pain for a while as well as difficulty with his balance. He complains of bilateral lower extremity tingling in stocking distribution up to just above the ankles.  Limitations  Walking    Patient Stated Goals  "To walk again, maybe even run"    Currently in Pain?  Yes    Pain Score  2     Pain Location  Knee    Pain Orientation  Left    Pain Descriptors / Indicators  Aching    Pain Type  Chronic pain    Pain Onset  More than a month ago           TREATMENT   Ther-ex  NuStep L1-2 x 5 minutes for warm-up during history (2 minutes unbilled); Seated marching with green tband around knees x 20 BLE; Seated clams with green tband 2s hold x 20 BLE; Seated adductor squeeze with ball between  knees 2s hold x 20 BLE; Seated LAQ with 4# ankle weights x 20 BLE;   Standing exercises with 4# ankle weights; High knee hip flexion marches x 20 BLE; Hip abduction x 20 BLE; Hip extension x 20 BLE; HS curls x 20 BLE;   Standing mini squats with BUE support on // bars 2 x 10; Standing heel raises with BUE support on // bars 2 x 10;   Neuromuscular Re-education  Airex NBOS eyes open/closed x 30s each; Airex NBOS horizontal and vertical head turns x 30s each; Airex alternating 6" step taps without UE support x 10 BLE; Staggered stance balance with rearfoot on Airex pad and front foot on 6" step x 30s each; Tandem balance alternating forward LE without UE support x 30s each (added to HEP);   Pt educated throughout session about proper posture and technique with exercises. Improved exercise technique, movement at target joints, use of target muscles after min to mod verbal, visual, tactile cues.    Pt demonstrates good motivation during session. He is able to perform all exercises as instructed without reports of increase in knee pain. Progressed strengthening today to include more standing exercises. Balance exercises also initiated today and added tandem balance to HEP. Pt encouraged to follow-up as scheduled. Pt will benefit from PT services to address deficits in strength, balance, and mobility in order to return to full function at home.                         PT Short Term Goals - 06/12/19 1140      PT SHORT TERM GOAL #1   Title  Pt will be independent with HEP in order to improve strength and balance in order to decrease fall risk and improve function at home and work.    Time  6    Period  Weeks    Status  New    Target Date  07/24/19        PT Long Term Goals - 06/12/19 1140      PT LONG TERM GOAL #1   Title  Pt will improve BERG by at least 3 points in order to demonstrate clinically significant improvement in balance.    Baseline  06/12/19:  51/56    Time  12    Period  Weeks    Status  New    Target Date  06/12/19      PT LONG TERM GOAL #2   Title  Pt will decrease 5TSTS by at least 3 seconds in order to demonstrate clinically significant improvement in LE strength.    Baseline  06/12/19: 19.7s    Time  12    Period  Weeks    Status  New    Target Date  09/04/19      PT LONG TERM GOAL #3   Title  Pt will decrease TUG to below 14 seconds/decrease in order to demonstrate decreased fall risk.    Baseline  06/12/19: 15.8s    Time  12    Period  Weeks    Status  New    Target Date  09/04/19      PT LONG TERM GOAL #4   Title  Pt will increase 6MWT by at least 33m (153ft) in order to demonstrate clinically significant improvement in cardiopulmonary endurance and community ambulation    Baseline  06/12/19: 605' no asssitive device, CGA    Time  12    Period  Weeks    Status  New    Target Date  09/04/19            Plan - 06/19/19 0851    Clinical Impression Statement  Pt demonstrates good motivation during session. He is able to perform all exercises as instructed without reports of increase in knee pain. Progressed strengthening today to include more standing exercises. Balance exercises also initiated today and added tandem balance to HEP. Pt encouraged to follow-up as scheduled. Pt will benefit from PT services to address deficits in strength, balance, and mobility in order to return to full function at home.    Personal Factors and Comorbidities  Age;Comorbidity 3+    Comorbidities  CHF, CAD, OA, HTN, DM    Examination-Activity Limitations  Locomotion Level;Bend;Stairs;Squat    Examination-Participation Restrictions  Community Activity;Yard Work    Merchant navy officer  Evolving/Moderate complexity    Rehab Potential  Good    PT Frequency  2x / week    PT Duration  12 weeks    PT Treatment/Interventions  ADLs/Self Care Home Management;Aquatic Therapy;Canalith Repostioning;Cryotherapy;Electrical  Stimulation;Iontophoresis 4mg /ml Dexamethasone;Moist Heat;Traction;Ultrasound;Contrast Bath;DME Instruction;Gait training;Stair training;Functional mobility training;Therapeutic activities;Therapeutic exercise;Balance training;Neuromuscular re-education;Patient/family education;Manual techniques;Passive range of motion;Dry needling;Energy conservation;Vestibular;Joint Manipulations    PT Next Visit Plan  Continue with strength and balance, eventually add balance exercises to his HEP    PT Home Exercise Plan  Medbridge Access Code: [email or web address] (seated clams, seated marches, mini squats, tandem balance)    Consulted and Agree with Plan of Care  Patient       Patient will benefit from skilled therapeutic intervention in order to improve the following deficits and impairments:  Decreased strength, Abnormal gait, Decreased balance, Difficulty walking  Visit Diagnosis: Muscle weakness (generalized)  Unsteadiness on feet     Problem List Patient Active Problem List   Diagnosis Date Noted  . Hypothyroidism 05/11/2019  . Gout of multiple sites 05/11/2019  . Type 2 diabetes mellitus ([place]) 04/26/2019  . Hyperlipidemia 04/26/2019  . Acute congestive heart failure ([place]) 04/25/2019  . Essential hypertension 04/25/2019  . OSA on CPAP 04/25/2019  . Class 2 severe obesity due to excess calories with serious comorbidity and body mass index (BMI) of 36.0 to 36.9 in adult [facility]) 04/25/2019   Phillips Grout PT, DPT, GCS  Huprich,Jason 06/19/2019, 9:39 AM  [place] MAIN [facility] SERVICES [address] [place], [place], [address] Phone: [phone]   Fax:  [phone]  Name: Blake West MRN: [id number] Date of Birth: Feb 01, 1950

## 2019-06-21 ENCOUNTER — Other Ambulatory Visit: Payer: Self-pay

## 2019-06-21 ENCOUNTER — Ambulatory Visit: Payer: PPO | Admitting: Internal Medicine

## 2019-06-21 NOTE — Progress Notes (Deleted)
Follow-up Outpatient Visit Date: 06/21/2019  Primary Care Provider: Marval Regal, NP [address] [place] [address]  Chief Complaint: ***  HPI:  Blake West is a 69 y.o. male with history of moderate coronary artery disease, recently diagnosed biventricular failure felt to be due to nonischemic cardiomyopathy, pulmonary hypertension, PVCs/NSVT, PACs, newly diagnosed diabetes mellitus, obesity, obstructive sleep apnea not on CPAP, hyperlipidemia, prior tobacco use, and reported hemochromatosis, who presents for follow-up of heart failure.  This is my first time meeting Blake West.  He was last seen in our office on 05/09/2019 by Christell Faith, PA, after being hospitalized earlier in the month at [facility] with worsening shortness of breath and edema.  He was found to have acute systolic heart failure with biventricular dysfunction.  EF was 20 to 25%.  He was discharged on metoprolol succinate and Lasix.  ACE inhibitor/ARB/ARNI/aldosterone antagonist treatment was deferred given mild hyperkalemia during the hospitalization.  At his follow-up visit last month, patient was doing well without dyspnea.  Lower extremity edema was improving.  He was tolerating furosemide and metoprolol succinate well.  Cardiac MRI earlier this week showed a mildly dilated left ventricle with an EF of 25% late gadolinium enhancement involving the basal anteroseptal and inferoseptal walls was noted and felt to be suspicious for prior viral myocarditis given its noncoronary distribution. --------------------------------------------------------------------------------------------------  Past Medical History:  Diagnosis Date  . Abnormal EKG   . Biventricular failure ([place])   . CAD (coronary artery disease)   . Colorectal polyps   . Gout   . Hyperlipidemia LDL goal <70   . Hypertension   . Hypothyroidism   . Kidney stone   . Pneumonia   . Testicular hypofunction   . Type 2 diabetes mellitus ([place])  04/26/2019   Past Surgical History:  Procedure Laterality Date  . CARDIAC CATHETERIZATION    . FOOT SURGERY    . RIGHT/LEFT HEART CATH AND CORONARY ANGIOGRAPHY N/A 04/30/2019   Procedure: RIGHT/LEFT HEART CATH AND CORONARY ANGIOGRAPHY;  Surgeon: Troy Sine, Blake West;  Location: [place] [facility];  Service: Cardiovascular;  Laterality: N/A;    No outpatient medications have been marked as taking for the 06/21/19 encounter (Appointment) with Riya Huxford, Blake Gave, Blake West.    Allergies: Codeine  Social History   Tobacco Use  . Smoking status: Former Smoker    Packs/day: 1.00    Years: 30.00    Pack years: 30.00    Types: Cigarettes    Quit date: 01/24/2002    Years since quitting: 17.4  . Smokeless tobacco: Never Used  Substance Use Topics  . Alcohol use: No  . Drug use: No    Family History  Problem Relation Age of Onset  . Heart attack Mother 82  . Pulmonary fibrosis Father   . Congestive Heart Failure Brother   . Heart attack Brother   . Heart attack Brother   . Early death Sister   . Kidney disease Paternal Grandfather   . Diabetes Sister     Review of Systems: A 12-system review of systems was performed and was negative except as noted in the HPI.  --------------------------------------------------------------------------------------------------  Physical Exam: There were no vitals taken for this visit.  General:  *** HEENT: No conjunctival pallor or scleral icterus. Facemask in place. Neck: Supple without lymphadenopathy, thyromegaly, JVD, or HJR. Lungs: Normal work of breathing. Clear to auscultation bilaterally without wheezes or crackles. Heart: Regular rate and rhythm without murmurs, rubs, or gallops. Non-displaced PMI. Abd:  Bowel sounds present. Soft, NT/ND without hepatosplenomegaly Ext: No lower extremity edema. Radial, PT, and DP pulses are 2+ bilaterally. Skin: Warm and dry without rash.  EKG:  ***  Lab Results  Component Value Date   WBC 11.7 (H)  05/01/2019   HGB 17.8 (H) 05/01/2019   HCT 56.5 (H) 05/01/2019   MCV 90.7 05/01/2019   PLT 310 05/01/2019    Lab Results  Component Value Date   NA 140 05/09/2019   K 4.2 05/09/2019   CL 101 05/09/2019   CO2 20 05/09/2019   BUN 23 05/09/2019   CREATININE 1.00 05/09/2019   GLUCOSE 90 05/09/2019   ALT 36 05/09/2019    Lab Results  Component Value Date   CHOL 156 04/25/2019   HDL 25 (L) 04/25/2019   LDLCALC 113 (H) 04/25/2019   TRIG 88 04/25/2019   CHOLHDL 6.2 04/25/2019    --------------------------------------------------------------------------------------------------  ASSESSMENT AND PLAN: Blake Gave Luzelena Heeg, Blake West 06/21/2019 6:06 AM

## 2019-06-26 ENCOUNTER — Ambulatory Visit: Payer: PPO | Admitting: Internal Medicine

## 2019-06-26 ENCOUNTER — Ambulatory Visit: Payer: PPO | Attending: Nurse Practitioner

## 2019-06-26 ENCOUNTER — Ambulatory Visit: Payer: PPO

## 2019-06-26 ENCOUNTER — Other Ambulatory Visit: Payer: Self-pay

## 2019-06-26 ENCOUNTER — Encounter: Payer: Self-pay | Admitting: Internal Medicine

## 2019-06-26 VITALS — BP 104/72 | HR 92 | Ht 71.0 in | Wt 233.0 lb

## 2019-06-26 DIAGNOSIS — R2681 Unsteadiness on feet: Secondary | ICD-10-CM

## 2019-06-26 DIAGNOSIS — I2510 Atherosclerotic heart disease of native coronary artery without angina pectoris: Secondary | ICD-10-CM

## 2019-06-26 DIAGNOSIS — I5022 Chronic systolic (congestive) heart failure: Secondary | ICD-10-CM | POA: Diagnosis not present

## 2019-06-26 DIAGNOSIS — M6281 Muscle weakness (generalized): Secondary | ICD-10-CM | POA: Insufficient documentation

## 2019-06-26 DIAGNOSIS — D751 Secondary polycythemia: Secondary | ICD-10-CM

## 2019-06-26 DIAGNOSIS — I428 Other cardiomyopathies: Secondary | ICD-10-CM | POA: Diagnosis not present

## 2019-06-26 DIAGNOSIS — I5041 Acute combined systolic (congestive) and diastolic (congestive) heart failure: Secondary | ICD-10-CM

## 2019-06-26 MED ORDER — SPIRONOLACTONE 25 MG PO TABS: 12.5000 mg | ORAL_TABLET | Freq: Every day | ORAL | 5 refills | Status: DC

## 2019-06-26 NOTE — Therapy (Signed)
[place] MAIN [facility] SERVICES [address] [place], [place], [address] Phone: [phone]   Fax:  [phone]  Physical Therapy Treatment  Patient Details  Name: Blake West MRN: [id number] Date of Birth: 06-Jan-1951 Referring Provider (PT): Denice Paradise   Encounter Date: 06/26/2019  PT End of Session - 06/26/19 0935    Visit Number  4    Number of Visits  25    Date for PT Re-Evaluation  09/04/19    Authorization Type  eval: 06/12/19    PT Start Time  0930    PT Stop Time  1014    PT Time Calculation (min)  44 min    Equipment Utilized During Treatment  Gait belt    Activity Tolerance  Patient tolerated treatment well    Behavior During Therapy  [facility] for tasks assessed/performed       Past Medical History:  Diagnosis Date  . Abnormal EKG   . Biventricular failure ([place])   . CAD (coronary artery disease)   . Colorectal polyps   . Gout   . Hyperlipidemia LDL goal <70   . Hypertension   . Hypothyroidism   . Kidney stone   . Pneumonia   . Testicular hypofunction   . Type 2 diabetes mellitus ([place]) 04/26/2019    Past Surgical History:  Procedure Laterality Date  . CARDIAC CATHETERIZATION    . FOOT SURGERY    . RIGHT/LEFT HEART CATH AND CORONARY ANGIOGRAPHY N/A 04/30/2019   Procedure: RIGHT/LEFT HEART CATH AND CORONARY ANGIOGRAPHY;  Surgeon: Troy Sine, MD;  Location: [place] [facility];  Service: Cardiovascular;  Laterality: N/A;    There were no vitals filed for this visit.  Subjective Assessment - 06/26/19 0934    Subjective  Patient reports feeling stiff today, chopped wood yesterday. Patient reports no falls or LOB since last session.    Patient is accompained by:  Family member   Wife stays in waiting room   Pertinent History  Blake West is a 69 y.o. male with history of essential hypertension, hypothyroidism, hemochromatosis, gout, sleep apnea ordered CPAP but not using, obesity, hyperlipidemia, newly diagnosed  diabetes mellitus, who reports 2 weeks of deconditioning/DOE followed by an [place] from 04/25/19 to 05/01/19 for new onset CHF with newly diagnosed cardiomyopathy felt to be nonischemic with moderate CAD. He does not see physicians secondary to his wife is Native Optometrist and a Theatre stage manager and they prefer holistic medicine approach. Cardiology recommended starting ACE inhibitor/ARB/MRA/ARNI. He recommended referral to cardiac lab, schedule cardiac MRI, check BMP and CHF education. Echo on 04/26/2019, showed a new cardiomyopathy with an EF of 20-25%. Diagnostic cath showed moderate CAD, with recommendation to continue medical management and risk factor modification.  He is on aspirin and metoprolol.  He has declined statin therapy. Former smoker quit date 2004 never used smokeless tobacco. During admission he was IV diuresed with improvement in symptoms with a weight loss of 260 lb to 225 lb at discharge, with at least 21L net negative for the admission. He was referred for physical therapy due to physical deconditioning. Pt reports that he has had bilateral knee pain for a while as well as difficulty with his balance. He complains of bilateral lower extremity tingling in stocking distribution up to just above the ankles.    Limitations  Walking    Patient Stated Goals  "To walk again, maybe even run"    Currently in Pain?  Yes  Pain Score  2     Pain Location  Knee    Pain Orientation  Left    Pain Descriptors / Indicators  Aching    Pain Type  Chronic pain    Pain Onset  More than a month ago    Pain Frequency  Intermittent              TREATMENT     Ther-ex  NuStep L2 x 4 minutes, cues for keeping RPM> 50 for cardiovascular and musculoskeletal challenge.  Seated marching with green tband around knees x 20 BLE; Seated clams with green tband 2s hold x 20 BLE; Seated adductor squeeze with ball between knees 2s hold x 20 BLE;    Standing with # 4 ankle weight: CGA for  stability  -Hip extension with bilateral upper extremity support, cueing for neutral hip alignment, upright posture for optimal muscle recruitment, and sequencing, 10x each LE,  -Hip abduction with bilateral upper extremity support, cueing for neutral foot alignment for correct muscle activation, 10x each LE -Hip flexion with bilateral upper extremity support, cueing for body mechanics, speed of muscle recruitment for optimal strengthening and stabilization 10x each LE -Hamstring curl with bilateral  upper extremity support, cueing for knee alignment for recruitment of hamstring musculature, 10x each LE   Seated with # 4 ankle weights  -Seated marches with upright posture, back away from back of chair for abdominal/trunk activation/stabilization, 10x each LE -Seated LAQ with 3 second holds, 10x each LE, cueing for muscle activation and sequencing for neutral alignment -Seated IR/ER with cueing for stabilizing knee placement with lateral foot movement for optimal muscle recruitment, 10x each LE  10x STS from raised plinth table, 3 count up/down  Seated:  Rainbow ball between ankles, adduction and long arc quad 10x  GTB adduction 15x each LE  GTB resisted pf 15x      Neuromuscular Re-education   airex balance beam: 6x side step no UE support cues for decreased velocity  Airex alternating 6" step taps without UE support x 10 BLE; Staggered stance balance with rearfoot on Airex pad and front foot on 6" step x 30s each;    Pt educated throughout session about proper posture and technique with exercises. Improved exercise technique, movement at target joints, use of target muscles after min to mod verbal, visual, tactile cues.     Patient's vitals monitored throughout session. Seated rest breaks throughout required to return therapeutic range.                        PT Education - 06/26/19 0935    Education Details  exercise technique, body mechanics    Person(s)  Educated  Patient    Methods  Explanation;Demonstration;Tactile cues;Verbal cues    Comprehension  Verbalized understanding;Returned demonstration;Verbal cues required;Tactile cues required       PT Short Term Goals - 06/12/19 1140      PT SHORT TERM GOAL #1   Title  Pt will be independent with HEP in order to improve strength and balance in order to decrease fall risk and improve function at home and work.    Time  6    Period  Weeks    Status  New    Target Date  07/24/19        PT Long Term Goals - 06/12/19 1140      PT LONG TERM GOAL #1   Title  Pt will improve BERG by at least  3 points in order to demonstrate clinically significant improvement in balance.    Baseline  06/12/19: 51/56    Time  12    Period  Weeks    Status  New    Target Date  06/12/19      PT LONG TERM GOAL #2   Title  Pt will decrease 5TSTS by at least 3 seconds in order to demonstrate clinically significant improvement in LE strength.    Baseline  06/12/19: 19.7s    Time  12    Period  Weeks    Status  New    Target Date  09/04/19      PT LONG TERM GOAL #3   Title  Pt will decrease TUG to below 14 seconds/decrease in order to demonstrate decreased fall risk.    Baseline  06/12/19: 15.8s    Time  12    Period  Weeks    Status  New    Target Date  09/04/19      PT LONG TERM GOAL #4   Title  Pt will increase 6MWT by at least 69m (192ft) in order to demonstrate clinically significant improvement in cardiopulmonary endurance and community ambulation    Baseline  06/12/19: 605' no asssitive device, CGA    Time  12    Period  Weeks    Status  New    Target Date  09/04/19            Plan - 06/26/19 1721    Clinical Impression Statement  Patient requires occasional rest breaks to return HR to therapeutic range.  Visual and demonstrative cueing are required throughout session. Pt encouraged to follow-up as scheduled. Pt will benefit from PT services to address deficits in strength, balance, and  mobility in order to return to full function at home.    Personal Factors and Comorbidities  Age;Comorbidity 3+    Comorbidities  CHF, CAD, OA, HTN, DM    Examination-Activity Limitations  Locomotion Level;Bend;Stairs;Squat    Examination-Participation Restrictions  Community Activity;Yard Work    Merchant navy officer  Evolving/Moderate complexity    Rehab Potential  Good    PT Frequency  2x / week    PT Duration  12 weeks    PT Treatment/Interventions  ADLs/Self Care Home Management;Aquatic Therapy;Canalith Repostioning;Cryotherapy;Electrical Stimulation;Iontophoresis 4mg /ml Dexamethasone;Moist Heat;Traction;Ultrasound;Contrast Bath;DME Instruction;Gait training;Stair training;Functional mobility training;Therapeutic activities;Therapeutic exercise;Balance training;Neuromuscular re-education;Patient/family education;Manual techniques;Passive range of motion;Dry needling;Energy conservation;Vestibular;Joint Manipulations    PT Next Visit Plan  Continue with strength and balance, eventually add balance exercises to his HEP    PT Home Exercise Plan  Medbridge Access Code: [email or web address] (seated clams, seated marches, mini squats, tandem balance)    Consulted and Agree with Plan of Care  Patient       Patient will benefit from skilled therapeutic intervention in order to improve the following deficits and impairments:  Decreased strength, Abnormal gait, Decreased balance, Difficulty walking  Visit Diagnosis: Muscle weakness (generalized)  Unsteadiness on feet     Problem List Patient Active Problem List   Diagnosis Date Noted  . Hypothyroidism 05/11/2019  . Gout of multiple sites 05/11/2019  . Type 2 diabetes mellitus ([place]) 04/26/2019  . Hyperlipidemia 04/26/2019  . Acute congestive heart failure ([place]) 04/25/2019  . Essential hypertension 04/25/2019  . OSA on CPAP 04/25/2019  . Class 2 severe obesity due to excess calories with serious comorbidity and body mass index (BMI) of  36.0 to 36.9 in adult [facility]) 04/25/2019   Janna Arch, PT, DPT  06/26/2019, 5:22 PM  [place] MAIN [facility] SERVICES [address] [place], [place], [address] Phone: [phone]   Fax:  [phone]  Name: Blake West MRN: [id number] Date of Birth: Nov 05, 1950

## 2019-06-26 NOTE — Patient Instructions (Signed)
Medication Instructions:  Your physician has recommended you make the following change in your medication:   START Spironolactone 12.5mg  (1/2 tablet) daily. An Rx has been sent to your pharmacy.  *If you need a refill on your cardiac medications before your next appointment, please call your pharmacy*   Lab Work: [facility] today.   Your physician recommends that you return for lab work in: 1 week Please have your lab drawn at the medical mall. You do not need an appointment.  Lab hours are Mon-Fri 7:30am-6pm   If you have labs (blood work) drawn today and your tests are completely normal, you will receive your results only by: Marland Kitchen MyChart Message (if you have MyChart) OR . A paper copy in the mail If you have any lab test that is abnormal or we need to change your treatment, we will call you to review the results.   Testing/Procedures: None ordered   Follow-Up: At [facility], you and your health needs are our priority.  As part of our continuing mission to provide you with exceptional heart care, we have created designated Provider Care Teams.  These Care Teams include your primary Cardiologist (physician) and Advanced Practice Providers (APPs -  Physician Assistants and Nurse Practitioners) who all work together to provide you with the care you need, when you need it.  We recommend signing up for the patient portal called "MyChart".  Sign up information is provided on this After Visit Summary.  MyChart is used to connect with patients for Virtual Visits (Telemedicine).  Patients are able to view lab/test results, encounter notes, upcoming appointments, etc.  Non-urgent messages can be sent to your provider as well.   To learn more about what you can do with MyChart, go to [email or web address].    Your next appointment:   4 week(s)  The format for your next appointment:   In Person  Provider:   Christell Faith, PA-C   Other Instructions N/A

## 2019-06-26 NOTE — Progress Notes (Signed)
Follow-up Outpatient Visit Date: 06/26/2019  Primary Care Provider: Marval Regal, NP [address] [place] [address]  Chief Complaint: Follow-up cardiomyopathy  HPI:  Blake West is a 69 y.o. male with history of moderate coronary artery disease, recently diagnosed biventricular failure felt to be due to nonischemic cardiomyopathy, pulmonary hypertension, PVCs/NSVT, PACs, newly diagnosed diabetes mellitus, obesity, obstructive sleep apnea not on CPAP, hyperlipidemia, prior tobacco use, and reported hemochromatosis, who presents for follow-up of heart failure.  This is my first time meeting Blake West.  He was last seen in our office on 05/09/2019 by Christell Faith, PA, after being hospitalized earlier in the month at [facility] with worsening shortness of breath and edema.  He was found to have acute systolic heart failure with biventricular dysfunction.  EF was 20 to 25%.  He was discharged on metoprolol succinate and Lasix.  ACE inhibitor/ARB/ARNI/aldosterone antagonist treatment was deferred given mild hyperkalemia during the hospitalization.  At his follow-up visit last month, patient was doing well without dyspnea.  Lower extremity edema was improving.  He was tolerating furosemide and metoprolol succinate well.  Cardiac MRI earlier this week showed a mildly dilated left ventricle with an EF of 25% late gadolinium enhancement involving the basal anteroseptal and inferoseptal walls was noted and felt to be suspicious for prior viral myocarditis given its noncoronary distribution.  Today, Blake West reports that he is feeling much better with significant improvement in his leg swelling. He still notices some swelling in the left leg as well as occasional exertional dyspnea. He has been able to do some strenuous activity such as "throwing around dogs" without limitations. He denies chest pain, palpitations, and lightheadedness. Continued intermittent leg pain is his primary limiting  factor. Blake West also reports occasional irregular heartbeat when he feels his pulse. This has been a longstanding finding. He is tolerating his current medications well. His wife remains concerned that iron has contributed to her husband's cardiomyopathy, given that several of his family members have had heart issues and elevated iron levels.  --------------------------------------------------------------------------------------------------  Past Medical History:  Diagnosis Date  . Abnormal EKG   . Biventricular failure ([place])   . CAD (coronary artery disease)   . Colorectal polyps   . Gout   . Hyperlipidemia LDL goal <70   . Hypertension   . Hypothyroidism   . Kidney stone   . Pneumonia   . Testicular hypofunction   . Type 2 diabetes mellitus ([place]) 04/26/2019   Past Surgical History:  Procedure Laterality Date  . CARDIAC CATHETERIZATION    . FOOT SURGERY    . RIGHT/LEFT HEART CATH AND CORONARY ANGIOGRAPHY N/A 04/30/2019   Procedure: RIGHT/LEFT HEART CATH AND CORONARY ANGIOGRAPHY;  Surgeon: Troy Sine, MD;  Location: [place] [facility];  Service: Cardiovascular;  Laterality: N/A;    Current Meds  Medication Sig  . Ascorbic Acid (VITAMIN C) 100 MG tablet Take 500 mg by mouth daily.  Marland Kitchen aspirin EC 81 MG EC tablet Take 1 tablet (81 mg total) by mouth daily.  . cholecalciferol (VITAMIN D) 400 UNITS TABS tablet Take 400 Units by mouth 3 (three) times daily.   . furosemide (LASIX) 80 MG tablet Take 1 tablet (80 mg total) by mouth daily.  . metoprolol succinate (TOPROL-XL) 25 MG 24 hr tablet Take 1 tablet (25 mg total) by mouth daily.  . Multiple Vitamin (THERA) TABS Take 1 tablet by mouth daily.  . NON FORMULARY Take 1 capsule by mouth with  breakfast, with lunch, and with evening meal. Alphalplex  . OVER THE COUNTER MEDICATION Thyroid otc medicine Simplex m  . Vitamin E 400 units TABS Take 400 Units by mouth 2 (two) times daily.     Allergies: Codeine  Social History    Tobacco Use  . Smoking status: Former Smoker    Packs/day: 1.00    Years: 30.00    Pack years: 30.00    Types: Cigarettes    Quit date: 01/24/2002    Years since quitting: 17.4  . Smokeless tobacco: Never Used  Substance Use Topics  . Alcohol use: No  . Drug use: No    Family History  Problem Relation Age of Onset  . Heart attack Mother 75  . Pulmonary fibrosis Father   . Congestive Heart Failure Brother   . Heart attack Brother   . Heart attack Brother   . Early death Sister   . Kidney disease Paternal Grandfather   . Diabetes Sister     Review of Systems: A 12-system review of systems was performed and was negative except as noted in the HPI.  --------------------------------------------------------------------------------------------------  Physical Exam: BP 104/72 (BP Location: Left Arm, Patient Position: Sitting, Cuff Size: Normal)   Pulse 92   Ht 5\' 11"  (1.803 m)   Wt 233 lb (105.7 kg)   SpO2 94%   BMI 32.50 kg/m   General: NAD. Accompanied by his wife. HEENT: No conjunctival pallor or scleral icterus. Facemask in place. Neck: No JVD or HJR. Lungs: Normal work of breathing. Clear to auscultation bilaterally without wheezes or crackles. Heart: Regular rate and rhythm without murmurs, rubs, or gallops. Abd: Bowel sounds present. Soft, NT/ND. Ext: 1+ pretibial edema bilaterally. 2+ pedal pulses bilaterally.   Lab Results  Component Value Date   WBC 11.7 (H) 05/01/2019   HGB 17.8 (H) 05/01/2019   HCT 56.5 (H) 05/01/2019   MCV 90.7 05/01/2019   PLT 310 05/01/2019    Lab Results  Component Value Date   NA 138 06/26/2019   K 4.3 06/26/2019   CL 98 06/26/2019   CO2 21 06/26/2019   BUN 16 06/26/2019   CREATININE 0.95 06/26/2019   GLUCOSE 107 (H) 06/26/2019   ALT 36 05/09/2019    Lab Results  Component Value Date   CHOL 156 04/25/2019   HDL 25 (L) 04/25/2019   LDLCALC 113 (H) 04/25/2019   TRIG 88 04/25/2019   CHOLHDL 6.2 04/25/2019    --------------------------------------------------------------------------------------------------  ASSESSMENT AND PLAN: Chronic HFrEF due to nonischemic cardiomyopathy: Blake West appears mildly volume overloaded on exam today with 1+ pretibial edema. He has intermittent exertional dyspnea consistent with NYHA class II heart failure. Currently, he is only on a low-dose metoprolol for goal-directed medical therapy, as his blood pressure and potassium levels previously precluded addition of an ACE inhibitor/ARB or aldosterone antagonist. His blood pressure remains on the soft side today. I have therefore recommended that we begin spironolactone 12.5 mg daily. I will plan to check a basic metabolic panel today and again in 1 week. Should his blood pressure remained stable, we could consider challenging him with a low-dose ACE inhibitor or ARB at his follow-up visit. Repeat echocardiogram will need to be performed once evidence-based heart failure therapy has been optimized and it has been at least 3 months from his index hospitalization/echo in April. If EF remains less than 35%, ICD placement will need to be considered.  Nonobstructive coronary artery disease: Continue aspirin and readdress addition of statin therapy in the  future (he declined at his prior visit).  Polycythemia: Elevated hemoglobins have been noted on several occasions. Ferritin during recent hospitalization was normal. Blake West wife remains concerned that hemochromatosis could be playing a role in her husband's heart failure. I reassured her that the ferritin level and recent cardiac MRI are not consistent with this. However, she wishes to evaluate this further. We will therefore repeat an iron panel today and consider referral to hematology if transferrin saturation is elevated.  Follow-up: Return to clinic in 1 month.  Nelva Bush, MD 06/27/2019 2:08 PM

## 2019-06-27 ENCOUNTER — Encounter: Payer: Self-pay | Admitting: Internal Medicine

## 2019-06-27 DIAGNOSIS — D751 Secondary polycythemia: Secondary | ICD-10-CM | POA: Insufficient documentation

## 2019-06-27 DIAGNOSIS — I2510 Atherosclerotic heart disease of native coronary artery without angina pectoris: Secondary | ICD-10-CM | POA: Insufficient documentation

## 2019-06-27 DIAGNOSIS — I428 Other cardiomyopathies: Secondary | ICD-10-CM | POA: Insufficient documentation

## 2019-06-27 LAB — BASIC METABOLIC PANEL
BUN/Creatinine Ratio: 17 (ref 10–24)
BUN: 16 mg/dL (ref 8–27)
CO2: 21 mmol/L (ref 20–29)
Calcium: 9.6 mg/dL (ref 8.6–10.2)
Chloride: 98 mmol/L (ref 96–106)
Creatinine, Ser: 0.95 mg/dL (ref 0.76–1.27)
GFR calc Af Amer: 94 mL/min/{1.73_m2} (ref >59)
GFR calc non Af Amer: 81 mL/min/{1.73_m2} (ref >59)
Glucose: 107 mg/dL — ABNORMAL HIGH (ref 65–99)
Potassium: 4.3 mmol/L (ref 3.5–5.2)
Sodium: 138 mmol/L (ref 134–144)

## 2019-06-27 LAB — IRON AND TIBC
Iron Saturation: 20 % (ref 15–55)
Iron: 63 ug/dL (ref 38–169)
Total Iron Binding Capacity: 313 ug/dL (ref 250–450)
UIBC: 250 ug/dL (ref 111–343)

## 2019-06-28 ENCOUNTER — Encounter: Payer: Self-pay | Admitting: *Deleted

## 2019-07-01 ENCOUNTER — Ambulatory Visit: Payer: PPO | Admitting: Physical Therapy

## 2019-07-03 ENCOUNTER — Ambulatory Visit: Payer: PPO

## 2019-07-03 ENCOUNTER — Other Ambulatory Visit: Payer: Self-pay

## 2019-07-03 ENCOUNTER — Ambulatory Visit: Payer: PPO | Admitting: Physical Therapy

## 2019-07-03 ENCOUNTER — Encounter: Payer: Self-pay | Admitting: Physical Therapy

## 2019-07-03 DIAGNOSIS — M6281 Muscle weakness (generalized): Secondary | ICD-10-CM | POA: Diagnosis not present

## 2019-07-03 DIAGNOSIS — R2681 Unsteadiness on feet: Secondary | ICD-10-CM

## 2019-07-03 NOTE — Therapy (Signed)
[place] MAIN [facility] SERVICES [address] [place], [place], [address] Phone: [phone]   Fax:  [phone]  Physical Therapy Treatment  Patient Details  Name: Blake West MRN: [id number] Date of Birth: 06-06-1950 Referring Provider (PT): Denice Paradise   Encounter Date: 07/03/2019  PT End of Session - 07/03/19 0938    Visit Number  5    Number of Visits  25    Date for PT Re-Evaluation  09/04/19    Authorization Type  eval: 06/12/19    PT Start Time  0931    PT Stop Time  1014    PT Time Calculation (min)  43 min    Equipment Utilized During Treatment  Gait belt    Activity Tolerance  Patient tolerated treatment well    Behavior During Therapy  [facility] for tasks assessed/performed       Past Medical History:  Diagnosis Date  . Abnormal EKG   . Biventricular failure ([place])   . CAD (coronary artery disease)   . Colorectal polyps   . Gout   . Hyperlipidemia LDL goal <70   . Hypertension   . Hypothyroidism   . Kidney stone   . Pneumonia   . Testicular hypofunction   . Type 2 diabetes mellitus ([place]) 04/26/2019    Past Surgical History:  Procedure Laterality Date  . CARDIAC CATHETERIZATION    . FOOT SURGERY    . RIGHT/LEFT HEART CATH AND CORONARY ANGIOGRAPHY N/A 04/30/2019   Procedure: RIGHT/LEFT HEART CATH AND CORONARY ANGIOGRAPHY;  Surgeon: Troy Sine, MD;  Location: [place] [facility];  Service: Cardiovascular;  Laterality: N/A;    There were no vitals filed for this visit.  Subjective Assessment - 07/03/19 0935    Subjective  Patient is having L ankle soreness today and both knees 3/5 .    Patient is accompained by:  Family member   Wife stays in waiting room   Pertinent History  Blake West is a 69 y.o. male with history of essential hypertension, hypothyroidism, hemochromatosis, gout, sleep apnea ordered CPAP but not using, obesity, hyperlipidemia, newly diagnosed diabetes mellitus, who reports 2 weeks of  deconditioning/DOE followed by an [place] from 04/25/19 to 05/01/19 for new onset CHF with newly diagnosed cardiomyopathy felt to be nonischemic with moderate CAD. He does not see physicians secondary to his wife is Native Optometrist and a Theatre stage manager and they prefer holistic medicine approach. Cardiology recommended starting ACE inhibitor/ARB/MRA/ARNI. He recommended referral to cardiac lab, schedule cardiac MRI, check BMP and CHF education. Echo on 04/26/2019, showed a new cardiomyopathy with an EF of 20-25%. Diagnostic cath showed moderate CAD, with recommendation to continue medical management and risk factor modification.  He is on aspirin and metoprolol.  He has declined statin therapy. Former smoker quit date 2004 never used smokeless tobacco. During admission he was IV diuresed with improvement in symptoms with a weight loss of 260 lb to 225 lb at discharge, with at least 21L net negative for the admission. He was referred for physical therapy due to physical deconditioning. Pt reports that he has had bilateral knee pain for a while as well as difficulty with his balance. He complains of bilateral lower extremity tingling in stocking distribution up to just above the ankles.    Limitations  Walking    Patient Stated Goals  "To walk again, maybe even run"    Currently in Pain?  Yes    Pain Score  5  Pain Location  Knee    Pain Orientation  Right;Left    Pain Descriptors / Indicators  Aching    Pain Type  Chronic pain    Pain Onset  More than a month ago    Pain Frequency  Intermittent    Aggravating Factors   walking    Pain Relieving Factors  rest    Effect of Pain on Daily Activities  slower       Ther-ex  Octane fitness x 5 mins  Matrix fwd/bwd, side to side 22. 5 lbs x 5 reps Hurdle step fwd/bwd, side to side x 20  Lunges to BOSU ball x 15 BLE Step ups to 6-inch stool from foam x 20   Neuromuscular Re-education  1/2 foam roll balance with flat side up 30s x 2 reps 1/2 foam  roll balance with flat side down 30s x 2 reps 1/2 foam roll tandem balance alternating forward LE 30s x 2 each LE forward 1/2 foam roll tandem balance latera toe touch to stepping stone  LE x 10 LE forward Lateral side steps from foam to 6 inch stool left and right x 15 Backwards stepping from foam to 6 inch stool x 15        Pt educated throughout session about proper posture and technique with exercises. Improved exercise technique, movement at target joints, use of target muscles after min to mod verbal, visual, tactile cues. CGA and Min to mod verbal cues used throughout with increased in postural sway and LOB most seen with narrow base of support and while on uneven surfaces. Continues to have balance deficits typical with diagnosis. Patient performs intermediate level exercises without pain behaviors and needs verbal cuing for postural alignment and head positioning Tactile cues and assistance needed to keep lower leg and knee in neutral to avoid compensations with ankle motions.                          PT Education - 07/03/19 0935    Education Details  HEP    Person(s) Educated  Patient    Methods  Explanation    Comprehension  Verbalized understanding       PT Short Term Goals - 06/12/19 1140      PT SHORT TERM GOAL #1   Title  Pt will be independent with HEP in order to improve strength and balance in order to decrease fall risk and improve function at home and work.    Time  6    Period  Weeks    Status  New    Target Date  07/24/19        PT Long Term Goals - 06/12/19 1140      PT LONG TERM GOAL #1   Title  Pt will improve BERG by at least 3 points in order to demonstrate clinically significant improvement in balance.    Baseline  06/12/19: 51/56    Time  12    Period  Weeks    Status  New    Target Date  06/12/19      PT LONG TERM GOAL #2   Title  Pt will decrease 5TSTS by at least 3 seconds in order to demonstrate clinically significant  improvement in LE strength.    Baseline  06/12/19: 19.7s    Time  12    Period  Weeks    Status  New    Target Date  09/04/19  PT LONG TERM GOAL #3   Title  Pt will decrease TUG to below 14 seconds/decrease in order to demonstrate decreased fall risk.    Baseline  06/12/19: 15.8s    Time  12    Period  Weeks    Status  New    Target Date  09/04/19      PT LONG TERM GOAL #4   Title  Pt will increase 6MWT by at least 37m (111ft) in order to demonstrate clinically significant improvement in cardiopulmonary endurance and community ambulation    Baseline  06/12/19: 605' no asssitive device, CGA    Time  12    Period  Weeks    Status  New    Target Date  09/04/19            Plan - 07/03/19 0086    Clinical Impression Statement  Patient instructed in intermediate balance/strengthening exercise. Patient fatigues quickly requiring short rest breaks in between intermediate exercise. Patient instructed in advanced strengthening with increased repetition/resistance. Patient requires CGA for intermediate balance exercise especially with less rail assist. Patient would benefit from additional skilled PT intervention to improve balance/gait safety and reduce fall risk.   Personal Factors and Comorbidities  Age;Comorbidity 3+    Comorbidities  CHF, CAD, OA, HTN, DM    Examination-Activity Limitations  Locomotion Level;Bend;Stairs;Squat    Examination-Participation Restrictions  Community Activity;Yard Work    Merchant navy officer  Evolving/Moderate complexity    Rehab Potential  Good    PT Frequency  2x / week    PT Duration  12 weeks    PT Treatment/Interventions  ADLs/Self Care Home Management;Aquatic Therapy;Canalith Repostioning;Cryotherapy;Electrical Stimulation;Iontophoresis 4mg /ml Dexamethasone;Moist Heat;Traction;Ultrasound;Contrast Bath;DME Instruction;Gait training;Stair training;Functional mobility training;Therapeutic activities;Therapeutic exercise;Balance  training;Neuromuscular re-education;Patient/family education;Manual techniques;Passive range of motion;Dry needling;Energy conservation;Vestibular;Joint Manipulations    PT Next Visit Plan  Continue with strength and balance, eventually add balance exercises to his HEP    PT Home Exercise Plan  Medbridge Access Code: [email or web address] (seated clams, seated marches, mini squats, tandem balance)    Consulted and Agree with Plan of Care  Patient       Patient will benefit from skilled therapeutic intervention in order to improve the following deficits and impairments:  Decreased strength, Abnormal gait, Decreased balance, Difficulty walking  Visit Diagnosis: Muscle weakness (generalized)  Unsteadiness on feet     Problem List Patient Active Problem List   Diagnosis Date Noted  . Nonischemic cardiomyopathy ([place]) 06/27/2019  . CAD in native artery 06/27/2019  . Polycythemia 06/27/2019  . Hypothyroidism 05/11/2019  . Gout of multiple sites 05/11/2019  . Type 2 diabetes mellitus ([place]) 04/26/2019  . Hyperlipidemia 04/26/2019  . Acute congestive heart failure ([place]) 04/25/2019  . Essential hypertension 04/25/2019  . OSA on CPAP 04/25/2019  . Class 2 severe obesity due to excess calories with serious comorbidity and body mass index (BMI) of 36.0 to 36.9 in adult [facility]) 04/25/2019    Alanson Puls, PT DPT 07/03/2019, 9:39 AM  [place] MAIN [facility] SERVICES [place], [place], [address] Phone: [phone]   Fax:  [phone]  Name: Blake West MRN: [id number] Date of Birth: 18-Nov-1950

## 2019-07-05 ENCOUNTER — Other Ambulatory Visit: Payer: Self-pay

## 2019-07-05 ENCOUNTER — Other Ambulatory Visit
Admission: RE | Admit: 2019-07-05 | Discharge: 2019-07-05 | Disposition: A | Discharge status: Home / Self Care | Payer: PPO | Attending: Psychiatry | Admitting: Psychiatry

## 2019-07-05 DIAGNOSIS — I5022 Chronic systolic (congestive) heart failure: Secondary | ICD-10-CM | POA: Diagnosis not present

## 2019-07-05 DIAGNOSIS — Z0000 Encounter for general adult medical examination without abnormal findings: Secondary | ICD-10-CM | POA: Diagnosis present

## 2019-07-05 LAB — BASIC METABOLIC PANEL
Anion gap: 10 (ref 5–15)
BUN: 22 mg/dL (ref 8–23)
CO2: 26 mmol/L (ref 22–32)
Calcium: 9.1 mg/dL (ref 8.9–10.3)
Chloride: 100 mmol/L (ref 98–111)
Creatinine, Ser: 1.03 mg/dL (ref 0.61–1.24)
GFR calc Af Amer: >60 mL/min (ref >60)
GFR calc non Af Amer: >60 mL/min (ref >60)
Glucose, Bld: 99 mg/dL (ref 70–99)
Potassium: 4 mmol/L (ref 3.5–5.1)
Sodium: 136 mmol/L (ref 135–145)

## 2019-07-09 ENCOUNTER — Ambulatory Visit: Payer: PPO | Admitting: Physical Therapy

## 2019-07-09 ENCOUNTER — Other Ambulatory Visit: Payer: Self-pay

## 2019-07-09 ENCOUNTER — Telehealth: Payer: Self-pay | Admitting: Nurse Practitioner

## 2019-07-09 DIAGNOSIS — M6281 Muscle weakness (generalized): Secondary | ICD-10-CM

## 2019-07-09 DIAGNOSIS — R2681 Unsteadiness on feet: Secondary | ICD-10-CM

## 2019-07-09 NOTE — Telephone Encounter (Signed)
Patient's wife called in and wanted kimberly to look at his blood she wanted to know why she schedule him a endocrinologist

## 2019-07-09 NOTE — Therapy (Signed)
[place] MAIN [facility] SERVICES [address] [place], [place], [address] Phone: [phone]   Fax:  [phone]  Physical Therapy Treatment  Patient Details  Name: Blake West MRN: [id number] Date of Birth: May 21, 1950 Referring Provider (PT): Denice Paradise   Encounter Date: 07/09/2019   PT End of Session - 07/09/19 0941    Visit Number 6    Number of Visits 25    Date for PT Re-Evaluation 09/04/19    Authorization Type eval: 06/12/19    PT Start Time 0937    PT Stop Time 1015    PT Time Calculation (min) 38 min    Equipment Utilized During Treatment Gait belt    Activity Tolerance Patient tolerated treatment well    Behavior During Therapy [facility] for tasks assessed/performed           Past Medical History:  Diagnosis Date   Abnormal EKG    Biventricular failure ([place])    CAD (coronary artery disease)    Colorectal polyps    Gout    Hyperlipidemia LDL goal <70    Hypertension    Hypothyroidism    Kidney stone    Pneumonia    Testicular hypofunction    Type 2 diabetes mellitus ([place]) 04/26/2019    Past Surgical History:  Procedure Laterality Date   CARDIAC CATHETERIZATION     FOOT SURGERY     RIGHT/LEFT HEART CATH AND CORONARY ANGIOGRAPHY N/A 04/30/2019   Procedure: RIGHT/LEFT HEART CATH AND CORONARY ANGIOGRAPHY;  Surgeon: Troy Sine, MD;  Location: [place] [facility];  Service: Cardiovascular;  Laterality: N/A;    There were no vitals filed for this visit.   Subjective Assessment - 07/09/19 0939    Subjective Patient is having L ankle soreness today and both knees 3/5 .    Patient is accompained by: Family member   Wife stays in waiting room   Pertinent History Blake West is a 69 y.o. male with history of essential hypertension, hypothyroidism, hemochromatosis, gout, sleep apnea ordered CPAP but not using, obesity, hyperlipidemia, newly diagnosed diabetes mellitus, who reports 2 weeks of deconditioning/DOE  followed by an [place] from 04/25/19 to 05/01/19 for new onset CHF with newly diagnosed cardiomyopathy felt to be nonischemic with moderate CAD. He does not see physicians secondary to his wife is Native Optometrist and a Theatre stage manager and they prefer holistic medicine approach. Cardiology recommended starting ACE inhibitor/ARB/MRA/ARNI. He recommended referral to cardiac lab, schedule cardiac MRI, check BMP and CHF education. Echo on 04/26/2019, showed a new cardiomyopathy with an EF of 20-25%. Diagnostic cath showed moderate CAD, with recommendation to continue medical management and risk factor modification.  He is on aspirin and metoprolol.  He has declined statin therapy. Former smoker quit date 2004 never used smokeless tobacco. During admission he was IV diuresed with improvement in symptoms with a weight loss of 260 lb to 225 lb at discharge, with at least 21L net negative for the admission. He was referred for physical therapy due to physical deconditioning. Pt reports that he has had bilateral knee pain for a while as well as difficulty with his balance. He complains of bilateral lower extremity tingling in stocking distribution up to just above the ankles.    Limitations Walking    Patient Stated Goals "To walk again, maybe even run"    Currently in Pain? Yes    Pain Score 6     Pain Location Ankle    Pain Orientation  Left    Pain Descriptors / Indicators Aching    Pain Type Chronic pain    Pain Onset More than a month ago    Pain Frequency Constant    Aggravating Factors  walking    Pain Relieving Factors rest    Effect of Pain on Daily Activities slower    Multiple Pain Sites No           Ther-ex  Octane fitness x 5 mins  Hip flexion marches with 2# ankle weights x 10 bilateral Hip abduction with 2# x 10 bilateral HS curls with 2# AW x 10 bilateral Hip extension with 2# x 10 bilateral  Sit to stand without UE support from regular height chair x 10   Lunges to BOSU ball x 15  BLE Heel raises x 15 x 2 sets High marching x 20 BLE  Step ups to 6-inch stool x 20  Eccentric step downs from 3 inch stool x 10 verbal cues to complete slow and tap heel.  BUE CGA Patient needs occasional verbal cueing to improve posture and cueing to correctly perform exercises slowly, holding at end of range to increase motor firing of desired muscle to encourage fatigue.                           PT Education - 07/09/19 0940    Education Details HEP    Person(s) Educated Patient    Methods Explanation    Comprehension Verbalized understanding            PT Short Term Goals - 06/12/19 1140      PT SHORT TERM GOAL #1   Title Pt will be independent with HEP in order to improve strength and balance in order to decrease fall risk and improve function at home and work.    Time 6    Period Weeks    Status New    Target Date 07/24/19             PT Long Term Goals - 06/12/19 1140      PT LONG TERM GOAL #1   Title Pt will improve BERG by at least 3 points in order to demonstrate clinically significant improvement in balance.    Baseline 06/12/19: 51/56    Time 12    Period Weeks    Status New    Target Date 06/12/19      PT LONG TERM GOAL #2   Title Pt will decrease 5TSTS by at least 3 seconds in order to demonstrate clinically significant improvement in LE strength.    Baseline 06/12/19: 19.7s    Time 12    Period Weeks    Status New    Target Date 09/04/19      PT LONG TERM GOAL #3   Title Pt will decrease TUG to below 14 seconds/decrease in order to demonstrate decreased fall risk.    Baseline 06/12/19: 15.8s    Time 12    Period Weeks    Status New    Target Date 09/04/19      PT LONG TERM GOAL #4   Title Pt will increase 6MWT by at least 67m (166ft) in order to demonstrate clinically significant improvement in cardiopulmonary endurance and community ambulation    Baseline 06/12/19: 605' no asssitive device, CGA    Time 12    Period Weeks     Status New    Target Date 09/04/19  Plan - 07/09/19 0941    Clinical Impression Statement Pt has continued to show improvements in LE strength, mobility despite L ankle pain.  Pt was able to progress exercises today without an increase in pain or discomfort to further strengthen LE and core musculature in an effort to further progress overall functional mobility and stability with movement.  Pt would continue to benefit from skilled services in order to further strengthen B LE's..   Personal Factors and Comorbidities Age;Comorbidity 3+    Comorbidities CHF, CAD, OA, HTN, DM    Examination-Activity Limitations Locomotion Level;Bend;Stairs;Squat    Examination-Participation Restrictions Community Activity;Yard Work    Merchant navy officer Evolving/Moderate complexity    Rehab Potential Good    PT Frequency 2x / week    PT Duration 12 weeks    PT Treatment/Interventions ADLs/Self Care Home Management;Aquatic Therapy;Canalith Repostioning;Cryotherapy;Electrical Stimulation;Iontophoresis 4mg /ml Dexamethasone;Moist Heat;Traction;Ultrasound;Contrast Bath;DME Instruction;Gait training;Stair training;Functional mobility training;Therapeutic activities;Therapeutic exercise;Balance training;Neuromuscular re-education;Patient/family education;Manual techniques;Passive range of motion;Dry needling;Energy conservation;Vestibular;Joint Manipulations    PT Next Visit Plan Continue with strength and balance, eventually add balance exercises to his HEP    PT Home Exercise Plan Medbridge Access Code: [email or web address] (seated clams, seated marches, mini squats, tandem balance)    Consulted and Agree with Plan of Care Patient           Patient will benefit from skilled therapeutic intervention in order to improve the following deficits and impairments:  Decreased strength, Abnormal gait, Decreased balance, Difficulty walking  Visit Diagnosis: Muscle weakness  (generalized)  Unsteadiness on feet     Problem List Patient Active Problem List   Diagnosis Date Noted   Nonischemic cardiomyopathy ([place]) 06/27/2019   CAD in native artery 06/27/2019   Polycythemia 06/27/2019   Hypothyroidism 05/11/2019   Gout of multiple sites 05/11/2019   Type 2 diabetes mellitus ([place]) 04/26/2019   Hyperlipidemia 04/26/2019   Acute congestive heart failure ([place]) 04/25/2019   Essential hypertension 04/25/2019   OSA on CPAP 04/25/2019   Class 2 severe obesity due to excess calories with serious comorbidity and body mass index (BMI) of 36.0 to 36.9 in adult [facility]) 04/25/2019    Alanson Puls, PT DPT 07/09/2019, 9:42 AM  [place], [place], [address] Phone: [phone]   Fax:  [phone]  Name: Blake West MRN: [id number] Date of Birth: [birth year]

## 2019-07-10 NOTE — Telephone Encounter (Signed)
Spoke with wife about the endocrinology referral and answered her questions about why appt was needed

## 2019-07-11 ENCOUNTER — Ambulatory Visit: Payer: PPO

## 2019-07-15 ENCOUNTER — Other Ambulatory Visit: Payer: Self-pay

## 2019-07-15 ENCOUNTER — Ambulatory Visit: Payer: PPO

## 2019-07-15 DIAGNOSIS — R2681 Unsteadiness on feet: Secondary | ICD-10-CM

## 2019-07-15 DIAGNOSIS — M6281 Muscle weakness (generalized): Secondary | ICD-10-CM

## 2019-07-15 NOTE — Therapy (Signed)
[place] MAIN [facility] SERVICES [address] [place], [place], [address] Phone: [phone]   Fax:  [phone]  Physical Therapy Treatment  Patient Details  Name: Blake West MRN: [id number] Date of Birth: 1950-03-20 Referring Provider (PT): Denice Paradise   Encounter Date: 07/15/2019   PT End of Session - 07/15/19 0935    Visit Number 7    Number of Visits 25    Date for PT Re-Evaluation 09/04/19    Authorization Type eval: 06/12/19    PT Start Time 0930    PT Stop Time 1010    PT Time Calculation (min) 40 min    Equipment Utilized During Treatment Gait belt    Activity Tolerance Patient tolerated treatment well;No increased pain    Behavior During Therapy WFL for tasks assessed/performed           Past Medical History:  Diagnosis Date  . Abnormal EKG   . Biventricular failure ([place])   . CAD (coronary artery disease)   . Colorectal polyps   . Gout   . Hyperlipidemia LDL goal <70   . Hypertension   . Hypothyroidism   . Kidney stone   . Pneumonia   . Testicular hypofunction   . Type 2 diabetes mellitus ([place]) 04/26/2019    Past Surgical History:  Procedure Laterality Date  . CARDIAC CATHETERIZATION    . FOOT SURGERY    . RIGHT/LEFT HEART CATH AND CORONARY ANGIOGRAPHY N/A 04/30/2019   Procedure: RIGHT/LEFT HEART CATH AND CORONARY ANGIOGRAPHY;  Surgeon: Troy Sine, MD;  Location: [place] [facility];  Service: Cardiovascular;  Laterality: N/A;    There were no vitals filed for this visit.   Subjective Assessment - 07/15/19 0933    Subjective Pt reports he is doing well today. He has some soreness in ankles due to trying out new shoes. Pt reports he is getting registered for [employer] in [place].    Pertinent History Blake West is a 69 y.o. male with history of essential hypertension, hypothyroidism, hemochromatosis, gout, sleep apnea ordered CPAP but not using, obesity, hyperlipidemia, newly diagnosed diabetes  mellitus, who reports 2 weeks of deconditioning/DOE followed by an [place] from 04/25/19 to 05/01/19 for new onset CHF with newly diagnosed cardiomyopathy felt to be nonischemic with moderate CAD. He does not see physicians secondary to his wife is Native Optometrist and a Theatre stage manager and they prefer holistic medicine approach. Cardiology recommended starting ACE inhibitor/ARB/MRA/ARNI. He recommended referral to cardiac lab, schedule cardiac MRI, check BMP and CHF education. Echo on 04/26/2019, showed a new cardiomyopathy with an EF of 20-25%. Diagnostic cath showed moderate CAD, with recommendation to continue medical management and risk factor modification.  He is on aspirin and metoprolol.  He has declined statin therapy. Former smoker quit date 2004 never used smokeless tobacco. During admission he was IV diuresed with improvement in symptoms with a weight loss of 260 lb to 225 lb at discharge, with at least 21L net negative for the admission. He was referred for physical therapy due to physical deconditioning. Pt reports that he has had bilateral knee pain for a while as well as difficulty with his balance. He complains of bilateral lower extremity tingling in stocking distribution up to just above the ankles.    Limitations Walking    Patient Stated Goals "To walk again, maybe even run"    Currently in Pain? Yes    Pain Score 5     Pain Location Ankle  Pain Orientation Left           INTERVENTION THIS DATE:   Therapeutic Exercise  -Nustep for cardiovascular conditioning:  *3-minute monitors: HR: 89 BPM, SpO2: 92% *end of exercise: HR: 98 BPM, SpO2: 92%   Standing semitandem balance 2x30sec each, hands free  Airex NBOS eyes closed 2x30; Airex NBOS horizontal and vertical head turns x 30s each; Airex alternating 2" step taps without UE support x 15 BLE, alternating pattern;  Standing heel raises with BUE support on // bars 1x15 (4lb AWs) (achieved mostly through passive ROM via trunk  sway in sagittal plane)  Standing hamstring curls 1x15 4lbAWs, , 1x15 BLE @ 5lb AW  Steated LAQ 1x15 bilat @ 4lb AW, 1x15 BLE @ 5lb AW  STS from chair +airex 2x10, hands free     PT Short Term Goals - 06/12/19 1140      PT SHORT TERM GOAL #1   Title Pt will be independent with HEP in order to improve strength and balance in order to decrease fall risk and improve function at home and work.    Time 6    Period Weeks    Status New    Target Date 07/24/19             PT Long Term Goals - 06/12/19 1140      PT LONG TERM GOAL #1   Title Pt will improve BERG by at least 3 points in order to demonstrate clinically significant improvement in balance.    Baseline 06/12/19: 51/56    Time 12    Period Weeks    Status New    Target Date 06/12/19      PT LONG TERM GOAL #2   Title Pt will decrease 5TSTS by at least 3 seconds in order to demonstrate clinically significant improvement in LE strength.    Baseline 06/12/19: 19.7s    Time 12    Period Weeks    Status New    Target Date 09/04/19      PT LONG TERM GOAL #3   Title Pt will decrease TUG to below 14 seconds/decrease in order to demonstrate decreased fall risk.    Baseline 06/12/19: 15.8s    Time 12    Period Weeks    Status New    Target Date 09/04/19      PT LONG TERM GOAL #4   Title Pt will increase 6MWT by at least 70m (141ft) in order to demonstrate clinically significant improvement in cardiopulmonary endurance and community ambulation    Baseline 06/12/19: 605' no asssitive device, CGA    Time 12    Period Weeks    Status New    Target Date 09/04/19                 Plan - 07/15/19 0936    Clinical Impression Statement In general, patient demonstrating good tolerance to therapy session this date, reasonable accommodations are alllowed in-session to allow adequate rest between activities as needed. Pt does have some ankle pain after intermittently, especially in narrow stance. Pt able to progress AW load this  date without issue. Pt needed cues to attend to sets as he does not keep count. Pt also able increase Nustep time to 6 minutes, VSS. All interventional executed without any exacerbation of pain or other symptoms.  Pt given intermittent multimodal cues to teach best possible form with exercises. Pt continues to make steady progress toward most goals. No home exercise updates made at  this time.    Personal Factors and Comorbidities Age;Comorbidity 3+    Comorbidities CHF, CAD, OA, HTN, DM    Examination-Activity Limitations Locomotion Level;Bend;Stairs;Squat    Examination-Participation Restrictions Community Activity;Yard Work    Merchant navy officer Evolving/Moderate complexity    Clinical Decision Making Moderate    Rehab Potential Good    PT Frequency 2x / week    PT Duration 12 weeks    PT Treatment/Interventions ADLs/Self Care Home Management;Aquatic Therapy;Canalith Repostioning;Cryotherapy;Electrical Stimulation;Iontophoresis 4mg /ml Dexamethasone;Moist Heat;Traction;Ultrasound;Contrast Bath;DME Instruction;Gait training;Stair training;Functional mobility training;Therapeutic activities;Therapeutic exercise;Balance training;Neuromuscular re-education;Patient/family education;Manual techniques;Passive range of motion;Dry needling;Energy conservation;Vestibular;Joint Manipulations    PT Next Visit Plan Continue with strength and balance, eventually add balance exercises to his HEP    PT Home Exercise Plan Medbridge Access Code: [email or web address] (seated clams, seated marches, mini squats, tandem balance)    Consulted and Agree with Plan of Care Patient           Patient will benefit from skilled therapeutic intervention in order to improve the following deficits and impairments:  Decreased strength, Abnormal gait, Decreased balance, Difficulty walking  Visit Diagnosis: Muscle weakness (generalized)  Unsteadiness on feet     Problem List Patient Active Problem List   Diagnosis  Date Noted  . Nonischemic cardiomyopathy ([place]) 06/27/2019  . CAD in native artery 06/27/2019  . Polycythemia 06/27/2019  . Hypothyroidism 05/11/2019  . Gout of multiple sites 05/11/2019  . Type 2 diabetes mellitus ([place]) 04/26/2019  . Hyperlipidemia 04/26/2019  . Acute congestive heart failure ([place]) 04/25/2019  . Essential hypertension 04/25/2019  . OSA on CPAP 04/25/2019  . Class 2 severe obesity due to excess calories with serious comorbidity and body mass index (BMI) of 36.0 to 36.9 in adult [facility]) 04/25/2019   10:07 AM, 07/15/19 Etta Grandchild, PT, DPT Physical Therapist - [facility]  Outpatient Physical [place] [phone]    Etta Grandchild 07/15/2019, 9:37 AM  [place] MAIN [facility] SERVICES [address] [place], [place], [address] Phone: [phone]   Fax:  [phone]  Name: Blake West MRN: [id number] Date of Birth: January 10, 1951

## 2019-07-16 ENCOUNTER — Ambulatory Visit: Payer: PPO

## 2019-07-17 ENCOUNTER — Other Ambulatory Visit: Payer: Self-pay

## 2019-07-17 ENCOUNTER — Ambulatory Visit: Payer: PPO

## 2019-07-17 DIAGNOSIS — M6281 Muscle weakness (generalized): Secondary | ICD-10-CM

## 2019-07-17 DIAGNOSIS — R2681 Unsteadiness on feet: Secondary | ICD-10-CM

## 2019-07-17 NOTE — Therapy (Signed)
[place] MAIN [facility] SERVICES [address] [place], [place], [address] Phone: [phone]   Fax:  [phone]  Physical Therapy Treatment  Patient Details  Name: Blake West MRN: [id number] Date of Birth: 30-Dec-1950 Referring Provider (PT): Denice Paradise   Encounter Date: 07/17/2019   PT End of Session - 07/17/19 0940    Visit Number 8    Number of Visits 25    Date for PT Re-Evaluation 09/04/19    Authorization Type eval: 06/12/19    PT Start Time 0931    PT Stop Time 1011    PT Time Calculation (min) 40 min    Equipment Utilized During Treatment Gait belt    Activity Tolerance Patient tolerated treatment well;No increased pain    Behavior During Therapy WFL for tasks assessed/performed           Past Medical History:  Diagnosis Date  . Abnormal EKG   . Biventricular failure ([place])   . CAD (coronary artery disease)   . Colorectal polyps   . Gout   . Hyperlipidemia LDL goal <70   . Hypertension   . Hypothyroidism   . Kidney stone   . Pneumonia   . Testicular hypofunction   . Type 2 diabetes mellitus ([place]) 04/26/2019    Past Surgical History:  Procedure Laterality Date  . CARDIAC CATHETERIZATION    . FOOT SURGERY    . RIGHT/LEFT HEART CATH AND CORONARY ANGIOGRAPHY N/A 04/30/2019   Procedure: RIGHT/LEFT HEART CATH AND CORONARY ANGIOGRAPHY;  Surgeon: Troy Sine, MD;  Location: [place] [facility];  Service: Cardiovascular;  Laterality: N/A;    There were no vitals filed for this visit.   Subjective Assessment - 07/17/19 0938    Subjective Pt in pain today. Changed out a lot of electrical receptacles so was on knees a lot and squating/stooping. Did not get a chance to go to [facility] to be set up for silver sneakers.    Pertinent History Blake West is a 69 y.o. male with history of essential hypertension, hypothyroidism, hemochromatosis, gout, sleep apnea ordered CPAP but not using, obesity, hyperlipidemia, newly diagnosed  diabetes mellitus, who reports 2 weeks of deconditioning/DOE followed by an [place] from 04/25/19 to 05/01/19 for new onset CHF with newly diagnosed cardiomyopathy felt to be nonischemic with moderate CAD. He does not see physicians secondary to his wife is Native Optometrist and a Theatre stage manager and they prefer holistic medicine approach. Cardiology recommended starting ACE inhibitor/ARB/MRA/ARNI. He recommended referral to cardiac lab, schedule cardiac MRI, check BMP and CHF education. Echo on 04/26/2019, showed a new cardiomyopathy with an EF of 20-25%. Diagnostic cath showed moderate CAD, with recommendation to continue medical management and risk factor modification.  He is on aspirin and metoprolol.  He has declined statin therapy. Former smoker quit date 2004 never used smokeless tobacco. During admission he was IV diuresed with improvement in symptoms with a weight loss of 260 lb to 225 lb at discharge, with at least 21L net negative for the admission. He was referred for physical therapy due to physical deconditioning. Pt reports that he has had bilateral knee pain for a while as well as difficulty with his balance. He complains of bilateral lower extremity tingling in stocking distribution up to just above the ankles.    Currently in Pain? Yes    Pain Score 6     Pain Location --   at knees  INTERVENTION THIS DATE:  -3MWT: 0.76m/s OR 322ft, limited by knees (no improvement since eval)  -5xSTS: 14.56sec hands free (much improved and achieved LT goal)   -Nustep for cardiovascular conditioning: 6 minutes,  *3-minute vitals: HR: 143 BPM, SpO2: 95% (HR per oximeter, unable to palpate reliably due to feeble pulse)  *end of exercise (6 minutes): HR: 110 BPM, SpO2: 92% (HR per dinamap BP)  -3 minute AMB: 354ft, no device  0.82m/s   -Standing hamstring curls 1x15 5lbAWs -Standing Marching 1x20 bilat alternating 5lb AW -Steated LAQ 1x15 BLE @ 5lb AW  -STS from chair +airex 1x10 c 10lb  weight (lightheaded)  -Airex stance, 4inch toe taps, 1UE support, 20x alternating -semitandem foamy standing, horizontal head turns x30 alternating      PT Short Term Goals - 06/12/19 1140      PT SHORT TERM GOAL #1   Title Pt will be independent with HEP in order to improve strength and balance in order to decrease fall risk and improve function at home and work.    Time 6    Period Weeks    Status New    Target Date 07/24/19             PT Long Term Goals - 06/12/19 1140      PT LONG TERM GOAL #1   Title Pt will improve BERG by at least 3 points in order to demonstrate clinically significant improvement in balance.    Baseline 06/12/19: 51/56    Time 12    Period Weeks    Status New    Target Date 06/12/19      PT LONG TERM GOAL #2   Title Pt will decrease 5TSTS by at least 3 seconds in order to demonstrate clinically significant improvement in LE strength.    Baseline 06/12/19: 19.7s    Time 12    Period Weeks    Status New    Target Date 09/04/19      PT LONG TERM GOAL #3   Title Pt will decrease TUG to below 14 seconds/decrease in order to demonstrate decreased fall risk.    Baseline 06/12/19: 15.8s    Time 12    Period Weeks    Status New    Target Date 09/04/19      PT LONG TERM GOAL #4   Title Pt will increase 6MWT by at least 98m (143ft) in order to demonstrate clinically significant improvement in cardiopulmonary endurance and community ambulation    Baseline 06/12/19: 605' no asssitive device, CGA    Time 12    Period Weeks    Status New    Target Date 09/04/19                 Plan - 07/17/19 0941    Clinical Impression Statement Continued with POC as previously laid out. HR irregular this date, with more variable RVR, more consistent tachycardia in 140s during exercise. 5xSTS demonstrates significant improvement since eval and achievement of LTgoal. 3MWT does not demonstrate any improvement or progress toward LT goal. Pt able to complete entire  session as planned with rest breaks provided as needed. Pt maintains high level of focus and motivation. Extensive verbal, visual, and tactile cues are provided for most accurate form possible. Author provides minA intermittently for full ROM when needed. Overall pt continues to make steady progress toward treatment goals.    Personal Factors and Comorbidities Age;Comorbidity 3+    Comorbidities CHF, CAD, OA, HTN, DM  Examination-Activity Limitations Locomotion Level;Bend;Stairs;Squat    Examination-Participation Restrictions Community Activity;Yard Work    Merchant navy officer Evolving/Moderate complexity    Clinical Decision Making Moderate    Rehab Potential Good    PT Frequency 2x / week    PT Duration 12 weeks    PT Treatment/Interventions ADLs/Self Care Home Management;Aquatic Therapy;Canalith Repostioning;Cryotherapy;Electrical Stimulation;Iontophoresis 4mg /ml Dexamethasone;Moist Heat;Traction;Ultrasound;Contrast Bath;DME Instruction;Gait training;Stair training;Functional mobility training;Therapeutic activities;Therapeutic exercise;Balance training;Neuromuscular re-education;Patient/family education;Manual techniques;Passive range of motion;Dry needling;Energy conservation;Vestibular;Joint Manipulations    PT Next Visit Plan Continue with strength and balance, eventually add balance exercises to his HEP    PT Home Exercise Plan Medbridge Access Code: [email or web address] (seated clams, seated marches, mini squats, tandem balance)    Consulted and Agree with Plan of Care Patient           Patient will benefit from skilled therapeutic intervention in order to improve the following deficits and impairments:  Decreased strength, Abnormal gait, Decreased balance, Difficulty walking  Visit Diagnosis: Muscle weakness (generalized)  Unsteadiness on feet     Problem List Patient Active Problem List   Diagnosis Date Noted  . Nonischemic cardiomyopathy ([place]) 06/27/2019  . CAD in  native artery 06/27/2019  . Polycythemia 06/27/2019  . Hypothyroidism 05/11/2019  . Gout of multiple sites 05/11/2019  . Type 2 diabetes mellitus ([place]) 04/26/2019  . Hyperlipidemia 04/26/2019  . Acute congestive heart failure ([place]) 04/25/2019  . Essential hypertension 04/25/2019  . OSA on CPAP 04/25/2019  . Class 2 severe obesity due to excess calories with serious comorbidity and body mass index (BMI) of 36.0 to 36.9 in adult [facility]) 04/25/2019   10:16 AM, 07/17/19 Etta Grandchild, PT, DPT Physical Therapist - [facility]  Outpatient Physical Therapy- [place] [phone]     [place] C 07/17/2019, 10:07 AM  [place] MAIN [facility] SERVICES [address] [place], [place], [address] Phone: [phone]   Fax:  [phone]  Name: MONTGOMERY FAVOR MRN: [id number] Date of Birth: 05-14-1950

## 2019-07-18 ENCOUNTER — Ambulatory Visit: Payer: PPO

## 2019-07-22 ENCOUNTER — Ambulatory Visit: Payer: PPO

## 2019-07-22 ENCOUNTER — Ambulatory Visit: Payer: PPO | Admitting: Physical Therapy

## 2019-07-22 ENCOUNTER — Encounter: Payer: Self-pay | Admitting: Physical Therapy

## 2019-07-22 ENCOUNTER — Other Ambulatory Visit: Payer: Self-pay

## 2019-07-22 DIAGNOSIS — M6281 Muscle weakness (generalized): Secondary | ICD-10-CM | POA: Diagnosis not present

## 2019-07-22 DIAGNOSIS — R2681 Unsteadiness on feet: Secondary | ICD-10-CM

## 2019-07-22 NOTE — Therapy (Signed)
[place] MAIN [facility] SERVICES [address] [place], [place], [address] Phone: [phone]   Fax:  [phone]  Physical Therapy Treatment  Patient Details  Name: Blake West MRN: [id number] Date of Birth: 06/16/1950 Referring Provider (PT): Denice Paradise   Encounter Date: 07/22/2019   PT End of Session - 07/22/19 0852    Visit Number 9    Number of Visits 25    Date for PT Re-Evaluation 09/04/19    Authorization Type eval: 06/12/19    PT Start Time 0845    PT Stop Time 0925    PT Time Calculation (min) 40 min    Equipment Utilized During Treatment Gait belt    Activity Tolerance Patient tolerated treatment well;No increased pain    Behavior During Therapy WFL for tasks assessed/performed           Past Medical History:  Diagnosis Date  . Abnormal EKG   . Biventricular failure ([place])   . CAD (coronary artery disease)   . Colorectal polyps   . Gout   . Hyperlipidemia LDL goal <70   . Hypertension   . Hypothyroidism   . Kidney stone   . Pneumonia   . Testicular hypofunction   . Type 2 diabetes mellitus ([place]) 04/26/2019    Past Surgical History:  Procedure Laterality Date  . CARDIAC CATHETERIZATION    . FOOT SURGERY    . RIGHT/LEFT HEART CATH AND CORONARY ANGIOGRAPHY N/A 04/30/2019   Procedure: RIGHT/LEFT HEART CATH AND CORONARY ANGIOGRAPHY;  Surgeon: Troy Sine, MD;  Location: [place] [facility];  Service: Cardiovascular;  Laterality: N/A;    There were no vitals filed for this visit.   Subjective Assessment - 07/22/19 0847    Subjective Pt in pain today.. Did not get a chance to go to [facility] to be set up for silver sneakers.    Patient is accompained by: Family member   Wife stays in waiting room   Pertinent History Blake West is a 69 y.o. male with history of essential hypertension, hypothyroidism, hemochromatosis, gout, sleep apnea ordered CPAP but not using, obesity, hyperlipidemia, newly diagnosed diabetes  mellitus, who reports 2 weeks of deconditioning/DOE followed by an [place] from 04/25/19 to 05/01/19 for new onset CHF with newly diagnosed cardiomyopathy felt to be nonischemic with moderate CAD. He does not see physicians secondary to his wife is Native Optometrist and a Theatre stage manager and they prefer holistic medicine approach. Cardiology recommended starting ACE inhibitor/ARB/MRA/ARNI. He recommended referral to cardiac lab, schedule cardiac MRI, check BMP and CHF education. Echo on 04/26/2019, showed a new cardiomyopathy with an EF of 20-25%. Diagnostic cath showed moderate CAD, with recommendation to continue medical management and risk factor modification.  He is on aspirin and metoprolol.  He has declined statin therapy. Former smoker quit date 2004 never used smokeless tobacco. During admission he was IV diuresed with improvement in symptoms with a weight loss of 260 lb to 225 lb at discharge, with at least 21L net negative for the admission. He was referred for physical therapy due to physical deconditioning. Pt reports that he has had bilateral knee pain for a while as well as difficulty with his balance. He complains of bilateral lower extremity tingling in stocking distribution up to just above the ankles.    Limitations Walking    Patient Stated Goals "To walk again, maybe even run"    Currently in Pain? Yes    Pain Score 6  Pain Location Knee    Pain Orientation Right;Left    Pain Descriptors / Indicators Aching    Pain Type Chronic pain    Pain Radiating Towards NA    Pain Onset More than a month ago    Pain Frequency Constant    Aggravating Factors  walking    Pain Relieving Factors rest    Effect of Pain on Daily Activities slower           Ther-ex  Octane fitness x 5 mins 2 sets of 20 reps BLE  Hip flexion marches with 2.5# ankle weights  Hip abduction with 2.5# HS curls with 2.5# AW Hip extension with 2.5 #  Lunges to BOSU ball  Heel raises  Step ups to 6-inch  stool 1/2 foam flat side up and ankle heel/toe rock x 2 mins 1/2 foam tandem stand flat side down x 1 min   Patient needs occasional verbal cueing to improve posture and cueing to correctly perform exercises slowly, holding at end of range to increase motor firing of desired muscle to encourage fatigue.                           PT Education - 07/22/19 0852    Education Details HEP    Person(s) Educated Patient    Methods Explanation    Comprehension Verbalized understanding            PT Short Term Goals - 06/12/19 1140      PT SHORT TERM GOAL #1   Title Pt will be independent with HEP in order to improve strength and balance in order to decrease fall risk and improve function at home and work.    Time 6    Period Weeks    Status New    Target Date 07/24/19             PT Long Term Goals - 06/12/19 1140      PT LONG TERM GOAL #1   Title Pt will improve BERG by at least 3 points in order to demonstrate clinically significant improvement in balance.    Baseline 06/12/19: 51/56    Time 12    Period Weeks    Status New    Target Date 06/12/19      PT LONG TERM GOAL #2   Title Pt will decrease 5TSTS by at least 3 seconds in order to demonstrate clinically significant improvement in LE strength.    Baseline 06/12/19: 19.7s    Time 12    Period Weeks    Status New    Target Date 09/04/19      PT LONG TERM GOAL #3   Title Pt will decrease TUG to below 14 seconds/decrease in order to demonstrate decreased fall risk.    Baseline 06/12/19: 15.8s    Time 12    Period Weeks    Status New    Target Date 09/04/19      PT LONG TERM GOAL #4   Title Pt will increase 6MWT by at least 11m (138ft) in order to demonstrate clinically significant improvement in cardiopulmonary endurance and community ambulation    Baseline 06/12/19: 605' no asssitive device, CGA    Time 12    Period Weeks    Status New    Target Date 09/04/19                 Plan -  07/22/19 0852    Clinical  Impression Statement Patient instructed in intermediate balance/strengthening exercise.Patient has B knee pain and B ankle pain today that limits some of his ability to tolerate exercises.   Patient fatigues quickly requiring short rest breaks in between intermediate exercise. Patient instructed in advanced strengthening with increased repetition/resistance. Patient requires CGA for intermediate balance exercise especially with less rail assist. Patient would benefit from additional skilled PT intervention to improve balance/gait safety and reduce fall risk.   Personal Factors and Comorbidities Age;Comorbidity 3+    Comorbidities CHF, CAD, OA, HTN, DM    Examination-Activity Limitations Locomotion Level;Bend;Stairs;Squat    Examination-Participation Restrictions Community Activity;Yard Work    Merchant navy officer Evolving/Moderate complexity    Rehab Potential Good    PT Frequency 2x / week    PT Duration 12 weeks    PT Treatment/Interventions ADLs/Self Care Home Management;Aquatic Therapy;Canalith Repostioning;Cryotherapy;Electrical Stimulation;Iontophoresis 4mg /ml Dexamethasone;Moist Heat;Traction;Ultrasound;Contrast Bath;DME Instruction;Gait training;Stair training;Functional mobility training;Therapeutic activities;Therapeutic exercise;Balance training;Neuromuscular re-education;Patient/family education;Manual techniques;Passive range of motion;Dry needling;Energy conservation;Vestibular;Joint Manipulations    PT Next Visit Plan Continue with strength and balance, eventually add balance exercises to his HEP    PT Home Exercise Plan Medbridge Access Code: [email or web address] (seated clams, seated marches, mini squats, tandem balance)    Consulted and Agree with Plan of Care Patient           Patient will benefit from skilled therapeutic intervention in order to improve the following deficits and impairments:  Decreased strength, Abnormal gait, Decreased balance,  Difficulty walking  Visit Diagnosis: Muscle weakness (generalized)  Unsteadiness on feet     Problem List Patient Active Problem List   Diagnosis Date Noted  . Nonischemic cardiomyopathy ([place]) 06/27/2019  . CAD in native artery 06/27/2019  . Polycythemia 06/27/2019  . Hypothyroidism 05/11/2019  . Gout of multiple sites 05/11/2019  . Type 2 diabetes mellitus ([place]) 04/26/2019  . Hyperlipidemia 04/26/2019  . Acute congestive heart failure ([place]) 04/25/2019  . Essential hypertension 04/25/2019  . OSA on CPAP 04/25/2019  . Class 2 severe obesity due to excess calories with serious comorbidity and body mass index (BMI) of 36.0 to 36.9 in adult [facility]) 04/25/2019    Alanson Puls, PT DPT 07/22/2019, 8:53 AM  [place] MAIN [facility] SERVICES [place], [place], [address] Phone: [phone]   Fax:  [phone]  Name: Blake West MRN: [id number] Date of Birth: Dec 17, 1950

## 2019-07-24 ENCOUNTER — Ambulatory Visit: Payer: PPO | Admitting: Physical Therapy

## 2019-07-24 ENCOUNTER — Encounter: Payer: Self-pay | Admitting: Physical Therapy

## 2019-07-24 ENCOUNTER — Ambulatory Visit: Payer: PPO

## 2019-07-24 ENCOUNTER — Other Ambulatory Visit: Payer: Self-pay

## 2019-07-24 DIAGNOSIS — M6281 Muscle weakness (generalized): Secondary | ICD-10-CM

## 2019-07-24 DIAGNOSIS — R2681 Unsteadiness on feet: Secondary | ICD-10-CM

## 2019-07-24 NOTE — Therapy (Signed)
[name] [facility] SERVICES [address] [place], [place], [address] Phone: [phone]   Fax:  [phone]  Physical Therapy Treatment Physical Therapy Progress Note   Dates of reporting period  06/12/19   to  07/24/19  Patient Details  Name: Blake West MRN: [id number] Date of Birth: 05-14-1950 Referring Provider (PT): Denice Paradise   Encounter Date: 07/24/2019   PT End of Session - 07/24/19 0850    Visit Number 10    Number of Visits 25    Date for PT Re-Evaluation 09/04/19    Authorization Type eval: 06/12/19    PT Start Time 0845    PT Stop Time 0925    PT Time Calculation (min) 40 min    Equipment Utilized During Treatment Gait belt    Activity Tolerance Patient tolerated treatment well;No increased pain    Behavior During Therapy WFL for tasks assessed/performed           Past Medical History:  Diagnosis Date  . Abnormal EKG   . Biventricular failure ([place])   . CAD (coronary artery disease)   . Colorectal polyps   . Gout   . Hyperlipidemia LDL goal <70   . Hypertension   . Hypothyroidism   . Kidney stone   . Pneumonia   . Testicular hypofunction   . Type 2 diabetes mellitus ([place]) 04/26/2019    Past Surgical History:  Procedure Laterality Date  . CARDIAC CATHETERIZATION    . FOOT SURGERY    . RIGHT/LEFT HEART CATH AND CORONARY ANGIOGRAPHY N/A 04/30/2019   Procedure: RIGHT/LEFT HEART CATH AND CORONARY ANGIOGRAPHY;  Surgeon: Troy Sine, MD;  Location: [place] [facility];  Service: Cardiovascular;  Laterality: N/A;    There were no vitals filed for this visit.   Subjective Assessment - 07/24/19 0849    Subjective Pt in pain today.. Did not get a chance to go to [facility] to be set up for silver sneakers.    Patient is accompained by: Family member   Wife stays in waiting room   Pertinent History Blake West is a 69 y.o. male with history of essential hypertension, hypothyroidism, hemochromatosis, gout, sleep apnea  ordered CPAP but not using, obesity, hyperlipidemia, newly diagnosed diabetes mellitus, who reports 2 weeks of deconditioning/DOE followed by an [place] from 04/25/19 to 05/01/19 for new onset CHF with newly diagnosed cardiomyopathy felt to be nonischemic with moderate CAD. He does not see physicians secondary to his wife is Native Optometrist and a Theatre stage manager and they prefer holistic medicine approach. Cardiology recommended starting ACE inhibitor/ARB/MRA/ARNI. He recommended referral to cardiac lab, schedule cardiac MRI, check BMP and CHF education. Echo on 04/26/2019, showed a new cardiomyopathy with an EF of 20-25%. Diagnostic cath showed moderate CAD, with recommendation to continue medical management and risk factor modification.  He is on aspirin and metoprolol.  He has declined statin therapy. Former smoker quit date 2004 never used smokeless tobacco. During admission he was IV diuresed with improvement in symptoms with a weight loss of 260 lb to 225 lb at discharge, with at least 21L net negative for the admission. He was referred for physical therapy due to physical deconditioning. Pt reports that he has had bilateral knee pain for a while as well as difficulty with his balance. He complains of bilateral lower extremity tingling in stocking distribution up to just above the ankles.    Limitations Walking    Patient Stated Goals "To walk again, maybe  even run"    Currently in Pain? Yes    Pain Score 8     Pain Location Knee    Pain Orientation Right;Left    Pain Descriptors / Indicators Aching    Pain Onset More than a month ago              [facility] PT Assessment - 07/24/19 0001      Berg Balance Test   Sit to Stand Able to stand without using hands and stabilize independently    Standing Unsupported Able to stand safely 2 minutes    Sitting with Back Unsupported but Feet Supported on Floor or Stool Able to sit safely and securely 2 minutes    Stand to Sit Sits safely with minimal use  of hands    Transfers Able to transfer safely, minor use of hands    Standing Unsupported with Eyes Closed Able to stand 10 seconds safely    Standing Unsupported with Feet Together Able to place feet together independently and stand 1 minute safely    From Standing, Reach Forward with Outstretched Arm Can reach forward >12 cm safely (5")    From Standing Position, Pick up Object from Floor Able to pick up shoe safely and easily    From Standing Position, Turn to Look Behind Over each Shoulder Looks behind from both sides and weight shifts well    Turn 360 Degrees Able to turn 360 degrees safely in 4 seconds or less    Standing Unsupported, Alternately Place Feet on Step/Stool Able to stand independently and safely and complete 8 steps in 20 seconds    Standing Unsupported, One Foot in Front Able to place foot tandem independently and hold 30 seconds    Standing on One Leg Tries to lift leg/unable to hold 3 seconds but remains standing independently    Total Score 52             Treatment: Patient performed outcome measures for goal review and progress. Added a new STG for FOTO score. Therapeutic exercises: Octane fitness x 5 mins L 4 sidelying hip abd 10 x 2 BLE  sidleying hip flex/ext x 10 x 2  Prone hip extension x 10 x 2  Patient is having B knee pain during treatment session today and his pain got worse while using the octane fitness for warm up.   Patient performed with instruction, verbal cues, tactile cues of therapist: goal: increase tissue extensibility, promote proper posture, improve mobility                     PT Short Term Goals - 07/24/19 0850      PT SHORT TERM GOAL #1   Title Pt will be independent with HEP in order to improve strength and balance in order to decrease fall risk and improve function at home and work.    Time 6    Period Weeks    Status On-going    Target Date 08/29/19      PT SHORT TERM GOAL #2   Title Patients FOTO score will  improve by 5 points to indicate improved functional mobility.    Time 6    Period Weeks    Status New    Target Date 09/04/19             PT Long Term Goals - 07/24/19 0851      PT LONG TERM GOAL #1   Title Pt will improve BERG by at  least 3 points in order to demonstrate clinically significant improvement in balance.    Baseline 06/12/19: 51/56, 07/24/19=52/56    Time 12    Period Weeks    Status On-going    Target Date 09/04/19      PT LONG TERM GOAL #2   Title Pt will decrease 5TSTS by at least 3 seconds in order to demonstrate clinically significant improvement in LE strength.    Baseline 06/12/19: 19.7s, 07/24/19=15.41    Time 12    Period Weeks    Status On-going    Target Date 09/04/19      PT LONG TERM GOAL #3   Title Pt will decrease TUG to below 14 seconds/decrease in order to demonstrate decreased fall risk.    Baseline 06/12/19: 15.8s, 07/24/19=14.51 sec    Time 12    Period Weeks    Status On-going    Target Date 09/04/19      PT LONG TERM GOAL #4   Title Pt will increase 6MWT by at least 35m (184ft) in order to demonstrate clinically significant improvement in cardiopulmonary endurance and community ambulation    Baseline 06/12/19: 605' no asssitive device, CGA, 07/24/19=700 feet    Time 12    Period Weeks    Status On-going    Target Date 09/04/19                 Plan - 07/24/19 0850    Clinical Impression Statement Patient's condition has the potential to improve in response to therapy. Maximum improvement is yet to be obtained. The anticipated improvement is attainable and reasonable in a generally predictable time.  Patient reports that his functional mobility depends on how active he is during the day. His outcome measures improved and his goals were reviewed and progressed. Patient will continue to benefit from skilled PT to improve mobility.   Personal Factors and Comorbidities Age;Comorbidity 3+    Comorbidities CHF, CAD, OA, HTN, DM     Examination-Activity Limitations Locomotion Level;Bend;Stairs;Squat    Examination-Participation Restrictions Community Activity;Yard Work    Merchant navy officer Evolving/Moderate complexity    Rehab Potential Good    PT Frequency 2x / week    PT Duration 12 weeks    PT Treatment/Interventions ADLs/Self Care Home Management;Aquatic Therapy;Canalith Repostioning;Cryotherapy;Electrical Stimulation;Iontophoresis 4mg /ml Dexamethasone;Moist Heat;Traction;Ultrasound;Contrast Bath;DME Instruction;Gait training;Stair training;Functional mobility training;Therapeutic activities;Therapeutic exercise;Balance training;Neuromuscular re-education;Patient/family education;Manual techniques;Passive range of motion;Dry needling;Energy conservation;Vestibular;Joint Manipulations    PT Next Visit Plan Continue with strength and balance, eventually add balance exercises to his HEP    PT Home Exercise Plan Medbridge Access Code: [email or web address] (seated clams, seated marches, mini squats, tandem balance)    Consulted and Agree with Plan of Care Patient           Patient will benefit from skilled therapeutic intervention in order to improve the following deficits and impairments:  Decreased strength, Abnormal gait, Decreased balance, Difficulty walking  Visit Diagnosis: Muscle weakness (generalized)  Unsteadiness on feet     Problem List Patient Active Problem List   Diagnosis Date Noted  . Nonischemic cardiomyopathy ([place]) 06/27/2019  . CAD in native artery 06/27/2019  . Polycythemia 06/27/2019  . Hypothyroidism 05/11/2019  . Gout of multiple sites 05/11/2019  . Type 2 diabetes mellitus ([place]) 04/26/2019  . Hyperlipidemia 04/26/2019  . Acute congestive heart failure ([place]) 04/25/2019  . Essential hypertension 04/25/2019  . OSA on CPAP 04/25/2019  . Class 2 severe obesity due to excess calories with serious comorbidity and body mass index (BMI)  of 36.0 to 36.9 in adult [facility]) 04/25/2019     Alanson Puls, PT DPT 07/24/2019, 9:21 AM  [place] MAIN [facility] SERVICES [place], [place], [address] Phone: [phone]   Fax:  [phone]  Name: Blake West MRN: [id number] Date of Birth: Jun 25, 1950

## 2019-07-26 ENCOUNTER — Telehealth: Payer: Self-pay

## 2019-07-26 ENCOUNTER — Ambulatory Visit: Payer: PPO | Admitting: Family

## 2019-07-26 NOTE — Telephone Encounter (Signed)
Call to patient for well check as he missed appt today.   He reports that he is doing okay but had scheduling conflicts and forgot to call.   He request wife make f/u appt.   Made call to her. No answer. LMTCB.

## 2019-07-27 ENCOUNTER — Inpatient Hospital Stay (HOSPITAL_COMMUNITY): Admission: RE | Admit: 2019-07-27 | Payer: PPO | Source: Ambulatory Visit

## 2019-07-30 ENCOUNTER — Ambulatory Visit: Payer: PPO | Admitting: Pulmonary Disease

## 2019-07-30 ENCOUNTER — Encounter: Payer: Self-pay | Admitting: Family

## 2019-07-31 ENCOUNTER — Ambulatory Visit: Payer: PPO

## 2019-07-31 ENCOUNTER — Ambulatory Visit: Payer: PPO | Admitting: Physical Therapy

## 2019-08-01 ENCOUNTER — Telehealth: Payer: Self-pay | Admitting: Nurse Practitioner

## 2019-08-01 NOTE — Telephone Encounter (Signed)
Copied from [place] [id number]. Topic: Medicare AWV >> Aug 01, 2019 12:33 PM Cher Nakai R wrote: Reason for CRM:  Left message for patient to call back and schedule Medicare Annual Wellness Visit (AWV) either virtually or audio only.  No hx of AWV; please schedule at anytime with Denisa O'Brien-Blaney at [facility]

## 2019-08-05 ENCOUNTER — Ambulatory Visit: Payer: PPO

## 2019-08-05 ENCOUNTER — Ambulatory Visit: Payer: PPO | Admitting: Physical Therapy

## 2019-08-07 ENCOUNTER — Ambulatory Visit: Payer: PPO | Admitting: Physical Therapy

## 2019-08-07 ENCOUNTER — Ambulatory Visit: Payer: PPO

## 2019-08-08 ENCOUNTER — Ambulatory Visit (INDEPENDENT_AMBULATORY_CARE_PROVIDER_SITE_OTHER): Payer: PPO | Admitting: Nurse Practitioner

## 2019-08-08 ENCOUNTER — Other Ambulatory Visit: Payer: Self-pay

## 2019-08-08 VITALS — BP 126/78 | HR 84 | Temp 98.2°F | Resp 16 | Ht 71.0 in | Wt 236.0 lb

## 2019-08-08 DIAGNOSIS — I428 Other cardiomyopathies: Secondary | ICD-10-CM | POA: Diagnosis not present

## 2019-08-08 DIAGNOSIS — E559 Vitamin D deficiency, unspecified: Secondary | ICD-10-CM | POA: Diagnosis not present

## 2019-08-08 DIAGNOSIS — I10 Essential (primary) hypertension: Secondary | ICD-10-CM | POA: Diagnosis not present

## 2019-08-08 DIAGNOSIS — Z6836 Body mass index (BMI) 36.0-36.9, adult: Secondary | ICD-10-CM | POA: Diagnosis not present

## 2019-08-08 DIAGNOSIS — E782 Mixed hyperlipidemia: Secondary | ICD-10-CM | POA: Diagnosis not present

## 2019-08-08 DIAGNOSIS — I5022 Chronic systolic (congestive) heart failure: Secondary | ICD-10-CM

## 2019-08-08 DIAGNOSIS — E039 Hypothyroidism, unspecified: Secondary | ICD-10-CM

## 2019-08-08 DIAGNOSIS — G4733 Obstructive sleep apnea (adult) (pediatric): Secondary | ICD-10-CM | POA: Diagnosis not present

## 2019-08-08 DIAGNOSIS — Z9989 Dependence on other enabling machines and devices: Secondary | ICD-10-CM

## 2019-08-08 LAB — COMPREHENSIVE METABOLIC PANEL
ALT: 24 U/L (ref 0–53)
AST: 22 U/L (ref 0–37)
Albumin: 4.3 g/dL (ref 3.5–5.2)
Alkaline Phosphatase: 43 U/L (ref 39–117)
BUN: 19 mg/dL (ref 6–23)
CO2: 26 mEq/L (ref 19–32)
Calcium: 9.4 mg/dL (ref 8.4–10.5)
Chloride: 103 mEq/L (ref 96–112)
Creatinine, Ser: 0.96 mg/dL (ref 0.40–1.50)
GFR: 77.57 mL/min (ref >60.00)
Glucose, Bld: 91 mg/dL (ref 70–99)
Potassium: 4.5 mEq/L (ref 3.5–5.1)
Sodium: 138 mEq/L (ref 135–145)
Total Bilirubin: 1.1 mg/dL (ref 0.2–1.2)
Total Protein: 7.5 g/dL (ref 6.0–8.3)

## 2019-08-08 LAB — CBC WITH DIFFERENTIAL/PLATELET
Basophils Absolute: 0.1 10*3/uL (ref 0.0–0.1)
Basophils Relative: 1.3 % (ref 0.0–3.0)
Eosinophils Absolute: 0.5 10*3/uL (ref 0.0–0.7)
Eosinophils Relative: 5 % (ref 0.0–5.0)
HCT: 46 % (ref 39.0–52.0)
Hemoglobin: 15.6 g/dL (ref 13.0–17.0)
Lymphocytes Relative: 30.1 % (ref 12.0–46.0)
Lymphs Abs: 2.9 10*3/uL (ref 0.7–4.0)
MCHC: 33.8 g/dL (ref 30.0–36.0)
MCV: 88.4 fl (ref 78.0–100.0)
Monocytes Absolute: 0.9 10*3/uL (ref 0.1–1.0)
Monocytes Relative: 9.2 % (ref 3.0–12.0)
Neutro Abs: 5.3 10*3/uL (ref 1.4–7.7)
Neutrophils Relative %: 54.4 % (ref 43.0–77.0)
Platelets: 292 10*3/uL (ref 150.0–400.0)
RBC: 5.2 Mil/uL (ref 4.22–5.81)
RDW: 17.2 % — ABNORMAL HIGH (ref 11.5–15.5)
WBC: 9.8 10*3/uL (ref 4.0–10.5)

## 2019-08-08 LAB — TSH: TSH: 11.47 u[IU]/mL — ABNORMAL HIGH (ref 0.35–4.50)

## 2019-08-08 LAB — LIPID PANEL
Cholesterol: 223 mg/dL — ABNORMAL HIGH (ref 0–200)
HDL: 32.6 mg/dL — ABNORMAL LOW (ref >39.00)
LDL Cholesterol: 162 mg/dL — ABNORMAL HIGH (ref 0–99)
NonHDL: 190.48
Total CHOL/HDL Ratio: 7
Triglycerides: 142 mg/dL (ref 0.0–149.0)
VLDL: 28.4 mg/dL (ref 0.0–40.0)

## 2019-08-08 LAB — HEMOGLOBIN A1C: Hgb A1c MFr Bld: 6.6 % — ABNORMAL HIGH (ref 4.6–6.5)

## 2019-08-08 LAB — VITAMIN D 25 HYDROXY (VIT D DEFICIENCY, FRACTURES): VITD: 31.58 ng/mL (ref 30.00–100.00)

## 2019-08-08 LAB — T4, FREE: Free T4: 0.66 ng/dL (ref 0.60–1.60)

## 2019-08-08 MED ORDER — FUROSEMIDE 80 MG PO TABS: 80.0000 mg | ORAL_TABLET | Freq: Every day | ORAL | 0 refills | Status: DC

## 2019-08-08 MED ORDER — METOPROLOL SUCCINATE ER 25 MG PO TB24: 25.0000 mg | ORAL_TABLET | Freq: Every day | ORAL | 0 refills | Status: DC

## 2019-08-08 NOTE — Progress Notes (Signed)
Established Patient Office Visit  Subjective:  Patient ID: Blake West, male    DOB: 12/12/1950  Age: 69 y.o. MRN: [id number]  CC:  Chief Complaint  Patient presents with  . Follow-up    HPI Blake West 69 yo who established care 05/09/2019 with history of essential hypertension, untreated hypothyroidism, sleep apnea, obesity, hyperlipidemia, gout,  diabetes mellitus and recent acute biventricular failure with newly diagnosed cardiomyopathy felt secondary to nonischemic with moderate CAD.  He has been followed by Cardiology-missed recent appt by accident and will reschedule.  He has completed cardiac rehab but did not find it helpful.  He  and his wife believe and natural holistic medicine approach and have declined evidence-based medicine for hypothyroidism, hyperlipidemia, and diabetes mellitus.  They had a concern about polycythemia and hemochromatosis since it ran in the family,but specialty testing showed negative findings.  He declines repeat colonoscopy for personal history of colon polyps.  This is his second office visit with me.  CHF/Essential hypertension: He ran out of Lasix  and metoprolol .  Weight is up 5 pounds.  He denies any chest pain, pressure or heaviness, shortness of breath or DOE.  He does have a pulse ox at home and reports his pulse runs 70-100 and the oxygen saturations 92% to 95%.  Negative  microalbuminemia in April.  He maintains normal ADLs.  He has been working outside, up and down the short ladders.  He reports his stamina is at baseline.  He plans to start swimming for exercise.  BP Readings from Last 3 Encounters:  08/08/19 126/78  06/26/19 104/72  06/17/19 122/83   Hypothyroidism: Not at goal.  Declines medication therapy He is treating this with whole foods.  He says he feels well without skin there are hair changes.  He denies any cold or heat intolerance.  T2DM: He declines medication therapy.  Limited with exercise as he reports a cardiac rehab seem  to hurt his knees. But,  he loves to swim and that will give him exercise without the joint discomfort.  No active gout flares.  HLD/ BMI 32.9/Obesity: Eating a healthy diet.   Immunizations:He did accept a Td vaccine when he cut his hand in April, but he has declined any further vaccinations.Declines all routine age recommended  Vaccines including PNA, shingles. Declines Covid vaccine: Exercise: Colonoscopy: 2018- told good years  Dexa: Pap Smear: Mammogram: Vision: overdue Dentist: dentures   Past Medical History:  Diagnosis Date  . Abnormal EKG   . Biventricular failure ([place])   . CAD (coronary artery disease)   . Colorectal polyps   . Gout   . Hyperlipidemia LDL goal <70   . Hypertension   . Hypothyroidism   . Kidney stone   . Pneumonia   . Testicular hypofunction   . Type 2 diabetes mellitus ([place]) 04/26/2019    Past Surgical History:  Procedure Laterality Date  . CARDIAC CATHETERIZATION    . FOOT SURGERY    . RIGHT/LEFT HEART CATH AND CORONARY ANGIOGRAPHY N/A 04/30/2019   Procedure: RIGHT/LEFT HEART CATH AND CORONARY ANGIOGRAPHY;  Surgeon: Troy Sine, MD;  Location: [facility];  Service: Cardiovascular;  Laterality: N/A;    Family History  Problem Relation Age of Onset  . Heart attack Mother 38  . Pulmonary fibrosis Father   . Congestive Heart Failure Brother   . Heart attack Brother   . Heart attack Brother   . Early death Sister   . Kidney disease Paternal  Grandfather   . Diabetes Sister     Social History   Socioeconomic History  . Marital status: Married    Spouse name: Not on file  . Number of children: Not on file  . Years of education: Not on file  . Highest education level: Not on file  Occupational History  . Occupation: Chief Financial Officer  Tobacco Use  . Smoking status: Former Smoker    Packs/day: 1.00    Years: 30.00    Pack years: 30.00    Types: Cigarettes    Quit date: 01/24/2002    Years since quitting: 17.5  .  Smokeless tobacco: Never Used  Vaping Use  . Vaping Use: Never used  Substance and Sexual Activity  . Alcohol use: No  . Drug use: No  . Sexual activity: Not on file  Other Topics Concern  . Not on file  Social History Narrative  . Not on file   Social Determinants of Health   Financial Resource Strain:   . Difficulty of Paying Living Expenses:   Food Insecurity:   . Worried About Charity fundraiser in the Last Year:   . Arboriculturist in the Last Year:   Transportation Needs:   . Film/video editor (Medical):   Marland Kitchen Lack of Transportation (Non-Medical):   Physical Activity:   . Days of Exercise per Week:   . Minutes of Exercise per Session:   Stress:   . Feeling of Stress :   Social Connections:   . Frequency of Communication with Friends and Family:   . Frequency of Social Gatherings with Friends and Family:   . Attends Religious Services:   . Active Member of Clubs or Organizations:   . Attends Archivist Meetings:   Marland Kitchen Marital Status:   Intimate Partner Violence:   . Fear of Current or Ex-Partner:   . Emotionally Abused:   Marland Kitchen Physically Abused:   . Sexually Abused:     Outpatient Medications Prior to Visit  Medication Sig Dispense Refill  . Ascorbic Acid (VITAMIN C) 100 MG tablet Take 500 mg by mouth daily.    . cholecalciferol (VITAMIN D) 400 UNITS TABS tablet Take 400 Units by mouth 3 (three) times daily.     . Multiple Vitamin (THERA) TABS Take 1 tablet by mouth daily.    . NON FORMULARY Take 1 capsule by mouth with breakfast, with lunch, and with evening meal. Alphalplex    . OVER THE COUNTER MEDICATION Thyroid otc medicine Simplex m    . spironolactone (ALDACTONE) 25 MG tablet Take 0.5 tablets (12.5 mg total) by mouth daily. 15 tablet 5  . Vitamin E 400 units TABS Take 400 Units by mouth 2 (two) times daily.     . furosemide (LASIX) 80 MG tablet Take 1 tablet (80 mg total) by mouth daily. 90 tablet 0  . metoprolol succinate (TOPROL-XL) 25 MG 24  hr tablet Take 1 tablet (25 mg total) by mouth daily. 90 tablet 0   No facility-administered medications prior to visit.    Allergies  Allergen Reactions  . Codeine     Joints hurt     ROS Review of Systems  Constitutional: Negative for appetite change, chills and fever.  HENT: Negative for congestion and sinus pressure.   Eyes: Negative.   Respiratory: Positive for shortness of breath. Negative for cough.   Cardiovascular: Positive for leg swelling. Negative for chest pain and palpitations.  Gastrointestinal: Negative for abdominal pain, blood in  stool, constipation, diarrhea, nausea, rectal pain and vomiting.  Endocrine: Negative.   Genitourinary: Negative for difficulty urinating.  Musculoskeletal: Negative for back pain.       Ankle  and knees arthritis- bow legged- bought specific SAS tennis shoes- with inserts and helps his gait.  Dr. Mallie Mussel  is in in  Podiatry did surgery on his left foot.     Allergic/Immunologic: Negative.   Neurological: Negative for dizziness, syncope and light-headedness.  Hematological: Negative for adenopathy. Bruises/bleeds easily.  Psychiatric/Behavioral: Negative.       Objective:    Physical Exam Vitals reviewed.  Constitutional:      Appearance: Normal appearance.  HENT:     Head: Normocephalic.  Eyes:     Pupils: Pupils are equal, round, and reactive to light.  Cardiovascular:     Rate and Rhythm: Normal rate and regular rhythm.     Pulses: Normal pulses.     Heart sounds: Normal heart sounds.  Pulmonary:     Effort: Pulmonary effort is normal.     Breath sounds: Normal breath sounds.  Abdominal:     Palpations: Abdomen is soft.     Tenderness: There is no abdominal tenderness.  Musculoskeletal:        General: Swelling present. Normal range of motion.     Cervical back: Normal range of motion and neck supple.  Skin:    General: Skin is warm and dry.  Neurological:     General: No focal deficit present.     Mental Status: He  is alert and oriented to person, place, and time.  Psychiatric:        Mood and Affect: Mood normal.        Behavior: Behavior normal.     Comments: No concerns about depression and anxiety.     BP 126/78   Pulse 84   Temp 98.2 F (36.8 C)   Resp 16   Ht 5\' 11"  (1.803 m)   Wt 236 lb (107 kg)   SpO2 96%   BMI 32.92 kg/m  Wt Readings from Last 3 Encounters:  08/08/19 236 lb (107 kg)  06/26/19 233 lb (105.7 kg)  06/04/19 232 lb 9.6 oz (105.5 kg)     Health Maintenance Due  Topic Date Due  . FOOT EXAM  Never done  . OPHTHALMOLOGY EXAM  Never done  . COLONOSCOPY  Never done    There are no preventive care reminders to display for this patient.  Lab Results  Component Value Date   TSH 11.47 (H) 08/08/2019   Lab Results  Component Value Date   WBC 9.8 08/08/2019   HGB 15.6 08/08/2019   HCT 46.0 08/08/2019   MCV 88.4 08/08/2019   PLT 292.0 08/08/2019   Lab Results  Component Value Date   NA 138 08/08/2019   K 4.5 08/08/2019   CO2 26 08/08/2019   GLUCOSE 91 08/08/2019   BUN 19 08/08/2019   CREATININE 0.96 08/08/2019   BILITOT 1.1 08/08/2019   ALKPHOS 43 08/08/2019   AST 22 08/08/2019   ALT 24 08/08/2019   PROT 7.5 08/08/2019   ALBUMIN 4.3 08/08/2019   CALCIUM 9.4 08/08/2019   ANIONGAP 10 07/05/2019   GFR 77.57 08/08/2019   Lab Results  Component Value Date   CHOL 223 (H) 08/08/2019   Lab Results  Component Value Date   HDL 32.60 (L) 08/08/2019   Lab Results  Component Value Date   LDLCALC 162 (H) 08/08/2019   Lab Results  Component Value Date   TRIG 142.0 08/08/2019   Lab Results  Component Value Date   CHOLHDL 7 08/08/2019   Lab Results  Component Value Date   HGBA1C 6.6 (H) 08/08/2019      Assessment & Plan:   Problem List Items Addressed This Visit      Cardiovascular and Mediastinum   Essential hypertension - Primary (Chronic)   Relevant Medications   metoprolol succinate (TOPROL-XL) 25 MG 24 hr tablet   furosemide (LASIX)  80 MG tablet   Other Relevant Orders   Comprehensive metabolic panel (Completed)   Nonischemic cardiomyopathy (HCC)   Relevant Medications   metoprolol succinate (TOPROL-XL) 25 MG 24 hr tablet   furosemide (LASIX) 80 MG tablet   Chronic HFrEF (heart failure with reduced ejection fraction) (HCC)   Relevant Medications   metoprolol succinate (TOPROL-XL) 25 MG 24 hr tablet   furosemide (LASIX) 80 MG tablet   Other Relevant Orders   CBC with Differential/Platelet (Completed)     Respiratory   OSA on CPAP (Chronic)     Endocrine   Hypothyroidism   Relevant Medications   metoprolol succinate (TOPROL-XL) 25 MG 24 hr tablet   Other Relevant Orders   TSH (Completed)   T4, free (Completed)     Other   Class 2 severe obesity due to excess calories with serious comorbidity and body mass index (BMI) of 36.0 to 36.9 in adult (HCC) (Chronic)   Relevant Orders   Hemoglobin A1c (Completed)   Hyperlipidemia   Relevant Medications   metoprolol succinate (TOPROL-XL) 25 MG 24 hr tablet   furosemide (LASIX) 80 MG tablet   Other Relevant Orders   Lipid panel (Completed)   Vitamin D deficiency   Relevant Orders   VITAMIN D 25 Hydroxy (Vit-D Deficiency, Fractures) (Completed)      Meds ordered this encounter  Medications  . metoprolol succinate (TOPROL-XL) 25 MG 24 hr tablet    Sig: Take 1 tablet (25 mg total) by mouth daily.    Dispense:  90 tablet    Refill:  0    Order Specific Question:   Supervising Provider    Answer:   Einar Pheasant [id number]  . furosemide (LASIX) 80 MG tablet    Sig: Take 1 tablet (80 mg total) by mouth daily.    Dispense:  90 tablet    Refill:  0    Order Specific Question:   Supervising Provider    Answer:   Einar Pheasant [[id number]]   He was advised: To the lab today for routine lab work.  I refilled your metoprolol and I refilled your Lasix.  Please reschedule appointment with cardiology.  I reiterated the importance of having medical therapy for  hypothyroidism, diabetes mellitus, hyperlipidemia with a heart history.  I have recommended endocrinology consult.  He and his wife declined.  Please reschedule your appointment with pulmonology for sleep apnea testing, and pulmonary function studies for shortness of breath.  Follow-up: Return in about 3 months (around 11/08/2019).  This visit occurred during the SARS-CoV-2 public health emergency.  Safety protocols were in place, including screening questions prior to the visit, additional usage of staff PPE, and extensive cleaning of exam room while observing appropriate contact time as indicated for disinfecting solutions.    Denice Paradise, NP

## 2019-08-08 NOTE — Patient Instructions (Addendum)
To the lab today for routine lab work.  I refilled your metoprolol and I refilled your Lasix.  Please reschedule appointment with cardiology.  Please reschedule your appointment with pulmonology for sleep apnea testing, and pulmonary function studies for shortness of breath.  We will recheck thyroid function today.  If it remains abnormal I will recommend endocrinology consult again.  Please follow-up office visit in 3 months.   Preventing Heart Failure Heart failure is a condition in which the heart has trouble pumping blood because it has become weak or stiff. This may mean that the heart cannot pump enough blood out to the body or that the heart does not fill up with enough blood. Either of those problems can lead to symptoms such as tiredness (fatigue), trouble breathing, and swelling throughout the body. This is a common medical condition that affects not only the heart, but the entire body. Making certain nutrition and lifestyle changes can help you prevent heart failure and avoid serious health problems. How can this condition affect me? Heart failure can cause very serious problems that may get worse over time, such as:  Extreme fatigue during normal physical activities.  Shortness of breath or trouble breathing.  Swelling in your abdomen, legs, ankles, feet, or neck.  Fluid buildup throughout the body.  Weight gain.  Cough.  Frequent urination. What can increase my risk? The risk of heart failure increases as a person ages. The following factors may also make you more likely to develop this condition:  Being overweight.  Being male.  Smoking or chewing tobacco.  Abusing alcohol or drugs.  Having taken medicines that can damage the heart, such as chemotherapy drugs.  Having any of these medical conditions: ? Diabetes. ? Abnormal heart rhythms. ? Thyroid problems. ? Low blood counts (anemia). What actions can I take to prevent heart failure?      Nutrition  If you are overweight or obese, reduce how many calories you eat each day so that you lose weight. Work with your health care provider or a diet and nutrition specialist (dietitian) to determine how many calories you need each day.  Eat foods that are low in salt (sodium). Avoid adding extra salt to foods. This can help keep your blood pressure in a normal range.  Eat a well-balanced diet that includes a lot of: ? Fresh fruits and vegetables. ? Whole grains. ? Lean meats. ? Beans. ? Fat-free or low-fat dairy products.  Avoid foods that contain a lot of: ? Trans fats. ? Saturated fats. ? Sugar. ? Cholesterol. Alcohol  Do not drink alcohol if: ? Your health care provider tells you not to drink. ? You are pregnant, may be pregnant, or are planning to become pregnant.  If you drink alcohol: ? Limit how much you use to:  0-1 drink a day for women.  0-2 drinks a day for men. ? Be aware of how much alcohol is in your drink. In the [place] one drink equals one 12 oz bottle of beer (355 mL), one 5 oz glass of wine (148 mL), or one 1 oz glass of hard liquor (44 mL). Lifestyle  Do not use any products that contain nicotine or tobacco, such as cigarettes, e-cigarettes, and chewing tobacco. If you need help quitting, ask your health care provider.  Exercise for at least 150 minutes each week, or as much as told by your health care provider. ? Do moderate-intensity exercise, such as brisk walking, bicycling, or water aerobics. ? Ask your  health care provider which activities are safe for you.  Try to get 7-9 hours of sleep each night. To help with sleep: ? Keep your bedroom cool and dark. ? Do not eat a heavy meal during the hour before you go to bed. ? Do not drink alcohol or caffeinated drinks before bed. ? Avoid screen time before bedtime. This means avoiding television, computers, tablets, and mobile phones.  Find ways to relax and manage stress. These may  include: ? Breathing exercises. ? Meditation. ? Yoga. ? Listening to music. General instructions  See a health care provider regularly for screening and wellness checks. Work with your health care provider to manage your: ? Blood pressure. ? Cholesterol levels. ? Blood sugar (glucose) levels. ? Weight and BMI.  If you have diabetes, manage your condition and follow your treatment plan as instructed. Where to find more information  National Heart, Lung, and Blood Institute: https://wilson-eaton.com/  Centers for Disease Control and Prevention: http://www.wolf.info/  National Institute on Aging: http://kim-miller.com/  American Heart Association: www.heart.org Contact a health care provider if:  You have rapid weight gain.  You have increasing shortness of breath that is unusual for you.  You tire easily or you are unable to participate in your usual activities.  You cough more than normal, especially with physical activity.  You have any swelling or more swelling in areas such as your hands, feet, ankles, or abdomen. Get help right away if you have:  Trouble breathing.  Pain or discomfort in your chest.  An episode of fainting. These symptoms may represent a serious problem that is an emergency. Do not wait to see if the symptoms will go away. Get medical help right away. Call your local emergency services (911 in the [place] Do not drive yourself to the hospital. Summary  Heart failure can cause very serious problems over time.  Heart failure can be prevented by making changes to your diet and your lifestyle.  It is important to eat a healthy diet, manage your weight, exercise regularly, manage stress, avoid drugs and alcohol, and keep your cholesterol and blood pressure under control. This information is not intended to replace advice given to you by your health care provider. Make sure you discuss any questions you have with your health care provider. Document Revised: 08/10/2018 Document  Reviewed: 08/10/2018 Elsevier Patient Education  [place].

## 2019-08-11 ENCOUNTER — Encounter: Payer: Self-pay | Admitting: Nurse Practitioner

## 2019-08-12 ENCOUNTER — Ambulatory Visit: Payer: PPO

## 2019-08-12 ENCOUNTER — Ambulatory Visit: Payer: PPO | Admitting: Physical Therapy

## 2019-08-14 ENCOUNTER — Ambulatory Visit: Payer: PPO

## 2019-08-14 ENCOUNTER — Ambulatory Visit: Payer: PPO | Admitting: Physical Therapy

## 2019-08-19 ENCOUNTER — Ambulatory Visit: Payer: PPO | Admitting: Physical Therapy

## 2019-08-19 ENCOUNTER — Ambulatory Visit: Payer: PPO

## 2019-08-21 ENCOUNTER — Ambulatory Visit: Payer: PPO

## 2019-08-21 ENCOUNTER — Ambulatory Visit: Payer: PPO | Admitting: Physical Therapy

## 2019-08-28 ENCOUNTER — Ambulatory Visit: Payer: PPO | Admitting: Physical Therapy

## 2019-09-02 ENCOUNTER — Ambulatory Visit: Payer: PPO | Admitting: Physical Therapy

## 2019-09-04 ENCOUNTER — Ambulatory Visit: Payer: PPO

## 2019-09-09 ENCOUNTER — Ambulatory Visit: Payer: PPO | Admitting: Physical Therapy

## 2019-09-11 ENCOUNTER — Ambulatory Visit: Payer: PPO

## 2019-09-16 ENCOUNTER — Ambulatory Visit: Payer: PPO | Admitting: Physical Therapy

## 2019-09-18 ENCOUNTER — Ambulatory Visit: Payer: PPO

## 2019-09-23 ENCOUNTER — Ambulatory Visit: Payer: PPO

## 2019-09-25 ENCOUNTER — Ambulatory Visit: Payer: PPO

## 2019-11-08 ENCOUNTER — Other Ambulatory Visit: Payer: Self-pay

## 2019-11-08 ENCOUNTER — Ambulatory Visit (INDEPENDENT_AMBULATORY_CARE_PROVIDER_SITE_OTHER): Payer: PPO

## 2019-11-08 ENCOUNTER — Encounter: Payer: Self-pay | Admitting: Nurse Practitioner

## 2019-11-08 ENCOUNTER — Ambulatory Visit (INDEPENDENT_AMBULATORY_CARE_PROVIDER_SITE_OTHER): Payer: PPO | Admitting: Nurse Practitioner

## 2019-11-08 VITALS — BP 130/72 | HR 91 | Temp 98.5°F | Ht 71.0 in | Wt 246.0 lb

## 2019-11-08 DIAGNOSIS — D72829 Elevated white blood cell count, unspecified: Secondary | ICD-10-CM

## 2019-11-08 DIAGNOSIS — R059 Cough, unspecified: Secondary | ICD-10-CM | POA: Diagnosis not present

## 2019-11-08 DIAGNOSIS — R058 Other specified cough: Secondary | ICD-10-CM | POA: Insufficient documentation

## 2019-11-08 DIAGNOSIS — E7849 Other hyperlipidemia: Secondary | ICD-10-CM | POA: Diagnosis not present

## 2019-11-08 DIAGNOSIS — E039 Hypothyroidism, unspecified: Secondary | ICD-10-CM | POA: Diagnosis not present

## 2019-11-08 DIAGNOSIS — E1159 Type 2 diabetes mellitus with other circulatory complications: Secondary | ICD-10-CM | POA: Diagnosis not present

## 2019-11-08 LAB — COMPREHENSIVE METABOLIC PANEL
ALT: 29 U/L (ref 0–53)
AST: 23 U/L (ref 0–37)
Albumin: 4.1 g/dL (ref 3.5–5.2)
Alkaline Phosphatase: 41 U/L (ref 39–117)
BUN: 15 mg/dL (ref 6–23)
CO2: 26 mEq/L (ref 19–32)
Calcium: 9.5 mg/dL (ref 8.4–10.5)
Chloride: 103 mEq/L (ref 96–112)
Creatinine, Ser: 0.86 mg/dL (ref 0.40–1.50)
GFR: 88.07 mL/min (ref >60.00)
Glucose, Bld: 100 mg/dL — ABNORMAL HIGH (ref 70–99)
Potassium: 4.1 mEq/L (ref 3.5–5.1)
Sodium: 137 mEq/L (ref 135–145)
Total Bilirubin: 0.9 mg/dL (ref 0.2–1.2)
Total Protein: 7.5 g/dL (ref 6.0–8.3)

## 2019-11-08 LAB — LDL CHOLESTEROL, DIRECT: Direct LDL: 150 mg/dL

## 2019-11-08 LAB — CBC WITH DIFFERENTIAL/PLATELET
Basophils Absolute: 0.1 10*3/uL (ref 0.0–0.1)
Basophils Relative: 0.7 % (ref 0.0–3.0)
Eosinophils Absolute: 0.5 10*3/uL (ref 0.0–0.7)
Eosinophils Relative: 4.4 % (ref 0.0–5.0)
HCT: 47.7 % (ref 39.0–52.0)
Hemoglobin: 16 g/dL (ref 13.0–17.0)
Lymphocytes Relative: 23.7 % (ref 12.0–46.0)
Lymphs Abs: 2.9 10*3/uL (ref 0.7–4.0)
MCHC: 33.5 g/dL (ref 30.0–36.0)
MCV: 90.1 fl (ref 78.0–100.0)
Monocytes Absolute: 1 10*3/uL (ref 0.1–1.0)
Monocytes Relative: 8.5 % (ref 3.0–12.0)
Neutro Abs: 7.7 10*3/uL (ref 1.4–7.7)
Neutrophils Relative %: 62.7 % (ref 43.0–77.0)
Platelets: 258 10*3/uL (ref 150.0–400.0)
RBC: 5.3 Mil/uL (ref 4.22–5.81)
RDW: 13.9 % (ref 11.5–15.5)
WBC: 12.4 10*3/uL — ABNORMAL HIGH (ref 4.0–10.5)

## 2019-11-08 LAB — TSH: TSH: 10.14 u[IU]/mL — ABNORMAL HIGH (ref 0.35–4.50)

## 2019-11-08 LAB — LIPID PANEL
Cholesterol: 216 mg/dL — ABNORMAL HIGH (ref 0–200)
HDL: 31.4 mg/dL — ABNORMAL LOW (ref >39.00)
NonHDL: 184.46
Total CHOL/HDL Ratio: 7
Triglycerides: 209 mg/dL — ABNORMAL HIGH (ref 0.0–149.0)
VLDL: 41.8 mg/dL — ABNORMAL HIGH (ref 0.0–40.0)

## 2019-11-08 NOTE — Patient Instructions (Addendum)
Please bring in the names of the supplements that you are taking.  Please go to the x-ray today for cough, and crackles heard in both lung bases.   Please record your oxygen saturations with your pulse oximeter sitting at rest and then walk around and do normal exercise and then check it after exercise.  Bring in those readings to your cardiology appointment.  Continue with healthy diet, low-salt diet, exercise.  For the sore on the left inside ankle from rubbing of shoe, please change shoes,. Monitor for healing of the sore.  He has diabetes and do not want that to turn that into a foot ulcer.  That left foot also has decreased pulse on the foot.  Consider vascular consult in the future.  We will check the cholesterol panel today.  Please follow-up office visit in 3 months.  Diabetes Mellitus and Skin Care Diabetes (diabetes mellitus) can lead to health problems over time, including skin problems. People with diabetes have a higher risk for many types of skin complications. This is because having poorly controlled blood sugar (glucose) levels can:  Damage nerves and blood vessels. This can result in decreased feeling in your legs and feet, which means you may not notice minor skin injuries that could lead to serious problems.  Reduce blood flow (circulation), which makes wounds heal more slowly and increases your risk of infection.  Cause areas of skin to become thick or discolored. What are some common skin conditions that affect people with diabetes? Diabetes often causes dry skin. It can also cause the skin on the feet to get thinner, break more easily, and heal more slowly. There are certain skin conditions that commonly affect people who have diabetes, such as:  Bacterial skin infections, such as styes, boils, infected hair follicles, and infections of the skin around the nails.  Fungal skin infections. These are most common in areas where skin rubs together, such as in the armpits  or under the breasts.  Open sores, especially on the feet.  Tissue death (gangrene). This can happen on your feet if a serious infection does not heal properly. Gangrene can cause the need for a foot or leg to be surgically removed (amputated). Diabetes can also cause the skin to change. You may develop:  Dark, velvety markings on the skin that usually appear on the face, neck, armpits, inner thighs, and groin (acanthosis nigricans). This typically affects people of African-American and American-Indian descent.  Red, raised, scar-like tissue that may itch, feel painful, or develop into a wound (necrobiosis lipoidica).  Blisters on feet, toes, hands, or fingers.  Thickened, wax-like areas of skin that usually occur on the hands, forehead, or toes (digital sclerosis).  Brown or red ring-shaped or half-ring-shaped patches of skin on the ears or fingers (disseminated granuloma).  Pea-shaped yellow bumps that may be itchy and surrounded by a red ring (eruptive xanthomatosis). This usually affects the arms, feet, buttocks, and the top of the hands.  Round, discolored patches of tan skin that do not hurt or itch (diabetic dermopathy). These may look like age spots. What do I need to know about itchy skin? It is common for people with diabetes to have itchy skin caused by dryness. Frequent high blood glucose levels can cause itchiness, and poor circulation and certain skin infections can make dry, itchy skin worse. If you have itchy skin that is red or covered in a rash, this could be a sign of an allergic reaction to a medicine. If you  have a rash or if your skin is very itchy, contact your health care provider. You may need help to manage your diabetes better, or you may need treatment for an infection. How can I prevent skin breakdown? When you have diabetes and you get a badly infected ulcer or sore that does not heal, your skin can break down, especially if you have poor circulation or are on  bed rest. To prevent skin breakdown:  Keep your skin clean and dry. Wash your skin often. Do not use hot water.  Do not use any products that contain nicotine or tobacco, such as cigarettes and e-cigarettes. Smoking affects the body's ability to heal. If you need help quitting, ask your health care provider.  Check your skin every day for cuts, bruises, redness, blisters, or sores, especially on your feet. Tell your health care provider about any cuts, wounds, or sores you have, especially if they are healing slowly.  If you are on bed rest, try to change positions often. What else do I need to know about taking care of my skin?   To relieve dry skin and itching: ? Limit baths and showers to 5-10 minutes. ? Bathe with lukewarm water instead of hot water. ? Use mild soap and gentle skin cleansers. Do not use soap that is perfumed or harsh or dries your skin. ? Put on lotion as soon as you finish bathing.  Make sure that your health care provider performs a visual foot exam at every medical visit.  Schedule a foot exam with your health care provider once every year. This exam includes an inspection of the structure and skin of your feet.  If you get a skin injury, such as a cut, blister, or sore, check the area every day for signs of infection. Check for: ? More redness, swelling, or pain. ? More fluid or blood. ? Warmth. ? Pus or a bad smell. Contact a health care provider if:  You develop a cut or sore, especially on your feet.  You develop signs of infection after a skin injury.  Your blood glucose level is higher than 240 mg/dL (13.3 mmol/L) for 2 days in a row.  You have itchy skin that develops redness or a rash.  You have discolored areas of skin.  You have areas where your skin is changing, such as thickening or appearing shiny. Summary  Diabetes (diabetes mellitus) can lead to health problems over time, including skin problems.  People with diabetes have a higher  risk for many types of skin complications.  Check your skin every day for cuts, bruises, redness, blisters, or sores, especially on your feet.  Tell your health care provider about any cuts, wounds, or sores you have, especially if they are healing slowly. This information is not intended to replace advice given to you by your health care provider. Make sure you discuss any questions you have with your health care provider. Document Revised: 08/04/2016 Document Reviewed: 06/23/2015 Elsevier Patient Education  [place].

## 2019-11-08 NOTE — Progress Notes (Signed)
Established Patient Office Visit  Subjective:  Patient ID: Blake West, male    DOB: 04-02-1950  Age: 69 y.o. MRN: [id number]  CC:  Chief Complaint  Patient presents with  . Follow-up    hypertension    HPI Blake West presents for a 3 month f/up.   DMT2: Reports improving. FBS 100 and down to 80 during the day.He declines medication and working on diet/exercise. [place]- very active outdoors.   Lab Results  Component Value Date   HGBA1C 6.6 (H) 08/08/2019   HTN/CAD/CHF: Maintained furosemide 80 mg daily, Toprol-XL 25 mg daily, spironolactone 2025 mg 1/2 tablet daily followed by cardiology.  Appointment coming up 11/13/2019 .  BP at home 130/72 , HR 80-70s. No CP and no DOE. Knees bother him-both- had knee injections in the past- gel and it did not help much. No further work up. He takes Advil for pain- 800 mg every 2 days. Tylenol doesn't work. He used to take [employer] and GI told him to stop it. He did not take the statin- wife put him on an herbal ch  HLD/BMI 34/Obesity: Uncontrolled. He declines statin or other lipid lowering medication. He takes a natural food/herbal supplement.    Lab Results  Component Value Date   CHOL 216 (H) 11/08/2019   HDL 31.40 (L) 11/08/2019   LDLCALC 162 (H) 08/08/2019   LDLDIRECT 150.0 11/08/2019   TRIG 209.0 (H) 11/08/2019   CHOLHDL 7 11/08/2019   Hypothyroid: Uncontrolled. Not at goal. Declines medication. He is  taking natural product and his TSH remains elevated.   Lab Results  Component Value Date   TSH 10.14 (H) 11/08/2019    Cough: Onset months ago after working in an old house and drilled into the baseboard and wall and got into cellulose installation and white powder came out and he did not wear a mask and maybe mold exposure- he started coughing clumps -clear and this lasted 1 week and then cleared up - at  the tail end of it now. Still has occasional cough- clear sputum, no orthopnea. Checks his  pulse ox 94-96 %  after activity. He doesn't know his resting oxygen sat, but will check it. Denies SOB/DOE.   Immunizations: Continues to decline all vaccines including Covid.   Past Medical History:  Diagnosis Date  . Abnormal EKG   . Biventricular failure ([place])   . CAD (coronary artery disease)   . Colorectal polyps   . Gout   . Hyperlipidemia LDL goal <70   . Hypertension   . Hypothyroidism   . Kidney stone   . Pneumonia   . Testicular hypofunction   . Type 2 diabetes mellitus ([place]) 04/26/2019    Past Surgical History:  Procedure Laterality Date  . CARDIAC CATHETERIZATION    . FOOT SURGERY    . RIGHT/LEFT HEART CATH AND CORONARY ANGIOGRAPHY N/A 04/30/2019   Procedure: RIGHT/LEFT HEART CATH AND CORONARY ANGIOGRAPHY;  Surgeon: Troy Sine, MD;  Location: [facility];  Service: Cardiovascular;  Laterality: N/A;    Family History  Problem Relation Age of Onset  . Heart attack Mother 41  . Pulmonary fibrosis Father   . Congestive Heart Failure Brother   . Heart attack Brother   . Heart attack Brother   . Early death Sister   . Kidney disease Paternal Grandfather   . Diabetes Sister     Social History   Socioeconomic History  . Marital status: Married  Spouse name: Not on file  . Number of children: Not on file  . Years of education: Not on file  . Highest education level: Not on file  Occupational History  . Occupation: Chief Financial Officer  Tobacco Use  . Smoking status: Former Smoker    Packs/day: 1.00    Years: 30.00    Pack years: 30.00    Types: Cigarettes    Quit date: 01/24/2002    Years since quitting: 17.8  . Smokeless tobacco: Never Used  Vaping Use  . Vaping Use: Never used  Substance and Sexual Activity  . Alcohol use: No  . Drug use: No  . Sexual activity: Not on file  Other Topics Concern  . Not on file  Social History Narrative  . Not on file   Social Determinants of Health   Financial Resource Strain:   . Difficulty of Paying Living  Expenses: Not on file  Food Insecurity:   . Worried About Charity fundraiser in the Last Year: Not on file  . Ran Out of Food in the Last Year: Not on file  Transportation Needs:   . Lack of Transportation (Medical): Not on file  . Lack of Transportation (Non-Medical): Not on file  Physical Activity:   . Days of Exercise per Week: Not on file  . Minutes of Exercise per Session: Not on file  Stress:   . Feeling of Stress : Not on file  Social Connections:   . Frequency of Communication with Friends and Family: Not on file  . Frequency of Social Gatherings with Friends and Family: Not on file  . Attends Religious Services: Not on file  . Active Member of Clubs or Organizations: Not on file  . Attends Archivist Meetings: Not on file  . Marital Status: Not on file  Intimate Partner Violence:   . Fear of Current or Ex-Partner: Not on file  . Emotionally Abused: Not on file  . Physically Abused: Not on file  . Sexually Abused: Not on file    Outpatient Medications Prior to Visit  Medication Sig Dispense Refill  . Ascorbic Acid (VITAMIN C) 100 MG tablet Take 500 mg by mouth daily.    . cholecalciferol (VITAMIN D) 400 UNITS TABS tablet Take 400 Units by mouth 3 (three) times daily.     . Multiple Vitamin (THERA) TABS Take 1 tablet by mouth daily.    . NON FORMULARY Take 1 capsule by mouth with breakfast, with lunch, and with evening meal. Alphalplex    . OVER THE COUNTER MEDICATION Thyroid otc medicine Simplex m    . Vitamin E 400 units TABS Take 400 Units by mouth 2 (two) times daily.     . furosemide (LASIX) 80 MG tablet Take 1 tablet (80 mg total) by mouth daily. 90 tablet 0  . metoprolol succinate (TOPROL-XL) 25 MG 24 hr tablet Take 1 tablet (25 mg total) by mouth daily. 90 tablet 0  . spironolactone (ALDACTONE) 25 MG tablet Take 0.5 tablets (12.5 mg total) by mouth daily. 15 tablet 5   No facility-administered medications prior to visit.    Allergies  Allergen  Reactions  . Codeine     Joints hurt     Review of Systems   Pertinent positives noted in history of present illness and otherwise negative. Objective:    Physical Exam Vitals reviewed.  Constitutional:      Appearance: He is obese.  Cardiovascular:     Rate and Rhythm: Normal  rate and regular rhythm.     Pulses: Normal pulses.     Heart sounds: Normal heart sounds.     Comments: Left foot with PT pulse, cannot palpate DP pulse. Rt foot with strong PT and DP pulses.  Pulmonary:     Effort: Pulmonary effort is normal.     Breath sounds: Rales present.     Comments: Bibasilar rales.  Abdominal:     Palpations: Abdomen is soft.     Tenderness: There is no abdominal tenderness.  Musculoskeletal:        General: Normal range of motion.     Cervical back: Normal range of motion and neck supple.  Skin:    General: Skin is warm and dry.     Comments: Dry blister left medial ankle where his shoe rubs. No other foot lesions.   Neurological:     General: No focal deficit present.     Mental Status: He is alert and oriented to person, place, and time.  Psychiatric:        Mood and Affect: Mood normal.        Behavior: Behavior normal.        Thought Content: Thought content normal.        Judgment: Judgment normal.    BP 130/72 (BP Location: Left Arm, Patient Position: Sitting, Cuff Size: Normal)   Pulse 91   Temp 98.5 F (36.9 C) (Oral)   Ht 5\' 11"  (1.803 m)   Wt 246 lb (111.6 kg)   SpO2 94%   BMI 34.31 kg/m  Wt Readings from Last 3 Encounters:  11/08/19 246 lb (111.6 kg)  08/08/19 236 lb (107 kg)  06/26/19 233 lb (105.7 kg)   Health Maintenance Due  Topic Date Due  . FOOT EXAM  Never done  . OPHTHALMOLOGY EXAM  Never done  . COLONOSCOPY  Never done    There are no preventive care reminders to display for this patient.  Lab Results  Component Value Date   TSH 11.47 (H) 08/08/2019   Lab Results  Component Value Date   WBC 9.8 08/08/2019   HGB 15.6 08/08/2019    HCT 46.0 08/08/2019   MCV 88.4 08/08/2019   PLT 292.0 08/08/2019   Lab Results  Component Value Date   NA 138 08/08/2019   K 4.5 08/08/2019   CO2 26 08/08/2019   GLUCOSE 91 08/08/2019   BUN 19 08/08/2019   CREATININE 0.96 08/08/2019   BILITOT 1.1 08/08/2019   ALKPHOS 43 08/08/2019   AST 22 08/08/2019   ALT 24 08/08/2019   PROT 7.5 08/08/2019   ALBUMIN 4.3 08/08/2019   CALCIUM 9.4 08/08/2019   ANIONGAP 10 07/05/2019   GFR 77.57 08/08/2019   Lab Results  Component Value Date   CHOL 223 (H) 08/08/2019   Lab Results  Component Value Date   HDL 32.60 (L) 08/08/2019   Lab Results  Component Value Date   LDLCALC 162 (H) 08/08/2019   Lab Results  Component Value Date   TRIG 142.0 08/08/2019   Lab Results  Component Value Date   CHOLHDL 7 08/08/2019   Lab Results  Component Value Date   HGBA1C 6.6 (H) 08/08/2019      Assessment & Plan:   Problem List Items Addressed This Visit      Endocrine   Type 2 diabetes mellitus ([place])   Relevant Orders   Comprehensive metabolic panel   Hypothyroidism   Relevant Orders   TSH  Other   Hyperlipidemia   Relevant Orders   Lipid panel   Cough productive of clear sputum - Primary   Relevant Orders   DG Chest 2 View   CBC with Differential/Platelet      No orders of the defined types were placed in this encounter. Please bring in the names of the supplements that you are taking.  Please go to the  x-ray today for cough, and crackles heard in both lung bases.   Please record your oxygen saturations with your pulse oximeter sitting at rest and then walk around and do normal exercise and then check it after exercise.  Bring in those readings to your cardiology appointment.  Continue with healthy diet, low-salt diet, exercise.  For the sore on the left inside ankle from rubbing of shoe, please change shoes,. Monitor for healing of the sore.  He has diabetes and do not want that to turn that into a foot ulcer.   That left foot also has decreased pulse on the foot.  Consider vascular consult in the future.  We will check the cholesterol panel today.  Please follow-up office visit in 3 months  Follow-up: No follow-ups on file.  This visit occurred during the SARS-CoV-2 public health emergency.  Safety protocols were in place, including screening questions prior to the visit, additional usage of staff PPE, and extensive cleaning of exam room while observing appropriate contact time as indicated for disinfecting solutions.    Denice Paradise, NP

## 2019-11-13 ENCOUNTER — Other Ambulatory Visit: Payer: Self-pay

## 2019-11-13 ENCOUNTER — Encounter: Payer: Self-pay | Admitting: Internal Medicine

## 2019-11-13 ENCOUNTER — Ambulatory Visit: Payer: PPO | Admitting: Internal Medicine

## 2019-11-13 VITALS — BP 150/90 | HR 80 | Ht 71.0 in | Wt 248.0 lb

## 2019-11-13 DIAGNOSIS — I5022 Chronic systolic (congestive) heart failure: Secondary | ICD-10-CM

## 2019-11-13 DIAGNOSIS — I2510 Atherosclerotic heart disease of native coronary artery without angina pectoris: Secondary | ICD-10-CM

## 2019-11-13 DIAGNOSIS — I10 Essential (primary) hypertension: Secondary | ICD-10-CM

## 2019-11-13 DIAGNOSIS — I428 Other cardiomyopathies: Secondary | ICD-10-CM

## 2019-11-13 DIAGNOSIS — E039 Hypothyroidism, unspecified: Secondary | ICD-10-CM

## 2019-11-13 MED ORDER — FUROSEMIDE 40 MG PO TABS
40.0000 mg | ORAL_TABLET | Freq: Every day | ORAL | 0 refills | Status: DC
Start: 2019-11-13 — End: 2021-04-19

## 2019-11-13 NOTE — Progress Notes (Signed)
Follow-up Outpatient Visit Date: 11/13/2019  Primary Care Provider: Marval Regal, NP [address] [place] [address]  Chief Complaint: Follow-up heart failure  HPI:  Blake West is a 69 y.o. male with history of moderate coronary artery disease, biventricular failure felt to be due to nonischemic cardiomyopathy, pulmonary hypertension, PVCs/NSVT, PACs, diabetes mellitus, obesity, obstructive sleep apnea not on CPAP, hyperlipidemia, prior tobacco use, and reported hemochromatosis, who presents for follow-up of cardiomyopathy.  I met him in early June, at which time he was feeling better though he still noted occasional exertional dyspnea and mild swelling of the left leg.  We agreed to add low-dose spironolactone, as soft blood pressure precluded escalation of metoprolol succinate or addition of an ACE inhibitor/ARB.  1 month follow-up was recommended, though Blake West has not been seen back until today.  Today, Blake West reports that he has been feeling relatively well.  He was plagued by chest and sinus congestion a week or 2 ago.  He reports a negative COVID-19 test.  He was told by his PCP that a chest radiograph suggested pulmonary edema.  He now feels back to his baseline.  Blake West and his wife wonder if exposure to insulation may have caused his recent congestion and pulmonary edema.  Of note, Blake West never started spironolactone, as he and his wife were concerned about potential side effects.  He has also self titrated his furosemide down to 20 mg daily, as he felt as though he was becoming dehydrated on higher doses.  He remains on metoprolol succinate 25 mg daily.  Recent lab work showed persistently elevated TSH, though Blake West declines thyroid replacement.  Blake West denies chest pain, palpitations, and edema.  He reports occasional orthostatic lightheadedness when he stands up after having been bent over for an extended period of time.  He endorses  feeling bloated, but his wife interjects that she thinks it is related to Blake West not chewing his food well.  Some days good, some days bad.  Had some sinus congestion.  COVID test was negative.  Thought maybe some fluid in lungs.  Has put on some weight.  No swelling.  Some bloating and abdominal swelling.  No orthopnea.  No chest pain, palpitations.  Orthostatic LH when bending over for a long time.  Never stopped spironolactone because of concern for gynecomastia.  Using 1/4 of Lasix (20 mg daily).  Made him nauseated.  Salts some food.  --------------------------------------------------------------------------------------------------  Past Medical History:  Diagnosis Date  . Abnormal EKG   . Biventricular failure ([place])   . CAD (coronary artery disease)   . Colorectal polyps   . Gout   . Hyperlipidemia LDL goal <70   . Hypertension   . Hypothyroidism   . Kidney stone   . Pneumonia   . Testicular hypofunction   . Type 2 diabetes mellitus ([place]) 04/26/2019   Past Surgical History:  Procedure Laterality Date  . CARDIAC CATHETERIZATION    . FOOT SURGERY    . RIGHT/LEFT HEART CATH AND CORONARY ANGIOGRAPHY N/A 04/30/2019   Procedure: RIGHT/LEFT HEART CATH AND CORONARY ANGIOGRAPHY;  Surgeon: Troy Sine, MD;  Location: [facility];  Service: Cardiovascular;  Laterality: N/A;    Current Meds  Medication Sig  . Ascorbic Acid (VITAMIN C) 100 MG tablet Take 500 mg by mouth daily.  . cholecalciferol (VITAMIN D) 400 UNITS TABS tablet Take 400 Units by mouth 3 (three) times daily.   . metoprolol  succinate (TOPROL-XL) 25 MG 24 hr tablet Take 1 tablet (25 mg total) by mouth daily.  . Multiple Vitamin (THERA) TABS Take 1 tablet by mouth daily.  . NON FORMULARY Take 1 capsule by mouth with breakfast, with lunch, and with evening meal. Alphalplex  . OVER THE COUNTER MEDICATION Thyroid otc medicine Simplex m  . Vitamin E 400 units TABS Take 400 Units by mouth 2 (two) times daily.   .  [DISCONTINUED] furosemide (LASIX) 80 MG tablet Take 1 tablet (80 mg total) by mouth daily.    Allergies: Codeine  Social History   Tobacco Use  . Smoking status: Former Smoker    Packs/day: 1.00    Years: 30.00    Pack years: 30.00    Types: Cigarettes    Quit date: 01/24/2002    Years since quitting: 17.8  . Smokeless tobacco: Never Used  Vaping Use  . Vaping Use: Never used  Substance Use Topics  . Alcohol use: No  . Drug use: No    Family History  Problem Relation Age of Onset  . Heart attack Mother 19  . Pulmonary fibrosis Father   . Congestive Heart Failure Brother   . Heart attack Brother   . Heart attack Brother   . Early death Sister   . Kidney disease Paternal Grandfather   . Diabetes Sister     Review of Systems: A 12-system review of systems was performed and was negative except as noted in the HPI.  --------------------------------------------------------------------------------------------------  Physical Exam: BP (!) 150/90 (BP Location: Left Arm, Patient Position: Sitting, Cuff Size: Normal)   Pulse 80   Ht 5' 11"  (1.803 m)   Wt 248 lb (112.5 kg)   SpO2 91%   BMI 34.59 kg/m   General: NAD.  Accompanied by his wife. HEENT: No conjunctival pallor or scleral icterus. Facemask in place. Neck: Supple without lymphadenopathy, thyromegaly, JVD, or HJR, though evaluation is limited by body habitus. Lungs: Normal work of breathing. Clear to auscultation bilaterally without wheezes or crackles. Heart: Regular rate and rhythm without murmurs, rubs, or gallops. Abd: Bowel sounds present.  Mildly distended but nontender. Ext: 1+ pitting edema to the proximal calves bilaterally.  EKG: Normal sinus rhythm with first-degree AV block and poor R wave progression.  Compared with prior tracing from 05/09/2019, PVC and anterolateral T wave inversions are no longer present.  Lab Results  Component Value Date   WBC 12.4 (H) 11/08/2019   HGB 16.0 11/08/2019   HCT  47.7 11/08/2019   MCV 90.1 11/08/2019   PLT 258.0 11/08/2019    Lab Results  Component Value Date   NA 137 11/08/2019   K 4.1 11/08/2019   CL 103 11/08/2019   CO2 26 11/08/2019   BUN 15 11/08/2019   CREATININE 0.86 11/08/2019   GLUCOSE 100 (H) 11/08/2019   ALT 29 11/08/2019    Lab Results  Component Value Date   CHOL 216 (H) 11/08/2019   HDL 31.40 (L) 11/08/2019   LDLCALC 162 (H) 08/08/2019   LDLDIRECT 150.0 11/08/2019   TRIG 209.0 (H) 11/08/2019   CHOLHDL 7 11/08/2019    --------------------------------------------------------------------------------------------------  ASSESSMENT AND PLAN: Chronic HFrEF due to nonischemic cardiomyopathy: Blake West demonstrates mild to moderate volume overloaded with evidence of leg edema and some abdominal distention on exam.  He was also recently told that chest radiograph showed pulmonary edema.  His weight is up 15 pounds since our last visit in June.  I believe his symptoms and progressive weight gain  are likely a reflection of suboptimal heart failure therapy, driven primarily by self titration of medication and reluctance to take goal-directed medical therapy.  We spoke at length regarding the rationale behind evidence-based heart failure therapies and there effect on mortality, hospitalization, and quality of life.  Blake West agreed to increase furosemide to 40 mg daily but otherwise does not wish to make any changes today.  At some point, it would be useful to reassess his LVEF by echo, though given that he is on minimal evidence-based heart failure therapy I am not optimistic that his LVEF will have improved considerably.  Given questionable medication compliance, I am unsure of the utility of ICD placement for primary prevention.  Nonobstructive coronary artery disease: No angina reported.  New current dose of metoprolol.  Addition of statin therapy should be addressed at follow-up visit as LDL is suboptimally  controlled.  Hypertension: Blood pressure suboptimally controlled today.  I encouraged Blake West to minimize his sodium intake to help with his blood pressure and his heart failure.  I encouraged him to consider addition of evidence-based heart failure therapy that would also help improve his blood pressure, but he declined.  Hypothyroidism: Recent labs are notable for TSH of 10 consistent with hypothyroidism.  I spoke with Blake West about considering thyroid supplementation with levothyroxine, but he and his wife are concerned about possible side effects.  I will defer ongoing management to Ms. Jerelene Redden.  Follow-up: Return to clinic in 1 month.  Nelva Bush, MD 11/13/2019 1:09 PM

## 2019-11-13 NOTE — Patient Instructions (Signed)
Medication Instructions:  Your physician has recommended you make the following change in your medication:  TAKE Furosemide 40 mg by mouth once a day.  *If you need a refill on your cardiac medications before your next appointment, please call your pharmacy*  Follow-Up: At [facility], you and your health needs are our priority.  As part of our continuing mission to provide you with exceptional heart care, we have created designated Provider Care Teams.  These Care Teams include your primary Cardiologist (physician) and Advanced Practice Providers (APPs -  Physician Assistants and Nurse Practitioners) who all work together to provide you with the care you need, when you need it.  We recommend signing up for the patient portal called "MyChart".  Sign up information is provided on this After Visit Summary.  MyChart is used to connect with patients for Virtual Visits (Telemedicine).  Patients are able to view lab/test results, encounter notes, upcoming appointments, etc.  Non-urgent messages can be sent to your provider as well.   To learn more about what you can do with MyChart, go to [email or web address].    Your next appointment:   1 month(s)  The format for your next appointment:   In Person  Provider:   You may see Nelva Bush, MD or one of the following Advanced Practice Providers on your designated Care Team:    Murray Hodgkins, NP  Christell Faith, PA-C  Marrianne Mood, PA-C  Cadence Almont, [place]

## 2019-11-21 ENCOUNTER — Other Ambulatory Visit: Payer: Self-pay

## 2019-11-21 ENCOUNTER — Other Ambulatory Visit (INDEPENDENT_AMBULATORY_CARE_PROVIDER_SITE_OTHER): Payer: PPO

## 2019-11-21 DIAGNOSIS — D72829 Elevated white blood cell count, unspecified: Secondary | ICD-10-CM | POA: Diagnosis not present

## 2019-11-21 LAB — CBC WITH DIFFERENTIAL/PLATELET
Basophils Absolute: 0.1 10*3/uL (ref 0.0–0.1)
Basophils Relative: 0.8 % (ref 0.0–3.0)
Eosinophils Absolute: 0.6 10*3/uL (ref 0.0–0.7)
Eosinophils Relative: 4.4 % (ref 0.0–5.0)
HCT: 47.4 % (ref 39.0–52.0)
Hemoglobin: 15.9 g/dL (ref 13.0–17.0)
Lymphocytes Relative: 24.5 % (ref 12.0–46.0)
Lymphs Abs: 3.2 10*3/uL (ref 0.7–4.0)
MCHC: 33.6 g/dL (ref 30.0–36.0)
MCV: 89.6 fl (ref 78.0–100.0)
Monocytes Absolute: 1.1 10*3/uL — ABNORMAL HIGH (ref 0.1–1.0)
Monocytes Relative: 8.1 % (ref 3.0–12.0)
Neutro Abs: 8.1 10*3/uL — ABNORMAL HIGH (ref 1.4–7.7)
Neutrophils Relative %: 62.2 % (ref 43.0–77.0)
Platelets: 286 10*3/uL (ref 150.0–400.0)
RBC: 5.29 Mil/uL (ref 4.22–5.81)
RDW: 14 % (ref 11.5–15.5)
WBC: 13.1 10*3/uL — ABNORMAL HIGH (ref 4.0–10.5)

## 2019-11-22 MED ORDER — DOXYCYCLINE HYCLATE 100 MG PO TABS: 100.0000 mg | ORAL_TABLET | Freq: Two times a day (BID) | ORAL | 0 refills | Status: DC

## 2019-11-22 MED ORDER — SACCHAROMYCES BOULARDII 250 MG PO CAPS
250.0000 mg | ORAL_CAPSULE | Freq: Two times a day (BID) | ORAL | 0 refills | Status: AC
Start: 2019-11-22 — End: 2019-12-06

## 2019-11-22 MED ORDER — AZELASTINE HCL 0.1 % NA SOLN
1.0000 | Freq: Every evening | NASAL | 0 refills | Status: DC | PRN
Start: 2019-11-22 — End: 2021-04-19

## 2019-11-22 NOTE — Addendum Note (Signed)
Addended by: Denice Paradise A on: 11/22/2019 05:42 PM   Modules accepted: Orders

## 2019-12-12 ENCOUNTER — Emergency Department (HOSPITAL_COMMUNITY): Payer: PPO

## 2019-12-12 ENCOUNTER — Ambulatory Visit (HOSPITAL_COMMUNITY)
Admission: EM | Admit: 2019-12-12 | Discharge: 2019-12-12 | Disposition: A | Discharge status: Home / Self Care | Payer: PPO

## 2019-12-12 ENCOUNTER — Encounter (HOSPITAL_COMMUNITY): Payer: Self-pay | Admitting: Emergency Medicine

## 2019-12-12 ENCOUNTER — Ambulatory Visit: Payer: PPO | Admitting: Physician Assistant

## 2019-12-12 ENCOUNTER — Other Ambulatory Visit: Payer: Self-pay

## 2019-12-12 ENCOUNTER — Emergency Department (HOSPITAL_COMMUNITY)
Admission: EM | Admit: 2019-12-12 | Discharge: 2019-12-12 | Disposition: A | Discharge status: Home / Self Care | Payer: PPO | Attending: Emergency Medicine | Admitting: Emergency Medicine

## 2019-12-12 DIAGNOSIS — S61421A Laceration with foreign body of right hand, initial encounter: Secondary | ICD-10-CM | POA: Diagnosis not present

## 2019-12-12 DIAGNOSIS — I2510 Atherosclerotic heart disease of native coronary artery without angina pectoris: Secondary | ICD-10-CM | POA: Diagnosis not present

## 2019-12-12 DIAGNOSIS — W268XXA Contact with other sharp object(s), not elsewhere classified, initial encounter: Secondary | ICD-10-CM | POA: Diagnosis not present

## 2019-12-12 DIAGNOSIS — Z79899 Other long term (current) drug therapy: Secondary | ICD-10-CM | POA: Diagnosis not present

## 2019-12-12 DIAGNOSIS — S61411A Laceration without foreign body of right hand, initial encounter: Secondary | ICD-10-CM | POA: Insufficient documentation

## 2019-12-12 DIAGNOSIS — Z87891 Personal history of nicotine dependence: Secondary | ICD-10-CM | POA: Insufficient documentation

## 2019-12-12 DIAGNOSIS — E039 Hypothyroidism, unspecified: Secondary | ICD-10-CM | POA: Insufficient documentation

## 2019-12-12 DIAGNOSIS — I10 Essential (primary) hypertension: Secondary | ICD-10-CM | POA: Diagnosis not present

## 2019-12-12 DIAGNOSIS — Z8739 Personal history of other diseases of the musculoskeletal system and connective tissue: Secondary | ICD-10-CM | POA: Diagnosis not present

## 2019-12-12 DIAGNOSIS — E119 Type 2 diabetes mellitus without complications: Secondary | ICD-10-CM | POA: Insufficient documentation

## 2019-12-12 MED ORDER — IBUPROFEN 400 MG PO TABS
600.0000 mg | ORAL_TABLET | Freq: Once | ORAL | Status: AC
  Administered 2019-12-12: 600 mg via ORAL
  Filled 2019-12-12: qty 1

## 2019-12-12 MED ORDER — LIDOCAINE HCL (PF) 1 % IJ SOLN
10.0000 mL | Freq: Once | INTRAMUSCULAR | Status: DC
  Filled 2019-12-12: qty 10

## 2019-12-12 NOTE — ED Triage Notes (Signed)
Pt sent from UC with laceration to right hand. Hand wrapped and no bleeding at this time. States he cut his hand with a grinder.

## 2019-12-12 NOTE — ED Notes (Signed)
Injured right hand with a grinder.  Injury occurred just prior toarrival to ucc.  Patient has a straight laceration between index and middle finger on right hand.  Patient is able to move fingers, laceration very deep.  Cloth brought from home with blood on it.  But to look at wound, bleeding is controlled considering depth of wound.  Patient repeatedly comments on having good clotting factors.  Molli Barrows, np reviewed wound at patient access and suggested going to ED for concerns of tendon involvement

## 2019-12-12 NOTE — ED Notes (Signed)
Patient is being discharged from the Urgent Care and sent to the Emergency Department via private vehicle . Per Molli Barrows, np, patient is in need of higher level of care due to complex laceration, possible tendon injury. Patient is aware and verbalizes understanding of plan of care. There were no vitals filed for this visit.

## 2019-12-12 NOTE — Discharge Instructions (Signed)
Please follow up with Dr. Mingo Amber hand surgeon given your laceration with small amount of exposed tendon Keep wound clean and dry. Return to the ED in 10 days for suture removal.  Return to the ED IMMEDIATELY for signs of infection including redness/swelling around the wound, drainage of pus, fevers > 100.4, chills, or any other associated symptoms

## 2019-12-12 NOTE — ED Provider Notes (Signed)
[facility] EMERGENCY DEPARTMENT Provider Note   CSN: [id number] Arrival date & time: 12/12/19  1223     History Chief Complaint  Patient presents with  . Laceration    Blake West is a 69 y.o. male who presents to the ED today with complaint of laceration to his right hand that occurred 3 hours ago. Pt was working with a grinder at his job when he accidentally sliced his hand. He went to urgent care however it appears there was concern for tendon involvement and pt was sent to the ED. He reports his tetanus is UTD. He has no other complaints at this time. Pt is not anticoagulated. No weakness/numbness/tingling.   The history is provided by the patient and medical records.       Past Medical History:  Diagnosis Date  . Abnormal EKG   . Biventricular failure ([place])   . CAD (coronary artery disease)   . Colorectal polyps   . Gout   . Hyperlipidemia LDL goal <70   . Hypertension   . Hypothyroidism   . Kidney stone   . Pneumonia   . Testicular hypofunction   . Type 2 diabetes mellitus ([place]) 04/26/2019    Patient Active Problem List   Diagnosis Date Noted  . Cough productive of clear sputum 11/08/2019  . Chronic HFrEF (heart failure with reduced ejection fraction) ([place]) 08/08/2019  . Vitamin D deficiency 08/08/2019  . Nonischemic cardiomyopathy ([place]) 06/27/2019  . Coronary artery disease involving native coronary artery of native heart without angina pectoris 06/27/2019  . Polycythemia 06/27/2019  . Hypothyroidism 05/11/2019  . Gout of multiple sites 05/11/2019  . Type 2 diabetes mellitus ([place]) 04/26/2019  . Hyperlipidemia 04/26/2019  . Acute congestive heart failure ([place]) 04/25/2019  . Essential hypertension 04/25/2019  . OSA on CPAP 04/25/2019  . Class 2 severe obesity due to excess calories with serious comorbidity and body mass index (BMI) of 36.0 to 36.9 in adult [facility]) 04/25/2019    Past Surgical History:  Procedure Laterality Date  . CARDIAC  CATHETERIZATION    . FOOT SURGERY    . RIGHT/LEFT HEART CATH AND CORONARY ANGIOGRAPHY N/A 04/30/2019   Procedure: RIGHT/LEFT HEART CATH AND CORONARY ANGIOGRAPHY;  Surgeon: Troy Sine, MD;  Location: [facility];  Service: Cardiovascular;  Laterality: N/A;       Family History  Problem Relation Age of Onset  . Heart attack Mother 27  . Pulmonary fibrosis Father   . Congestive Heart Failure Brother   . Heart attack Brother   . Heart attack Brother   . Early death Sister   . Kidney disease Paternal Grandfather   . Diabetes Sister     Social History   Tobacco Use  . Smoking status: Former Smoker    Packs/day: 1.00    Years: 30.00    Pack years: 30.00    Types: Cigarettes    Quit date: 01/24/2002    Years since quitting: 17.8  . Smokeless tobacco: Never Used  Vaping Use  . Vaping Use: Never used  Substance Use Topics  . Alcohol use: No  . Drug use: No    Home Medications Prior to Admission medications   Medication Sig Start Date End Date Taking? Authorizing Provider  Ascorbic Acid (VITAMIN C) 100 MG tablet Take 500 mg by mouth daily.    [provider]  azelastine (ASTELIN) 0.1 % nasal spray Place 1 spray into both nostrils at bedtime as needed for rhinitis. Use  in each nostril as directed 11/22/19 12/22/19  Marval Regal, NP  cholecalciferol (VITAMIN D) 400 UNITS TABS tablet Take 400 Units by mouth 3 (three) times daily.     [provider]  doxycycline (VIBRA-TABS) 100 MG tablet Take 1 tablet (100 mg total) by mouth 2 (two) times daily. 11/22/19   Marval Regal, NP  furosemide (LASIX) 40 MG tablet Take 1 tablet (40 mg total) by mouth daily. 11/13/19 02/11/20  End, Harrell Gave, MD  metoprolol succinate (TOPROL-XL) 25 MG 24 hr tablet Take 1 tablet (25 mg total) by mouth daily. 08/08/19 11/13/19  Marval Regal, NP  Multiple Vitamin (THERA) TABS Take 1 tablet by mouth daily.    [provider]  NON FORMULARY Take 1 capsule by mouth  with breakfast, with lunch, and with evening meal. Alphalplex    [provider]  OVER THE COUNTER MEDICATION Thyroid otc medicine Simplex m    [provider]  Vitamin E 400 units TABS Take 400 Units by mouth 2 (two) times daily.     [provider]    Allergies    Codeine  Review of Systems   Review of Systems  Constitutional: Negative for chills and fever.  Musculoskeletal: Positive for arthralgias.  Skin: Positive for wound.  Neurological: Negative for weakness and numbness.    Physical Exam Updated Vital Signs BP (!) 159/81   Pulse 76   Temp 98.4 F (36.9 C) (Oral)   Resp 16   Ht 5\' 11"  (1.803 m)   Wt 105.2 kg   SpO2 100%   BMI 32.36 kg/m   Physical Exam Vitals and nursing note reviewed.  Constitutional:      Appearance: He is not ill-appearing.  HENT:     Head: Normocephalic and atraumatic.  Eyes:     Conjunctiva/sclera: Conjunctivae normal.  Cardiovascular:     Rate and Rhythm: Normal rate and regular rhythm.     Pulses: Normal pulses.  Pulmonary:     Effort: Pulmonary effort is normal.     Breath sounds: Normal breath sounds. No wheezing, rhonchi or rales.  Musculoskeletal:     Comments: Large bandage over right hand; removed with 3 cm laceration between 2nd and 3rd digit on R hand; bleeding controlled. Mild TTP to this area; no obvious exposed tendon at this time. ROM intact to MCP, PIP, and DIP joints of 2nd and 3rd finger where laceration is located. Pt also able to abduct and adduct fingers without issue. Cap refill < 2 seconds. 2+ radial pulse.   Skin:    General: Skin is warm and dry.     Coloration: Skin is not jaundiced.  Neurological:     Mental Status: He is alert.     ED Results / Procedures / Treatments   Labs (all labs ordered are listed, but only abnormal results are displayed) Labs Reviewed - No data to display  EKG None  Radiology DG Hand Complete Right  Result Date: 12/12/2019 CLINICAL DATA:   Laceration between the first and second digits. History of rheumatoid arthritis. EXAM: RIGHT HAND - COMPLETE 3+ VIEW COMPARISON:  None. FINDINGS: No fracture or dislocation.  No bone lesion. There are advanced arthropathic changes of the radiocarpal joint consistent with rheumatoid arthritis. Joint space narrowing and subchondral sclerosis at the scaphoid trapezium trapezoid articulation is more likely osteoarthritis. There is significant osteoarthritis involving the D IP joint of the middle finger. There are tiny radiopaque foreign bodies projecting between the bases of the second  and third fingers, along the soft tissues of the ulnar aspect of the proximal index finger and radial aspect of the distal middle finger. These may reside on the skin surface. IMPRESSION: 1. No fracture or dislocation. 2. Small radiopaque foreign bodies as detailed above which may reside on the skin surface. Electronically Signed   By: Lajean Manes M.D.   On: 12/12/2019 17:19    Procedures .Marland KitchenLaceration Repair  Date/Time: 12/12/2019 5:51 PM Performed by: Eustaquio Maize, PA-C Authorized by: Eustaquio Maize, PA-C   Consent:    Consent obtained:  Verbal   Consent given by:  Patient   Risks discussed:  Infection, pain, retained foreign body, need for additional repair and poor cosmetic result Anesthesia (see MAR for exact dosages):    Anesthesia method:  Local infiltration   Local anesthetic:  Lidocaine 1% w/o epi Laceration details:    Location:  Hand   Hand location:  R hand, dorsum   Length (cm):  3   Depth (mm):  4 Repair type:    Repair type:  Intermediate Pre-procedure details:    Preparation:  Patient was prepped and draped in usual sterile fashion Exploration:    Hemostasis achieved with:  Direct pressure   Wound exploration: wound explored through full range of motion and entire depth of wound probed and visualized     Wound extent: no tendon damage noted   Treatment:    Area cleansed with:  Betadine    Irrigation solution:  Sterile saline   Irrigation volume:  1 L Skin repair:    Repair method:  Sutures   Suture size:  4-0   Suture material:  Prolene   Suture technique:  Simple interrupted   Number of sutures:  6 Approximation:    Approximation:  Close Post-procedure details:    Dressing:  Non-adherent dressing   Patient tolerance of procedure:  Tolerated well, no immediate complications   (including critical care time)  Medications Ordered in ED Medications  lidocaine (PF) (XYLOCAINE) 1 % injection 10 mL (10 mLs Infiltration Handoff 12/12/19 1651)  ibuprofen (ADVIL) tablet 600 mg (600 mg Oral Given 12/12/19 1619)    ED Course  I have reviewed the triage vital signs and the nursing notes.  Pertinent labs & imaging results that were available during my care of the patient were reviewed by me and considered in my medical decision making (see chart for details).    MDM Rules/Calculators/A&P                          69 year old male who presents to the ED today after sustaining a laceration to his right hand from a grinder at work 3 hours ago.  Went to urgent care and was told to come to the ED for higher level of care with concern for tendon involvement however it is documented the patient has full range of motion of his fingers during exam in urgent care.  His tetanus is up-to-date today.  He complains of some pain to the area, has no other complaints including weakness, numbness, tingling.  On exam patient has 3 cm laceration between second and third digits on right hand, bleeding is controlled.  He is able to fully flex and extend second and third digits as well as abduct and adduct fingers together.  No obvious tendon involvement at this time, unable to visualize tendon at this time.  We will plan for x-ray to assess for any fracture/open wounds.  Will  clean wound extensively with further evaluation of tendon and plan for sutures.  Xray with no bony involvement. Small radioopague  FBs seen, looks to be on the skin surface. Wound extensively irrigated with 1L NS pressure wash. No obvious FB appreciated. Small portion of tendon exposed however does not appear to be lacerated today. Pt tolerated suture placement without difficulty. Will discharge home at this time with hand follow up. Strict return precautions discussed including signs of infection. Pt instructed for return for suture removal. Pt is in agreement with plan and stable for discharge.   This note was prepared using Dragon voice recognition software and may include unintentional dictation errors due to the inherent limitations of voice recognition software.   Final Clinical Impression(s) / ED Diagnoses Final diagnoses:  Laceration of right hand, foreign body presence unspecified, initial encounter    Rx / DC Orders ED Discharge Orders    None       Discharge Instructions     Please follow up with Dr. Mingo Amber hand surgeon given your laceration with small amount of exposed tendon Keep wound clean and dry. Return to the ED in 10 days for suture removal.  Return to the ED IMMEDIATELY for signs of infection including redness/swelling around the wound, drainage of pus, fevers > 100.4, chills, or any other associated symptoms       Eustaquio Maize, PA-C 12/12/19 [place], [place], MD 12/14/19 [phone]

## 2019-12-12 NOTE — ED Notes (Signed)
ED Provider at bedside. 

## 2019-12-30 DIAGNOSIS — M9903 Segmental and somatic dysfunction of lumbar region: Secondary | ICD-10-CM | POA: Diagnosis not present

## 2019-12-30 DIAGNOSIS — M9901 Segmental and somatic dysfunction of cervical region: Secondary | ICD-10-CM | POA: Diagnosis not present

## 2019-12-30 DIAGNOSIS — M47812 Spondylosis without myelopathy or radiculopathy, cervical region: Secondary | ICD-10-CM | POA: Diagnosis not present

## 2019-12-30 DIAGNOSIS — M9902 Segmental and somatic dysfunction of thoracic region: Secondary | ICD-10-CM | POA: Diagnosis not present

## 2020-01-02 DIAGNOSIS — M9903 Segmental and somatic dysfunction of lumbar region: Secondary | ICD-10-CM | POA: Diagnosis not present

## 2020-01-02 DIAGNOSIS — M47812 Spondylosis without myelopathy or radiculopathy, cervical region: Secondary | ICD-10-CM | POA: Diagnosis not present

## 2020-01-02 DIAGNOSIS — M9902 Segmental and somatic dysfunction of thoracic region: Secondary | ICD-10-CM | POA: Diagnosis not present

## 2020-01-02 DIAGNOSIS — M9901 Segmental and somatic dysfunction of cervical region: Secondary | ICD-10-CM | POA: Diagnosis not present

## 2020-01-07 DIAGNOSIS — M47812 Spondylosis without myelopathy or radiculopathy, cervical region: Secondary | ICD-10-CM | POA: Diagnosis not present

## 2020-01-07 DIAGNOSIS — M9903 Segmental and somatic dysfunction of lumbar region: Secondary | ICD-10-CM | POA: Diagnosis not present

## 2020-01-07 DIAGNOSIS — M9902 Segmental and somatic dysfunction of thoracic region: Secondary | ICD-10-CM | POA: Diagnosis not present

## 2020-01-07 DIAGNOSIS — M9901 Segmental and somatic dysfunction of cervical region: Secondary | ICD-10-CM | POA: Diagnosis not present

## 2020-01-09 DIAGNOSIS — M9903 Segmental and somatic dysfunction of lumbar region: Secondary | ICD-10-CM | POA: Diagnosis not present

## 2020-01-09 DIAGNOSIS — M9902 Segmental and somatic dysfunction of thoracic region: Secondary | ICD-10-CM | POA: Diagnosis not present

## 2020-01-09 DIAGNOSIS — M9901 Segmental and somatic dysfunction of cervical region: Secondary | ICD-10-CM | POA: Diagnosis not present

## 2020-01-09 DIAGNOSIS — M47812 Spondylosis without myelopathy or radiculopathy, cervical region: Secondary | ICD-10-CM | POA: Diagnosis not present

## 2020-01-14 DIAGNOSIS — M9901 Segmental and somatic dysfunction of cervical region: Secondary | ICD-10-CM | POA: Diagnosis not present

## 2020-01-14 DIAGNOSIS — M47812 Spondylosis without myelopathy or radiculopathy, cervical region: Secondary | ICD-10-CM | POA: Diagnosis not present

## 2020-01-14 DIAGNOSIS — M9902 Segmental and somatic dysfunction of thoracic region: Secondary | ICD-10-CM | POA: Diagnosis not present

## 2020-01-14 DIAGNOSIS — M9903 Segmental and somatic dysfunction of lumbar region: Secondary | ICD-10-CM | POA: Diagnosis not present

## 2020-01-16 DIAGNOSIS — M9903 Segmental and somatic dysfunction of lumbar region: Secondary | ICD-10-CM | POA: Diagnosis not present

## 2020-01-16 DIAGNOSIS — M47812 Spondylosis without myelopathy or radiculopathy, cervical region: Secondary | ICD-10-CM | POA: Diagnosis not present

## 2020-01-16 DIAGNOSIS — M9901 Segmental and somatic dysfunction of cervical region: Secondary | ICD-10-CM | POA: Diagnosis not present

## 2020-01-16 DIAGNOSIS — M9902 Segmental and somatic dysfunction of thoracic region: Secondary | ICD-10-CM | POA: Diagnosis not present

## 2020-01-30 DIAGNOSIS — M9902 Segmental and somatic dysfunction of thoracic region: Secondary | ICD-10-CM | POA: Diagnosis not present

## 2020-01-30 DIAGNOSIS — M9903 Segmental and somatic dysfunction of lumbar region: Secondary | ICD-10-CM | POA: Diagnosis not present

## 2020-01-30 DIAGNOSIS — M47812 Spondylosis without myelopathy or radiculopathy, cervical region: Secondary | ICD-10-CM | POA: Diagnosis not present

## 2020-01-30 DIAGNOSIS — M9901 Segmental and somatic dysfunction of cervical region: Secondary | ICD-10-CM | POA: Diagnosis not present

## 2020-02-13 DIAGNOSIS — M47812 Spondylosis without myelopathy or radiculopathy, cervical region: Secondary | ICD-10-CM | POA: Diagnosis not present

## 2020-02-13 DIAGNOSIS — M9901 Segmental and somatic dysfunction of cervical region: Secondary | ICD-10-CM | POA: Diagnosis not present

## 2020-02-13 DIAGNOSIS — M9902 Segmental and somatic dysfunction of thoracic region: Secondary | ICD-10-CM | POA: Diagnosis not present

## 2020-02-13 DIAGNOSIS — M9903 Segmental and somatic dysfunction of lumbar region: Secondary | ICD-10-CM | POA: Diagnosis not present

## 2020-02-14 ENCOUNTER — Ambulatory Visit: Payer: PPO | Admitting: Nurse Practitioner

## 2020-02-20 DIAGNOSIS — M9903 Segmental and somatic dysfunction of lumbar region: Secondary | ICD-10-CM | POA: Diagnosis not present

## 2020-02-20 DIAGNOSIS — M9901 Segmental and somatic dysfunction of cervical region: Secondary | ICD-10-CM | POA: Diagnosis not present

## 2020-02-20 DIAGNOSIS — M9902 Segmental and somatic dysfunction of thoracic region: Secondary | ICD-10-CM | POA: Diagnosis not present

## 2020-02-20 DIAGNOSIS — M47812 Spondylosis without myelopathy or radiculopathy, cervical region: Secondary | ICD-10-CM | POA: Diagnosis not present

## 2020-02-25 DIAGNOSIS — M9903 Segmental and somatic dysfunction of lumbar region: Secondary | ICD-10-CM | POA: Diagnosis not present

## 2020-02-25 DIAGNOSIS — M47812 Spondylosis without myelopathy or radiculopathy, cervical region: Secondary | ICD-10-CM | POA: Diagnosis not present

## 2020-02-25 DIAGNOSIS — M9901 Segmental and somatic dysfunction of cervical region: Secondary | ICD-10-CM | POA: Diagnosis not present

## 2020-02-25 DIAGNOSIS — M9902 Segmental and somatic dysfunction of thoracic region: Secondary | ICD-10-CM | POA: Diagnosis not present

## 2020-02-27 DIAGNOSIS — M9903 Segmental and somatic dysfunction of lumbar region: Secondary | ICD-10-CM | POA: Diagnosis not present

## 2020-02-27 DIAGNOSIS — M9901 Segmental and somatic dysfunction of cervical region: Secondary | ICD-10-CM | POA: Diagnosis not present

## 2020-02-27 DIAGNOSIS — M47812 Spondylosis without myelopathy or radiculopathy, cervical region: Secondary | ICD-10-CM | POA: Diagnosis not present

## 2020-02-27 DIAGNOSIS — M9902 Segmental and somatic dysfunction of thoracic region: Secondary | ICD-10-CM | POA: Diagnosis not present

## 2020-03-05 DIAGNOSIS — M9902 Segmental and somatic dysfunction of thoracic region: Secondary | ICD-10-CM | POA: Diagnosis not present

## 2020-03-05 DIAGNOSIS — M9903 Segmental and somatic dysfunction of lumbar region: Secondary | ICD-10-CM | POA: Diagnosis not present

## 2020-03-05 DIAGNOSIS — M47812 Spondylosis without myelopathy or radiculopathy, cervical region: Secondary | ICD-10-CM | POA: Diagnosis not present

## 2020-03-05 DIAGNOSIS — M9901 Segmental and somatic dysfunction of cervical region: Secondary | ICD-10-CM | POA: Diagnosis not present

## 2020-03-12 DIAGNOSIS — M9903 Segmental and somatic dysfunction of lumbar region: Secondary | ICD-10-CM | POA: Diagnosis not present

## 2020-03-12 DIAGNOSIS — M47812 Spondylosis without myelopathy or radiculopathy, cervical region: Secondary | ICD-10-CM | POA: Diagnosis not present

## 2020-03-12 DIAGNOSIS — M9902 Segmental and somatic dysfunction of thoracic region: Secondary | ICD-10-CM | POA: Diagnosis not present

## 2020-03-12 DIAGNOSIS — M9901 Segmental and somatic dysfunction of cervical region: Secondary | ICD-10-CM | POA: Diagnosis not present

## 2020-03-24 DIAGNOSIS — M9901 Segmental and somatic dysfunction of cervical region: Secondary | ICD-10-CM | POA: Diagnosis not present

## 2020-03-24 DIAGNOSIS — M9903 Segmental and somatic dysfunction of lumbar region: Secondary | ICD-10-CM | POA: Diagnosis not present

## 2020-03-24 DIAGNOSIS — M9905 Segmental and somatic dysfunction of pelvic region: Secondary | ICD-10-CM | POA: Diagnosis not present

## 2020-03-24 DIAGNOSIS — M9904 Segmental and somatic dysfunction of sacral region: Secondary | ICD-10-CM | POA: Diagnosis not present

## 2020-03-31 DIAGNOSIS — M9903 Segmental and somatic dysfunction of lumbar region: Secondary | ICD-10-CM | POA: Diagnosis not present

## 2020-03-31 DIAGNOSIS — M9905 Segmental and somatic dysfunction of pelvic region: Secondary | ICD-10-CM | POA: Diagnosis not present

## 2020-03-31 DIAGNOSIS — M9901 Segmental and somatic dysfunction of cervical region: Secondary | ICD-10-CM | POA: Diagnosis not present

## 2020-03-31 DIAGNOSIS — M9904 Segmental and somatic dysfunction of sacral region: Secondary | ICD-10-CM | POA: Diagnosis not present

## 2020-04-02 DIAGNOSIS — M9901 Segmental and somatic dysfunction of cervical region: Secondary | ICD-10-CM | POA: Diagnosis not present

## 2020-04-02 DIAGNOSIS — M9904 Segmental and somatic dysfunction of sacral region: Secondary | ICD-10-CM | POA: Diagnosis not present

## 2020-04-02 DIAGNOSIS — M9903 Segmental and somatic dysfunction of lumbar region: Secondary | ICD-10-CM | POA: Diagnosis not present

## 2020-04-02 DIAGNOSIS — M9905 Segmental and somatic dysfunction of pelvic region: Secondary | ICD-10-CM | POA: Diagnosis not present

## 2020-04-09 DIAGNOSIS — M9903 Segmental and somatic dysfunction of lumbar region: Secondary | ICD-10-CM | POA: Diagnosis not present

## 2020-04-09 DIAGNOSIS — M9905 Segmental and somatic dysfunction of pelvic region: Secondary | ICD-10-CM | POA: Diagnosis not present

## 2020-04-09 DIAGNOSIS — M9901 Segmental and somatic dysfunction of cervical region: Secondary | ICD-10-CM | POA: Diagnosis not present

## 2020-04-09 DIAGNOSIS — M9904 Segmental and somatic dysfunction of sacral region: Secondary | ICD-10-CM | POA: Diagnosis not present

## 2020-04-16 DIAGNOSIS — M9903 Segmental and somatic dysfunction of lumbar region: Secondary | ICD-10-CM | POA: Diagnosis not present

## 2020-04-16 DIAGNOSIS — M9904 Segmental and somatic dysfunction of sacral region: Secondary | ICD-10-CM | POA: Diagnosis not present

## 2020-04-16 DIAGNOSIS — M9901 Segmental and somatic dysfunction of cervical region: Secondary | ICD-10-CM | POA: Diagnosis not present

## 2020-04-16 DIAGNOSIS — M9905 Segmental and somatic dysfunction of pelvic region: Secondary | ICD-10-CM | POA: Diagnosis not present

## 2020-04-23 DIAGNOSIS — M9905 Segmental and somatic dysfunction of pelvic region: Secondary | ICD-10-CM | POA: Diagnosis not present

## 2020-04-23 DIAGNOSIS — M9901 Segmental and somatic dysfunction of cervical region: Secondary | ICD-10-CM | POA: Diagnosis not present

## 2020-04-23 DIAGNOSIS — M9904 Segmental and somatic dysfunction of sacral region: Secondary | ICD-10-CM | POA: Diagnosis not present

## 2020-04-23 DIAGNOSIS — M9903 Segmental and somatic dysfunction of lumbar region: Secondary | ICD-10-CM | POA: Diagnosis not present

## 2020-04-30 DIAGNOSIS — M9904 Segmental and somatic dysfunction of sacral region: Secondary | ICD-10-CM | POA: Diagnosis not present

## 2020-04-30 DIAGNOSIS — M9903 Segmental and somatic dysfunction of lumbar region: Secondary | ICD-10-CM | POA: Diagnosis not present

## 2020-04-30 DIAGNOSIS — M9905 Segmental and somatic dysfunction of pelvic region: Secondary | ICD-10-CM | POA: Diagnosis not present

## 2020-04-30 DIAGNOSIS — M9901 Segmental and somatic dysfunction of cervical region: Secondary | ICD-10-CM | POA: Diagnosis not present

## 2020-05-07 DIAGNOSIS — M9903 Segmental and somatic dysfunction of lumbar region: Secondary | ICD-10-CM | POA: Diagnosis not present

## 2020-05-07 DIAGNOSIS — M9901 Segmental and somatic dysfunction of cervical region: Secondary | ICD-10-CM | POA: Diagnosis not present

## 2020-05-07 DIAGNOSIS — M9905 Segmental and somatic dysfunction of pelvic region: Secondary | ICD-10-CM | POA: Diagnosis not present

## 2020-05-07 DIAGNOSIS — M9904 Segmental and somatic dysfunction of sacral region: Secondary | ICD-10-CM | POA: Diagnosis not present

## 2020-05-14 DIAGNOSIS — M9901 Segmental and somatic dysfunction of cervical region: Secondary | ICD-10-CM | POA: Diagnosis not present

## 2020-05-14 DIAGNOSIS — M9905 Segmental and somatic dysfunction of pelvic region: Secondary | ICD-10-CM | POA: Diagnosis not present

## 2020-05-14 DIAGNOSIS — M9904 Segmental and somatic dysfunction of sacral region: Secondary | ICD-10-CM | POA: Diagnosis not present

## 2020-05-14 DIAGNOSIS — M9903 Segmental and somatic dysfunction of lumbar region: Secondary | ICD-10-CM | POA: Diagnosis not present

## 2020-05-21 DIAGNOSIS — M9901 Segmental and somatic dysfunction of cervical region: Secondary | ICD-10-CM | POA: Diagnosis not present

## 2020-05-21 DIAGNOSIS — M9903 Segmental and somatic dysfunction of lumbar region: Secondary | ICD-10-CM | POA: Diagnosis not present

## 2020-05-21 DIAGNOSIS — M9905 Segmental and somatic dysfunction of pelvic region: Secondary | ICD-10-CM | POA: Diagnosis not present

## 2020-05-21 DIAGNOSIS — M9904 Segmental and somatic dysfunction of sacral region: Secondary | ICD-10-CM | POA: Diagnosis not present

## 2020-05-28 DIAGNOSIS — M9904 Segmental and somatic dysfunction of sacral region: Secondary | ICD-10-CM | POA: Diagnosis not present

## 2020-05-28 DIAGNOSIS — M9903 Segmental and somatic dysfunction of lumbar region: Secondary | ICD-10-CM | POA: Diagnosis not present

## 2020-05-28 DIAGNOSIS — M9901 Segmental and somatic dysfunction of cervical region: Secondary | ICD-10-CM | POA: Diagnosis not present

## 2020-05-28 DIAGNOSIS — M9905 Segmental and somatic dysfunction of pelvic region: Secondary | ICD-10-CM | POA: Diagnosis not present

## 2020-06-03 DIAGNOSIS — M9904 Segmental and somatic dysfunction of sacral region: Secondary | ICD-10-CM | POA: Diagnosis not present

## 2020-06-03 DIAGNOSIS — M9903 Segmental and somatic dysfunction of lumbar region: Secondary | ICD-10-CM | POA: Diagnosis not present

## 2020-06-03 DIAGNOSIS — M9901 Segmental and somatic dysfunction of cervical region: Secondary | ICD-10-CM | POA: Diagnosis not present

## 2020-06-03 DIAGNOSIS — M9905 Segmental and somatic dysfunction of pelvic region: Secondary | ICD-10-CM | POA: Diagnosis not present

## 2020-06-11 DIAGNOSIS — M9905 Segmental and somatic dysfunction of pelvic region: Secondary | ICD-10-CM | POA: Diagnosis not present

## 2020-06-11 DIAGNOSIS — M9903 Segmental and somatic dysfunction of lumbar region: Secondary | ICD-10-CM | POA: Diagnosis not present

## 2020-06-11 DIAGNOSIS — M9901 Segmental and somatic dysfunction of cervical region: Secondary | ICD-10-CM | POA: Diagnosis not present

## 2020-06-11 DIAGNOSIS — M9904 Segmental and somatic dysfunction of sacral region: Secondary | ICD-10-CM | POA: Diagnosis not present

## 2020-06-18 DIAGNOSIS — M9905 Segmental and somatic dysfunction of pelvic region: Secondary | ICD-10-CM | POA: Diagnosis not present

## 2020-06-18 DIAGNOSIS — M9904 Segmental and somatic dysfunction of sacral region: Secondary | ICD-10-CM | POA: Diagnosis not present

## 2020-06-18 DIAGNOSIS — M9903 Segmental and somatic dysfunction of lumbar region: Secondary | ICD-10-CM | POA: Diagnosis not present

## 2020-06-18 DIAGNOSIS — M9901 Segmental and somatic dysfunction of cervical region: Secondary | ICD-10-CM | POA: Diagnosis not present

## 2020-06-25 DIAGNOSIS — M9901 Segmental and somatic dysfunction of cervical region: Secondary | ICD-10-CM | POA: Diagnosis not present

## 2020-06-25 DIAGNOSIS — M9904 Segmental and somatic dysfunction of sacral region: Secondary | ICD-10-CM | POA: Diagnosis not present

## 2020-06-25 DIAGNOSIS — M9903 Segmental and somatic dysfunction of lumbar region: Secondary | ICD-10-CM | POA: Diagnosis not present

## 2020-06-25 DIAGNOSIS — M9905 Segmental and somatic dysfunction of pelvic region: Secondary | ICD-10-CM | POA: Diagnosis not present

## 2020-07-02 DIAGNOSIS — M9903 Segmental and somatic dysfunction of lumbar region: Secondary | ICD-10-CM | POA: Diagnosis not present

## 2020-07-02 DIAGNOSIS — M9905 Segmental and somatic dysfunction of pelvic region: Secondary | ICD-10-CM | POA: Diagnosis not present

## 2020-07-02 DIAGNOSIS — M9904 Segmental and somatic dysfunction of sacral region: Secondary | ICD-10-CM | POA: Diagnosis not present

## 2020-07-02 DIAGNOSIS — M9901 Segmental and somatic dysfunction of cervical region: Secondary | ICD-10-CM | POA: Diagnosis not present

## 2020-07-16 DIAGNOSIS — M9905 Segmental and somatic dysfunction of pelvic region: Secondary | ICD-10-CM | POA: Diagnosis not present

## 2020-07-16 DIAGNOSIS — M9901 Segmental and somatic dysfunction of cervical region: Secondary | ICD-10-CM | POA: Diagnosis not present

## 2020-07-16 DIAGNOSIS — M9903 Segmental and somatic dysfunction of lumbar region: Secondary | ICD-10-CM | POA: Diagnosis not present

## 2020-07-16 DIAGNOSIS — M9904 Segmental and somatic dysfunction of sacral region: Secondary | ICD-10-CM | POA: Diagnosis not present

## 2020-07-22 ENCOUNTER — Encounter: Payer: PPO | Admitting: Adult Health

## 2020-07-30 DIAGNOSIS — M9903 Segmental and somatic dysfunction of lumbar region: Secondary | ICD-10-CM | POA: Diagnosis not present

## 2020-07-30 DIAGNOSIS — M9901 Segmental and somatic dysfunction of cervical region: Secondary | ICD-10-CM | POA: Diagnosis not present

## 2020-07-30 DIAGNOSIS — M9904 Segmental and somatic dysfunction of sacral region: Secondary | ICD-10-CM | POA: Diagnosis not present

## 2020-07-30 DIAGNOSIS — M9905 Segmental and somatic dysfunction of pelvic region: Secondary | ICD-10-CM | POA: Diagnosis not present

## 2020-08-13 DIAGNOSIS — M9903 Segmental and somatic dysfunction of lumbar region: Secondary | ICD-10-CM | POA: Diagnosis not present

## 2020-08-13 DIAGNOSIS — M9901 Segmental and somatic dysfunction of cervical region: Secondary | ICD-10-CM | POA: Diagnosis not present

## 2020-08-13 DIAGNOSIS — M9904 Segmental and somatic dysfunction of sacral region: Secondary | ICD-10-CM | POA: Diagnosis not present

## 2020-08-13 DIAGNOSIS — M9905 Segmental and somatic dysfunction of pelvic region: Secondary | ICD-10-CM | POA: Diagnosis not present

## 2020-08-27 DIAGNOSIS — M9904 Segmental and somatic dysfunction of sacral region: Secondary | ICD-10-CM | POA: Diagnosis not present

## 2020-08-27 DIAGNOSIS — M9905 Segmental and somatic dysfunction of pelvic region: Secondary | ICD-10-CM | POA: Diagnosis not present

## 2020-08-27 DIAGNOSIS — M9901 Segmental and somatic dysfunction of cervical region: Secondary | ICD-10-CM | POA: Diagnosis not present

## 2020-08-27 DIAGNOSIS — M9903 Segmental and somatic dysfunction of lumbar region: Secondary | ICD-10-CM | POA: Diagnosis not present

## 2020-09-10 DIAGNOSIS — M9901 Segmental and somatic dysfunction of cervical region: Secondary | ICD-10-CM | POA: Diagnosis not present

## 2020-09-10 DIAGNOSIS — M9905 Segmental and somatic dysfunction of pelvic region: Secondary | ICD-10-CM | POA: Diagnosis not present

## 2020-09-10 DIAGNOSIS — M9904 Segmental and somatic dysfunction of sacral region: Secondary | ICD-10-CM | POA: Diagnosis not present

## 2020-09-10 DIAGNOSIS — M9903 Segmental and somatic dysfunction of lumbar region: Secondary | ICD-10-CM | POA: Diagnosis not present

## 2020-09-22 ENCOUNTER — Encounter: Payer: PPO | Admitting: Adult Health

## 2020-10-02 DIAGNOSIS — M9904 Segmental and somatic dysfunction of sacral region: Secondary | ICD-10-CM | POA: Diagnosis not present

## 2020-10-02 DIAGNOSIS — M9901 Segmental and somatic dysfunction of cervical region: Secondary | ICD-10-CM | POA: Diagnosis not present

## 2020-10-02 DIAGNOSIS — M9903 Segmental and somatic dysfunction of lumbar region: Secondary | ICD-10-CM | POA: Diagnosis not present

## 2020-10-02 DIAGNOSIS — M9905 Segmental and somatic dysfunction of pelvic region: Secondary | ICD-10-CM | POA: Diagnosis not present

## 2020-10-16 DIAGNOSIS — M9901 Segmental and somatic dysfunction of cervical region: Secondary | ICD-10-CM | POA: Diagnosis not present

## 2020-10-16 DIAGNOSIS — M9903 Segmental and somatic dysfunction of lumbar region: Secondary | ICD-10-CM | POA: Diagnosis not present

## 2020-10-16 DIAGNOSIS — M9905 Segmental and somatic dysfunction of pelvic region: Secondary | ICD-10-CM | POA: Diagnosis not present

## 2020-10-16 DIAGNOSIS — M9904 Segmental and somatic dysfunction of sacral region: Secondary | ICD-10-CM | POA: Diagnosis not present

## 2020-11-03 DIAGNOSIS — M9901 Segmental and somatic dysfunction of cervical region: Secondary | ICD-10-CM | POA: Diagnosis not present

## 2020-11-03 DIAGNOSIS — M9905 Segmental and somatic dysfunction of pelvic region: Secondary | ICD-10-CM | POA: Diagnosis not present

## 2020-11-03 DIAGNOSIS — M9904 Segmental and somatic dysfunction of sacral region: Secondary | ICD-10-CM | POA: Diagnosis not present

## 2020-11-03 DIAGNOSIS — M9903 Segmental and somatic dysfunction of lumbar region: Secondary | ICD-10-CM | POA: Diagnosis not present

## 2020-11-19 DIAGNOSIS — M9904 Segmental and somatic dysfunction of sacral region: Secondary | ICD-10-CM | POA: Diagnosis not present

## 2020-11-19 DIAGNOSIS — M9903 Segmental and somatic dysfunction of lumbar region: Secondary | ICD-10-CM | POA: Diagnosis not present

## 2020-11-19 DIAGNOSIS — M9901 Segmental and somatic dysfunction of cervical region: Secondary | ICD-10-CM | POA: Diagnosis not present

## 2020-11-19 DIAGNOSIS — M9905 Segmental and somatic dysfunction of pelvic region: Secondary | ICD-10-CM | POA: Diagnosis not present

## 2020-12-02 DIAGNOSIS — E119 Type 2 diabetes mellitus without complications: Secondary | ICD-10-CM | POA: Diagnosis not present

## 2020-12-02 DIAGNOSIS — I428 Other cardiomyopathies: Secondary | ICD-10-CM | POA: Diagnosis not present

## 2020-12-02 DIAGNOSIS — Z9989 Dependence on other enabling machines and devices: Secondary | ICD-10-CM | POA: Diagnosis not present

## 2020-12-02 DIAGNOSIS — E785 Hyperlipidemia, unspecified: Secondary | ICD-10-CM | POA: Diagnosis not present

## 2020-12-02 DIAGNOSIS — Z0000 Encounter for general adult medical examination without abnormal findings: Secondary | ICD-10-CM | POA: Diagnosis not present

## 2020-12-02 DIAGNOSIS — G4733 Obstructive sleep apnea (adult) (pediatric): Secondary | ICD-10-CM | POA: Diagnosis not present

## 2020-12-02 DIAGNOSIS — E039 Hypothyroidism, unspecified: Secondary | ICD-10-CM | POA: Diagnosis not present

## 2020-12-02 DIAGNOSIS — I10 Essential (primary) hypertension: Secondary | ICD-10-CM | POA: Diagnosis not present

## 2020-12-02 DIAGNOSIS — Z6836 Body mass index (BMI) 36.0-36.9, adult: Secondary | ICD-10-CM | POA: Diagnosis not present

## 2020-12-03 DIAGNOSIS — M9905 Segmental and somatic dysfunction of pelvic region: Secondary | ICD-10-CM | POA: Diagnosis not present

## 2020-12-03 DIAGNOSIS — M9904 Segmental and somatic dysfunction of sacral region: Secondary | ICD-10-CM | POA: Diagnosis not present

## 2020-12-03 DIAGNOSIS — M9903 Segmental and somatic dysfunction of lumbar region: Secondary | ICD-10-CM | POA: Diagnosis not present

## 2020-12-03 DIAGNOSIS — M9901 Segmental and somatic dysfunction of cervical region: Secondary | ICD-10-CM | POA: Diagnosis not present

## 2020-12-04 ENCOUNTER — Other Ambulatory Visit: Payer: Self-pay | Admitting: Infectious Diseases

## 2020-12-04 DIAGNOSIS — N179 Acute kidney failure, unspecified: Secondary | ICD-10-CM

## 2020-12-07 DIAGNOSIS — N179 Acute kidney failure, unspecified: Secondary | ICD-10-CM | POA: Diagnosis not present

## 2020-12-07 DIAGNOSIS — Z114 Encounter for screening for human immunodeficiency virus [HIV]: Secondary | ICD-10-CM | POA: Diagnosis not present

## 2020-12-16 ENCOUNTER — Ambulatory Visit
Admission: RE | Admit: 2020-12-16 | Discharge: 2020-12-16 | Disposition: A | Discharge status: Home / Self Care | Payer: PPO | Source: Ambulatory Visit | Attending: Infectious Diseases | Admitting: Infectious Diseases

## 2020-12-16 ENCOUNTER — Other Ambulatory Visit: Payer: Self-pay

## 2020-12-16 ENCOUNTER — Other Ambulatory Visit
Admission: RE | Admit: 2020-12-16 | Discharge: 2020-12-16 | Disposition: A | Discharge status: Home / Self Care | Payer: PPO | Source: Ambulatory Visit | Attending: Infectious Diseases | Admitting: Infectious Diseases

## 2020-12-16 DIAGNOSIS — E785 Hyperlipidemia, unspecified: Secondary | ICD-10-CM | POA: Diagnosis not present

## 2020-12-16 DIAGNOSIS — N179 Acute kidney failure, unspecified: Secondary | ICD-10-CM | POA: Insufficient documentation

## 2020-12-16 DIAGNOSIS — I428 Other cardiomyopathies: Secondary | ICD-10-CM | POA: Insufficient documentation

## 2020-12-16 DIAGNOSIS — E119 Type 2 diabetes mellitus without complications: Secondary | ICD-10-CM | POA: Diagnosis not present

## 2020-12-16 DIAGNOSIS — E1159 Type 2 diabetes mellitus with other circulatory complications: Secondary | ICD-10-CM | POA: Diagnosis not present

## 2020-12-16 DIAGNOSIS — G4733 Obstructive sleep apnea (adult) (pediatric): Secondary | ICD-10-CM | POA: Diagnosis not present

## 2020-12-16 DIAGNOSIS — N2889 Other specified disorders of kidney and ureter: Secondary | ICD-10-CM | POA: Diagnosis not present

## 2020-12-16 DIAGNOSIS — Z6836 Body mass index (BMI) 36.0-36.9, adult: Secondary | ICD-10-CM | POA: Diagnosis not present

## 2020-12-16 DIAGNOSIS — Z9989 Dependence on other enabling machines and devices: Secondary | ICD-10-CM | POA: Diagnosis not present

## 2020-12-16 DIAGNOSIS — M109 Gout, unspecified: Secondary | ICD-10-CM | POA: Diagnosis not present

## 2020-12-16 DIAGNOSIS — N184 Chronic kidney disease, stage 4 (severe): Secondary | ICD-10-CM | POA: Diagnosis not present

## 2020-12-16 LAB — BRAIN NATRIURETIC PEPTIDE: B Natriuretic Peptide: 108.1 pg/mL — ABNORMAL HIGH (ref 0.0–100.0)

## 2020-12-31 DIAGNOSIS — M9904 Segmental and somatic dysfunction of sacral region: Secondary | ICD-10-CM | POA: Diagnosis not present

## 2020-12-31 DIAGNOSIS — M9905 Segmental and somatic dysfunction of pelvic region: Secondary | ICD-10-CM | POA: Diagnosis not present

## 2020-12-31 DIAGNOSIS — M9901 Segmental and somatic dysfunction of cervical region: Secondary | ICD-10-CM | POA: Diagnosis not present

## 2020-12-31 DIAGNOSIS — M9903 Segmental and somatic dysfunction of lumbar region: Secondary | ICD-10-CM | POA: Diagnosis not present

## 2021-01-12 DIAGNOSIS — R82998 Other abnormal findings in urine: Secondary | ICD-10-CM | POA: Diagnosis not present

## 2021-01-12 DIAGNOSIS — R319 Hematuria, unspecified: Secondary | ICD-10-CM | POA: Diagnosis not present

## 2021-01-12 DIAGNOSIS — R801 Persistent proteinuria, unspecified: Secondary | ICD-10-CM | POA: Diagnosis not present

## 2021-01-12 DIAGNOSIS — N179 Acute kidney failure, unspecified: Secondary | ICD-10-CM | POA: Diagnosis not present

## 2021-01-12 DIAGNOSIS — E790 Hyperuricemia without signs of inflammatory arthritis and tophaceous disease: Secondary | ICD-10-CM | POA: Diagnosis not present

## 2021-01-14 DIAGNOSIS — M9905 Segmental and somatic dysfunction of pelvic region: Secondary | ICD-10-CM | POA: Diagnosis not present

## 2021-01-14 DIAGNOSIS — M9903 Segmental and somatic dysfunction of lumbar region: Secondary | ICD-10-CM | POA: Diagnosis not present

## 2021-01-14 DIAGNOSIS — M9904 Segmental and somatic dysfunction of sacral region: Secondary | ICD-10-CM | POA: Diagnosis not present

## 2021-01-14 DIAGNOSIS — M9901 Segmental and somatic dysfunction of cervical region: Secondary | ICD-10-CM | POA: Diagnosis not present

## 2021-02-17 DIAGNOSIS — N059 Unspecified nephritic syndrome with unspecified morphologic changes: Secondary | ICD-10-CM | POA: Diagnosis not present

## 2021-04-19 ENCOUNTER — Emergency Department: Payer: PPO

## 2021-04-19 ENCOUNTER — Inpatient Hospital Stay
Admission: EM | Admit: 2021-04-19 | Discharge: 2021-04-30 | DRG: 871 | Disposition: A | Discharge status: Home Health | Payer: PPO | Source: Ambulatory Visit | Attending: Internal Medicine | Admitting: Internal Medicine

## 2021-04-19 ENCOUNTER — Other Ambulatory Visit: Payer: Self-pay

## 2021-04-19 DIAGNOSIS — N400 Enlarged prostate without lower urinary tract symptoms: Secondary | ICD-10-CM | POA: Diagnosis not present

## 2021-04-19 DIAGNOSIS — I428 Other cardiomyopathies: Secondary | ICD-10-CM | POA: Diagnosis not present

## 2021-04-19 DIAGNOSIS — R578 Other shock: Secondary | ICD-10-CM | POA: Diagnosis not present

## 2021-04-19 DIAGNOSIS — R410 Disorientation, unspecified: Secondary | ICD-10-CM | POA: Diagnosis present

## 2021-04-19 DIAGNOSIS — N189 Chronic kidney disease, unspecified: Secondary | ICD-10-CM | POA: Diagnosis not present

## 2021-04-19 DIAGNOSIS — I4891 Unspecified atrial fibrillation: Secondary | ICD-10-CM | POA: Diagnosis not present

## 2021-04-19 DIAGNOSIS — Z79890 Hormone replacement therapy (postmenopausal): Secondary | ICD-10-CM

## 2021-04-19 DIAGNOSIS — F05 Delirium due to known physiological condition: Secondary | ICD-10-CM | POA: Diagnosis not present

## 2021-04-19 DIAGNOSIS — Z6836 Body mass index (BMI) 36.0-36.9, adult: Secondary | ICD-10-CM | POA: Diagnosis not present

## 2021-04-19 DIAGNOSIS — E1165 Type 2 diabetes mellitus with hyperglycemia: Secondary | ICD-10-CM | POA: Diagnosis not present

## 2021-04-19 DIAGNOSIS — R059 Cough, unspecified: Secondary | ICD-10-CM | POA: Diagnosis not present

## 2021-04-19 DIAGNOSIS — E872 Acidosis, unspecified: Secondary | ICD-10-CM | POA: Diagnosis present

## 2021-04-19 DIAGNOSIS — N019 Rapidly progressive nephritic syndrome with unspecified morphologic changes: Secondary | ICD-10-CM | POA: Diagnosis not present

## 2021-04-19 DIAGNOSIS — R6520 Severe sepsis without septic shock: Secondary | ICD-10-CM | POA: Diagnosis not present

## 2021-04-19 DIAGNOSIS — R066 Hiccough: Secondary | ICD-10-CM | POA: Diagnosis not present

## 2021-04-19 DIAGNOSIS — J9601 Acute respiratory failure with hypoxia: Secondary | ICD-10-CM | POA: Diagnosis not present

## 2021-04-19 DIAGNOSIS — Z992 Dependence on renal dialysis: Secondary | ICD-10-CM | POA: Diagnosis not present

## 2021-04-19 DIAGNOSIS — I5082 Biventricular heart failure: Secondary | ICD-10-CM | POA: Diagnosis present

## 2021-04-19 DIAGNOSIS — E8721 Acute metabolic acidosis: Secondary | ICD-10-CM | POA: Diagnosis not present

## 2021-04-19 DIAGNOSIS — D72829 Elevated white blood cell count, unspecified: Secondary | ICD-10-CM | POA: Diagnosis not present

## 2021-04-19 DIAGNOSIS — I2510 Atherosclerotic heart disease of native coronary artery without angina pectoris: Secondary | ICD-10-CM | POA: Diagnosis present

## 2021-04-19 DIAGNOSIS — E860 Dehydration: Secondary | ICD-10-CM | POA: Diagnosis present

## 2021-04-19 DIAGNOSIS — E875 Hyperkalemia: Secondary | ICD-10-CM | POA: Diagnosis not present

## 2021-04-19 DIAGNOSIS — N179 Acute kidney failure, unspecified: Secondary | ICD-10-CM

## 2021-04-19 DIAGNOSIS — Z20822 Contact with and (suspected) exposure to covid-19: Secondary | ICD-10-CM | POA: Diagnosis present

## 2021-04-19 DIAGNOSIS — J189 Pneumonia, unspecified organism: Secondary | ICD-10-CM | POA: Diagnosis not present

## 2021-04-19 DIAGNOSIS — R338 Other retention of urine: Secondary | ICD-10-CM | POA: Diagnosis not present

## 2021-04-19 DIAGNOSIS — J180 Bronchopneumonia, unspecified organism: Secondary | ICD-10-CM | POA: Diagnosis present

## 2021-04-19 DIAGNOSIS — N401 Enlarged prostate with lower urinary tract symptoms: Secondary | ICD-10-CM | POA: Diagnosis present

## 2021-04-19 DIAGNOSIS — Z8249 Family history of ischemic heart disease and other diseases of the circulatory system: Secondary | ICD-10-CM

## 2021-04-19 DIAGNOSIS — E8779 Other fluid overload: Secondary | ICD-10-CM | POA: Diagnosis not present

## 2021-04-19 DIAGNOSIS — Z6826 Body mass index (BMI) 26.0-26.9, adult: Secondary | ICD-10-CM | POA: Diagnosis not present

## 2021-04-19 DIAGNOSIS — A419 Sepsis, unspecified organism: Principal | ICD-10-CM | POA: Diagnosis present

## 2021-04-19 DIAGNOSIS — K921 Melena: Secondary | ICD-10-CM | POA: Diagnosis not present

## 2021-04-19 DIAGNOSIS — I509 Heart failure, unspecified: Secondary | ICD-10-CM

## 2021-04-19 DIAGNOSIS — T39395A Adverse effect of other nonsteroidal anti-inflammatory drugs [NSAID], initial encounter: Secondary | ICD-10-CM | POA: Diagnosis present

## 2021-04-19 DIAGNOSIS — N186 End stage renal disease: Secondary | ICD-10-CM | POA: Diagnosis not present

## 2021-04-19 DIAGNOSIS — I482 Chronic atrial fibrillation, unspecified: Secondary | ICD-10-CM | POA: Diagnosis present

## 2021-04-19 DIAGNOSIS — J9811 Atelectasis: Secondary | ICD-10-CM | POA: Diagnosis not present

## 2021-04-19 DIAGNOSIS — I10 Essential (primary) hypertension: Secondary | ICD-10-CM | POA: Diagnosis present

## 2021-04-19 DIAGNOSIS — N170 Acute kidney failure with tubular necrosis: Secondary | ICD-10-CM | POA: Diagnosis not present

## 2021-04-19 DIAGNOSIS — N289 Disorder of kidney and ureter, unspecified: Secondary | ICD-10-CM | POA: Diagnosis not present

## 2021-04-19 DIAGNOSIS — I132 Hypertensive heart and chronic kidney disease with heart failure and with stage 5 chronic kidney disease, or end stage renal disease: Secondary | ICD-10-CM | POA: Diagnosis present

## 2021-04-19 DIAGNOSIS — E119 Type 2 diabetes mellitus without complications: Secondary | ICD-10-CM | POA: Diagnosis not present

## 2021-04-19 DIAGNOSIS — D849 Immunodeficiency, unspecified: Secondary | ICD-10-CM | POA: Diagnosis not present

## 2021-04-19 DIAGNOSIS — I7782 Antineutrophilic cytoplasmic antibody (ANCA) vasculitis: Secondary | ICD-10-CM | POA: Diagnosis not present

## 2021-04-19 DIAGNOSIS — E1122 Type 2 diabetes mellitus with diabetic chronic kidney disease: Secondary | ICD-10-CM | POA: Diagnosis present

## 2021-04-19 DIAGNOSIS — E440 Moderate protein-calorie malnutrition: Secondary | ICD-10-CM | POA: Diagnosis not present

## 2021-04-19 DIAGNOSIS — R053 Chronic cough: Secondary | ICD-10-CM | POA: Diagnosis not present

## 2021-04-19 DIAGNOSIS — I5022 Chronic systolic (congestive) heart failure: Secondary | ICD-10-CM | POA: Diagnosis present

## 2021-04-19 DIAGNOSIS — K5730 Diverticulosis of large intestine without perforation or abscess without bleeding: Secondary | ICD-10-CM | POA: Diagnosis not present

## 2021-04-19 DIAGNOSIS — N184 Chronic kidney disease, stage 4 (severe): Secondary | ICD-10-CM

## 2021-04-19 DIAGNOSIS — N059 Unspecified nephritic syndrome with unspecified morphologic changes: Secondary | ICD-10-CM | POA: Diagnosis not present

## 2021-04-19 DIAGNOSIS — N017 Rapidly progressive nephritic syndrome with diffuse crescentic glomerulonephritis: Secondary | ICD-10-CM | POA: Diagnosis not present

## 2021-04-19 DIAGNOSIS — Z87448 Personal history of other diseases of urinary system: Secondary | ICD-10-CM

## 2021-04-19 DIAGNOSIS — Z885 Allergy status to narcotic agent status: Secondary | ICD-10-CM

## 2021-04-19 DIAGNOSIS — E785 Hyperlipidemia, unspecified: Secondary | ICD-10-CM | POA: Diagnosis present

## 2021-04-19 DIAGNOSIS — Z28310 Unvaccinated for covid-19: Secondary | ICD-10-CM

## 2021-04-19 DIAGNOSIS — E1159 Type 2 diabetes mellitus with other circulatory complications: Secondary | ICD-10-CM | POA: Diagnosis not present

## 2021-04-19 DIAGNOSIS — E1100 Type 2 diabetes mellitus with hyperosmolarity without nonketotic hyperglycemic-hyperosmolar coma (NKHHC): Secondary | ICD-10-CM

## 2021-04-19 DIAGNOSIS — E039 Hypothyroidism, unspecified: Secondary | ICD-10-CM | POA: Diagnosis not present

## 2021-04-19 DIAGNOSIS — T380X5A Adverse effect of glucocorticoids and synthetic analogues, initial encounter: Secondary | ICD-10-CM | POA: Diagnosis not present

## 2021-04-19 DIAGNOSIS — Z833 Family history of diabetes mellitus: Secondary | ICD-10-CM

## 2021-04-19 DIAGNOSIS — Z87891 Personal history of nicotine dependence: Secondary | ICD-10-CM

## 2021-04-19 DIAGNOSIS — E038 Other specified hypothyroidism: Secondary | ICD-10-CM | POA: Diagnosis present

## 2021-04-19 DIAGNOSIS — R112 Nausea with vomiting, unspecified: Secondary | ICD-10-CM | POA: Diagnosis present

## 2021-04-19 DIAGNOSIS — M109 Gout, unspecified: Secondary | ICD-10-CM | POA: Diagnosis present

## 2021-04-19 DIAGNOSIS — E291 Testicular hypofunction: Secondary | ICD-10-CM | POA: Diagnosis present

## 2021-04-19 DIAGNOSIS — I776 Arteritis, unspecified: Secondary | ICD-10-CM | POA: Diagnosis not present

## 2021-04-19 DIAGNOSIS — I152 Hypertension secondary to endocrine disorders: Secondary | ICD-10-CM | POA: Diagnosis present

## 2021-04-19 DIAGNOSIS — R197 Diarrhea, unspecified: Secondary | ICD-10-CM | POA: Diagnosis present

## 2021-04-19 DIAGNOSIS — E8729 Other acidosis: Secondary | ICD-10-CM | POA: Insufficient documentation

## 2021-04-19 DIAGNOSIS — R040 Epistaxis: Secondary | ICD-10-CM | POA: Diagnosis present

## 2021-04-19 DIAGNOSIS — R079 Chest pain, unspecified: Secondary | ICD-10-CM | POA: Diagnosis not present

## 2021-04-19 DIAGNOSIS — R3129 Other microscopic hematuria: Secondary | ICD-10-CM | POA: Diagnosis present

## 2021-04-19 DIAGNOSIS — Z79899 Other long term (current) drug therapy: Secondary | ICD-10-CM

## 2021-04-19 DIAGNOSIS — R451 Restlessness and agitation: Secondary | ICD-10-CM | POA: Diagnosis not present

## 2021-04-19 HISTORY — DX: Pneumonia, unspecified organism: J18.9

## 2021-04-19 HISTORY — DX: Acute kidney failure, unspecified: N17.9

## 2021-04-19 HISTORY — DX: Sepsis, unspecified organism: A41.9

## 2021-04-19 HISTORY — DX: Chronic kidney disease, stage 4 (severe): N18.4

## 2021-04-19 HISTORY — DX: Hyperkalemia: E87.5

## 2021-04-19 LAB — RESP PANEL BY RT-PCR (FLU A&B, COVID) ARPGX2
Influenza A by PCR: NEGATIVE
Influenza B by PCR: NEGATIVE
SARS Coronavirus 2 by RT PCR: NEGATIVE

## 2021-04-19 LAB — PROCALCITONIN: Procalcitonin: 0.83 ng/mL

## 2021-04-19 LAB — CBC WITH DIFFERENTIAL/PLATELET
Abs Immature Granulocytes: 0.09 10*3/uL — ABNORMAL HIGH (ref 0.00–0.07)
Basophils Absolute: 0 10*3/uL (ref 0.0–0.1)
Basophils Relative: 0 %
Eosinophils Absolute: 0.1 10*3/uL (ref 0.0–0.5)
Eosinophils Relative: 1 %
HCT: 40.5 % (ref 39.0–52.0)
Hemoglobin: 13.3 g/dL (ref 13.0–17.0)
Immature Granulocytes: 1 %
Lymphocytes Relative: 4 %
Lymphs Abs: 0.6 10*3/uL — ABNORMAL LOW (ref 0.7–4.0)
MCH: 28.5 pg (ref 26.0–34.0)
MCHC: 32.8 g/dL (ref 30.0–36.0)
MCV: 86.9 fL (ref 80.0–100.0)
Monocytes Absolute: 0.6 10*3/uL (ref 0.1–1.0)
Monocytes Relative: 4 %
Neutro Abs: 14.3 10*3/uL — ABNORMAL HIGH (ref 1.7–7.7)
Neutrophils Relative %: 90 %
Platelets: 267 10*3/uL (ref 150–400)
RBC: 4.66 MIL/uL (ref 4.22–5.81)
RDW: 14.6 % (ref 11.5–15.5)
WBC: 15.7 10*3/uL — ABNORMAL HIGH (ref 4.0–10.5)
nRBC: 0 % (ref 0.0–0.2)

## 2021-04-19 LAB — COMPREHENSIVE METABOLIC PANEL
ALT: 9 U/L (ref 0–44)
AST: 11 U/L — ABNORMAL LOW (ref 15–41)
Albumin: 3.7 g/dL (ref 3.5–5.0)
Alkaline Phosphatase: 68 U/L (ref 38–126)
Anion gap: 24 — ABNORMAL HIGH (ref 5–15)
BUN: 247 mg/dL — ABNORMAL HIGH (ref 8–23)
CO2: 8 mmol/L — ABNORMAL LOW (ref 22–32)
Calcium: 9.6 mg/dL (ref 8.9–10.3)
Chloride: 103 mmol/L (ref 98–111)
Creatinine, Ser: 15.05 mg/dL — ABNORMAL HIGH (ref 0.61–1.24)
GFR, Estimated: 3 mL/min — ABNORMAL LOW (ref >60)
Glucose, Bld: 116 mg/dL — ABNORMAL HIGH (ref 70–99)
Potassium: 6.8 mmol/L (ref 3.5–5.1)
Sodium: 135 mmol/L (ref 135–145)
Total Bilirubin: 1 mg/dL (ref 0.3–1.2)
Total Protein: 8.1 g/dL (ref 6.5–8.1)

## 2021-04-19 LAB — LACTIC ACID, PLASMA
Lactic Acid, Venous: 1.2 mmol/L (ref 0.5–1.9)
Lactic Acid, Venous: 1 mmol/L (ref 0.5–1.9)

## 2021-04-19 LAB — MAGNESIUM: Magnesium: 3.2 mg/dL — ABNORMAL HIGH (ref 1.7–2.4)

## 2021-04-19 LAB — LIPASE, BLOOD: Lipase: 88 U/L — ABNORMAL HIGH (ref 11–51)

## 2021-04-19 MED ORDER — ALBUTEROL SULFATE (2.5 MG/3ML) 0.083% IN NEBU
10.0000 mg | INHALATION_SOLUTION | Freq: Once | RESPIRATORY_TRACT | Status: AC
  Administered 2021-04-19: 10 mg via RESPIRATORY_TRACT
  Filled 2021-04-19: qty 12

## 2021-04-19 MED ORDER — METOPROLOL TARTRATE 5 MG/5ML IV SOLN: 5.0000 mg | INTRAVENOUS | Status: DC | PRN

## 2021-04-19 MED ORDER — INSULIN ASPART 100 UNIT/ML IV SOLN
10.0000 [IU] | Freq: Once | INTRAVENOUS | Status: AC
  Administered 2021-04-19: 10 [IU] via INTRAVENOUS
  Filled 2021-04-19: qty 0.1

## 2021-04-19 MED ORDER — SODIUM CHLORIDE 0.9 % IV BOLUS
1000.0000 mL | Freq: Once | INTRAVENOUS | Status: AC
Start: 2021-04-19 — End: 2021-04-19
  Administered 2021-04-19: 1000 mL via INTRAVENOUS

## 2021-04-19 MED ORDER — VANCOMYCIN VARIABLE DOSE PER UNSTABLE RENAL FUNCTION (PHARMACIST DOSING): Status: DC

## 2021-04-19 MED ORDER — LEVOTHYROXINE SODIUM 50 MCG PO TABS
25.0000 ug | ORAL_TABLET | Freq: Every day | ORAL | Status: DC
  Administered 2021-04-20 – 2021-04-30 (×9): 25 ug via ORAL
  Filled 2021-04-19 (×11): qty 1

## 2021-04-19 MED ORDER — SODIUM CHLORIDE 0.9 % IV SOLN
500.0000 mg | Freq: Once | INTRAVENOUS | Status: AC
  Administered 2021-04-19: 500 mg via INTRAVENOUS
  Filled 2021-04-19: qty 5

## 2021-04-19 MED ORDER — ALBUTEROL SULFATE (2.5 MG/3ML) 0.083% IN NEBU
2.5000 mg | INHALATION_SOLUTION | Freq: Four times a day (QID) | RESPIRATORY_TRACT | Status: DC | PRN
  Filled 2021-04-19: qty 3

## 2021-04-19 MED ORDER — ONDANSETRON HCL 4 MG/2ML IJ SOLN
4.0000 mg | Freq: Four times a day (QID) | INTRAMUSCULAR | Status: DC | PRN
  Administered 2021-04-19 – 2021-04-30 (×4): 4 mg via INTRAVENOUS
  Filled 2021-04-19 (×5): qty 2

## 2021-04-19 MED ORDER — METRONIDAZOLE 500 MG/100ML IV SOLN
500.0000 mg | Freq: Two times a day (BID) | INTRAVENOUS | Status: DC
  Administered 2021-04-20 (×2): 500 mg via INTRAVENOUS
  Filled 2021-04-19 (×2): qty 100

## 2021-04-19 MED ORDER — VANCOMYCIN HCL 2000 MG/400ML IV SOLN
2000.0000 mg | Freq: Once | INTRAVENOUS | Status: AC
  Administered 2021-04-20: 2000 mg via INTRAVENOUS
  Filled 2021-04-19 (×2): qty 400

## 2021-04-19 MED ORDER — ACETAMINOPHEN 325 MG PO TABS
650.0000 mg | ORAL_TABLET | Freq: Four times a day (QID) | ORAL | Status: DC | PRN
  Administered 2021-04-20 – 2021-04-29 (×9): 650 mg via ORAL
  Filled 2021-04-19 (×8): qty 2

## 2021-04-19 MED ORDER — INSULIN ASPART 100 UNIT/ML IJ SOLN
0.0000 [IU] | Freq: Three times a day (TID) | INTRAMUSCULAR | Status: DC
  Administered 2021-04-22: 2 [IU] via SUBCUTANEOUS
  Administered 2021-04-26: 3 [IU] via SUBCUTANEOUS
  Administered 2021-04-26 – 2021-04-27 (×2): 2 [IU] via SUBCUTANEOUS
  Administered 2021-04-27 (×2): 3 [IU] via SUBCUTANEOUS
  Administered 2021-04-28: 5 [IU] via SUBCUTANEOUS
  Administered 2021-04-28: 3 [IU] via SUBCUTANEOUS
  Administered 2021-04-28: 1 [IU] via SUBCUTANEOUS
  Administered 2021-04-29: 3 [IU] via SUBCUTANEOUS
  Administered 2021-04-29: 2 [IU] via SUBCUTANEOUS
  Administered 2021-04-30 (×2): 1 [IU] via SUBCUTANEOUS
  Filled 2021-04-19 (×15): qty 1

## 2021-04-19 MED ORDER — ACETAMINOPHEN 650 MG RE SUPP: 650.0000 mg | Freq: Four times a day (QID) | RECTAL | Status: DC | PRN

## 2021-04-19 MED ORDER — HEPARIN SODIUM (PORCINE) 5000 UNIT/ML IJ SOLN
5000.0000 [IU] | Freq: Three times a day (TID) | INTRAMUSCULAR | Status: DC
  Administered 2021-04-20 – 2021-04-23 (×8): 5000 [IU] via SUBCUTANEOUS
  Filled 2021-04-19 (×9): qty 1

## 2021-04-19 MED ORDER — CALCIUM GLUCONATE 10 % IV SOLN
1.0000 g | Freq: Once | INTRAVENOUS | Status: AC
  Administered 2021-04-19: 1 g via INTRAVENOUS
  Filled 2021-04-19: qty 10

## 2021-04-19 MED ORDER — SODIUM ZIRCONIUM CYCLOSILICATE 10 G PO PACK
10.0000 g | PACK | Freq: Once | ORAL | Status: AC
  Administered 2021-04-19: 10 g via ORAL
  Filled 2021-04-19: qty 1

## 2021-04-19 MED ORDER — INSULIN ASPART 100 UNIT/ML IJ SOLN
0.0000 [IU] | Freq: Every day | INTRAMUSCULAR | Status: DC
  Administered 2021-04-25 – 2021-04-26 (×2): 2 [IU] via SUBCUTANEOUS
  Administered 2021-04-27 – 2021-04-29 (×2): 3 [IU] via SUBCUTANEOUS
  Filled 2021-04-19 (×4): qty 1

## 2021-04-19 MED ORDER — BUDESONIDE 0.5 MG/2ML IN SUSP: 0.5000 mg | Freq: Four times a day (QID) | RESPIRATORY_TRACT | Status: DC | PRN

## 2021-04-19 MED ORDER — ONDANSETRON HCL 4 MG PO TABS
4.0000 mg | ORAL_TABLET | Freq: Four times a day (QID) | ORAL | Status: DC | PRN
  Filled 2021-04-19 (×2): qty 1

## 2021-04-19 MED ORDER — DEXTROSE 50 % IV SOLN
1.0000 | Freq: Once | INTRAVENOUS | Status: AC
  Administered 2021-04-19: 50 mL via INTRAVENOUS
  Filled 2021-04-19: qty 50

## 2021-04-19 MED ORDER — ONDANSETRON HCL 4 MG/2ML IJ SOLN
4.0000 mg | Freq: Once | INTRAMUSCULAR | Status: AC
  Administered 2021-04-19: 4 mg via INTRAVENOUS
  Filled 2021-04-19: qty 2

## 2021-04-19 MED ORDER — SODIUM CHLORIDE 0.9 % IV SOLN
2.0000 g | Freq: Once | INTRAVENOUS | Status: DC
  Filled 2021-04-19: qty 2

## 2021-04-19 MED ORDER — SODIUM CHLORIDE 0.9 % IV BOLUS
1000.0000 mL | Freq: Once | INTRAVENOUS | Status: AC
  Administered 2021-04-19: 1000 mL via INTRAVENOUS

## 2021-04-19 MED ORDER — SODIUM BICARBONATE 8.4 % IV SOLN
INTRAVENOUS | Status: AC
  Filled 2021-04-19: qty 1000
  Filled 2021-04-19 (×2): qty 150

## 2021-04-19 MED ORDER — SODIUM CHLORIDE 0.9 % IV SOLN
1.0000 g | INTRAVENOUS | Status: DC
  Administered 2021-04-20: 1 g via INTRAVENOUS
  Filled 2021-04-19 (×2): qty 1

## 2021-04-19 MED ORDER — MORPHINE SULFATE (PF) 4 MG/ML IV SOLN: 4.0000 mg | INTRAVENOUS | Status: DC | PRN

## 2021-04-19 NOTE — Assessment & Plan Note (Addendum)
Potassium has normalized.  This is secondary to acute kidney injury as well as severe metabolic acidosis. ?

## 2021-04-19 NOTE — Assessment & Plan Note (Addendum)
--  increase Toprol to 150 mg daily  ?--d/c cardizem due to hx of sCHF ?

## 2021-04-19 NOTE — Assessment & Plan Note (Deleted)
-   Poor urine output in the last few days ?- Multifactorial including severe sepsis and complicated by prerenal secondary to GI loss ?- Nephrology has been consulted by EDP ?- Nephrology recommended starting patient on bicarb drip, we will continue this ?- BMP in a.m. ?

## 2021-04-19 NOTE — Assessment & Plan Note (Addendum)
Mild elevation of procalcitonin level in the setting of acute renal failure.  Completed Rocephin and Zithromax for total 5 day abx ?

## 2021-04-19 NOTE — ED Provider Notes (Signed)
? ?[facility] ?Provider Note ? ? ? Event Date/Time  ? First MD Initiated Contact with Patient 04/19/21 2048   ?  (approximate) ? ? ?History  ? ?Chief Complaint ?Emesis and Acute Renal Failure ? ? ?HPI ? ?Blake West is a 71 y.o. male with past medical history of hypertension, hyperlipidemia, diabetes, CHF, and gout who presents to the ED complaining of abnormal labs.  Patient reports that he has been dealing with a productive cough for almost 2 weeks now, subsequently developed nausea, vomiting, and diarrhea over the past 4 days.  He has not had difficulty keeping down both liquids and solids for the past 4 days, reports feeling generally weak with subjective fevers.  He complains of diffuse abdominal pain but denies any chest pain, states he has been making urine without any dysuria.  He was seen at his PCPs office earlier today and found to have BUN of greater than 200 with creatinine of 14 and elevated potassium.  He was referred to the ED for further evaluation and admission. ?  ? ? ?Physical Exam  ? ?Triage Vital Signs: ?ED Triage Vitals  ?Enc Vitals Group  ?   BP 04/19/21 1814 (!) 146/106  ?   Pulse Rate 04/19/21 1814 (!) 119  ?   Resp 04/19/21 1814 20  ?   Temp 04/19/21 1814 99 ?F (37.2 ?C)  ?   Temp Source 04/19/21 1814 Oral  ?   SpO2 04/19/21 1814 94 %  ?   Weight 04/19/21 1816 183 lb (83 kg)  ?   Height 04/19/21 1816 5\' 11"  (1.803 m)  ?   Head Circumference --   ?   Peak Flow --   ?   Pain Score 04/19/21 1816 0  ?   Pain Loc --   ?   Pain Edu? --   ?   Excl. in [place]? --   ? ? ?Most recent vital signs: ?Vitals:  ? 04/19/21 2100 04/19/21 2304  ?BP: 125/74 124/79  ?Pulse: (!) 116 (!) 123  ?Resp: 20 18  ?Temp:  97.9 ?F (36.6 ?C)  ?SpO2: 93% 97%  ? ? ?Constitutional: Alert and oriented. ?Eyes: Conjunctivae are normal. ?Head: Atraumatic. ?Nose: No congestion/rhinnorhea. ?Mouth/Throat: Mucous membranes are dry.  ?Cardiovascular: Tachycardic, irregularly irregular rhythm. Grossly normal  heart sounds.  2+ radial pulses bilaterally. ?Respiratory: Normal respiratory effort.  No retractions. Lungs CTAB. ?Gastrointestinal: Soft and diffusely tender to palpation with no rebound or guarding. No distention. ?Musculoskeletal: No lower extremity tenderness nor edema.  ?Neurologic:  Normal speech and language. No gross focal neurologic deficits are appreciated. ? ? ? ?ED Results / Procedures / Treatments  ? ?Labs ?(all labs ordered are listed, but only abnormal results are displayed) ?Labs Reviewed  ?CBC WITH DIFFERENTIAL/PLATELET - Abnormal; Notable for the following components:  ?    Result Value  ? WBC 15.7 (*)   ? Neutro Abs 14.3 (*)   ? Lymphs Abs 0.6 (*)   ? Abs Immature Granulocytes 0.09 (*)   ? All other components within normal limits  ?COMPREHENSIVE METABOLIC PANEL - Abnormal; Notable for the following components:  ? Potassium 6.8 (*)   ? CO2 8 (*)   ? Glucose, Bld 116 (*)   ? BUN 247 (*)   ? Creatinine, Ser 15.05 (*)   ? AST 11 (*)   ? GFR, Estimated 3 (*)   ? Anion gap 24 (*)   ? All other components within normal limits  ?MAGNESIUM -  Abnormal; Notable for the following components:  ? Magnesium 3.2 (*)   ? All other components within normal limits  ?LIPASE, BLOOD - Abnormal; Notable for the following components:  ? Lipase 88 (*)   ? All other components within normal limits  ?RESP PANEL BY RT-PCR (FLU A&B, COVID) [id number]  ?CULTURE, BLOOD (ROUTINE X 2)  ?CULTURE, BLOOD (ROUTINE X 2)  ?MRSA NEXT GEN BY PCR, NASAL  ?C DIFFICILE QUICK SCREEN W PCR REFLEX    ?GASTROINTESTINAL PANEL BY PCR, STOOL (REPLACES STOOL CULTURE)  ?URINE CULTURE  ?PROCALCITONIN  ?LACTIC ACID, PLASMA  ?LACTIC ACID, PLASMA  ?URINALYSIS, ROUTINE W REFLEX MICROSCOPIC  ?PROCALCITONIN  ?BASIC METABOLIC PANEL  ?MAGNESIUM  ?PHOSPHORUS  ?CBC WITH DIFFERENTIAL/PLATELET  ?PROTIME-INR  ?CORTISOL-AM, BLOOD  ?HEMOGLOBIN A1C  ? ? ? ?EKG ? ?ED ECG REPORT ?Tempie Hoist, the attending physician, personally viewed and interpreted this ECG. ? ?  Date: 04/19/2021 ? EKG Time: 20:47 ? Rate: 113 ? Rhythm: atrial fibrillation ? Axis: Normal ? Intervals:nonspecific intraventricular conduction delay ? ST&T Change: Inferolateral T wave inversions ? ?RADIOLOGY ?Chest x-ray reviewed by me with no infiltrate, edema, or effusion.  CT of abdomen/pelvis reviewed by me with no inflammatory changes or fluid collections in the abdomen, evidence of pneumonia noted in bilateral lower lobes. ? ?PROCEDURES: ? ?Critical Care performed: Yes, see critical care procedure note(s) ? ?.Critical Care ?Performed by: Blake Divine, MD ?Authorized by: Blake Divine, MD  ? ?Critical care provider statement:  ?  Critical care time (minutes):  45 ?  Critical care time was exclusive of:  Separately billable procedures and treating other patients and teaching time ?  Critical care was necessary to treat or prevent imminent or life-threatening deterioration of the following conditions:  Renal failure ?  Critical care was time spent personally by me on the following activities:  Development of treatment plan with patient or surrogate, discussions with consultants, evaluation of patient's response to treatment, examination of patient, ordering and review of laboratory studies, ordering and review of radiographic studies, ordering and performing treatments and interventions, pulse oximetry, re-evaluation of patient's condition and review of old charts ?  I assumed direction of critical care for this patient from another provider in my specialty: no   ?  Care discussed with: admitting provider   ?.1-3 Lead EKG Interpretation ?Performed by: Blake Divine, MD ?Authorized by: Blake Divine, MD  ? ?  Interpretation: abnormal   ?  ECG rate:  115-125 ?  ECG rate assessment: tachycardic   ?  Rhythm: atrial fibrillation   ?  Ectopy: none   ?  Conduction: normal   ? ? ?MEDICATIONS ORDERED IN ED: ?Medications  ?azithromycin (ZITHROMAX) 500 mg in sodium chloride 0.9 % 250 mL IVPB (500 mg Intravenous  New Bag/Given 04/19/21 2301)  ?sodium bicarbonate 150 mEq in dextrose 5 % 1,150 mL infusion ( Intravenous New Bag/Given 04/19/21 2311)  ?levothyroxine (SYNTHROID) tablet 25 mcg (has no administration in time range)  ?acetaminophen (TYLENOL) tablet 650 mg (has no administration in time range)  ?  Or  ?acetaminophen (TYLENOL) suppository 650 mg (has no administration in time range)  ?ondansetron [facility]) tablet 4 mg ( Oral See Alternative 04/19/21 2257)  ?  Or  ?ondansetron [facility]) injection 4 mg (4 mg Intravenous Given 04/19/21 2257)  ?heparin injection 5,000 Units (has no administration in time range)  ?metroNIDAZOLE (FLAGYL) IVPB 500 mg (has no administration in time range)  ?vancomycin (VANCOREADY) IVPB 2000 mg/400 mL (has no administration in time range)  ?  ceFEPIme (MAXIPIME) 1 g in sodium chloride 0.9 % 100 mL IVPB (has no administration in time range)  ?vancomycin variable dose per unstable renal function (pharmacist dosing) (has no administration in time range)  ?morphine (PF) 4 MG/ML injection 4 mg (has no administration in time range)  ?albuterol (PROVENTIL) (2.5 MG/3ML) 0.083% nebulizer solution 2.5 mg (has no administration in time range)  ?budesonide (PULMICORT) nebulizer solution 0.5 mg (has no administration in time range)  ?insulin aspart (novoLOG) injection 0-9 Units (has no administration in time range)  ?insulin aspart (novoLOG) injection 0-5 Units (has no administration in time range)  ?metoprolol tartrate (LOPRESSOR) injection 5 mg (has no administration in time range)  ?sodium chloride 0.9 % bolus 1,000 mL (1,000 mLs Intravenous New Bag/Given 04/19/21 2230)  ?ondansetron [facility]) injection 4 mg (4 mg Intravenous Given 04/19/21 2226)  ?sodium chloride 0.9 % bolus 1,000 mL (1,000 mLs Intravenous New Bag/Given 04/19/21 2225)  ?albuterol (PROVENTIL) (2.5 MG/3ML) 0.083% nebulizer solution 10 mg (10 mg Nebulization Given 04/19/21 2305)  ?insulin aspart (novoLOG) injection 10 Units (10 Units Intravenous Given  04/19/21 2241)  ?  And  ?dextrose 50 % solution 50 mL (50 mLs Intravenous Given 04/19/21 2239)  ?calcium gluconate inj 10% (1 g) URGENT USE ONLY! (1 g Intravenous Given 04/19/21 2235)  ?sodium zirconium cycl

## 2021-04-19 NOTE — ED Triage Notes (Signed)
Sent to ED by PCP.  Had bloodwork drawn today, BUN:  200  CR:  14.  Here to be admitted for ARF. ? ?AAOx3.  Skin warm and dry. NAD ?

## 2021-04-19 NOTE — ED Notes (Signed)
Pt remains in CT at this time.  

## 2021-04-19 NOTE — Assessment & Plan Note (Addendum)
Patient met criteria for severe sepsis on admission with tachycardia, tachypnea, leukocytosis and a severe renal failure and pneumonia source.  Status post IV antibiotics and fluids.  Sepsis itself has resolved. ?

## 2021-04-19 NOTE — Assessment & Plan Note (Addendum)
--  increase Toprol to 150 mg daily.  Previous Cardizem discontinued. ?Started on Eliquis. ?

## 2021-04-19 NOTE — Assessment & Plan Note (Addendum)
Patient has been having diarrhea for 1 week, but none since presentation, so C diff and GI path could not be collected. ?

## 2021-04-19 NOTE — ED Triage Notes (Signed)
Pt sent from PCP for ARF, states he has been sick with cough congestion for the past couple of weeks and in the past 4 days having N/V/D with increased weakness. Pt has results from today through [facility] mychart ?

## 2021-04-19 NOTE — ED Notes (Signed)
Pt taken back to CT at this time.  

## 2021-04-19 NOTE — Hospital Course (Addendum)
71 year old male with past medical history of chronic systolic heart failure, stage IV chronic kidney disease, history of glomerulonephritis who presented to the emergency room on 3/27 after being sent over from his nephrologist with lab work noting hyperkalemia and worsening renal function.  Patient found to have a creatinine of 15.  Chest x-ray also noted scattered nodular airspace disease concerning for pneumonia.  Patient admitted to the hospital service and started on antibiotics.  Nephrology consulted and work-up underway for vasculitis.  Renal function continued to decline and patient had dialysis catheter placed and started dialysis on 3/30.  Hospital course complicated by urinary retention which has since resolved and treatment of multifocal pneumonia. ? ? ?

## 2021-04-19 NOTE — Assessment & Plan Note (Deleted)
-   Appears clinically dry at this time, treat per sepsis with fluid ?

## 2021-04-19 NOTE — Assessment & Plan Note (Addendum)
Not currently treated. ?

## 2021-04-19 NOTE — ED Notes (Signed)
Pt to CT via stretcher at this time

## 2021-04-19 NOTE — Assessment & Plan Note (Addendum)
Continue Synthroid °

## 2021-04-19 NOTE — Progress Notes (Signed)
Pharmacy Antibiotic Note ? ?Blake West is a 71 y.o. male admitted on 04/19/2021 with sepsis.  Pharmacy has been consulted for Vancomycin, Cefepime dosing.  Pt is AKI, baseline SrCr is ~ 0.8 but presents with SrCr of 15.  Will dose by levels until renal function improves  ? ?Plan: ?Cefepime 1 gm IV Q24H ordered to start on 3/27 @ ~ 2300.  ? ?Vancomycin 2 gm IV X 1 ordered for 3/28 @ ~ 0000. ?Will draw random vanc level on 3/29 @ 0000.  ? ? ? ?Height: 5\' 11"  (180.3 cm) ?Weight: 83 kg (183 lb) ?IBW/kg (Calculated) : 75.3 ? ?Temp (24hrs), Avg:98.5 ?F (36.9 ?C), Min:97.9 ?F (36.6 ?C), Max:99 ?F (37.2 ?C) ? ?Recent Labs  ?Lab 04/19/21 ?2110 04/19/21 ?2145  ?WBC 15.7*  --   ?CREATININE 15.05*  --   ?LATICACIDVEN  --  1.2  ?  ?Estimated Creatinine Clearance: 4.8 mL/min (A) (by C-G formula based on SCr of 15.05 mg/dL (H)).   ? ?Allergies  ?Allergen Reactions  ? Codeine   ?  Joints hurt   ? ? ?Antimicrobials this admission: ?  >>  ?  >>  ? ?Dose adjustments this admission: ? ? ?Microbiology results: ? BCx:  ? UCx:   ? Sputum:   ? MRSA PCR:  ? ?Thank you for allowing pharmacy to be a part of this patient?s care. ? ?Laraine Samet D ?04/19/2021 11:14 PM ? ?

## 2021-04-19 NOTE — H&P (Addendum)
?History and Physical  ? ?Blake West IBB:[id number] DOB: [birth year] DOA: 04/19/2021 ? ?PCP: Leonel Ramsay, MD  ?Outpatient Specialists: Dr. Murlean Iba, nephrology ?Patient coming from: Outpatient PCP ? ?I have personally briefly reviewed patient's old medical records in [place]. ? ?Chief Concern: Renal failure ? ?HPI: [name] is a 71 year old male with history of biventricular heart failure, hypertension, gout, chronic heart failure reduced ejection fraction, history of glomerulonephritis, CKD stage IV, who presents emergency department from outpatient clinic for chief concerns of renal failure. ? ?Per report he has been having nausea, vomiting, poor p.o. intake, and diarrhea for 4 days. ? ?Initial vitals in the emergency department showed temperature of 99, respiration rate of 20, heart rate 119, blood pressure 146/106, SPO2 of 94% on room air. ? ?Serum sodium 135, potassium 6.8, chloride 103, bicarb 8, BUN of 247, serum creatinine of 15.05, GFR of 3, nonfasting blood glucose 116, WBC 15.7, hemoglobin 13.3, platelets of 267. ? ?Procalcitonin was elevated at 0.83.  COVID/influenza A/influenza B PCR were negative. ? ?CTA of the pelvis without contrast done in the ED showed scattered nodular groundglass airspace disease.  Consistent with multifocal bronchopneumonia.  Atypical infection could be considered given the pattern of airspace disease.  Distal colonic diverticulosis without diverticulitis.  Enlarged prostate. ? ?ED treatment: Azithromycin 500 mg IV, cefepime 2 g IV, vancomycin, sodium chloride 2 L bolus.,  Lokelma, insulin and dextrose combination. ? ?At bedside patient appears calm and acutely ill.  He was able to tell me his name, his age, the current location, and his spouse and daughter at bedside. ? ?He states that he has been having diarrhea for about 2 weeks, watery brown bowel movements, too many to count per day.  He denies recent antibiotic use. ? ?In the last 2 days he  developed nausea and vomiting.  He has vomited about 3-4 times for the last 2 days.  He states his vomitus is foamy white.  He endorses poor p.o. intake. ? ?Multiple sick contacts including his sister who recently was diagnosed with COVID and multiple grandniece and nieces who had respiratory infection, fever, cough, congestion, nausea, vomiting, diarrhea. ? ?He also further endorses that he has been coughing and it is productive of yellow sputum in the last week.  He denies any known fever at home. ? ?Social history: He is at home with his wife.  He is a former tobacco user quitting in 1952.  He denies EtOH and recreational drug use.  He is currently retired and formerly worked as an Chief Financial Officer. ? ?Vaccination history: He is not vaccinated for influenza and he is not vaccinated for COVID-19. ? ?ROS: ?Constitutional: no weight change, no fever ?ENT/Mouth: no sore throat, no rhinorrhea ?Eyes: no eye pain, no vision changes ?Cardiovascular: no chest pain, no dyspnea,  no edema, no palpitations ?Respiratory: + cough, + sputum, no wheezing ?Gastrointestinal: + nausea, + vomiting, + diarrhea, no constipation ?Genitourinary: no urinary incontinence, no dysuria, no hematuria ?Musculoskeletal: no arthralgias, no myalgias ?Skin: no skin lesions, no pruritus, ?Neuro: + weakness, no loss of consciousness, no syncope ?Psych: no anxiety, no depression, + decrease appetite ?Heme/Lymph: no bruising, no bleeding ? ?ED Course: [place] with emergency medicine provider, patient requiring hospitalization for chief concerns of severe sepsis. ? ?Assessment/Plan ? ?Principal Problem: ?  Severe sepsis with acute organ dysfunction ([place]) ?Active Problems: ?  Acute congestive heart failure ([place]) ?  Essential hypertension ?  Hyperlipidemia ?  Hypothyroidism ?  Diarrhea ?  Acute renal failure ([place]) ?  Hyperkalemia ?  Multifocal pneumonia ?  Chronic atrial fibrillation with RVR (H[facility]) ?  ?Assessment and Plan: ? ?* Severe sepsis with acute  organ dysfunction ([place]) ?Multiorgan failure ?- Increased heart rate, severe leukocytosis of 15.7, renal failure, A-fib with RVR ?- Blood cultures x2 ordered, 1 is in process ?- UA and urine culture needs to be collected ?- Check C. difficile and GI panel ? ?Chronic atrial fibrillation with RVR (HCC) ?- Metoprolol 5 mg every 2 hours as needed for heart rate greater than 120, 4 doses ordered ? ?Multifocal pneumonia ?- Continue broad-spectrum antibiotic, Cefepime, aztreonam, vancomycin per pharmacy ?- Blood cultures ? ?Hyperkalemia ?- Insulin 10 units, dextrose 50 and Lokelma ?- Insulin SSI with at bedtime coverage ordered ? ?Acute renal failure ([place]) ?- Poor urine output in the last few days ?- Multifactorial including severe sepsis and complicated by prerenal secondary to GI loss ?- Nephrology has been consulted by EDP ?- Nephrology recommended starting patient on bicarb drip, we will continue this ?- BMP in a.m. ? ?Diarrhea ?- For the last 2 weeks with renal failure ?- Check C. difficile, GI panel ? ?Hypothyroidism ?- Resumed home levothyroxine 25 mcg daily ? ?Hyperlipidemia ?- Per med reconciliation patient currently not on statin medication or anti hyperlipidemia agent ? ?Essential hypertension ?- Per med reconciliation patient currently not on any antihypertensive medications ? ?Acute congestive heart failure ([place]) ?- Appears clinically dry at this time, treat per sepsis with fluid ? ?Chart reviewed.  ? ?DVT prophylaxis: Heparin ?Code Status: Full code ?Diet: Renal/carb modified ?Family Communication: Data spouse and daughter at bedside ?Disposition Plan: Pending clinical course ?Consults called: None at this time ?Admission status: Inpatient, progressive cardiac ? ?Past Medical History:  ?Diagnosis Date  ? Abnormal EKG   ? Biventricular failure ([place])   ? CAD (coronary artery disease)   ? Colorectal polyps   ? Gout   ? Hyperlipidemia LDL goal <70   ? Hypertension   ? Hypothyroidism   ? Kidney stone   ? Pneumonia    ? Testicular hypofunction   ? Type 2 diabetes mellitus ([place]) 04/26/2019  ? ?Past Surgical History:  ?Procedure Laterality Date  ? CARDIAC CATHETERIZATION    ? FOOT SURGERY    ? RIGHT/LEFT HEART CATH AND CORONARY ANGIOGRAPHY N/A 04/30/2019  ? Procedure: RIGHT/LEFT HEART CATH AND CORONARY ANGIOGRAPHY;  Surgeon: Troy Sine, MD;  Location: [facility];  Service: Cardiovascular;  Laterality: N/A;  ? ?Social History:  reports that he quit smoking about 19 years ago. His smoking use included cigarettes. He has a 30.00 pack-year smoking history. He has never used smokeless tobacco. He reports that he does not drink alcohol and does not use drugs. ? ?Allergies  ?Allergen Reactions  ? Codeine   ?  Joints hurt   ? ?Family History  ?Problem Relation Age of Onset  ? Heart attack Mother 74  ? Pulmonary fibrosis Father   ? Congestive Heart Failure Brother   ? Heart attack Brother   ? Heart attack Brother   ? Early death Sister   ? Kidney disease Paternal Grandfather   ? Diabetes Sister   ? ?Family history: Family history reviewed and not pertinent ? ?Prior to Admission medications   ?Medication Sig Start Date End Date Taking? Authorizing Provider  ?levofloxacin (LEVAQUIN) 750 MG tablet Take 750 mg by mouth daily. 04/19/21 04/25/21 Yes [provider]  ?levothyroxine (SYNTHROID) 25 MCG tablet Take 25 mcg by mouth every  morning. 01/14/21  Yes [provider]  ?ondansetron (ZOFRAN) 4 MG tablet Take 4 mg by mouth every 8 (eight) hours as needed. 04/19/21 04/26/21 Yes [provider]  ? ?Physical Exam: ?Vitals:  ? 04/19/21 2100 04/19/21 2304 04/20/21 0051 04/20/21 0100  ?BP: 125/74 124/79 113/84   ?Pulse: (!) 116 (!) 123 (!) 114   ?Resp: 20 18 18    ?Temp:  97.9 ?F (36.6 ?C) 97.7 ?F (36.5 ?C)   ?TempSrc:  Oral Oral   ?SpO2: 93% 97% 99%   ?Weight:    86 kg  ?Height:    5\' 11"  (1.803 m)  ? ?Constitutional: appears age-appropriate, NAD, calm, comfortable ?Eyes: PERRL, lids and conjunctivae normal ?ENMT: Mucous  membranes are moist. Posterior pharynx clear of any exudate or lesions. Age-appropriate dentition. Hearing appropriate ?Neck: normal, supple, no masses, no thyromegaly ?Respiratory: clear to auscultati

## 2021-04-20 DIAGNOSIS — E8729 Other acidosis: Secondary | ICD-10-CM

## 2021-04-20 DIAGNOSIS — A419 Sepsis, unspecified organism: Secondary | ICD-10-CM | POA: Diagnosis not present

## 2021-04-20 DIAGNOSIS — N170 Acute kidney failure with tubular necrosis: Secondary | ICD-10-CM | POA: Diagnosis not present

## 2021-04-20 DIAGNOSIS — I482 Chronic atrial fibrillation, unspecified: Secondary | ICD-10-CM

## 2021-04-20 DIAGNOSIS — I5022 Chronic systolic (congestive) heart failure: Secondary | ICD-10-CM | POA: Diagnosis not present

## 2021-04-20 DIAGNOSIS — N184 Chronic kidney disease, stage 4 (severe): Secondary | ICD-10-CM

## 2021-04-20 HISTORY — DX: Other acidosis: E87.29

## 2021-04-20 LAB — CBC WITH DIFFERENTIAL/PLATELET
Abs Immature Granulocytes: 0.05 10*3/uL (ref 0.00–0.07)
Basophils Absolute: 0 10*3/uL (ref 0.0–0.1)
Basophils Relative: 0 %
Eosinophils Absolute: 1.2 10*3/uL — ABNORMAL HIGH (ref 0.0–0.5)
Eosinophils Relative: 8 %
HCT: 32 % — ABNORMAL LOW (ref 39.0–52.0)
Hemoglobin: 10.6 g/dL — ABNORMAL LOW (ref 13.0–17.0)
Immature Granulocytes: 0 %
Lymphocytes Relative: 7 %
Lymphs Abs: 1.1 10*3/uL (ref 0.7–4.0)
MCH: 28.8 pg (ref 26.0–34.0)
MCHC: 33.1 g/dL (ref 30.0–36.0)
MCV: 87 fL (ref 80.0–100.0)
Monocytes Absolute: 0.7 10*3/uL (ref 0.1–1.0)
Monocytes Relative: 5 %
Neutro Abs: 11.4 10*3/uL — ABNORMAL HIGH (ref 1.7–7.7)
Neutrophils Relative %: 80 %
Platelets: 221 10*3/uL (ref 150–400)
RBC: 3.68 MIL/uL — ABNORMAL LOW (ref 4.22–5.81)
RDW: 14.6 % (ref 11.5–15.5)
WBC: 14.4 10*3/uL — ABNORMAL HIGH (ref 4.0–10.5)
nRBC: 0 % (ref 0.0–0.2)

## 2021-04-20 LAB — URINALYSIS, ROUTINE W REFLEX MICROSCOPIC
Bilirubin Urine: NEGATIVE
Glucose, UA: NEGATIVE mg/dL
Ketones, ur: NEGATIVE mg/dL
Nitrite: NEGATIVE
Protein, ur: 100 mg/dL — AB
RBC / HPF: >50 RBC/hpf — ABNORMAL HIGH (ref 0–5)
Specific Gravity, Urine: 1.012 (ref 1.005–1.030)
Squamous Epithelial / HPF: NONE SEEN (ref 0–5)
pH: 5 (ref 5.0–8.0)

## 2021-04-20 LAB — GLUCOSE, CAPILLARY
Glucose-Capillary: 117 mg/dL — ABNORMAL HIGH (ref 70–99)
Glucose-Capillary: 115 mg/dL — ABNORMAL HIGH (ref 70–99)
Glucose-Capillary: 127 mg/dL — ABNORMAL HIGH (ref 70–99)
Glucose-Capillary: 116 mg/dL — ABNORMAL HIGH (ref 70–99)
Glucose-Capillary: 99 mg/dL (ref 70–99)

## 2021-04-20 LAB — BASIC METABOLIC PANEL
Anion gap: 24 — ABNORMAL HIGH (ref 5–15)
BUN: 195 mg/dL — ABNORMAL HIGH (ref 8–23)
CO2: 9 mmol/L — ABNORMAL LOW (ref 22–32)
Calcium: 8.7 mg/dL — ABNORMAL LOW (ref 8.9–10.3)
Chloride: 106 mmol/L (ref 98–111)
Creatinine, Ser: 14.69 mg/dL — ABNORMAL HIGH (ref 0.61–1.24)
GFR, Estimated: 3 mL/min — ABNORMAL LOW (ref >60)
Glucose, Bld: 119 mg/dL — ABNORMAL HIGH (ref 70–99)
Potassium: 5.1 mmol/L (ref 3.5–5.1)
Sodium: 139 mmol/L (ref 135–145)

## 2021-04-20 LAB — HEMOGLOBIN A1C
Hgb A1c MFr Bld: 5.9 % — ABNORMAL HIGH (ref 4.8–5.6)
Mean Plasma Glucose: 122.63 mg/dL

## 2021-04-20 LAB — MRSA NEXT GEN BY PCR, NASAL: MRSA by PCR Next Gen: NOT DETECTED

## 2021-04-20 LAB — PROCALCITONIN: Procalcitonin: 0.64 ng/mL

## 2021-04-20 LAB — PROTIME-INR
INR: 1.2 (ref 0.8–1.2)
Prothrombin Time: 15 seconds (ref 11.4–15.2)

## 2021-04-20 LAB — PHOSPHORUS: Phosphorus: 13.2 mg/dL — ABNORMAL HIGH (ref 2.5–4.6)

## 2021-04-20 LAB — CORTISOL-AM, BLOOD: Cortisol - AM: 29.9 ug/dL — ABNORMAL HIGH (ref 6.7–22.6)

## 2021-04-20 LAB — MAGNESIUM: Magnesium: 2.9 mg/dL — ABNORMAL HIGH (ref 1.7–2.4)

## 2021-04-20 MED ORDER — NEPRO/CARBSTEADY PO LIQD
237.0000 mL | Freq: Two times a day (BID) | ORAL | Status: DC
  Administered 2021-04-21 – 2021-04-23 (×2): 237 mL via ORAL

## 2021-04-20 MED ORDER — ADULT MULTIVITAMIN W/MINERALS CH
1.0000 | ORAL_TABLET | Freq: Every day | ORAL | Status: DC
  Administered 2021-04-20 – 2021-04-23 (×3): 1 via ORAL
  Filled 2021-04-20 (×2): qty 1

## 2021-04-20 MED ORDER — SODIUM CHLORIDE 0.9 % IV SOLN
500.0000 mg | INTRAVENOUS | Status: DC
  Administered 2021-04-20 – 2021-04-21 (×2): 500 mg via INTRAVENOUS
  Filled 2021-04-20: qty 5
  Filled 2021-04-20: qty 500

## 2021-04-20 MED ORDER — SODIUM CHLORIDE 0.9 % IV SOLN: 1.0000 g | INTRAVENOUS | Status: DC

## 2021-04-20 MED ORDER — GUAIFENESIN ER 600 MG PO TB12
600.0000 mg | ORAL_TABLET | Freq: Two times a day (BID) | ORAL | Status: DC
  Administered 2021-04-20 – 2021-04-30 (×20): 600 mg via ORAL
  Filled 2021-04-20 (×20): qty 1

## 2021-04-20 MED ORDER — METOPROLOL SUCCINATE ER 50 MG PO TB24
50.0000 mg | ORAL_TABLET | Freq: Every day | ORAL | Status: DC
  Administered 2021-04-20 – 2021-04-24 (×5): 50 mg via ORAL
  Filled 2021-04-20 (×5): qty 1

## 2021-04-20 MED ORDER — SODIUM CHLORIDE 0.9 % IV SOLN
2.0000 g | INTRAVENOUS | Status: AC
  Administered 2021-04-21 – 2021-04-24 (×4): 2 g via INTRAVENOUS
  Filled 2021-04-20 (×2): qty 20
  Filled 2021-04-20 (×2): qty 2

## 2021-04-20 NOTE — Assessment & Plan Note (Addendum)
--  Cr 15 on presentation, BUN 247. ?Patient had a chronic kidney disease stage IV, etiology still unclear.  Worsening renal function due to dehydration from diarrhea. ?Patient also has significant hyperkalemia and metabolic acidosis.   ?--there was also concern for vasculitis, and pt has been receiving IV solumedrol ?--dialysis cath placement and started dialysis on 3/30 ?Plan: ?--vasculitis workup per nephro ?--reduce steroid to prednisone 60 mg daily, per nephro ?--f/u kidney biopsy  ?--Plan for rituximab based on biopsy results ?--need to set up outpatient dialysis ?

## 2021-04-20 NOTE — Progress Notes (Signed)
?Blake West  ?ROUNDING NOTE  ? ?Subjective:  ? 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  Patient presents to the emergency department with complaints of nausea, vomiting, generalized decline.  Patient has been admitted for Hyperkalemia [E87.5] ?Atrial fibrillation with RVR ([place]) [I48.91] ?Severe sepsis with acute organ dysfunction ([place]) [A41.9, R65.20] ?Acute renal failure, unspecified acute renal failure type ([place]) [N17.9] ? ?Patient is known to our practice and was previously followed by Dr. Candiss Norse outpatient.  Last outpatient appointment October 2022.  Due to renal decline, proteinuria, hematuria with RBCs and possible RBC casts seen in UA during office visit, it was suggested patient undergo West biopsy for further evaluation.  Patient and wife hesitant to this treatment plan with wife stating at that time she felt it was too invasive. Chart review also shows wife contacted nephrology office stating patient continues to refuse West biopsy and steroids due to concern over elevated blood sugars.  They request management of current condition with over-the-counter medication.  Patient and spouse advised that this was not recommended. ? ?Patient currently seen resting in bed.  Wife and daughter at bedside.  Alert, appears agitated.  States he has had nausea and vomiting for 4 to 5 days, unable to tolerate meals.  Denies food distaste.  Wife states patient has been drowsy during daytime hours.  Patient reports decreased urination.  No lower extremity edema.  Denies chest pain or abdominal pain.  Denies shortness of breath.  Per wife, patient stopped taking NSAIDs in December after GI doctor suggested he quit.  When asked about his hesitation for renal biopsy, patient states he did not want to lose a part  of his body.  ? ?Labs on arrival include sodium 135, potassium 6.8, bicarb 8, BUN to 47, and creatinine 15 with GFR 3.  Baseline creatinine appears to be 3 with GFR 21 on 02/17/2021.  Elevated white blood cells 15.7.  Negative respiratory panel.  Chest x-ray negative for acute findings CT abdomen positive for multifocal bronchopneumonia and enlarged prostate.  Urine culture pending.  Urinalysis remains positive for microscopic hematuria and proteinuria with RBCs. ? ?We have been consulted to evaluate and manage acute West injury. ? ?Objective:  ?Vital signs in last 24 hours:  ?Temp:  [97.6 ?F (36.4 ?C)-99 ?F (37.2 ?C)] 97.9 ?F (36.6 ?C) (03/28 1138) ?Pulse Rate:  [98-123] 105 (03/28 1141) ?Resp:  [16-20] 18 (03/28 1138) ?BP: (87-146)/(59-106) 103/90 (03/28 1141) ?SpO2:  [93 %-99 %] 96 % (03/28 1141) ?Weight:  [83 kg-86 kg] 86 kg (03/28 0100) ? ?Weight change:  ?Filed Weights  ? 04/19/21 1816 04/20/21 0100  ?Weight: 83 kg 86 kg  ? ? ?Intake/Output: ?I/O last 3 completed shifts: ?In: 1423.4 [I.V.:332.4; IV Piggyback:1091] ?Out: 500 [Urine:500] ?  ?Intake/Output this shift: ? Total I/O ?In: 912.8 [I.V.:821.7; IV Piggyback:91.1] ?Out: 150 [Urine:150] ? ?Physical Exam: ?General: NAD, lying in bed  ?Head: Normocephalic, atraumatic.  Dry oral mucosal membranes  ?Eyes: Anicteric  ?Lungs:  Clear to auscultation, normal effort, room air  ?Heart: Regular rate and rhythm  ?Abdomen:  Soft, nontender, nondistended  ?Extremities: No peripheral edema.  ?Neurologic: Alert and oriented, moving all four extremities  ?Skin: No lesions  ?Access: None  ? ? ?Basic Metabolic Panel: ?Recent Labs  ?Lab 04/19/21 ?2110 04/20/21 ?2376  ?NA 135 139  ?K 6.8* 5.1  ?CL 103 106  ?  CO2 8* 9*  ?GLUCOSE 116* 119*  ?BUN 247* 195*  ?CREATININE 15.05* 14.69*  ?CALCIUM 9.6 8.7*  ?MG 3.2* 2.9*  ?PHOS  --  13.2*  ? ? ?Liver Function Tests: ?Recent Labs  ?Lab 04/19/21 ?2110  ?AST 11*  ?ALT 9  ?ALKPHOS 68  ?BILITOT 1.0  ?PROT 8.1  ?ALBUMIN 3.7  ? ?Recent Labs   ?Lab 04/19/21 ?2110  ?LIPASE 88*  ? ?No results for input(s): AMMONIA in the last 168 hours. ? ?CBC: ?Recent Labs  ?Lab 04/19/21 ?2110 04/20/21 ?4431  ?WBC 15.7* 14.4*  ?NEUTROABS 14.3* 11.4*  ?HGB 13.3 10.6*  ?HCT 40.5 32.0*  ?MCV 86.9 87.0  ?PLT 267 221  ? ? ?Cardiac Enzymes: ?No results for input(s): CKTOTAL, CKMB, CKMBINDEX, TROPONINI in the last 168 hours. ? ?BNP: ?Invalid input(s): POCBNP ? ?CBG: ?Recent Labs  ?Lab 04/20/21 ?0146 04/20/21 ?5400 04/20/21 ?1139  ?GLUCAP 117* 115* 127*  ? ? ?Microbiology: ?Results for orders placed or performed during the hospital encounter of 04/19/21  ?Resp Panel by RT-PCR (Flu A&B, Covid) Nasopharyngeal Swab     Status: None  ? Collection Time: 04/19/21  9:14 PM  ? Specimen: Nasopharyngeal Swab; Nasopharyngeal(NP) swabs in vial transport medium  ?Result Value Ref Range Status  ? SARS Coronavirus 2 by RT PCR NEGATIVE NEGATIVE Final  ?  Comment: (NOTE) ?SARS-CoV-2 target nucleic acids are NOT DETECTED. ? ?The SARS-CoV-2 RNA is generally detectable in upper respiratory ?specimens during the acute phase of infection. The lowest ?concentration of SARS-CoV-2 viral copies this assay can detect is ?138 copies/mL. A negative result does not preclude SARS-Cov-2 ?infection and should not be used as the sole basis for treatment or ?other patient management decisions. A negative result may occur with  ?improper specimen collection/handling, submission of specimen other ?than nasopharyngeal swab, presence of viral mutation(s) within the ?areas targeted by this assay, and inadequate number of viral ?copies(<138 copies/mL). A negative result must be combined with ?clinical observations, patient history, and epidemiological ?information. The expected result is Negative. ? ?Fact Sheet for Patients:  ?EntrepreneurPulse.com.au ? ?Fact Sheet for Healthcare Providers:  ?IncredibleEmployment.be ? ?This test is no t yet approved or cleared by the [place] FDA  and  ?has been authorized for detection and/or diagnosis of SARS-CoV-2 by ?FDA under an Emergency Use Authorization (EUA). This EUA will remain  ?in effect (meaning this test can be used) for the duration of the ?COVID-19 declaration under Section 564(b)(1) of the Act, 21 ?U.S.C.section 360bbb-3(b)(1), unless the authorization is terminated  ?or revoked sooner.  ? ? ?  ? Influenza A by PCR NEGATIVE NEGATIVE Final  ? Influenza B by PCR NEGATIVE NEGATIVE Final  ?  Comment: (NOTE) ?The Xpert Xpress SARS-CoV-2/FLU/RSV plus assay is intended as an aid ?in the diagnosis of influenza from Nasopharyngeal swab specimens and ?should not be used as a sole basis for treatment. Nasal washings and ?aspirates are unacceptable for Xpert Xpress SARS-CoV-2/FLU/RSV ?testing. ? ?Fact Sheet for Patients: ?EntrepreneurPulse.com.au ? ?Fact Sheet for Healthcare Providers: ?IncredibleEmployment.be ? ?This test is not yet approved or cleared by the [place] FDA and ?has been authorized for detection and/or diagnosis of SARS-CoV-2 by ?FDA under an Emergency Use Authorization (EUA). This EUA will remain ?in effect (meaning this test can be used) for the duration of the ?COVID-19 declaration under Section 564(b)(1) of the Act, 21 U.S.C. ?section 360bbb-3(b)(1), unless the authorization is terminated or ?revoked. ? ?Performed at [facility], [place], ?[place] [address] ?  ?Culture,  blood (routine x 2)     Status: None (Preliminary result)  ? Collection Time: 04/19/21  9:45 PM  ? Specimen: BLOOD  ?Result Value Ref Range Status  ? Specimen Description BLOOD RIGHT ANTECUBITAL  Final  ? Special Requests   Final  ?  BOTTLES DRAWN AEROBIC AND ANAEROBIC Blood Culture adequate volume  ? Culture   Final  ?  NO GROWTH < 12 HOURS ?Performed at [facility], [address] [place], [place] [address] ?  ? Report Status PENDING  Incomplete  ?Culture, blood (routine x 2)     Status:  None (Preliminary result)  ? Collection Time: 04/19/21 10:32 PM  ? Specimen: BLOOD  ?Result Value Ref Range Status  ? Specimen Description BLOOD LEFT ASSIST CONTROL  Final  ? Special Requests   Final

## 2021-04-20 NOTE — Assessment & Plan Note (Addendum)
This is secondary to acute on chronic renal failure.  ?--s/p Na bicarb gtt ? ?

## 2021-04-20 NOTE — Progress Notes (Signed)
Initial Nutrition Assessment ? ?DOCUMENTATION CODES:  ? ?Non-severe (moderate) malnutrition in context of chronic illness ? ?INTERVENTION:  ?- Liberalize diet from a renal/carb modified diet to a 2gm sodium diet to provide widest variety of menu options to enhance nutritional adequacy; given inadequate PO intake for the last month  ? ?- Nepro Shake po BID, each supplement provides 425 kcal and 19 grams protein ? ?- MVI with minerals daily  ? ?- Referral to outpatient nutrition education center ? ?NUTRITION DIAGNOSIS:  ? ?Moderate Malnutrition related to chronic illness (CHF, CKD IV) as evidenced by mild fat depletion, moderate muscle depletion, energy intake < or equal to 75% for > or equal to 1 month. ? ?GOAL:  ? ?Patient will meet greater than or equal to 90% of their needs ? ?MONITOR:  ? ?PO intake, Supplement acceptance, Diet advancement, Labs, Weight trends, I & O's ? ?REASON FOR ASSESSMENT:  ? ?Malnutrition Screening Tool ?  ? ?ASSESSMENT:  ? ?Pt admitted from outpatient PCP office with nausea, vomiting, poor PO intake and diarrhea x4 days secondary to renal failure. PMH significant for biventricular heart failure, HTN, gout, CHF, glomerulonephritis and CKD stage IV. ? ?Pt resting during visit. His wife and daughter present at bedside, provided history on pt. His wife states that 1 month ago they began intermittent fasting and eating keto to help manage his kidney disease. He had been eating 2 meals daily consisting of sauteed vegetables, chicken, mozzarella cheese, keto bread/wraps. They have cut back on salt as pt has become sensitive to the flavor. 2 weeks ago he began eating less as his appetite started to decreased, only taking bites of food. 4 days ago he stopped being able to tolerate drinks and food and was taking NPO. Since admission, he has begun to take bites of food, eating about half of each item on his tray.  ? ?About 1 month ago, they report his weight was around 250 lbs and endorses weight loss  given switching diet and poor PO intake with a current weight of 160 lbs. Reviewed weight history and there is limited documentation of recent weights. Unfortunately unable to confirm weight loss within this time frame. However pt has weight loss within th past 2 years. Current weight noted to be 190 lbs. Will continue to assess throughout admission.  ? ?Given pt with a moderate degree of malnutrition and inadequate PO intake, he would benefit from a liberalized diet to provide more menu options to optimize nutritional intake.  ? ?Medications: SSI, synthroid, IV abx, sodium bicarbonate in D5 ? ?Labs: sodium 139 (WNL), potassium 5.1 (WNL), BUN 195, Cr 14.69, calcium 8.7, phos 13.2 (H), Mg 2.9(H), HGBA1c 5.9%,  ? CBG's 115-127x 24 hours ? ?UOP: 52ml x12 hours ?I/O's: +1.7L since admission ? ?NUTRITION - FOCUSED PHYSICAL EXAM: ? ?Flowsheet Row Most Recent Value  ?Orbital Region Mild depletion  ?Upper Arm Region Severe depletion  ?Thoracic and Lumbar Region Mild depletion  ?Buccal Region Moderate depletion  ?Temple Region Moderate depletion  ?Clavicle Bone Region Mild depletion  ?Clavicle and Acromion Bone Region Moderate depletion  ?Scapular Bone Region Moderate depletion  ?Dorsal Hand Moderate depletion  ?Patellar Region Moderate depletion  ?Anterior Thigh Region Moderate depletion  ?Posterior Calf Region Mild depletion  ?Edema (RD Assessment) None  ?Hair Reviewed  ?Eyes Unable to assess  [resting]  ?Mouth Other (Comment)  [wears upper dentures]  ?Skin Reviewed  ?Nails Reviewed  ? ?  ? ?Diet Order:   ?Diet Order   ? ?       ?  Diet 2 gram sodium Room service appropriate? Yes; Fluid consistency: Thin; Fluid restriction: 1200 mL Fluid  Diet effective now       ?  ? ?  ?  ? ?  ? ? ?EDUCATION NEEDS:  ? ?Education needs have been addressed ? ?Skin:  Skin Assessment: Reviewed RN Assessment ? ?Last BM:  3/27 ? ?Height:  ? ?Ht Readings from Last 1 Encounters:  ?04/20/21 5\' 11"  (1.803 m)  ? ? ?Weight:  ? ?Wt Readings from  Last 1 Encounters:  ?04/20/21 86 kg  ? ?BMI:  Body mass index is 26.44 kg/m?. ? ?Estimated Nutritional Needs:  ? ?Kcal:  2200-2400 ? ?Protein:  110-125g ? ?Fluid:  >/=2.2L ? ?Clayborne Dana, RDN, LDN ?Clinical Nutrition ?

## 2021-04-20 NOTE — Progress Notes (Signed)
Pt up to floor from ED via stretcher. A&O x3, unaware of the year. No c/o pain. Daughter and wife at bedside to assist w/ admission. IVF/IV abx infusing per MAR. Oriented to room and use of call bell w/ verbalized understanding. Full assessment and vitals per flowsheets. Not OOB overnight. Comfort and safety maintained.  ? ?No urine output overnight despite multiple efforts. Bladderscan revealing 514 mL in bladder. Provider aware. Straight cath completed per order. UA sent.  ?

## 2021-04-20 NOTE — Assessment & Plan Note (Addendum)
Acute CHF ruled out.  Echocardiogram now notes ejection fraction of 40 to 45% with indeterminate diastolic function.  On Toprol. ?

## 2021-04-20 NOTE — Evaluation (Signed)
Occupational Therapy Evaluation ?Patient Details ?Name: Blake West ?MRN: [id number] ?DOB: [birth year] ?Today's Date: 04/20/2021 ? ? ?History of Present Illness 71 year old male with history of biventricular heart failure, hypertension, gout, chronic heart failure reduced ejection fraction, history of glomerulonephritis, CKD stage IV, who presents emergency department from outpatient clinic for chief concerns of renal failure.  Patient has been having coughing and shortness of breath for 2 weeks, nausea, vomiting and diarrhea for 1 week. Pt admitted for severe sepsis with acute organ dysfunction most likely due to pneumonia  ? ?Clinical Impression ?  ?Pt seen for OT evaluation this date. Upon arrival to room, pt awake and resting in bed with wife and daughter present. At baseline, pt is independent in all ADLs and mod-I with RW for functional mobility, living in a 2-story home with wife. Pt currently requires MIN A for bed mobility, MIN GUARD for seated LB dressing, MIN A for stand pivot transfer, and SET-UP assist for seated grooming tasks due to current functional impairments (See OT Problem List below).  ? ?Note: following supine>sit transfer, pt reported dizzness (BP 117/75). Attempted to take orthostatic vitals however pt was unable to stand long enough to obtain reading (pt attributing to R knee buckling). Once seated, BP 113/99. RN informed. Plan to obtain orthostatics at next session ? ?Pt would benefit from additional skilled OT services to maximize return to PLOF and minimize risk of future falls, injury, caregiver burden, and readmission. Upon discharge, recommend SNF as pt requires significantly more assistance for ADLs/functional mobility compared to baseline.   ? ?Recommendations for follow up therapy are one component of a multi-disciplinary discharge planning process, led by the attending physician.  Recommendations may be updated based on patient status, additional functional criteria and insurance  authorization.  ? ?Follow Up Recommendations ? Skilled nursing-short term rehab (<3 hours/day)  ?  ?Assistance Recommended at Discharge Frequent or constant Supervision/Assistance  ?Patient can return home with the following A lot of help with bathing/dressing/bathroom;A lot of help with walking and/or transfers ? ?  ?Functional Status Assessment ? Patient has had a recent decline in their functional status and demonstrates the ability to make significant improvements in function in a reasonable and predictable amount of time.  ?Equipment Recommendations ? Other (comment) (defer to next venue of care)  ?  ?   ?Precautions / Restrictions Precautions ?Precautions: Fall ?Precaution Comments: check orthostatics ?Restrictions ?Weight Bearing Restrictions: No  ? ?  ? ?Mobility Bed Mobility ?Overal bed mobility: Needs Assistance ?Bed Mobility: Supine to Sit ?  ?  ?Supine to sit: Min assist ?  ?  ?General bed mobility comments: Requires MIN A for trunk support ?  ? ?Transfers ?Overall transfer level: Needs assistance ?Equipment used: 1 person hand held assist ?Transfers: Sit to/from Stand ?Sit to Stand: Min assist ?  ?  ?  ?  ?  ?General transfer comment: Requires MIN A for steadying upon standing ?  ? ?  ?Balance Overall balance assessment: Needs assistance ?Sitting-balance support: No upper extremity supported, Feet supported ?Sitting balance-Leahy Scale: Fair ?Sitting balance - Comments: Requires MIN GUARD sitting EOB d/t pt reporting dizziness sitting upright ?  ?  ?Standing balance-Leahy Scale: Poor ?Standing balance comment: requires MIN A to maintain standing balance. Only able to maintain standing for ~10 sec before sitting ?  ?  ?  ?  ?  ?  ?  ?  ?  ?  ?  ?   ? ?ADL either performed or assessed with clinical  judgement  ? ?ADL Overall ADL's : Needs assistance/impaired ?  ?  ?Grooming: Wash/dry hands;Set up;Sitting ?  ?  ?  ?  ?  ?  ?  ?Lower Body Dressing: Min guard;Sitting/lateral leans ?Lower Body Dressing Details  (indicate cue type and reason): to don/doff socks while seated EOB. Requires MIN GUARD d/t pt reporting dizziness throughout session ?  ?  ?  ?  ?  ?  ?  ?   ? ? ? ?Vision Ability to See in Adequate Light: 0 Adequate ?Patient Visual Report: No change from baseline ?   ?   ?   ?   ? ?Pertinent Vitals/Pain Pain Assessment ?Pain Assessment: Faces ?Faces Pain Scale: Hurts a little bit ?Pain Location: R calf (to touch) ?Pain Descriptors / Indicators: Tender ?Pain Intervention(s): Limited activity within patient's tolerance, Monitored during session  ? ? ? ?   ?Extremity/Trunk Assessment Upper Extremity Assessment ?Upper Extremity Assessment: Generalized weakness ?  ?Lower Extremity Assessment ?Lower Extremity Assessment: Generalized weakness ?  ?  ?  ?Communication Communication ?Communication: No difficulties ?  ?Cognition Arousal/Alertness: Awake/alert ?Behavior During Therapy: [facility] for tasks assessed/performed ?Overall Cognitive Status: Within Functional Limits for tasks assessed ?  ?  ?  ?  ?  ?  ?  ?  ?  ?  ?  ?  ?  ?  ?  ?  ?General Comments: Requires increased processing time ?  ?  ?General Comments  following supine>sit pt reporting dizzness (BP 117/75). Attempted to take orthostatic vitals however pt unable to stand long enough to obtain reading (pt attributing to R knee buckling). Once seated, BP 113/99. RN informed. Plan to obtain orthostatics at next session ? ?  ?   ?   ? ? ?Home Living Family/patient expects to be discharged to:: Private residence ?Living Arrangements: Spouse/significant other ?Available Help at Discharge: Family;Available 24 hours/day ?Type of Home: House ?Home Access: Stairs to enter ?Entrance Stairs-Number of Steps: 3 ?Entrance Stairs-Rails: Right ?Home Layout: Two level;Able to live on main level with bedroom/bathroom ?Alternate Level Stairs-Number of Steps: Flight ?  ?Bathroom Shower/Tub: Tub/shower unit;Walk-in shower ?  ?  ?  ?  ?Home Equipment: Conservation officer, nature (2 wheels) ?  ?  ?  ? ?   ?Prior Functioning/Environment Prior Level of Function : Independent/Modified Independent ?  ?  ?  ?  ?  ?  ?Mobility Comments: Pt reports being MOD-I with functional mobility ("furniture surfs" in the house and uses RW for community mobility). Pt endorses multiple falls over past 6 months ?ADLs Comments: Pt reports being independent with ADLs. Pt enjoys tending to his chickens outside his house ?  ? ?  ?  ?OT Problem List: Decreased strength;Decreased activity tolerance;Impaired balance (sitting and/or standing);Cardiopulmonary status limiting activity ?  ?   ?OT Treatment/Interventions: Self-care/ADL training;Therapeutic exercise;Energy conservation;DME and/or AE instruction;Therapeutic activities;Patient/family education;Balance training  ?  ?OT Goals(Current goals can be found in the care plan section) Acute Rehab OT Goals ?Patient Stated Goal: to get stronger ?OT Goal Formulation: With patient/family ?Time For Goal Achievement: 05/04/21 ?Potential to Achieve Goals: Good ?ADL Goals ?Pt Will Perform Grooming: with min guard assist;standing ?Pt Will Perform Lower Body Dressing: with min guard assist;sit to/from stand ?Pt Will Transfer to Toilet: with min guard assist;ambulating;bedside commode  ?OT Frequency: Min 2X/week ?  ? ?   ?AM-PAC OT "6 Clicks" Daily Activity     ?Outcome Measure Help from another person eating meals?: None ?Help from another person taking care of  personal grooming?: A Little ?Help from another person toileting, which includes using toliet, bedpan, or urinal?: A Lot ?Help from another person bathing (including washing, rinsing, drying)?: A Lot ?Help from another person to put on and taking off regular upper body clothing?: A Little ?Help from another person to put on and taking off regular lower body clothing?: A Lot ?6 Click Score: 16 ?  ?End of Session Nurse Communication: Mobility status;Other (comment) (vitals) ? ?Activity Tolerance: Patient tolerated treatment well ?Patient left: in  chair;with call bell/phone within reach;with chair alarm set;with family/visitor present ? ?OT Visit Diagnosis: Unsteadiness on feet (R26.81);Muscle weakness (generalized) (M62.81)  ?              ?Time

## 2021-04-20 NOTE — H&P (View-Only) (Signed)
?[facility]  ?ROUNDING NOTE  ? ?Subjective:  ? 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  Patient presents to the emergency department with complaints of nausea, vomiting, generalized decline.  Patient has been admitted for Hyperkalemia [E87.5] ?Atrial fibrillation with RVR ([place]) [I48.91] ?Severe sepsis with acute organ dysfunction ([place]) [A41.9, R65.20] ?Acute renal failure, unspecified acute renal failure type ([place]) [N17.9] ? ?Patient is known to our practice and was previously followed by Dr. Candiss Norse outpatient.  Last outpatient appointment October 2022.  Due to renal decline, proteinuria, hematuria with RBCs and possible RBC casts seen in UA during office visit, it was suggested patient undergo kidney biopsy for further evaluation.  Patient and wife hesitant to this treatment plan with wife stating at that time she felt it was too invasive. Chart review also shows wife contacted nephrology office stating patient continues to refuse kidney biopsy and steroids due to concern over elevated blood sugars.  They request management of current condition with over-the-counter medication.  Patient and spouse advised that this was not recommended. ? ?Patient currently seen resting in bed.  Wife and daughter at bedside.  Alert, appears agitated.  States he has had nausea and vomiting for 4 to 5 days, unable to tolerate meals.  Denies food distaste.  Wife states patient has been drowsy during daytime hours.  Patient reports decreased urination.  No lower extremity edema.  Denies chest pain or abdominal pain.  Denies shortness of breath.  Per wife, patient stopped taking NSAIDs in December after GI doctor suggested he quit.  When asked about his hesitation for renal biopsy, patient states he did not want to lose a part  of his body.  ? ?Labs on arrival include sodium 135, potassium 6.8, bicarb 8, BUN to 47, and creatinine 15 with GFR 3.  Baseline creatinine appears to be 3 with GFR 21 on 02/17/2021.  Elevated white blood cells 15.7.  Negative respiratory panel.  Chest x-ray negative for acute findings CT abdomen positive for multifocal bronchopneumonia and enlarged prostate.  Urine culture pending.  Urinalysis remains positive for microscopic hematuria and proteinuria with RBCs. ? ?We have been consulted to evaluate and manage acute kidney injury. ? ?Objective:  ?Vital signs in last 24 hours:  ?Temp:  [97.6 ?F (36.4 ?C)-99 ?F (37.2 ?C)] 97.9 ?F (36.6 ?C) (03/28 1138) ?Pulse Rate:  [98-123] 105 (03/28 1141) ?Resp:  [16-20] 18 (03/28 1138) ?BP: (87-146)/(59-106) 103/90 (03/28 1141) ?SpO2:  [93 %-99 %] 96 % (03/28 1141) ?Weight:  [83 kg-86 kg] 86 kg (03/28 0100) ? ?Weight change:  ?Filed Weights  ? 04/19/21 1816 04/20/21 0100  ?Weight: 83 kg 86 kg  ? ? ?Intake/Output: ?I/O last 3 completed shifts: ?In: 1423.4 [I.V.:332.4; IV Piggyback:1091] ?Out: 500 [Urine:500] ?  ?Intake/Output this shift: ? Total I/O ?In: 912.8 [I.V.:821.7; IV Piggyback:91.1] ?Out: 150 [Urine:150] ? ?Physical Exam: ?General: NAD, lying in bed  ?Head: Normocephalic, atraumatic.  Dry oral mucosal membranes  ?Eyes: Anicteric  ?Lungs:  Clear to auscultation, normal effort, room air  ?Heart: Regular rate and rhythm  ?Abdomen:  Soft, nontender, nondistended  ?Extremities: No peripheral edema.  ?Neurologic: Alert and oriented, moving all four extremities  ?Skin: No lesions  ?Access: None  ? ? ?Basic Metabolic Panel: ?Recent Labs  ?Lab 04/19/21 ?2110 04/20/21 ?3846  ?NA 135 139  ?K 6.8* 5.1  ?CL 103 106  ?  CO2 8* 9*  ?GLUCOSE 116* 119*  ?BUN 247* 195*  ?CREATININE 15.05* 14.69*  ?CALCIUM 9.6 8.7*  ?MG 3.2* 2.9*  ?PHOS  --  13.2*  ? ? ?Liver Function Tests: ?Recent Labs  ?Lab 04/19/21 ?2110  ?AST 11*  ?ALT 9  ?ALKPHOS 68  ?BILITOT 1.0  ?PROT 8.1  ?ALBUMIN 3.7  ? ?Recent Labs   ?Lab 04/19/21 ?2110  ?LIPASE 88*  ? ?No results for input(s): AMMONIA in the last 168 hours. ? ?CBC: ?Recent Labs  ?Lab 04/19/21 ?2110 04/20/21 ?9767  ?WBC 15.7* 14.4*  ?NEUTROABS 14.3* 11.4*  ?HGB 13.3 10.6*  ?HCT 40.5 32.0*  ?MCV 86.9 87.0  ?PLT 267 221  ? ? ?Cardiac Enzymes: ?No results for input(s): CKTOTAL, CKMB, CKMBINDEX, TROPONINI in the last 168 hours. ? ?BNP: ?Invalid input(s): POCBNP ? ?CBG: ?Recent Labs  ?Lab 04/20/21 ?0146 04/20/21 ?3419 04/20/21 ?1139  ?GLUCAP 117* 115* 127*  ? ? ?Microbiology: ?Results for orders placed or performed during the hospital encounter of 04/19/21  ?Resp Panel by RT-PCR (Flu A&B, Covid) Nasopharyngeal Swab     Status: None  ? Collection Time: 04/19/21  9:14 PM  ? Specimen: Nasopharyngeal Swab; Nasopharyngeal(NP) swabs in vial transport medium  ?Result Value Ref Range Status  ? SARS Coronavirus 2 by RT PCR NEGATIVE NEGATIVE Final  ?  Comment: (NOTE) ?SARS-CoV-2 target nucleic acids are NOT DETECTED. ? ?The SARS-CoV-2 RNA is generally detectable in upper respiratory ?specimens during the acute phase of infection. The lowest ?concentration of SARS-CoV-2 viral copies this assay can detect is ?138 copies/mL. A negative result does not preclude SARS-Cov-2 ?infection and should not be used as the sole basis for treatment or ?other patient management decisions. A negative result may occur with  ?improper specimen collection/handling, submission of specimen other ?than nasopharyngeal swab, presence of viral mutation(s) within the ?areas targeted by this assay, and inadequate number of viral ?copies(<138 copies/mL). A negative result must be combined with ?clinical observations, patient history, and epidemiological ?information. The expected result is Negative. ? ?Fact Sheet for Patients:  ?EntrepreneurPulse.com.au ? ?Fact Sheet for Healthcare Providers:  ?IncredibleEmployment.be ? ?This test is no t yet approved or cleared by the [place] FDA  and  ?has been authorized for detection and/or diagnosis of SARS-CoV-2 by ?FDA under an Emergency Use Authorization (EUA). This EUA will remain  ?in effect (meaning this test can be used) for the duration of the ?COVID-19 declaration under Section 564(b)(1) of the Act, 21 ?U.S.C.section 360bbb-3(b)(1), unless the authorization is terminated  ?or revoked sooner.  ? ? ?  ? Influenza A by PCR NEGATIVE NEGATIVE Final  ? Influenza B by PCR NEGATIVE NEGATIVE Final  ?  Comment: (NOTE) ?The Xpert Xpress SARS-CoV-2/FLU/RSV plus assay is intended as an aid ?in the diagnosis of influenza from Nasopharyngeal swab specimens and ?should not be used as a sole basis for treatment. Nasal washings and ?aspirates are unacceptable for Xpert Xpress SARS-CoV-2/FLU/RSV ?testing. ? ?Fact Sheet for Patients: ?EntrepreneurPulse.com.au ? ?Fact Sheet for Healthcare Providers: ?IncredibleEmployment.be ? ?This test is not yet approved or cleared by the [place] FDA and ?has been authorized for detection and/or diagnosis of SARS-CoV-2 by ?FDA under an Emergency Use Authorization (EUA). This EUA will remain ?in effect (meaning this test can be used) for the duration of the ?COVID-19 declaration under Section 564(b)(1) of the Act, 21 U.S.C. ?section 360bbb-3(b)(1), unless the authorization is terminated or ?revoked. ? ?Performed at [facility], [place], ?[place] [address] ?  ?Culture,  blood (routine x 2)     Status: None (Preliminary result)  ? Collection Time: 04/19/21  9:45 PM  ? Specimen: BLOOD  ?Result Value Ref Range Status  ? Specimen Description BLOOD RIGHT ANTECUBITAL  Final  ? Special Requests   Final  ?  BOTTLES DRAWN AEROBIC AND ANAEROBIC Blood Culture adequate volume  ? Culture   Final  ?  NO GROWTH < 12 HOURS ?Performed at [facility], [address], [place], [place] [address] ?  ? Report Status PENDING  Incomplete  ?Culture, blood (routine x 2)     Status:  None (Preliminary result)  ? Collection Time: 04/19/21 10:32 PM  ? Specimen: BLOOD  ?Result Value Ref Range Status  ? Specimen Description BLOOD LEFT ASSIST CONTROL  Final  ? Special Requests   Final

## 2021-04-20 NOTE — Progress Notes (Signed)
?Progress Note ? ? ?Patient: Blake West:[id number] DOB: 1950/01/28 DOA: 04/19/2021     1 ?DOS: the patient was seen and examined on 04/20/2021 ?  ?Brief hospital course: ?[name] is a 71 year old male with history of biventricular heart failure, hypertension, gout, chronic heart failure reduced ejection fraction, history of glomerulonephritis, CKD stage IV, who presents emergency department from outpatient clinic for chief concerns of renal failure.  Patient has been followed by nephrology in the office, recommended work-up for vasculitis, but patient has not been interested per Dr. Candiss West. ?Patient has been having coughing and shortness of breath for 2 weeks, nausea, vomiting and diarrhea for 1 week. ?Upon arriving the hospital, patient had a serum creatinine of 15, bicarb 8, potassium 6.8.  Chest CT scan showed scattered nodular groundglass airspace disease. ?Patient is treated with bicarb drip, Zithromax, vancomycin and cefepime. ? ? ? ?Assessment and Plan: ?* Severe sepsis with acute organ dysfunction ([place]) ?Patient has severe sepsis with tachycardia, tachypnea, leukocytosis and a severe renal failure.  This is most likely due to pneumonia. ?He has received IV fluids.  Currently hemodynamically stable.  Blood culture negative less than 24 hours. ? ?High anion gap metabolic acidosis ?This is secondary to acute on chronic renal failure.  Patient still has significant metabolic acidosis, will continue bicarb drip. ? ?Chronic atrial fibrillation with RVR ([place]) ?Add a beta-blocker for better heart rate control.  Will consider anticoagulation after renal function improves. ? ?Multifocal pneumonia ?Mild elevation of procalcitonin level in the setting of acute renal failure.  Not consistent with typical bacterial pneumonia, MRSA culture negative, will discontinue vancomycin.  Change antibiotic to Rocephin and Zithromax. ? ?Hyperkalemia ?Potassium has normalized.  This is secondary to acute kidney injury as  well as severe metabolic acidosis. ? ?Acute renal failure superimposed on stage 4 chronic kidney disease ([place]) ?Patient had a chronic kidney disease stage IV, etiology still unclear.  Worsening renal function due to dehydration from diarrhea. ?Patient also has significant hyperkalemia and metabolic acidosis.  Continue bicarb drip.  Potassium level has normalized. ?Nephrology consult is obtained. ? ?Diarrhea ?Patient has been having diarrhea for 1 week, check C. difficile and GI viral panel. ? ?Chronic HFrEF (heart failure with reduced ejection fraction) ([place]) ?Acute CHF ruled out. ?Reviewed echocardiogram performed/2021, ejection fraction 20 to 25%. ?Patient was volume depleted at time of admission, no exacerbation of congestive heart failure.  ?Patient was not on evidence-based congestive heart failure treatment. ?At this point, I will start metoprolol succinate at 50 mg daily to help control the heart rate. ?Not able to start other treatment due to severe renal failure with hyperkalemia. ? ?Hypothyroidism ?Continue Synthroid. ? ?Hyperlipidemia ?Not currently treated. ? ?Essential hypertension ?Blood pressure stable, continue to follow-up ? ? ? ? ?  ? ?Subjective:  ?Patient no longer has any diarrhea since admission, no nausea vomiting.  Appetite is poor. ?Patient also had some short of breath which is baseline, no paroxysmal nocturnal dyspnea or orthopnea. ? ?Physical Exam: ?Vitals:  ? 04/20/21 0051 04/20/21 0100 04/20/21 0315 04/20/21 0723  ?BP: 113/84  (!) 123/59 104/71  ?Pulse: (!) 114  (!) 113 98  ?Resp: 18  18 16   ?Temp: 97.7 ?F (36.5 ?C)  97.7 ?F (36.5 ?C) 97.6 ?F (36.4 ?C)  ?TempSrc: Oral  Oral   ?SpO2: 99%  96% 97%  ?Weight:  86 kg    ?Height:  5\' 11"  (1.803 m)    ? ?General exam: Appears calm and comfortable  ?Respiratory system: Decreased  breathing sounds without any crackles. Respiratory effort normal. ?Cardiovascular system: Irregular, tachycardic no JVD, murmurs, rubs, gallops or clicks. No pedal  edema. ?Gastrointestinal system: Abdomen is nondistended, soft and nontender. No organomegaly or masses felt. Normal bowel sounds heard. ?Central nervous system: Alert and oriented x3. No focal neurological deficits. ?Extremities: Symmetric 5 x 5 power. ?Skin: No rashes, lesions or ulcers ?Psychiatry: Judgement and insight appear normal. Mood & affect appropriate.  ? ?Data Reviewed: ? ?Reviewed her prior echocardiogram results, ?CT abdomen/pelvis, chest x-ray. ?All lab results. ? ?Family Communication: Daughter updated at bedside. ? ?Disposition: ?Status is: Inpatient ?Remains inpatient appropriate because: Severity of disease, IV treatment. ? Planned Discharge Destination: Home with Home Health ? ? ? ?Time spent: 45 minutes ? ?Author: ?Sharen Hones, MD ?04/20/2021 10:41 AM ? ?For on call review [email or web address]  ?

## 2021-04-21 DIAGNOSIS — E440 Moderate protein-calorie malnutrition: Secondary | ICD-10-CM

## 2021-04-21 DIAGNOSIS — N170 Acute kidney failure with tubular necrosis: Secondary | ICD-10-CM | POA: Diagnosis not present

## 2021-04-21 DIAGNOSIS — N184 Chronic kidney disease, stage 4 (severe): Secondary | ICD-10-CM | POA: Diagnosis not present

## 2021-04-21 HISTORY — DX: Moderate protein-calorie malnutrition: E44.0

## 2021-04-21 LAB — CBC WITH DIFFERENTIAL/PLATELET
Abs Immature Granulocytes: 0.07 10*3/uL (ref 0.00–0.07)
Basophils Absolute: 0 10*3/uL (ref 0.0–0.1)
Basophils Relative: 0 %
Eosinophils Absolute: 1.1 10*3/uL — ABNORMAL HIGH (ref 0.0–0.5)
Eosinophils Relative: 8 %
HCT: 29.6 % — ABNORMAL LOW (ref 39.0–52.0)
Hemoglobin: 10.1 g/dL — ABNORMAL LOW (ref 13.0–17.0)
Immature Granulocytes: 1 %
Lymphocytes Relative: 7 %
Lymphs Abs: 0.9 10*3/uL (ref 0.7–4.0)
MCH: 28.8 pg (ref 26.0–34.0)
MCHC: 34.1 g/dL (ref 30.0–36.0)
MCV: 84.3 fL (ref 80.0–100.0)
Monocytes Absolute: 0.7 10*3/uL (ref 0.1–1.0)
Monocytes Relative: 5 %
Neutro Abs: 10.3 10*3/uL — ABNORMAL HIGH (ref 1.7–7.7)
Neutrophils Relative %: 79 %
Platelets: 205 10*3/uL (ref 150–400)
RBC: 3.51 MIL/uL — ABNORMAL LOW (ref 4.22–5.81)
RDW: 14.4 % (ref 11.5–15.5)
WBC: 13.1 10*3/uL — ABNORMAL HIGH (ref 4.0–10.5)
nRBC: 0 % (ref 0.0–0.2)

## 2021-04-21 LAB — PROTEIN / CREATININE RATIO, URINE
Creatinine, Urine: 52 mg/dL
Protein Creatinine Ratio: 2.85 mg/mg{Cre} — ABNORMAL HIGH (ref 0.00–0.15)
Total Protein, Urine: 148 mg/dL

## 2021-04-21 LAB — URINE CULTURE: Culture: NO GROWTH

## 2021-04-21 LAB — GLUCOSE, CAPILLARY
Glucose-Capillary: 104 mg/dL — ABNORMAL HIGH (ref 70–99)
Glucose-Capillary: 92 mg/dL (ref 70–99)
Glucose-Capillary: 107 mg/dL — ABNORMAL HIGH (ref 70–99)
Glucose-Capillary: 122 mg/dL — ABNORMAL HIGH (ref 70–99)

## 2021-04-21 LAB — COMPREHENSIVE METABOLIC PANEL
ALT: 10 U/L (ref 0–44)
AST: 12 U/L — ABNORMAL LOW (ref 15–41)
Albumin: 3 g/dL — ABNORMAL LOW (ref 3.5–5.0)
Alkaline Phosphatase: 48 U/L (ref 38–126)
Anion gap: 22 — ABNORMAL HIGH (ref 5–15)
BUN: 220 mg/dL — ABNORMAL HIGH (ref 8–23)
CO2: 14 mmol/L — ABNORMAL LOW (ref 22–32)
Calcium: 8.1 mg/dL — ABNORMAL LOW (ref 8.9–10.3)
Chloride: 103 mmol/L (ref 98–111)
Creatinine, Ser: 13.87 mg/dL — ABNORMAL HIGH (ref 0.61–1.24)
GFR, Estimated: 3 mL/min — ABNORMAL LOW (ref >60)
Glucose, Bld: 101 mg/dL — ABNORMAL HIGH (ref 70–99)
Potassium: 4.2 mmol/L (ref 3.5–5.1)
Sodium: 139 mmol/L (ref 135–145)
Total Bilirubin: 1.3 mg/dL — ABNORMAL HIGH (ref 0.3–1.2)
Total Protein: 6.2 g/dL — ABNORMAL LOW (ref 6.5–8.1)

## 2021-04-21 LAB — HEPATITIS B SURFACE ANTIGEN: Hepatitis B Surface Ag: NONREACTIVE

## 2021-04-21 LAB — PROCALCITONIN: Procalcitonin: 0.62 ng/mL

## 2021-04-21 MED ORDER — RISAQUAD PO CAPS
2.0000 | ORAL_CAPSULE | Freq: Three times a day (TID) | ORAL | Status: DC
  Administered 2021-04-21 – 2021-04-30 (×23): 2 via ORAL
  Filled 2021-04-21 (×24): qty 2

## 2021-04-21 MED ORDER — CHLORHEXIDINE GLUCONATE CLOTH 2 % EX PADS
6.0000 | MEDICATED_PAD | Freq: Every day | CUTANEOUS | Status: DC
  Administered 2021-04-21 – 2021-04-30 (×10): 6 via TOPICAL

## 2021-04-21 MED ORDER — SODIUM BICARBONATE 8.4 % IV SOLN
INTRAVENOUS | Status: DC
  Filled 2021-04-21 (×2): qty 150
  Filled 2021-04-21: qty 1000

## 2021-04-21 MED ORDER — METHYLPREDNISOLONE SODIUM SUCC 1000 MG IJ SOLR
1000.0000 mg | Freq: Every day | INTRAMUSCULAR | Status: AC
  Administered 2021-04-21 – 2021-04-23 (×3): 1000 mg via INTRAVENOUS
  Filled 2021-04-21 (×4): qty 16

## 2021-04-21 NOTE — Assessment & Plan Note (Addendum)
Nutrition Status: ?Nutrition Problem: Moderate Malnutrition ?Etiology: chronic illness (CHF, CKD IV) ?Signs/Symptoms: mild fat depletion, moderate muscle depletion, energy intake < or equal to 75% for > or equal to 1 month ?Interventions: MVI, Nepro shake, Liberalize Diet ? ? ? ?

## 2021-04-21 NOTE — Progress Notes (Signed)
?  Progress Note ? ? ?Patient: Blake West:[id number] DOB: 1950/06/27 DOA: 04/19/2021     2 ?DOS: the patient was seen and examined on 04/21/2021 ?  ?Brief hospital course: ?Mr. Tyler Cubit is a 71 year old male with history of biventricular heart failure, hypertension, gout, chronic heart failure reduced ejection fraction, history of glomerulonephritis, CKD stage IV, who presents emergency department from outpatient clinic for chief concerns of renal failure.  Patient has been followed by nephrology in the office, recommended work-up for vasculitis, but patient has not been interested per Dr. Candiss Norse. ?Patient has been having coughing and shortness of breath for 2 weeks, nausea, vomiting and diarrhea for 1 week. ?Upon arriving the hospital, patient had a serum creatinine of 15, bicarb 8, potassium 6.8.  Chest CT scan showed scattered nodular groundglass airspace disease. ?Patient is treated with bicarb drip, Zithromax, vancomycin and cefepime. ? ? ? ?Assessment and Plan: ?* Acute renal failure superimposed on stage 4 chronic kidney disease ([place]) ?--Cr 15 on presentation, BUN 247. ?Patient had a chronic kidney disease stage IV, etiology still unclear.  Worsening renal function due to dehydration from diarrhea. ?Patient also has significant hyperkalemia and metabolic acidosis.   ?Plan: ?--dialysis cath placement tomorrow to start dialysis ? ?Malnutrition of moderate degree ?--supplements per dietician  ? ?High anion gap metabolic acidosis ?This is secondary to acute on chronic renal failure.  ?--resume Na bicarb@100  ? ?Chronic atrial fibrillation with RVR ([facility]) ?--cont Toprol ?--Will consider anticoagulation after renal function improves. ? ?Multifocal pneumonia ?Mild elevation of procalcitonin level in the setting of acute renal failure.  MRSA culture negative, discontinued vancomycin.   ?--cont Rocephin and Zithromax. ? ?Hyperkalemia ?Potassium has normalized.  This is secondary to acute kidney injury as well as  severe metabolic acidosis. ? ?Diarrhea ?Patient has been having diarrhea for 1 week, but none since presentation, so C diff and GI path could not be collected. ? ?Severe sepsis with acute organ dysfunction ([place]) ?Patient has severe sepsis with tachycardia, tachypnea, leukocytosis and a severe renal failure.  This is most likely due to pneumonia. ?He has received IV fluids.  ? ? ?Chronic HFrEF (heart failure with reduced ejection fraction) ([place]) ?Acute CHF ruled out. ?Reviewed echocardiogram performed/2021, ejection fraction 20 to 25%. ?Patient was volume depleted at time of admission, no exacerbation of congestive heart failure.  ?Patient was not on evidence-based congestive heart failure treatment. ?--started metoprolol succinate at 50 mg daily to help control the heart rate. ? ?Hypothyroidism ?Continue Synthroid. ? ?Hyperlipidemia ?Not currently treated. ? ?Essential hypertension ?Blood pressure stable ?--cont Toprol ? ? ? ? ?  ? ?Subjective:  ?Pt complained of nausea, unable to tolerate oral intake, and pain all over. ? ? ?Physical Exam: ? ?Constitutional: NAD, AAOx3, sitting at edge of bed ?HEENT: conjunctivae and lids normal, EOMI ?CV: No cyanosis.   ?RESP: normal respiratory effort, on RA ?SKIN: warm, dry ?Neuro: II - XII grossly intact.   ?Psych: depressed mood and affect.  Appropriate judgement and reason ? ? ?Data Reviewed: ? ?Family Communication: wife updated at bedside today ? ?Disposition: ?Status is: Inpatient ?Remains inpatient appropriate because: starting dialysis ? ? Planned Discharge Destination: Home ? ? ? ?Time spent: 50 minutes ? ?Author: ?Enzo Bi, MD ?04/21/2021 9:23 PM ? ?For on call review [email or web address]  ?

## 2021-04-21 NOTE — Progress Notes (Signed)
Occupational Therapy Treatment ?Patient Details ?Name: Blake West ?MRN: [id number] ?DOB: 01/09/1951 ?Today's Date: 04/21/2021 ? ? ?History of present illness 71 year old male with history of biventricular heart failure, hypertension, gout, chronic heart failure reduced ejection fraction, history of glomerulonephritis, CKD stage IV, who presents emergency department from outpatient clinic for chief concerns of renal failure.  Patient has been having coughing and shortness of breath for 2 weeks, nausea, vomiting and diarrhea for 1 week. Pt admitted for severe sepsis with acute organ dysfunction most likely due to pneumonia ?  ?OT comments ? Chart reviewed, pt greeted with wife in room endorsing fatigue but agreeable to OT tx session. Tx session targeted progressing functional endurance, strength through ADL task participation. Pt with fair participation during session, performing STS approx 3x with MIN A for dressing tasks, MOD A required for LB dressing. Pt left in bed, NAD, all needs met. Discharge recommendation remains appropriate. OT will continue to follow while admitted.   ? ?Recommendations for follow up therapy are one component of a multi-disciplinary discharge planning process, led by the attending physician.  Recommendations may be updated based on patient status, additional functional criteria and insurance authorization. ?   ?Follow Up Recommendations ? Skilled nursing-short term rehab (<3 hours/day)  ?  ?Assistance Recommended at Discharge Frequent or constant Supervision/Assistance  ?Patient can return home with the following ? A lot of help with bathing/dressing/bathroom;A lot of help with walking and/or transfers ?  ?Equipment Recommendations ? Other (comment) (per next venue of care)  ?  ?Recommendations for Other Services   ? ?  ?Precautions / Restrictions Precautions ?Precautions: Fall ?Precaution Comments: orthostatics ?Restrictions ?Weight Bearing Restrictions: No  ? ? ?  ? ?Mobility Bed  Mobility ?Overal bed mobility: Needs Assistance ?Bed Mobility: Supine to Sit, Sit to Supine ?  ?  ?Supine to sit: Supervision, HOB elevated ?Sit to supine: Supervision, HOB elevated ?  ?  ?  ? ?Transfers ?Overall transfer level: Needs assistance ?Equipment used: Rolling walker (2 wheels) ?Transfers: Sit to/from Stand ?Sit to Stand: Min assist ?  ?  ?  ?  ?  ?General transfer comment: with RW at edge of bed ?  ?  ?Balance Overall balance assessment: Needs assistance ?Sitting-balance support: Feet supported, Bilateral upper extremity supported ?Sitting balance-Leahy Scale: Fair ?  ?  ?  ?Standing balance-Leahy Scale: Fair ?  ?  ?  ?  ?  ?  ?  ?  ?  ?  ?  ?  ?   ? ?ADL either performed or assessed with clinical judgement  ? ?ADL Overall ADL's : Needs assistance/impaired ?  ?  ?Grooming: Wash/dry hands;Set up;Sitting ?  ?  ?  ?  ?  ?  ?  ?Lower Body Dressing: Moderate assistance;Sit to/from stand ?  ?  ?  ?Toileting- Clothing Manipulation and Hygiene: Moderate assistance;Sit to/from stand ?  ?  ?  ?  ?General ADL Comments: fatigued throughout ?  ? ?Extremity/Trunk Assessment   ?  ?  ?  ?  ?  ? ?Vision   ?  ?  ?Perception   ?  ?Praxis   ?  ? ?Cognition Arousal/Alertness: Awake/alert ?Behavior During Therapy: [facility] for tasks assessed/performed ?Overall Cognitive Status: Within Functional Limits for tasks assessed ?  ?  ?  ?  ?  ?  ?  ?  ?  ?  ?  ?  ?  ?  ?  ?  ?  ?  ?  ?   ?  Exercises Other Exercises ?Other Exercises: edu re: role of OT, role of rehab, ADL participation with activity modification ? ?  ?Shoulder Instructions   ? ? ?  ?General Comments Pt reports fatigue throughout tx session, HR max up to 120, Spo2 WFL, BP WFL  ? ? ?Pertinent Vitals/ Pain       Pain Assessment ?Pain Assessment: No/denies pain ? ?Home Living   ?  ?  ?  ?  ?  ?  ?  ?  ?  ?  ?  ?  ?  ?  ?  ?  ?  ?  ? ?  ?Prior Functioning/Environment    ?  ?  ?  ?   ? ?Frequency ? Min 2X/week  ? ? ? ? ?  ?Progress Toward Goals ? ?OT Goals(current goals can now  be found in the care plan section) ? Progress towards OT goals: Progressing toward goals ? ?Acute Rehab OT Goals ?Patient Stated Goal: get stronger ?OT Goal Formulation: With patient/family ?Time For Goal Achievement: 05/05/21 ?Potential to Achieve Goals: Good  ?Plan Discharge plan remains appropriate   ? ?Co-evaluation ? ? ?   ?  ?  ?  ?  ? ?  ?AM-PAC OT "6 Clicks" Daily Activity     ?Outcome Measure ? ? Help from another person eating meals?: None ?Help from another person taking care of personal grooming?: A Little ?Help from another person toileting, which includes using toliet, bedpan, or urinal?: A Lot ?Help from another person bathing (including washing, rinsing, drying)?: A Lot ?Help from another person to put on and taking off regular upper body clothing?: A Little ?Help from another person to put on and taking off regular lower body clothing?: A Lot ?6 Click Score: 16 ? ?  ?End of Session Equipment Utilized During Treatment: Rolling walker (2 wheels) ? ?OT Visit Diagnosis: Unsteadiness on feet (R26.81);Muscle weakness (generalized) (M62.81) ?  ?Activity Tolerance Patient limited by fatigue ?  ?Patient Left in bed;with call bell/phone within reach;with bed alarm set;with family/visitor present ?  ?Nurse Communication Mobility status ?  ? ?   ? ?Time: 1950-9326 ?OT Time Calculation (min): 26 min ? ?Charges: OT General Charges ?$OT Visit: 1 Visit ?OT Treatments ?$Self Care/Home Management : 23-37 mins ? ?[name], OTD OTR/L  ?04/21/21, 4:04 PM  ?

## 2021-04-21 NOTE — Evaluation (Signed)
Physical Therapy Evaluation ?Patient Details ?Name: Blake West ?MRN: [id number] ?DOB: 03-29-1950 ?Today's Date: 04/21/2021 ? ?History of Present Illness ? 71 year old male with history of biventricular heart failure, hypertension, gout, chronic heart failure reduced ejection fraction, history of glomerulonephritis, CKD stage IV, who presents emergency department from outpatient clinic for chief concerns of renal failure.  Patient has been having coughing and shortness of breath for 2 weeks, nausea, vomiting and diarrhea for 1 week. Pt admitted for severe sepsis with acute organ dysfunction most likely due to pneumonia ?  ?Clinical Impression ? Physical Therapy Evaluation completed this date. Patient tolerated session fairly and was agreeable to treatment. Upon entry into room patient was supine in bed, laying in soiled linens with daughter present. Patient reported no pain. PLOF and Home set up received from chart review from OT evaluation yesterday. Patient was Mod I prior to hospitalization, has a RW for community distances and furniture surfs within the home.   ? ?Patient demonstrates generalized weakness in BLEs (at least 3+/5 - not formally assessed as patient urgently had to use to bathroom upon entry. Patient was able to demonstrate SUP for all bed mobility, and Min A-CGA for x2 sit to stands from EOB and x1 sit to stand from [place], patient required MAX A assist for pericare at bedside, however only Min A (for thoroughness) when utilizes the bathroom. Patient was able to ambulate to/from the bathroom at [facility] with RW. When utilizing the bathroom, patient reported increased dizziness with nausea. Patient was able to ambulate back to bed at [facility] with RW and BP taken (147/4mmHg). Shortly after getting into bed, patient reported a decrease in dizziness and nausea. Patient would continue to benefit from skilled physical therapy in order to optimize patient's return to PLOF. Recommend STR upon discharge from acute  hospitalization.  ?   ? ?Recommendations for follow up therapy are one component of a multi-disciplinary discharge planning process, led by the attending physician.  Recommendations may be updated based on patient status, additional functional criteria and insurance authorization. ? ?Follow Up Recommendations Skilled nursing-short term rehab (<3 hours/day) ? ?  ?Assistance Recommended at Discharge Frequent or constant Supervision/Assistance  ?Patient can return home with the following ? A little help with walking and/or transfers;Help with stairs or ramp for entrance;Assist for transportation;A little help with bathing/dressing/bathroom ? ?  ?Equipment Recommendations Other (comment) (Defer to next level of care)  ?Recommendations for Other Services ?    ?  ?Functional Status Assessment Patient has had a recent decline in their functional status and demonstrates the ability to make significant improvements in function in a reasonable and predictable amount of time.  ? ?  ?Precautions / Restrictions Precautions ?Precautions: Fall ?Restrictions ?Weight Bearing Restrictions: No  ? ?  ? ?Mobility ? Bed Mobility ?Overal bed mobility: Needs Assistance ?Bed Mobility: Supine to Sit ?  ?  ?Supine to sit: Supervision ?  ?  ?General bed mobility comments: increased time and effort, cueing for hand placemtn on bed rail to assist ?Patient Response: Cooperative ? ?Transfers ?Overall transfer level: Needs assistance ?Equipment used: Rolling walker (2 wheels) ?Transfers: Sit to/from Stand ?Sit to Stand: Min assist, Min guard ?  ?  ?  ?  ?  ?General transfer comment: x2 from EOB, x1 commode, initially Min A however progressed to CGA ?  ? ?Ambulation/Gait ?Ambulation/Gait assistance: Min guard ?Gait Distance (Feet): 20 Feet ?Assistive device: Rolling walker (2 wheels) ?Gait Pattern/deviations: Decreased step length - right, Decreased step length - left,  Decreased stride length, Trunk flexed, Narrow base of support ?Gait velocity:  decreased ?  ?  ?General Gait Details: mild unsteadiness noted ? ?Stairs ?  ?  ?  ?  ?  ? ?Wheelchair Mobility ?  ? ?Modified Rankin (Stroke Patients Only) ?  ? ?  ? ?Balance Overall balance assessment: Needs assistance ?Sitting-balance support: Feet supported, Bilateral upper extremity supported ?Sitting balance-Leahy Scale: Fair ?  ?  ?  ?Standing balance-Leahy Scale: Fair ?Standing balance comment: requires CGA to maintain standing balance, increased forward flexion in static standing with RW ?  ?  ?  ?  ?  ?  ?  ?  ?  ?  ?  ?   ? ? ? ?Pertinent Vitals/Pain Pain Assessment ?Pain Assessment: No/denies pain ?Pain Intervention(s): Monitored during session, RN gave pain meds during session  ? ? ?Home Living Family/patient expects to be discharged to:: Private residence ?Living Arrangements: Spouse/significant other ?Available Help at Discharge: Family;Available 24 hours/day ?Type of Home: House ?Home Access: Stairs to enter ?Entrance Stairs-Rails: Right ?Entrance Stairs-Number of Steps: 3 ?Alternate Level Stairs-Number of Steps: Flight ?Home Layout: Two level;Able to live on main level with bedroom/bathroom ?Home Equipment: Conservation officer, nature (2 wheels) ?   ?  ?Prior Function Prior Level of Function : Independent/Modified Independent ?  ?  ?  ?  ?  ?  ?Mobility Comments: Pt reports being MOD-I with functional mobility ("furniture surfs" in the house and uses RW for community mobility). Pt endorses multiple falls over past 6 months ?ADLs Comments: Pt reports being independent with ADLs. Pt enjoys tending to his chickens outside his house ?  ? ? ?Hand Dominance  ?   ? ?  ?Extremity/Trunk Assessment  ? Upper Extremity Assessment ?Upper Extremity Assessment: Generalized weakness ?  ? ?Lower Extremity Assessment ?Lower Extremity Assessment: Generalized weakness (at least 3+/5 strength bilaterally- not formally assessed) ?  ? ?   ?Communication  ? Communication: No difficulties  ?Cognition Arousal/Alertness:  Awake/alert ?Behavior During Therapy: [facility] for tasks assessed/performed ?Overall Cognitive Status: Within Functional Limits for tasks assessed ?  ?  ?  ?  ?  ?  ?  ?  ?  ?  ?  ?  ?  ?  ?  ?  ?General Comments: Requires increased processing time, A&Ox3 self location situation, mild confusion concerns with conversation ?  ?  ? ?  ?General Comments General comments (skin integrity, edema, etc.): HR ranged from 109-124bpm throughout session, Once supine after using bathroom BP (147/72mmHg), O2 98% ? ?  ?Exercises Other Exercises ?Other Exercises: Patient educated on role of PT in acute setting, educated on fall risk, d/c recommendations  ? ?Assessment/Plan  ?  ?PT Assessment Patient needs continued PT services  ?PT Problem List Decreased strength;Decreased mobility;Decreased safety awareness;Decreased knowledge of precautions;Decreased activity tolerance;Decreased balance;Pain;Decreased cognition;Decreased knowledge of use of DME ? ?   ?  ?PT Treatment Interventions DME instruction;Therapeutic activities;Gait training;Therapeutic exercise;Stair training;Balance training;Patient/family education;Cognitive remediation   ? ?PT Goals (Current goals can be found in the Care Plan section)  ?Acute Rehab PT Goals ?Patient Stated Goal: to get better ?PT Goal Formulation: With patient ?Time For Goal Achievement: 05/05/21 ?Potential to Achieve Goals: Good ? ?  ?Frequency Min 2X/week ?  ? ? ?Co-evaluation   ?  ?  ?  ?  ? ? ?  ?AM-PAC PT "6 Clicks" Mobility  ?Outcome Measure Help needed turning from your back to your side while in a flat bed without using bedrails?: A  Little ?Help needed moving from lying on your back to sitting on the side of a flat bed without using bedrails?: A Little ?Help needed moving to and from a bed to a chair (including a wheelchair)?: A Little ?Help needed standing up from a chair using your arms (e.g., wheelchair or bedside chair)?: A Little ?Help needed to walk in hospital room?: A Lot ?Help needed  climbing 3-5 steps with a railing? : A Lot ?6 Click Score: 16 ? ?  ?End of Session Equipment Utilized During Treatment: Gait belt ?Activity Tolerance: Patient tolerated treatment well;Patient limited by fatigue ?Patient left: i

## 2021-04-21 NOTE — TOC CM/SW Note (Signed)
PT/OT recommending SNF placement. Nephrology following for potential HD start. Will follow progress and begin SNF workup when appropriate. ? ?Dayton Scrape, [place] ?[phone] ? ?

## 2021-04-21 NOTE — Progress Notes (Signed)
OT Cancellation Note ? ?Patient Details ?Name: Blake West ?MRN: [id number] ?DOB: [birth year] ? ? ?Cancelled Treatment:    Reason Eval/Treat Not Completed: Other (comment) (pt reports fatigue from PT tx session, requesting to rest at this time. Will re-attempt as able.) ? ?[name], OTD OTR/L  ?04/21/21, 12:02 PM  ?

## 2021-04-21 NOTE — Progress Notes (Addendum)
?[facility]  ?ROUNDING NOTE  ? ?Subjective:  ? 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  Patient presents to the emergency department with complaints of nausea, vomiting, generalized decline.  Patient has been admitted for Hyperkalemia [E87.5] ?Atrial fibrillation with RVR ([place]) [I48.91] ?Severe sepsis with acute organ dysfunction ([place]) [A41.9, R65.20] ?Acute renal failure, unspecified acute renal failure type ([place]) [N17.9] ? ? ?Update: ?Patient seen resting in bed ?Wife and daughter at bedside ?Appetite remains poor  ?Denies shortness of breath ?No lower extremity edema ?Patient seen later in morning after discussing dialysis with more family at bedside. Patient states he is agreeable to dialysis if needed.  ? ?Objective:  ?Vital signs in last 24 hours:  ?Temp:  [97.5 ?F (36.4 ?C)-98.4 ?F (36.9 ?C)] 97.9 ?F (36.6 ?C) (03/29 1135) ?Pulse Rate:  [86-139] 102 (03/29 1135) ?Resp:  [15-20] 18 (03/29 1135) ?BP: (102-123)/(55-92) 102/67 (03/29 1555) ?SpO2:  [94 %-95 %] 94 % (03/29 0513) ? ?Weight change:  ?Filed Weights  ? 04/19/21 1816 04/20/21 0100  ?Weight: 83 kg 86 kg  ? ? ?Intake/Output: ?I/O last 3 completed shifts: ?In: 3885.7 [P.O.:240; I.V.:2294.7; IV AJOINOMVE:7209] ?Out: 1900 [Urine:1900] ?  ?Intake/Output this shift: ? Total I/O ?In: 240 [P.O.:240] ?Out: 550 [Urine:550] ? ?Physical Exam: ?General: NAD, lying in bed  ?Head: Normocephalic, atraumatic.  Dry oral mucosal membranes  ?Eyes: Anicteric  ?Lungs:  Clear to auscultation, normal effort, room air  ?Heart: Regular rate and rhythm  ?Abdomen:  Soft, nontender, nondistended  ?Extremities: No peripheral edema.  ?Neurologic: Alert and oriented, moving all four extremities  ?Skin: No lesions  ?Access: None  ? ? ?Basic Metabolic Panel: ?Recent Labs  ?Lab  04/19/21 ?2110 04/20/21 ?4709 04/21/21 ?0434  ?NA 135 139 139  ?K 6.8* 5.1 4.2  ?CL 103 106 103  ?CO2 8* 9* 14*  ?GLUCOSE 116* 119* 101*  ?BUN 247* 195* 220*  ?CREATININE 15.05* 14.69* 13.87*  ?CALCIUM 9.6 8.7* 8.1*  ?MG 3.2* 2.9*  --   ?PHOS  --  13.2*  --   ? ? ? ?Liver Function Tests: ?Recent Labs  ?Lab 04/19/21 ?2110 04/21/21 ?0434  ?AST 11* 12*  ?ALT 9 10  ?ALKPHOS 68 48  ?BILITOT 1.0 1.3*  ?PROT 8.1 6.2*  ?ALBUMIN 3.7 3.0*  ? ? ?Recent Labs  ?Lab 04/19/21 ?2110  ?LIPASE 88*  ? ? ?No results for input(s): AMMONIA in the last 168 hours. ? ?CBC: ?Recent Labs  ?Lab 04/19/21 ?2110 04/20/21 ?6283 04/21/21 ?0434  ?WBC 15.7* 14.4* 13.1*  ?NEUTROABS 14.3* 11.4* 10.3*  ?HGB 13.3 10.6* 10.1*  ?HCT 40.5 32.0* 29.6*  ?MCV 86.9 87.0 84.3  ?PLT 267 221 205  ? ? ? ?Cardiac Enzymes: ?No results for input(s): CKTOTAL, CKMB, CKMBINDEX, TROPONINI in the last 168 hours. ? ?BNP: ?Invalid input(s): POCBNP ? ?CBG: ?Recent Labs  ?Lab 04/20/21 ?1139 04/20/21 ?1617 04/20/21 ?2121 04/21/21 ?6629 04/21/21 ?1050  ?GLUCAP 127* 116* 99 104* 92  ? ? ? ?Microbiology: ?Results for orders placed or performed during the hospital encounter of 04/19/21  ?Resp Panel by RT-PCR (Flu A&B, Covid) Nasopharyngeal Swab     Status: None  ? Collection Time: 04/19/21  9:14 PM  ? Specimen: Nasopharyngeal Swab; Nasopharyngeal(NP) swabs in vial transport medium  ?Result Value Ref Range Status  ? SARS Coronavirus 2 by RT PCR NEGATIVE NEGATIVE Final  ?  Comment: (NOTE) ?SARS-CoV-2 target nucleic acids are NOT DETECTED. ? ?The SARS-CoV-2 RNA is generally detectable in upper respiratory ?specimens during the acute phase of infection. The lowest ?concentration of SARS-CoV-2 viral copies this assay can detect is ?138 copies/mL. A negative result does not preclude SARS-Cov-2 ?infection and should not be used as the sole basis for treatment or ?other patient management decisions. A negative result may occur with  ?improper specimen collection/handling, submission of  specimen other ?than nasopharyngeal swab, presence of viral mutation(s) within the ?areas targeted by this assay, and inadequate number of viral ?copies(<138 copies/mL). A negative result must be combined with ?clinical observations, patient history, and epidemiological ?information. The expected result is Negative. ? ?Fact Sheet for Patients:  ?EntrepreneurPulse.com.au ? ?Fact Sheet for Healthcare Providers:  ?IncredibleEmployment.be ? ?This test is no t yet approved or cleared by the [place] FDA and  ?has been authorized for detection and/or diagnosis of SARS-CoV-2 by ?FDA under an Emergency Use Authorization (EUA). This EUA will remain  ?in effect (meaning this test can be used) for the duration of the ?COVID-19 declaration under Section 564(b)(1) of the Act, 21 ?U.S.C.section 360bbb-3(b)(1), unless the authorization is terminated  ?or revoked sooner.  ? ? ?  ? Influenza A by PCR NEGATIVE NEGATIVE Final  ? Influenza B by PCR NEGATIVE NEGATIVE Final  ?  Comment: (NOTE) ?The Xpert Xpress SARS-CoV-2/FLU/RSV plus assay is intended as an aid ?in the diagnosis of influenza from Nasopharyngeal swab specimens and ?should not be used as a sole basis for treatment. Nasal washings and ?aspirates are unacceptable for Xpert Xpress SARS-CoV-2/FLU/RSV ?testing. ? ?Fact Sheet for Patients: ?EntrepreneurPulse.com.au ? ?Fact Sheet for Healthcare Providers: ?IncredibleEmployment.be ? ?This test is not yet approved or cleared by the [place] FDA and ?has been authorized for detection and/or diagnosis of SARS-CoV-2 by ?FDA under an Emergency Use Authorization (EUA). This EUA will remain ?in effect (meaning this test can be used) for the duration of the ?COVID-19 declaration under Section 564(b)(1) of the Act, 21 U.S.C. ?section 360bbb-3(b)(1), unless the authorization is terminated or ?revoked. ? ?Performed at [facility], [place], ?[place] [address] ?  ?Culture, blood (routine x 2)     Status: None (Preliminary result)  ? Collection Time: 04/19/21  9:45 PM  ? Specimen: BLOOD  ?Result Value Ref Range Status  ? Specimen Description BLOOD RIGHT ANTECUBITAL  Final  ? Special Requests   Final  ?  BOTTLES DRAWN AEROBIC AND ANAEROBIC Blood Culture adequate volume  ? Culture   Final  ?  NO GROWTH 2 DAYS ?Performed at [facility], [address], [place], [place] [address] ?  ? Report Status PENDING  Incomplete  ?Culture, blood (routine x 2)     Status: None (Preliminary result)  ? Collection Time: 04/19/21 10:32 PM  ? Specimen: BLOOD  ?Result Value Ref Range Status  ? Specimen Description BLOOD LEFT ASSIST CONTROL  Final  ? Special Requests   Final  ?  BOTTLES DRAWN AEROBIC AND ANAEROBIC Blood Culture adequate volume  ? Culture   Final  ?  NO GROWTH 2 DAYS ?Performed at [facility], [address] [place], [place] [address] ?  ? Report Status PENDING  Incomplete  ?MRSA Next Gen by PCR, Nasal     Status: None  ? Collection Time: 04/20/21  2:21 AM  ? Specimen: Nasal Mucosa; Nasal Swab  ?Result Value Ref Range Status  ? MRSA by PCR Next Gen NOT DETECTED  NOT DETECTED Final  ?  Comment: (NOTE) ?The GeneXpert MRSA Assay (FDA approved for NASAL specimens only), ?is one component of a comprehensive MRSA colonization surveillance ?program. It is not intended to diagnose MRSA infection nor to guide ?or monitor treatment for MRSA infections. ?Test performance is not FDA approved in patients less than 2 years ?old. ?Performed at [facility], [place], ?[place] [address] ?  ?Urine Culture     Status: None  ? Collection Time: 04/20/21  5:45 AM  ? Specimen: Urine, Clean Catch  ?Result Value Ref Range Status  ? Specimen Description   Final  ?  URINE, CLEAN CATCH ?Performed at [facility], [address], [place], [place] [address] ?  ? Special Requests   Final  ?  NONE ?Performed at [facility], [address] [place], [place] [address] ?  ? Culture   Final  ?  NO GROWTH ?Performed at [facility], [place] [address], [place], [place] [address] ?  ? Report Status 04/21/2021 FINAL  Final

## 2021-04-22 ENCOUNTER — Inpatient Hospital Stay
Admission: EM | Disposition: A | Discharge status: Home Health | Payer: Self-pay | Source: Ambulatory Visit | Attending: Hospitalist

## 2021-04-22 ENCOUNTER — Encounter: Payer: Self-pay | Admitting: Vascular Surgery

## 2021-04-22 DIAGNOSIS — N184 Chronic kidney disease, stage 4 (severe): Secondary | ICD-10-CM | POA: Diagnosis not present

## 2021-04-22 DIAGNOSIS — N170 Acute kidney failure with tubular necrosis: Secondary | ICD-10-CM | POA: Diagnosis not present

## 2021-04-22 HISTORY — PX: DIALYSIS/PERMA CATHETER INSERTION: CATH118288

## 2021-04-22 LAB — GLUCOSE, CAPILLARY
Glucose-Capillary: 184 mg/dL — ABNORMAL HIGH (ref 70–99)
Glucose-Capillary: 176 mg/dL — ABNORMAL HIGH (ref 70–99)
Glucose-Capillary: 194 mg/dL — ABNORMAL HIGH (ref 70–99)
Glucose-Capillary: 195 mg/dL — ABNORMAL HIGH (ref 70–99)

## 2021-04-22 LAB — HEPATITIS B SURFACE ANTIBODY, QUANTITATIVE: Hep B S AB Quant (Post): <3.1 m[IU]/mL — ABNORMAL LOW (ref >9.9)

## 2021-04-22 LAB — C3 COMPLEMENT: C3 Complement: 98 mg/dL (ref 82–167)

## 2021-04-22 LAB — HEPATITIS B CORE ANTIBODY, TOTAL: Hep B Core Total Ab: NONREACTIVE

## 2021-04-22 LAB — CBC
HCT: 30.2 % — ABNORMAL LOW (ref 39.0–52.0)
Hemoglobin: 10 g/dL — ABNORMAL LOW (ref 13.0–17.0)
MCH: 28.1 pg (ref 26.0–34.0)
MCHC: 33.1 g/dL (ref 30.0–36.0)
MCV: 84.8 fL (ref 80.0–100.0)
Platelets: 197 10*3/uL (ref 150–400)
RBC: 3.56 MIL/uL — ABNORMAL LOW (ref 4.22–5.81)
RDW: 14.5 % (ref 11.5–15.5)
WBC: 7 10*3/uL (ref 4.0–10.5)
nRBC: 0 % (ref 0.0–0.2)

## 2021-04-22 LAB — BASIC METABOLIC PANEL
Anion gap: 25 — ABNORMAL HIGH (ref 5–15)
BUN: 159 mg/dL — ABNORMAL HIGH (ref 8–23)
CO2: 12 mmol/L — ABNORMAL LOW (ref 22–32)
Calcium: 8.1 mg/dL — ABNORMAL LOW (ref 8.9–10.3)
Chloride: 102 mmol/L (ref 98–111)
Creatinine, Ser: 14.09 mg/dL — ABNORMAL HIGH (ref 0.61–1.24)
GFR, Estimated: 3 mL/min — ABNORMAL LOW (ref >60)
Glucose, Bld: 171 mg/dL — ABNORMAL HIGH (ref 70–99)
Potassium: 4.8 mmol/L (ref 3.5–5.1)
Sodium: 139 mmol/L (ref 135–145)

## 2021-04-22 LAB — C4 COMPLEMENT: Complement C4, Body Fluid: 26 mg/dL (ref 12–38)

## 2021-04-22 LAB — C-REACTIVE PROTEIN: CRP: 10.8 mg/dL — ABNORMAL HIGH (ref <1.0)

## 2021-04-22 LAB — MAGNESIUM: Magnesium: 2.9 mg/dL — ABNORMAL HIGH (ref 1.7–2.4)

## 2021-04-22 SURGERY — DIALYSIS/PERMA CATHETER INSERTION
Anesthesia: Moderate Sedation

## 2021-04-22 MED ORDER — SODIUM CHLORIDE 0.9 % IV SOLN: 100.0000 mL | INTRAVENOUS | Status: DC | PRN

## 2021-04-22 MED ORDER — HYDROMORPHONE HCL 1 MG/ML IJ SOLN
1.0000 mg | Freq: Once | INTRAMUSCULAR | Status: DC | PRN
  Filled 2021-04-22: qty 1

## 2021-04-22 MED ORDER — HEPARIN SODIUM (PORCINE) 1000 UNIT/ML IJ SOLN
INTRAMUSCULAR | Status: AC
  Filled 2021-04-22: qty 10

## 2021-04-22 MED ORDER — PENTAFLUOROPROP-TETRAFLUOROETH EX AERO
1.0000 "application " | INHALATION_SPRAY | CUTANEOUS | Status: DC | PRN
  Filled 2021-04-22: qty 30

## 2021-04-22 MED ORDER — CEFAZOLIN SODIUM-DEXTROSE 2-4 GM/100ML-% IV SOLN: 2.0000 g | Freq: Once | INTRAVENOUS | Status: DC

## 2021-04-22 MED ORDER — ONDANSETRON HCL 4 MG/2ML IJ SOLN
4.0000 mg | Freq: Four times a day (QID) | INTRAMUSCULAR | Status: DC | PRN
Start: 2021-04-22 — End: 2021-04-30
  Administered 2021-04-22 – 2021-04-23 (×3): 4 mg via INTRAVENOUS
  Filled 2021-04-22: qty 2

## 2021-04-22 MED ORDER — LIDOCAINE-PRILOCAINE 2.5-2.5 % EX CREA
1.0000 "application " | TOPICAL_CREAM | CUTANEOUS | Status: DC | PRN
  Filled 2021-04-22: qty 5

## 2021-04-22 MED ORDER — MIDAZOLAM HCL 2 MG/ML PO SYRP: 8.0000 mg | ORAL_SOLUTION | Freq: Once | ORAL | Status: DC | PRN

## 2021-04-22 MED ORDER — LIDOCAINE HCL (PF) 1 % IJ SOLN
5.0000 mL | INTRAMUSCULAR | Status: DC | PRN
  Filled 2021-04-22: qty 5

## 2021-04-22 MED ORDER — MIDAZOLAM HCL 2 MG/2ML IJ SOLN
INTRAMUSCULAR | Status: DC | PRN
Start: 2021-04-22 — End: 2021-04-23
  Administered 2021-04-22: 1 mg via INTRAVENOUS

## 2021-04-22 MED ORDER — FENTANYL CITRATE (PF) 100 MCG/2ML IJ SOLN
INTRAMUSCULAR | Status: DC | PRN
  Administered 2021-04-22 (×2): 50 ug via INTRAVENOUS

## 2021-04-22 MED ORDER — HYDROMORPHONE HCL 1 MG/ML IJ SOLN
0.5000 mg | Freq: Once | INTRAMUSCULAR | Status: AC
  Administered 2021-04-22: 0.5 mg via INTRAVENOUS
  Filled 2021-04-22: qty 1

## 2021-04-22 MED ORDER — AZITHROMYCIN 250 MG PO TABS
500.0000 mg | ORAL_TABLET | Freq: Every day | ORAL | Status: AC
  Administered 2021-04-23: 500 mg via ORAL
  Filled 2021-04-22 (×2): qty 2

## 2021-04-22 MED ORDER — CHLORHEXIDINE GLUCONATE CLOTH 2 % EX PADS
6.0000 | MEDICATED_PAD | Freq: Every day | CUTANEOUS | Status: DC
  Administered 2021-04-23: 6 via TOPICAL

## 2021-04-22 MED ORDER — SODIUM CHLORIDE 0.9 % IV SOLN: INTRAVENOUS | Status: DC

## 2021-04-22 MED ORDER — CEFAZOLIN SODIUM-DEXTROSE 1-4 GM/50ML-% IV SOLN
INTRAVENOUS | Status: AC
  Administered 2021-04-22: 1 g via INTRAVENOUS
  Filled 2021-04-22: qty 50

## 2021-04-22 MED ORDER — METHYLPREDNISOLONE SODIUM SUCC 125 MG IJ SOLR
125.0000 mg | Freq: Once | INTRAMUSCULAR | Status: DC | PRN
Start: 2021-04-22 — End: 2021-04-23

## 2021-04-22 MED ORDER — HEPARIN SODIUM (PORCINE) 1000 UNIT/ML DIALYSIS
1000.0000 [IU] | INTRAMUSCULAR | Status: DC | PRN
  Administered 2021-04-27: 1000 [IU] via INTRAVENOUS_CENTRAL
  Filled 2021-04-22 (×2): qty 1

## 2021-04-22 MED ORDER — ALUM & MAG HYDROXIDE-SIMETH 200-200-20 MG/5ML PO SUSP
15.0000 mL | Freq: Four times a day (QID) | ORAL | Status: DC | PRN
  Administered 2021-04-22 – 2021-04-23 (×2): 15 mL via ORAL
  Filled 2021-04-22: qty 30

## 2021-04-22 MED ORDER — ALTEPLASE 2 MG IJ SOLR: 2.0000 mg | Freq: Once | INTRAMUSCULAR | Status: DC | PRN

## 2021-04-22 MED ORDER — CEFAZOLIN SODIUM-DEXTROSE 1-4 GM/50ML-% IV SOLN: 1.0000 g | Freq: Once | INTRAVENOUS | Status: AC

## 2021-04-22 MED ORDER — FENTANYL CITRATE (PF) 100 MCG/2ML IJ SOLN
INTRAMUSCULAR | Status: AC
  Filled 2021-04-22: qty 2

## 2021-04-22 MED ORDER — FAMOTIDINE 20 MG PO TABS
40.0000 mg | ORAL_TABLET | Freq: Once | ORAL | Status: DC | PRN
Start: 2021-04-22 — End: 2021-04-23

## 2021-04-22 MED ORDER — MIDAZOLAM HCL 2 MG/2ML IJ SOLN
INTRAMUSCULAR | Status: AC
  Filled 2021-04-22: qty 2

## 2021-04-22 MED ORDER — CALCIUM CARBONATE ANTACID 500 MG PO CHEW
1.0000 | CHEWABLE_TABLET | Freq: Three times a day (TID) | ORAL | Status: DC | PRN
Start: 2021-04-22 — End: 2021-04-30
  Administered 2021-04-29: 200 mg via ORAL
  Filled 2021-04-22 (×2): qty 1

## 2021-04-22 MED ORDER — DIPHENHYDRAMINE HCL 50 MG/ML IJ SOLN: 50.0000 mg | Freq: Once | INTRAMUSCULAR | Status: DC | PRN

## 2021-04-22 SURGICAL SUPPLY — 7 items
CATH PALIN MAXID VT KIT 19CM (CATHETERS) ×1 IMPLANT
COVER PROBE U/S 5X48 (MISCELLANEOUS) ×1 IMPLANT
DERMABOND ADVANCED (GAUZE/BANDAGES/DRESSINGS) ×1
DERMABOND ADVANCED .7 DNX12 (GAUZE/BANDAGES/DRESSINGS) IMPLANT
PACK ANGIOGRAPHY (CUSTOM PROCEDURE TRAY) ×1 IMPLANT
SUT MNCRL AB 4-0 PS2 18 (SUTURE) ×1 IMPLANT
SUT PROLENE 0 CT 1 30 (SUTURE) ×1 IMPLANT

## 2021-04-22 NOTE — Consult Note (Signed)
? ? ? ?PULMONOLOGY ? ? ? ? ? ? ? ? ?Date: 04/22/2021,   ?MRN# [id number] Blake West [birth year] ? ? ?  ?AdmissionWeight: 83 kg                 ?CurrentWeight: 86 kg ? ?Referring provider: Dr Candiss Norse ? ? ?CHIEF COMPLAINT:  ? ?Anca + vasculitis  ? ? ?HISTORY OF PRESENT ILLNESS  ? ?This is a pleasant 71 year old male with a history of CHF with reduced EF, coronary disease gout, dyslipidemia essential hypertension, hypothyroidism testicular dysfunction, type 2 diabetes, history of glomerulonephritis with CKD stage IV came in from outpatient clinic due to provider concerns for ongoing progressive renal failure.  Patient is essentially a lifelong non-smoker reporting smoking in the 1950s.  He does report mild and subacute and chronic cough with expectoration of whitish phlegm.  Patient reports decreased appetite as well as loose stools for less than 1 week prior to arrival.  In the emergency department patient had accelerated hypertension with tachycardia however normoxia on room air.  His initial creatinine was 15 with a GFR of 3 remainder of blood work was essentially unremarkable besides electrolyte derangements including potassium 6.8 without EKG changes.  He was negative for COVID influenza a and B on arrival.  He had chest imaging done without contrast which showed nodular groundglass airspace opacification of the lung.  There was concern for pneumonia and patient is empirically treated with cefepime and vancomycin and Zithromax on arrival.  Also received Lokelma for elevated potassium.  Spinal this patient was lucid without altered mentation.  CT imaging with lung windows were independently reviewed by me with findings of chronic bronchitic changes as well as punctate multifocal bilateral groundglass nodules these findings are nonspecific and can be seen with atypical infection, viral lower respiratory tract infection as well as inflammatory lung disease. ? ? ?PAST MEDICAL HISTORY  ? ?Past Medical History:   ?Diagnosis Date  ? Abnormal EKG   ? Biventricular failure ([place])   ? CAD (coronary artery disease)   ? Colorectal polyps   ? Gout   ? Hyperlipidemia LDL goal <70   ? Hypertension   ? Hypothyroidism   ? Kidney stone   ? Pneumonia   ? Testicular hypofunction   ? Type 2 diabetes mellitus ([place]) 04/26/2019  ? ? ? ?SURGICAL HISTORY  ? ?Past Surgical History:  ?Procedure Laterality Date  ? CARDIAC CATHETERIZATION    ? FOOT SURGERY    ? RIGHT/LEFT HEART CATH AND CORONARY ANGIOGRAPHY N/A 04/30/2019  ? Procedure: RIGHT/LEFT HEART CATH AND CORONARY ANGIOGRAPHY;  Surgeon: Troy Sine, MD;  Location: [facility];  Service: Cardiovascular;  Laterality: N/A;  ? ? ? ?FAMILY HISTORY  ? ?Family History  ?Problem Relation Age of Onset  ? Heart attack Mother 68  ? Pulmonary fibrosis Father   ? Congestive Heart Failure Brother   ? Heart attack Brother   ? Heart attack Brother   ? Early death Sister   ? Kidney disease Paternal Grandfather   ? Diabetes Sister   ? ? ? ?SOCIAL HISTORY  ? ?Social History  ? ?Tobacco Use  ? Smoking status: Former  ?  Packs/day: 1.00  ?  Years: 30.00  ?  Pack years: 30.00  ?  Types: Cigarettes  ?  Quit date: 01/24/2002  ?  Years since quitting: 19.2  ? Smokeless tobacco: Never  ?Vaping Use  ? Vaping Use: Never used  ?Substance Use Topics  ? Alcohol use: No  ?  Drug use: No  ? ? ? ?MEDICATIONS  ? ? ?Home Medication:  ?  ?Current Medication: ? ?Current Facility-Administered Medications:  ?  0.9 %  sodium chloride infusion, 100 mL, Intravenous, PRN, Breeze, Shantelle, NP ?  0.9 %  sodium chloride infusion, 100 mL, Intravenous, PRN, Colon Flattery, NP ?  acetaminophen (TYLENOL) tablet 650 mg, 650 mg, Oral, Q6H PRN, 650 mg at 04/21/21 2007 **OR** acetaminophen (TYLENOL) suppository 650 mg, 650 mg, Rectal, Q6H PRN, Cox, Amy N, DO ?  acidophilus (RISAQUAD) capsule 2 capsule, 2 capsule, Oral, TID, Enzo Bi, MD, 2 capsule at 04/22/21 2035 ?  albuterol (PROVENTIL) (2.5 MG/3ML) 0.083% nebulizer solution 2.5 mg,  2.5 mg, Nebulization, Q6H PRN, Cox, Amy N, DO ?  alteplase (CATHFLO ACTIVASE) injection 2 mg, 2 mg, Intracatheter, Once PRN, Colon Flattery, NP ?  alum & mag hydroxide-simeth (MAALOX/MYLANTA) 597-416-38 MG/5ML suspension 15 mL, 15 mL, Oral, Q6H PRN, Enzo Bi, MD, 15 mL at 04/22/21 1043 ?  azithromycin (ZITHROMAX) tablet 500 mg, 500 mg, Oral, QHS, Dolan, Carissa E, RPH ?  budesonide (PULMICORT) nebulizer solution 0.5 mg, 0.5 mg, Nebulization, Q6H PRN, Cox, Amy N, DO ?  calcium carbonate (TUMS - dosed in mg elemental calcium) chewable tablet 200 mg of elemental calcium, 1 tablet, Oral, TID PRN, Enzo Bi, MD ?  cefTRIAXone (ROCEPHIN) 2 g in sodium chloride 0.9 % 100 mL IVPB, 2 g, Intravenous, Q24H, Sharen Hones, MD, Last Rate: 200 mL/hr at 04/22/21 0542, 2 g at 04/22/21 0542 ?  Chlorhexidine Gluconate Cloth 2 % PADS 6 each, 6 each, Topical, Daily, Sharen Hones, MD, 6 each at 04/22/21 0800 ?  feeding supplement (NEPRO CARB STEADY) liquid 237 mL, 237 mL, Oral, BID BM, Sharen Hones, MD, 237 mL at 04/21/21 0954 ?  guaiFENesin (MUCINEX) 12 hr tablet 600 mg, 600 mg, Oral, BID, Sharen Hones, MD, 600 mg at 04/22/21 4536 ?  heparin injection 1,000 Units, 1,000 Units, Dialysis, PRN, Colon Flattery, NP ?  heparin injection 5,000 Units, 5,000 Units, Subcutaneous, Q8H, Cox, Amy N, DO, 5,000 Units at 04/21/21 2006 ?  insulin aspart (novoLOG) injection 0-5 Units, 0-5 Units, Subcutaneous, QHS, Cox, Amy N, DO ?  insulin aspart (novoLOG) injection 0-9 Units, 0-9 Units, Subcutaneous, TID WC, Cox, Amy N, DO, 2 Units at 04/22/21 0808 ?  levothyroxine (SYNTHROID) tablet 25 mcg, 25 mcg, Oral, Q0600, Cox, Amy N, DO, 25 mcg at 04/21/21 0531 ?  lidocaine (PF) (XYLOCAINE) 1 % injection 5 mL, 5 mL, Intradermal, PRN, Breeze, Shantelle, NP ?  lidocaine-prilocaine (EMLA) cream 1 application., 1 application., Topical, PRN, Colon Flattery, NP ?  methylPREDNISolone sodium succinate (SOLU-MEDROL) 1,000 mg in sodium chloride 0.9 % 50 mL IVPB,  1,000 mg, Intravenous, Daily, Breeze, Shantelle, NP, Last Rate: 66 mL/hr at 04/21/21 1515, 1,000 mg at 04/21/21 1515 ?  metoprolol succinate (TOPROL-XL) 24 hr tablet 50 mg, 50 mg, Oral, Daily, Sharen Hones, MD, 50 mg at 04/22/21 4680 ?  metoprolol tartrate (LOPRESSOR) injection 5 mg, 5 mg, Intravenous, Q2H PRN, Cox, Amy N, DO ?  multivitamin with minerals tablet 1 tablet, 1 tablet, Oral, Daily, Sharen Hones, MD, 1 tablet at 04/21/21 3212 ?  ondansetron (ZOFRAN) tablet 4 mg, 4 mg, Oral, Q6H PRN **OR** ondansetron (ZOFRAN) injection 4 mg, 4 mg, Intravenous, Q6H PRN, Cox, Amy N, DO, 4 mg at 04/22/21 1036 ?  pentafluoroprop-tetrafluoroeth (GEBAUERS) aerosol 1 application., 1 application., Topical, PRN, Colon Flattery, NP ?  sodium bicarbonate 150 mEq in dextrose 5 % 1,150 mL infusion, , Intravenous,  Continuous, Enzo Bi, MD, Last Rate: 75 mL/hr at 04/22/21 Shary Key Bag at 04/22/21 0025 ? ? ? ?ALLERGIES  ? ?Codeine ? ? ? ? ?REVIEW OF SYSTEMS  ? ? ?Review of Systems: ? ?Gen:  Denies  fever, sweats, chills weigh loss  ?HEENT: Denies blurred vision, double vision, ear pain, eye pain, hearing loss, nose bleeds, sore throat ?Cardiac:  No dizziness, chest pain or heaviness, chest tightness,edema ?Resp:   reports dyspnea chronically  ?Gi: Denies swallowing difficulty, stomach pain, nausea or vomiting, diarrhea, constipation, bowel incontinence ?Gu:  Denies bladder incontinence, burning urine ?Ext:   Denies Joint pain, stiffness or swelling ?Skin: Denies  skin rash, easy bruising or bleeding or hives ?Endoc:  Denies polyuria, polydipsia , polyphagia or weight change ?Psych:   Denies depression, insomnia or hallucinations  ? ?Other:  All other systems negative ? ? ?VS: BP (!) 138/97 (BP Location: Left Arm)   Pulse 96   Temp 97.9 ?F (36.6 ?C)   Resp 18   Ht 5\' 11"  (1.803 m)   Wt 86 kg   SpO2 96%   BMI 26.44 kg/m?   ? ? ? ?PHYSICAL EXAM  ? ? ?GENERAL:NAD, no fevers, chills, no weakness no fatigue ?HEAD: Normocephalic,  atraumatic.  ?EYES: Pupils equal, round, reactive to light. Extraocular muscles intact. No scleral icterus.  ?MOUTH: Moist mucosal membrane. Dentition intact. No abscess noted.  ?EAR, NOSE, THROAT: Clear without exudate

## 2021-04-22 NOTE — Progress Notes (Signed)
Patient started first dialysis treatment as ordered. Patient completed a 2 hour treatment with no s/s of adverse effects noted or reported. Patient became agitated and wanting to get out of bed 15 minutes prior to ending of treatment, instructed to remain in bed and not take out monitoring devices, reinforcement needed. Patient with ongoing asymptomatic tachycardia, V/S within normal limits. Report given to Raj Janus, RN. ?

## 2021-04-22 NOTE — TOC Initial Note (Addendum)
Transition of Care (TOC) - Initial/Assessment Note  ? ? ?Patient Details  ?Name: Blake West ?MRN: [id number] ?Date of Birth: 04/25/1950 ? ?Transition of Care (TOC) CM/SW Contact:    ?Candie Chroman, LCSW ?Phone Number: ?04/22/2021, 12:35 PM ? ?Clinical Narrative:  Readmission prevention screen complete. CSW met with patient. Wife and two daughters at bedside. CSW introduced role and explained that discharge planning would be discussed. PCP is Adrian Prows, MD. Wife drives him to appointments. [facility]acy [facility] [address] in [place]. No issues obtaining medications. No home health prior to admission. He uses his wife's old walker for longer distances. Patient and family are not interested in SNF placement but agreeable to home health. [facility] is first preference. Representative is reviewing referral. No further concerns. CSW encouraged patient and his family to contact CSW as needed. CSW will continue to follow patient for support and facilitate return home when stable.               ? ?2:27 pm: [facility] can accept referral for PT, OT, and RN. No aide available. Patient off unit for procedure. Tried calling wife but voicemail is full. ? ?Expected Discharge Plan: [place] ?Barriers to Discharge: Continued Medical Work up ? ? ?Patient Goals and CMS Choice ?  ?  ?  ? ?Expected Discharge Plan and Services ?Expected Discharge Plan: [place] ?  ?  ?Post Acute Care Choice: Home Health, Durable Medical Equipment ?Living arrangements for the past 2 months: [place] ?                ?  ?  ?  ?  ?  ?  ?  ?  ?  ?  ? ?Prior Living Arrangements/Services ?Living arrangements for the past 2 months: [place] ?Lives with:: Spouse ?Patient language and need for interpreter reviewed:: Yes ?Do you feel safe going back to the place where you live?: Yes      ?Need for Family Participation in Patient Care: Yes (Comment) ?Care giver support system in place?: Yes  (comment) ?  ?Criminal Activity/Legal Involvement Pertinent to Current Situation/Hospitalization: No - Comment as needed ? ?Activities of Daily Living ?Home Assistive Devices/Equipment: None ?ADL Screening (condition at time of admission) ?Patient's cognitive ability adequate to safely complete daily activities?: No ?Is the patient deaf or have difficulty hearing?: No ?Does the patient have difficulty seeing, even when wearing glasses/contacts?: No ?Does the patient have difficulty concentrating, remembering, or making decisions?: No ?Patient able to express need for assistance with ADLs?: Yes ?Does the patient have difficulty dressing or bathing?: Yes ?Independently performs ADLs?: No ?Communication: Independent ?Dressing (OT): Needs assistance ?Is this a change from baseline?: Change from baseline, expected to last <3days ?Grooming: Needs assistance ?Is this a change from baseline?: Change from baseline, expected to last <3 days ?Feeding: Independent ?Bathing: Needs assistance ?Is this a change from baseline?: Change from baseline, expected to last <3 days ?Toileting: Needs assistance ?Is this a change from baseline?: Change from baseline, expected to last <3 days ?In/Out Bed: Needs assistance ?Is this a change from baseline?: Change from baseline, expected to last <3 days ?Walks in Home: Independent ?Does the patient have difficulty walking or climbing stairs?: No ?Weakness of Legs: Both ?Weakness of Arms/Hands: Both ? ?Permission Sought/Granted ?Permission sought to share information with : Facility Sport and exercise psychologist, Family Supports ?Permission granted to share information with : Yes, Verbal Permission Granted ? Share Information with NAME:  Susie Stimpson ? Permission granted to share info w AGENCY: [place] ? Permission granted to share info w Relationship: Wife ? Permission granted to share info w Contact Information: [phone] ? ?Emotional Assessment ?Appearance:: Appears stated  age ?Attitude/Demeanor/Rapport: Engaged, Gracious ?Affect (typically observed): Accepting, Appropriate, Calm, Pleasant ?Orientation: : Oriented to Self, Oriented to Place, Oriented to  Time, Oriented to Situation ?Alcohol / Substance Use: Not Applicable ?Psych Involvement: No (comment) ? ?Admission diagnosis:  Hyperkalemia [E87.5] ?Atrial fibrillation with RVR ([place]) [I48.91] ?Severe sepsis with acute organ dysfunction ([place]) [A41.9, R65.20] ?Acute renal failure, unspecified acute renal failure type ([place]) [N17.9] ?Patient Active Problem List  ? Diagnosis Date Noted  ? Malnutrition of moderate degree 04/21/2021  ? High anion gap metabolic acidosis 72/09/1978  ? Severe sepsis with acute organ dysfunction ([place]) 04/19/2021  ? Diarrhea 04/19/2021  ? Acute renal failure superimposed on stage 4 chronic kidney disease ([place]) 04/19/2021  ? Hyperkalemia 04/19/2021  ? Multifocal pneumonia 04/19/2021  ? Chronic atrial fibrillation with RVR ([place]) 04/19/2021  ? Cough productive of clear sputum 11/08/2019  ? Chronic HFrEF (heart failure with reduced ejection fraction) ([place]) 08/08/2019  ? Vitamin D deficiency 08/08/2019  ? Nonischemic cardiomyopathy ([place]) 06/27/2019  ? Coronary artery disease involving native coronary artery of native heart without angina pectoris 06/27/2019  ? Polycythemia 06/27/2019  ? Hypothyroidism 05/11/2019  ? Gout of multiple sites 05/11/2019  ? Type 2 diabetes mellitus ([place]) 04/26/2019  ? Hyperlipidemia 04/26/2019  ? Acute congestive heart failure ([place]) 04/25/2019  ? Essential hypertension 04/25/2019  ? OSA on CPAP 04/25/2019  ? Class 2 severe obesity due to excess calories with serious comorbidity and body mass index (BMI) of 36.0 to 36.9 in adult [facility]) 04/25/2019  ? ?PCP:  Leonel Ramsay, MD ?Pharmacy:   ?[facility] [address] ?[place] [address] ?Phone: [phone] Fax: [phone] ? ? ? ? ?Social Determinants of Health (SDOH)  Interventions ?  ? ?Readmission Risk Interventions ? ?  04/22/2021  ? 12:24 PM  ?Readmission Risk Prevention Plan  ?Transportation Screening Complete  ?PCP or Specialist Appt within 3-5 Days Complete  ?Social Work Consult for [place] Planning/Counseling Complete  ?Palliative Care Screening Not Applicable  ?Medication Review Press photographer) Complete  ? ? ? ?

## 2021-04-22 NOTE — Op Note (Signed)
OPERATIVE NOTE ? ? ? ?PRE-OPERATIVE DIAGNOSIS: 1. ESRD ? ? ?POST-OPERATIVE DIAGNOSIS: same as above ? ?PROCEDURE: ?Ultrasound guidance for vascular access to the right internal jugular vein ?Fluoroscopic guidance for placement of catheter ?Placement of a 19 cm tip to cuff tunneled hemodialysis catheter via the right internal jugular vein ? ?SURGEON: [name], MD ? ?ANESTHESIA:  Local with Moderate conscious sedation for approximately 12 minutes using 1 mg of Versed and 50 mcg of Fentanyl ? ?ESTIMATED BLOOD LOSS: 5 cc ? ?FLUORO TIME: less than one minute ? ?CONTRAST: none ? ?FINDING(S): ?1.  Patent right internal jugular vein ? ?SPECIMEN(S):  None ? ?INDICATIONS:   ?DIOGENES West is a 71 y.o.male who presents with renal failure.  The patient needs long term dialysis access for their ESRD, and a Permcath is necessary.  Risks and benefits are discussed and informed consent is obtained.   ? ?DESCRIPTION: ?After obtaining full informed written consent, the patient was brought back to the vascular suited. The patient's right neck and chest were sterilely prepped and draped in a sterile surgical field was created. Moderate conscious sedation was administered during a face to face encounter with the patient throughout the procedure with my supervision of the RN administering medicines and monitoring the patient's vital signs, pulse oximetry, telemetry and mental status throughout from the start of the procedure until the patient was taken to the recovery room.  The right internal jugular vein was visualized with ultrasound and found to be patent. It was then accessed under direct ultrasound guidance and a permanent image was recorded. A wire was placed. After skin nick and dilatation, the peel-away sheath was placed over the wire. ?I then turned my attention to an area under the clavicle. Approximately 1-2 fingerbreadths below the clavicle a small counterincision was created and tunneled from the subclavicular incision to  the access site. Using fluoroscopic guidance, a 19 centimeter tip to cuff tunneled hemodialysis catheter was selected, and tunneled from the subclavicular incision to the access site. It was then placed through the peel-away sheath and the peel-away sheath was removed. Using fluoroscopic guidance the catheter tips were parked in the right atrium. The appropriate distal connectors were placed. It withdrew blood well and flushed easily with heparinized saline and a concentrated heparin solution was then placed. It was secured to the chest wall with 2 Prolene sutures. The access incision was closed single 4-0 Monocryl. A 4-0 Monocryl pursestring suture was placed around the exit site. Sterile dressings were placed. ?The patient tolerated the procedure well and was taken to the recovery room in stable condition. ? ?COMPLICATIONS: None ? ?CONDITION: Stable ? ?[name], MD ?04/22/2021 ?3:00 PM ? ? ?This note was created with Dragon Medical transcription system. Any errors in dictation are purely unintentional.  ?

## 2021-04-22 NOTE — Progress Notes (Signed)
OT Cancellation Note ? ?Patient Details ?Name: Blake West ?MRN: [id number] ?DOB: 07-05-1950 ? ? ?Cancelled Treatment:    Reason Eval/Treat Not Completed: Other (comment) (pt off floor for perma cath placement. OT will re-attempt as able.) ? ?[name], OTD OTR/L  ?04/22/21, 2:48 PM  ?

## 2021-04-22 NOTE — Progress Notes (Signed)
?[facility]  ?ROUNDING NOTE  ? ?Subjective:  ? 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  Patient presents to the emergency department with complaints of nausea, vomiting, generalized decline.  Patient has been admitted for Hyperkalemia [E87.5] ?Atrial fibrillation with RVR ([place]) [I48.91] ?Severe sepsis with acute organ dysfunction ([place]) [A41.9, R65.20] ?Acute renal failure, unspecified acute renal failure type ([place]) [N17.9] ? ? ?Update: ?Patient seen sitting up in chair, wife and 2 daughters at bedside ?Patient's appetite remains poor.  Continues to complain of nausea ?Remains on room air ?Foley remains in place ? ?Objective:  ?Vital signs in last 24 hours:  ?Temp:  [97.4 ?F (36.3 ?C)-98 ?F (36.7 ?C)] 97.4 ?F (36.3 ?C) (03/30 1403) ?Pulse Rate:  [0-119] 115 (03/30 1450) ?Resp:  [13-37] 20 (03/30 1450) ?BP: (102-161)/(66-102) 161/86 (03/30 1450) ?SpO2:  [86 %-100 %] 94 % (03/30 1450) ? ?Weight change:  ?Filed Weights  ? 04/19/21 1816 04/20/21 0100  ?Weight: 83 kg 86 kg  ? ? ?Intake/Output: ?I/O last 3 completed shifts: ?In: 1789.5 [P.O.:480; I.V.:1140.7; IV Piggyback:168.9] ?Out: 2650 [Urine:2650] ?  ?Intake/Output this shift: ? No intake/output data recorded. ? ?Physical Exam: ?General: NAD, sitting in chair  ?Head: Normocephalic, atraumatic.  Dry oral mucosal membranes  ?Eyes: Anicteric  ?Lungs:  Clear to auscultation, normal effort, room air  ?Heart: Regular rate and rhythm  ?Abdomen:  Soft, nontender, nondistended  ?Extremities: No peripheral edema.  ?Neurologic: Alert and oriented, moving all four extremities  ?Skin: No lesions  ?Access: None  ? ? ?Basic Metabolic Panel: ?Recent Labs  ?Lab 04/19/21 ?2110 04/20/21 ?9937 04/21/21 ?0434 04/22/21 ?0414  ?NA 135 139 139 139  ?K 6.8* 5.1 4.2 4.8  ?CL 103 106  103 102  ?CO2 8* 9* 14* 12*  ?GLUCOSE 116* 119* 101* 171*  ?BUN 247* 195* 220* 159*  ?CREATININE 15.05* 14.69* 13.87* 14.09*  ?CALCIUM 9.6 8.7* 8.1* 8.1*  ?MG 3.2* 2.9*  --  2.9*  ?PHOS  --  13.2*  --   --   ? ? ? ?Liver Function Tests: ?Recent Labs  ?Lab 04/19/21 ?2110 04/21/21 ?0434  ?AST 11* 12*  ?ALT 9 10  ?ALKPHOS 68 48  ?BILITOT 1.0 1.3*  ?PROT 8.1 6.2*  ?ALBUMIN 3.7 3.0*  ? ? ?Recent Labs  ?Lab 04/19/21 ?2110  ?LIPASE 88*  ? ? ?No results for input(s): AMMONIA in the last 168 hours. ? ?CBC: ?Recent Labs  ?Lab 04/19/21 ?2110 04/20/21 ?1696 04/21/21 ?0434 04/22/21 ?0414  ?WBC 15.7* 14.4* 13.1* 7.0  ?NEUTROABS 14.3* 11.4* 10.3*  --   ?HGB 13.3 10.6* 10.1* 10.0*  ?HCT 40.5 32.0* 29.6* 30.2*  ?MCV 86.9 87.0 84.3 84.8  ?PLT 267 221 205 197  ? ? ? ?Cardiac Enzymes: ?No results for input(s): CKTOTAL, CKMB, CKMBINDEX, TROPONINI in the last 168 hours. ? ?BNP: ?Invalid input(s): POCBNP ? ?CBG: ?Recent Labs  ?Lab 04/21/21 ?1654 04/21/21 ?2217 04/22/21 ?0755 04/22/21 ?1055 04/22/21 ?1405  ?GLUCAP 107* 122* 184* 176* 194*  ? ? ? ?Microbiology: ?Results for orders placed or performed during the hospital encounter of 04/19/21  ?Resp Panel by RT-PCR (Flu A&B, Covid) Nasopharyngeal Swab     Status: None  ? Collection Time: 04/19/21  9:14 PM  ? Specimen: Nasopharyngeal Swab; Nasopharyngeal(NP) swabs in vial transport medium  ?Result Value Ref Range Status  ? SARS Coronavirus 2 by RT PCR NEGATIVE  NEGATIVE Final  ?  Comment: (NOTE) ?SARS-CoV-2 target nucleic acids are NOT DETECTED. ? ?The SARS-CoV-2 RNA is generally detectable in upper respiratory ?specimens during the acute phase of infection. The lowest ?concentration of SARS-CoV-2 viral copies this assay can detect is ?138 copies/mL. A negative result does not preclude SARS-Cov-2 ?infection and should not be used as the sole basis for treatment or ?other patient management decisions. A negative result may occur with  ?improper specimen collection/handling, submission of  specimen other ?than nasopharyngeal swab, presence of viral mutation(s) within the ?areas targeted by this assay, and inadequate number of viral ?copies(<138 copies/mL). A negative result must be combined with ?clinical observations, patient history, and epidemiological ?information. The expected result is Negative. ? ?Fact Sheet for Patients:  ?EntrepreneurPulse.com.au ? ?Fact Sheet for Healthcare Providers:  ?IncredibleEmployment.be ? ?This test is no t yet approved or cleared by the [place] FDA and  ?has been authorized for detection and/or diagnosis of SARS-CoV-2 by ?FDA under an Emergency Use Authorization (EUA). This EUA will remain  ?in effect (meaning this test can be used) for the duration of the ?COVID-19 declaration under Section 564(b)(1) of the Act, 21 ?U.S.C.section 360bbb-3(b)(1), unless the authorization is terminated  ?or revoked sooner.  ? ? ?  ? Influenza A by PCR NEGATIVE NEGATIVE Final  ? Influenza B by PCR NEGATIVE NEGATIVE Final  ?  Comment: (NOTE) ?The Xpert Xpress SARS-CoV-2/FLU/RSV plus assay is intended as an aid ?in the diagnosis of influenza from Nasopharyngeal swab specimens and ?should not be used as a sole basis for treatment. Nasal washings and ?aspirates are unacceptable for Xpert Xpress SARS-CoV-2/FLU/RSV ?testing. ? ?Fact Sheet for Patients: ?EntrepreneurPulse.com.au ? ?Fact Sheet for Healthcare Providers: ?IncredibleEmployment.be ? ?This test is not yet approved or cleared by the [place] FDA and ?has been authorized for detection and/or diagnosis of SARS-CoV-2 by ?FDA under an Emergency Use Authorization (EUA). This EUA will remain ?in effect (meaning this test can be used) for the duration of the ?COVID-19 declaration under Section 564(b)(1) of the Act, 21 U.S.C. ?section 360bbb-3(b)(1), unless the authorization is terminated or ?revoked. ? ?Performed at [facility], [place], ?[place] [address] ?  ?Culture, blood (routine x 2)     Status: None (Preliminary result)  ? Collection Time: 04/19/21  9:45 PM  ? Specimen: BLOOD  ?Result Value Ref Range Status  ? Specimen Description BLOOD RIGHT ANTECUBITAL  Final  ? Special Requests   Final  ?  BOTTLES DRAWN AEROBIC AND ANAEROBIC Blood Culture adequate volume  ? Culture   Final  ?  NO GROWTH 3 DAYS ?Performed at [facility], [address] [place], [place] [address] ?  ? Report Status PENDING  Incomplete  ?Culture, blood (routine x 2)     Status: None (Preliminary result)  ? Collection Time: 04/19/21 10:32 PM  ? Specimen: BLOOD  ?Result Value Ref Range Status  ? Specimen Description BLOOD LEFT ASSIST CONTROL  Final  ? Special Requests   Final  ?  BOTTLES DRAWN AEROBIC AND ANAEROBIC Blood Culture adequate volume  ? Culture   Final  ?  NO GROWTH 3 DAYS ?Performed at [facility], [address], [place], [place] [address] ?  ? Report Status PENDING  Incomplete  ?MRSA Next Gen by PCR, Nasal     Status: None  ? Collection Time: 04/20/21  2:21 AM  ? Specimen: Nasal Mucosa; Nasal Swab  ?Result Value Ref Range Status  ? MRSA by  PCR Next Gen NOT DETECTED NOT DETECTED Final  ?  Comment: (NOTE) ?The GeneXpert MRSA Assay (FDA approved for NASAL specimens only), ?is one component of a comprehensive MRSA colonization surveillance ?program. It is not intended to diagnose MRSA infection nor to guide ?or monitor treatment for MRSA infections. ?Test performance is not FDA approved in patients less than 2 years ?old. ?Performed at [facility], [place], ?[place] [address] ?  ?Urine Culture     Status: None  ? Collection Time: 04/20/21  5:45 AM  ? Specimen: Urine, Clean Catch  ?Result Value Ref Range Status  ? Specimen Description   Final  ?  URINE, CLEAN CATCH ?Performed at [facility], [address] [place], [place] [address] ?  ? Special Requests   Final  ?  NONE ?Performed at [facility], [address] [place], [place] [address] ?  ? Culture   Final  ?  NO GROWTH ?Performed at [facility], [place] [address] [place], [place] [address] ?  ? Report Status 04/21/2021 FINAL  Final  ?

## 2021-04-22 NOTE — Progress Notes (Signed)
?  Progress Note ? ? ?Patient: Blake West TDD:[id number] DOB: 07/22/1950 DOA: 04/19/2021     3 ?DOS: the patient was seen and examined on 04/22/2021 ?  ?Brief hospital course: ?[name] is a 71 year old male with history of biventricular heart failure, hypertension, gout, chronic heart failure reduced ejection fraction, history of glomerulonephritis, CKD stage IV, who presents emergency department from outpatient clinic for chief concerns of renal failure.  Patient has been followed by nephrology in the office, recommended work-up for vasculitis, but patient has not been interested per Dr. Candiss Norse. ?Patient has been having coughing and shortness of breath for 2 weeks, nausea, vomiting and diarrhea for 1 week. ?Upon arriving the hospital, patient had a serum creatinine of 15, bicarb 8, potassium 6.8.  Chest CT scan showed scattered nodular groundglass airspace disease. ?Patient is treated with bicarb drip, Zithromax, vancomycin and cefepime. ? ? ? ?Assessment and Plan: ?* Acute renal failure superimposed on stage 4 chronic kidney disease ([place]) ?--Cr 15 on presentation, BUN 247. ?Patient had a chronic kidney disease stage IV, etiology still unclear.  Worsening renal function due to dehydration from diarrhea. ?Patient also has significant hyperkalemia and metabolic acidosis.   ?--there was also concern for vasculitis ?Plan: ?--dialysis cath placement today and start dialysis ?--vasculitis workup per nephro ?--plan on kidney biopsy early next week after uremia improves ? ?Malnutrition of moderate degree ?--supplements per dietician  ? ?High anion gap metabolic acidosis ?This is secondary to acute on chronic renal failure.  ?--cont Na bicarb@75  ? ?Chronic atrial fibrillation with RVR (HCC) ?--cont Toprol ?--Will consider anticoagulation after renal function improves. ? ?Multifocal pneumonia ?Mild elevation of procalcitonin level in the setting of acute renal failure.  MRSA culture negative, discontinued vancomycin.    ?--cont Rocephin and Zithromax. ? ?Hyperkalemia ?Potassium has normalized.  This is secondary to acute kidney injury as well as severe metabolic acidosis. ? ?Diarrhea ?Patient has been having diarrhea for 1 week, but none since presentation, so C diff and GI path could not be collected. ? ?Severe sepsis with acute organ dysfunction ([place]) ?Patient has severe sepsis with tachycardia, tachypnea, leukocytosis and a severe renal failure.  This is most likely due to pneumonia. ?He has received IV fluids.  ? ? ?Chronic HFrEF (heart failure with reduced ejection fraction) ([place]) ?Acute CHF ruled out. ?Reviewed echocardiogram performed/2021, ejection fraction 20 to 25%. ?Patient was volume depleted at time of admission, no exacerbation of congestive heart failure.  ?Patient was not on evidence-based congestive heart failure treatment. ?--cont metoprolol succinate at 50 mg daily to help control the heart rate (new) ? ?Hypothyroidism ?Continue Synthroid. ? ?Hyperlipidemia ?Not currently treated. ? ?Essential hypertension ?Blood pressure stable ?--cont Toprol ? ? ? ? ?  ? ?Subjective:  ?Dialysis cath placed today and pt received 1st dialysis session. ? ? ?Physical Exam: ? ?Constitutional: NAD, AAOx3 ?HEENT: conjunctivae and lids normal, EOMI ?CV: No cyanosis.   ?RESP: normal respiratory effort, on RA ?Extremities: No effusions, edema in BLE ?SKIN: warm, dry ?Neuro: II - XII grossly intact.   ? ? ?Data Reviewed: ? ?Family Communication:  ? ?Disposition: ?Status is: Inpatient ?Remains inpatient appropriate because: starting dialysis, pending kidney biopsy ? ? Planned Discharge Destination: Home ? ? ? ?Time spent: 35 minutes ? ?Author: ?Enzo Bi, MD ?04/22/2021 8:58 PM ? ?For on call review [email or web address].  ?

## 2021-04-22 NOTE — Care Management Important Message (Signed)
Important Message ? ?Patient Details  ?Name: Blake West ?MRN: [id number] ?Date of Birth: 1950-04-30 ? ? ?Medicare Important Message Given:  Yes ? ? ? ? ?Dannette Barbara ?04/22/2021, 11:14 AM ?

## 2021-04-22 NOTE — Interval H&P Note (Signed)
History and Physical Interval Note: ? ?04/22/2021 ?1:51 PM ? ?Blake West  has presented today for surgery, with the diagnosis of ESRD.  The various methods of treatment have been discussed with the patient and family. After consideration of risks, benefits and other options for treatment, the patient has consented to  Procedure(s): ?DIALYSIS/PERMA CATHETER INSERTION (N/A) as a surgical intervention.  The patient's history has been reviewed, patient examined, no change in status, stable for surgery.  I have reviewed the patient's chart and labs.  Questions were answered to the patient's satisfaction.   ? ? ?[name] ? ? ?

## 2021-04-22 NOTE — Progress Notes (Signed)
Was told by transport upon arrival of pt to [facility] that wife states nothing was to be discussed with patient's daughters. Patient is alert and oriented. Asked patient if it was ok to discuss anything medical with wife and daughters. Patient stated that it was ok to discuss anything with his daughters and wife.  ?

## 2021-04-23 ENCOUNTER — Inpatient Hospital Stay: Payer: PPO

## 2021-04-23 DIAGNOSIS — I7782 Antineutrophilic cytoplasmic antibody (ANCA) vasculitis: Secondary | ICD-10-CM | POA: Diagnosis present

## 2021-04-23 DIAGNOSIS — N170 Acute kidney failure with tubular necrosis: Secondary | ICD-10-CM | POA: Diagnosis not present

## 2021-04-23 DIAGNOSIS — N186 End stage renal disease: Secondary | ICD-10-CM

## 2021-04-23 DIAGNOSIS — N184 Chronic kidney disease, stage 4 (severe): Secondary | ICD-10-CM | POA: Diagnosis not present

## 2021-04-23 DIAGNOSIS — J9601 Acute respiratory failure with hypoxia: Secondary | ICD-10-CM

## 2021-04-23 HISTORY — DX: Acute respiratory failure with hypoxia: J96.01

## 2021-04-23 LAB — ANCA TITERS
Atypical P-ANCA titer: <1:20 {titer}
C-ANCA: <1:20 {titer}
P-ANCA: 1:320 {titer} — ABNORMAL HIGH

## 2021-04-23 LAB — CBC
HCT: 29 % — ABNORMAL LOW (ref 39.0–52.0)
Hemoglobin: 10 g/dL — ABNORMAL LOW (ref 13.0–17.0)
MCH: 28.9 pg (ref 26.0–34.0)
MCHC: 34.5 g/dL (ref 30.0–36.0)
MCV: 83.8 fL (ref 80.0–100.0)
Platelets: 187 10*3/uL (ref 150–400)
RBC: 3.46 MIL/uL — ABNORMAL LOW (ref 4.22–5.81)
RDW: 14.3 % (ref 11.5–15.5)
WBC: 12.2 10*3/uL — ABNORMAL HIGH (ref 4.0–10.5)
nRBC: 0 % (ref 0.0–0.2)

## 2021-04-23 LAB — BASIC METABOLIC PANEL
Anion gap: 19 — ABNORMAL HIGH (ref 5–15)
BUN: 156 mg/dL — ABNORMAL HIGH (ref 8–23)
CO2: 24 mmol/L (ref 22–32)
Calcium: 8.1 mg/dL — ABNORMAL LOW (ref 8.9–10.3)
Chloride: 96 mmol/L — ABNORMAL LOW (ref 98–111)
Creatinine, Ser: 10.24 mg/dL — ABNORMAL HIGH (ref 0.61–1.24)
GFR, Estimated: 5 mL/min — ABNORMAL LOW (ref >60)
Glucose, Bld: 200 mg/dL — ABNORMAL HIGH (ref 70–99)
Potassium: 3.6 mmol/L (ref 3.5–5.1)
Sodium: 139 mmol/L (ref 135–145)

## 2021-04-23 LAB — GLUCOSE, CAPILLARY
Glucose-Capillary: 163 mg/dL — ABNORMAL HIGH (ref 70–99)
Glucose-Capillary: 151 mg/dL — ABNORMAL HIGH (ref 70–99)
Glucose-Capillary: 155 mg/dL — ABNORMAL HIGH (ref 70–99)

## 2021-04-23 LAB — MAGNESIUM: Magnesium: 2.6 mg/dL — ABNORMAL HIGH (ref 1.7–2.4)

## 2021-04-23 MED ORDER — HEPARIN SODIUM (PORCINE) 5000 UNIT/ML IJ SOLN
5000.0000 [IU] | Freq: Three times a day (TID) | INTRAMUSCULAR | Status: AC
  Administered 2021-04-23 – 2021-04-24 (×4): 5000 [IU] via SUBCUTANEOUS
  Filled 2021-04-23 (×4): qty 1

## 2021-04-23 MED ORDER — PROCHLORPERAZINE EDISYLATE 10 MG/2ML IJ SOLN
10.0000 mg | Freq: Once | INTRAMUSCULAR | Status: AC
  Administered 2021-04-23: 10 mg via INTRAVENOUS
  Filled 2021-04-23: qty 2

## 2021-04-23 MED ORDER — PROSOURCE PLUS PO LIQD
30.0000 mL | Freq: Three times a day (TID) | ORAL | Status: DC
  Administered 2021-04-24 – 2021-04-26 (×5): 30 mL via ORAL
  Filled 2021-04-23 (×16): qty 30

## 2021-04-23 MED ORDER — DILTIAZEM HCL 25 MG/5ML IV SOLN
10.0000 mg | Freq: Once | INTRAVENOUS | Status: AC
  Administered 2021-04-23: 10 mg via INTRAVENOUS
  Filled 2021-04-23: qty 5

## 2021-04-23 MED ORDER — HEPARIN SODIUM (PORCINE) 1000 UNIT/ML IJ SOLN
INTRAMUSCULAR | Status: AC
  Filled 2021-04-23: qty 10

## 2021-04-23 MED ORDER — RENA-VITE PO TABS
1.0000 | ORAL_TABLET | Freq: Every day | ORAL | Status: DC
  Administered 2021-04-23 – 2021-04-29 (×7): 1 via ORAL
  Filled 2021-04-23 (×7): qty 1

## 2021-04-23 MED ORDER — DILTIAZEM HCL 60 MG PO TABS
60.0000 mg | ORAL_TABLET | Freq: Four times a day (QID) | ORAL | Status: DC
  Administered 2021-04-23 – 2021-04-26 (×11): 60 mg via ORAL
  Filled 2021-04-23 (×13): qty 1

## 2021-04-23 MED ORDER — PROCHLORPERAZINE EDISYLATE 10 MG/2ML IJ SOLN
10.0000 mg | Freq: Four times a day (QID) | INTRAMUSCULAR | Status: DC | PRN
  Administered 2021-04-23: 10 mg via INTRAVENOUS
  Filled 2021-04-23: qty 2

## 2021-04-23 NOTE — Progress Notes (Signed)
Nutrition Follow-up ? ?DOCUMENTATION CODES:  ? ?Non-severe (moderate) malnutrition in context of chronic illness ? ?INTERVENTION:  ? ?-D/c Nepro ?-D/c MVI ?-Renal MVI daily ?-30 ml Prosource Plus TID, each supplement provides 100 kcals and 15 grams protein ?-Liberalize diet to regular for widest variety of meal selections ? ?NUTRITION DIAGNOSIS:  ? ?Moderate Malnutrition related to chronic illness (CHF, CKD IV) as evidenced by mild fat depletion, moderate muscle depletion, energy intake < or equal to 75% for > or equal to 1 month. ? ?Ongoing ? ?GOAL:  ? ?Patient will meet greater than or equal to 90% of their needs ? ?Progressing  ? ?MONITOR:  ? ?PO intake, Supplement acceptance, Diet advancement, Labs, Weight trends, I & O's ? ?REASON FOR ASSESSMENT:  ? ?Malnutrition Screening Tool ?  ? ?ASSESSMENT:  ? ?Pt admitted from outpatient PCP office with nausea, vomiting, poor PO intake and diarrhea x4 days secondary to renal failure. PMH significant for biventricular heart failure, HTN, gout, CHF, glomerulonephritis and CKD stage IV. ? ?3/30- s/p HD cath placement, first HD treatment ? ?Reviewed I/O's: +507 ml x 24 hours and +783 ml since admission ? ?UOP: 1 L x 24 hours  ? ?Plan for renal biopsy on Monday, 04/26/21.  ? ?RD saw pt at request of nutritional services department.  ? ?Pt sleeping at time of visit. Pt just returned form HD. Spoke with pt wife at bedside, who reports concern about poor oral intake. She shares that pt has had consistent nausea and vomiting, but this has improved today. Pt with multiple food intolerances, including cooking oil. She expresses concern over diet restrictions of renal diet. Will liberalize diet to better meet pt's needs. Noted meal completions 0%. Pt is refusing Nepro supplements.  ? ?Labs reviewed: CBGS: 163-195 (inpatient orders for glycemic control are 0-5 units insulin aspart daily and 0-9 units insulin aspart TID with meals).   ? ?Diet Order:   ?Diet Order   ? ?       ?  Diet  regular Room service appropriate? Yes; Fluid consistency: Thin; Fluid restriction: 1200 mL Fluid  Diet effective now       ?  ? ?  ?  ? ?  ? ? ?EDUCATION NEEDS:  ? ?Education needs have been addressed ? ?Skin:  Skin Assessment: Reviewed RN Assessment ? ?Last BM:  04/22/21 ? ?Height:  ? ?Ht Readings from Last 1 Encounters:  ?04/20/21 5\' 11"  (1.803 m)  ? ? ?Weight:  ? ?Wt Readings from Last 1 Encounters:  ?04/23/21 84.1 kg  ? ?BMI:  Body mass index is 25.86 kg/m?. ? ?Estimated Nutritional Needs:  ? ?Kcal:  2150-2350 ? ?Protein:  115-130 grams ? ?Fluid:  1000 ml +UOP ? ? ? ?Loistine Chance, RD, LDN, CDCES ?Registered Dietitian II ?Certified Diabetes Care and Education Specialist ?Please refer to [facility] for RD and/or RD on-call/weekend/after hours pager  ?

## 2021-04-23 NOTE — Progress Notes (Signed)
Physical Therapy Treatment ?Patient Details ?Name: Blake West ?MRN: [id number] ?DOB: February 05, 1950 ?Today's Date: 04/23/2021 ? ? ?History of Present Illness 71 year old male with history of biventricular heart failure, hypertension, gout, chronic heart failure reduced ejection fraction, history of glomerulonephritis, CKD stage IV, who presents emergency department from outpatient clinic for chief concerns of renal failure.  Patient has been having coughing and shortness of breath for 2 weeks, nausea, vomiting and diarrhea for 1 week. Pt admitted for severe sepsis with acute organ dysfunction most likely due to pneumonia ? ?  ?PT Comments  ? ? Patient tolerated session fairly and was agreeable to treatment. Upon arrival patient was in L sidelying resting, and awoke to tactile rub on arm and to his name. Patient initially required Max encouragement from wife and Pryor Curia to participate, however finally agreed. Due to patient's fatigue from Dialysis patient required Min-Mod A for bed mobility, and Min A for sit to stand transfers from EOB and BSC. Max A required for Pericare. Patient was able to demonstrate ~8 steps to/from [facility] with RW, and ~3 left lateral side steps with B HHA up towards HOB. Patient left in bed with all needs met with family present. Patient is progressing towards his goals and would continue to benefit from skilled physical therapy in order to optimize patient's return to PLOF. Continue to recommend STR upon discharge from acute hospitalization. ?   ?Recommendations for follow up therapy are one component of a multi-disciplinary discharge planning process, led by the attending physician.  Recommendations may be updated based on patient status, additional functional criteria and insurance authorization. ? ?Follow Up Recommendations ? Skilled nursing-short term rehab (<3 hours/day) ?  ?  ?Assistance Recommended at Discharge Frequent or constant Supervision/Assistance  ?Patient can return home with the  following A little help with walking and/or transfers;Help with stairs or ramp for entrance;Assist for transportation;A little help with bathing/dressing/bathroom ?  ?Equipment Recommendations ? Other (comment) (TBD at next level of care)  ?  ?Recommendations for Other Services   ? ? ?  ?Precautions / Restrictions Precautions ?Precautions: Fall ?Restrictions ?Weight Bearing Restrictions: No  ?  ? ?Mobility ? Bed Mobility ?Overal bed mobility: Needs Assistance ?Bed Mobility: Supine to Sit, Sit to Supine ?  ?  ?Supine to sit: Mod assist (with Max encouragment from wife and Chief Strategy Officer) ?Sit to supine: Min assist, HOB elevated ?  ?  ?Patient Response: Flat affect ? ?Transfers ?Overall transfer level: Needs assistance ?Equipment used: Rolling walker (2 wheels) ?Transfers: Sit to/from Stand ?Sit to Stand: Min assist ?  ?  ?  ?  ?  ?General transfer comment: x2 from EOB, x1 from [facility] ?  ? ?Ambulation/Gait ?Ambulation/Gait assistance: Min guard, Min assist ?Gait Distance (Feet): 5 Feet ?Assistive device: Rolling walker (2 wheels) ?Gait Pattern/deviations: Decreased step length - right, Decreased step length - left, Decreased stride length, Trunk flexed, Narrow base of support ?Gait velocity: decreased ?  ?  ?General Gait Details: mild unsteadiness noted, patient was able to left lateral side step from B HHA up to [facility] ? ? ?Stairs ?  ?  ?  ?  ?  ? ? ?Wheelchair Mobility ?  ? ?Modified Rankin (Stroke Patients Only) ?  ? ? ?  ?Balance Overall balance assessment: Needs assistance ?Sitting-balance support: Feet supported, Bilateral upper extremity supported ?Sitting balance-Leahy Scale: Fair ?Sitting balance - Comments: reported nausea sitting EOB, forward flexed posture ?  ?Standing balance support: Bilateral upper extremity supported, During functional activity, Reliant on assistive device  for balance ?Standing balance-Leahy Scale: Fair ?Standing balance comment: requires CGA to maintain standing balance, increased forward flexion  in static standing with RW ?  ?  ?  ?  ?  ?  ?  ?  ?  ?  ?  ?  ? ?  ?Cognition Arousal/Alertness: Awake/alert, Lethargic ?Behavior During Therapy: [facility] for tasks assessed/performed ?Overall Cognitive Status: Within Functional Limits for tasks assessed ?  ?  ?  ?  ?  ?  ?  ?  ?  ?  ?  ?  ?  ?  ?  ?  ?General Comments: patient's main communication this session was grunting ?  ?  ? ?  ?Exercises   ? ?  ?General Comments General comments (skin integrity, edema, etc.): HR ranged from 104-131bpm throughout session ?  ?  ? ?Pertinent Vitals/Pain Pain Assessment ?Faces Pain Scale: Hurts a little bit ?Pain Location: patient unable to state ?Pain Intervention(s): Limited activity within patient's tolerance, Monitored during session, Repositioned  ? ? ?Home Living   ?  ?  ?  ?  ?  ?  ?  ?  ?  ?   ?  ?Prior Function    ?  ?  ?   ? ?PT Goals (current goals can now be found in the care plan section) Acute Rehab PT Goals ?Patient Stated Goal: to get better ?PT Goal Formulation: With patient ?Time For Goal Achievement: 05/05/21 ?Potential to Achieve Goals: Good ?Progress towards PT goals: Progressing toward goals ? ?  ?Frequency ? ? ? Min 2X/week ? ? ? ?  ?PT Plan Current plan remains appropriate  ? ? ?Co-evaluation   ?  ?  ?  ?  ? ?  ?AM-PAC PT "6 Clicks" Mobility   ?Outcome Measure ? Help needed turning from your back to your side while in a flat bed without using bedrails?: A Little ?Help needed moving from lying on your back to sitting on the side of a flat bed without using bedrails?: A Little ?Help needed moving to and from a bed to a chair (including a wheelchair)?: A Little ?Help needed standing up from a chair using your arms (e.g., wheelchair or bedside chair)?: A Little ?Help needed to walk in hospital room?: A Lot ?Help needed climbing 3-5 steps with a railing? : A Lot ?6 Click Score: 16 ? ?  ?End of Session Equipment Utilized During Treatment: Gait belt ?Activity Tolerance: Patient tolerated treatment well;Patient  limited by fatigue ?Patient left: in bed;with call bell/phone within reach;with family/visitor present ?Nurse Communication: Mobility status ?PT Visit Diagnosis: Unsteadiness on feet (R26.81);Muscle weakness (generalized) (M62.81);Difficulty in walking, not elsewhere classified (R26.2);Dizziness and giddiness (R42);Pain ?  ? ? ?Time: 5929-2446 ?PT Time Calculation (min) (ACUTE ONLY): 26 min ? ?Charges:  $Therapeutic Activity: 23-37 mins          ?          ? ?Iva Boop, PT  ?04/23/21. 3:14 PM ? ? ?

## 2021-04-23 NOTE — Progress Notes (Signed)
PT Cancellation Note ? ?Patient Details ?Name: Blake West ?MRN: [id number] ?DOB: [birth year] ? ? ?Cancelled Treatment:    Reason Eval/Treat Not Completed: Patient at procedure or test/unavailable Patient's chart reviewed. Due to patient being off the floor at dialysis, will hold on PT at this time, and will re-attempt at a later time/date as available and patient medically appropriate for PT. Thank you! ? ? ?Iva Boop, PT  ?04/23/21. 10:12 AM ? ?

## 2021-04-23 NOTE — Progress Notes (Signed)
Per dr. Billie Ruddy.  Pt's foley should remain in place until 2April.  Pt would benefit from decompression until then, then remove it. ?

## 2021-04-23 NOTE — H&P (Addendum)
Chief Complaint: Patient was seen in consultation today for random renal biopsy  at the request of Mosetta Pigeon  Referring Physician(s): Mosetta Pigeon  Supervising Physician: Gilmer Mor  Patient Status: [facility] - In-pt  History of Present Illness: Blake West is a 71 y.o. male w/ PMH of HTN, HFrEF, CKD stage IV w/ glomerulonephritis. Pt presented to ED 04/19/21 c/o N/V and general decline. Pt was admitted for hyperkalemia, A-fib w/ RVR and ARF. Pt had TD catheter placed 3/30 to start HD. Due to the unknown etiology of pt CKD, Dr. Rexene Edison. Thedore Mins has referred pt to IR for random renal biopsy with moderate sedation. Procedure is tentatively planned for 04/26/21.  Past Medical History:  Diagnosis Date   Abnormal EKG    Biventricular failure (HCC)    CAD (coronary artery disease)    Colorectal polyps    Gout    Hyperlipidemia LDL goal <70    Hypertension    Hypothyroidism    Kidney stone    Pneumonia    Testicular hypofunction    Type 2 diabetes mellitus (HCC) 04/26/2019    Past Surgical History:  Procedure Laterality Date   CARDIAC CATHETERIZATION     DIALYSIS/PERMA CATHETER INSERTION N/A 04/22/2021   Procedure: DIALYSIS/PERMA CATHETER INSERTION;  Surgeon: Annice Needy, MD;  Location: [facility] INVASIVE CV LAB;  Service: Cardiovascular;  Laterality: N/A;   FOOT SURGERY     RIGHT/LEFT HEART CATH AND CORONARY ANGIOGRAPHY N/A 04/30/2019   Procedure: RIGHT/LEFT HEART CATH AND CORONARY ANGIOGRAPHY;  Surgeon: Lennette Bihari, MD;  Location: [facility] INVASIVE [facility];  Service: Cardiovascular;  Laterality: N/A;    Allergies: Codeine  Medications: Prior to Admission medications   Medication Sig Start Date End Date Taking? Authorizing Provider  Ascorbic Acid (VITAMIN C) 100 MG tablet Take 100 mg by mouth daily.   Yes [provider]  calcium-vitamin D (OSCAL WITH D) 500-5 MG-MCG tablet Take 1 tablet by mouth.   Yes [provider]  levofloxacin (LEVAQUIN) 750 MG tablet Take 750  mg by mouth daily. 04/19/21 04/25/21 Yes [provider]  levothyroxine (SYNTHROID) 25 MCG tablet Take 25 mcg by mouth every morning. 01/14/21  Yes [provider]  magnesium 30 MG tablet Take 30 mg by mouth 2 (two) times daily.   Yes [provider]  omega-3 acid ethyl esters (LOVAZA) 1 g capsule Take by mouth 2 (two) times daily.   Yes [provider]  thiamine 250 MG tablet Take 500 mg by mouth daily.   Yes [provider]  ondansetron (ZOFRAN) 4 MG tablet Take 4 mg by mouth every 8 (eight) hours as needed. Patient not taking: Reported on 04/20/2021 04/19/21 04/26/21  [provider]     Family History  Problem Relation Age of Onset   Heart attack Mother 42   Pulmonary fibrosis Father    Congestive Heart Failure Brother    Heart attack Brother    Heart attack Brother    Early death Sister    Kidney disease Paternal Grandfather    Diabetes Sister     Social History   Socioeconomic History   Marital status: Married    Spouse name: Not on file   Number of children: Not on file   Years of education: Not on file   Highest education level: Not on file  Occupational History   Occupation: Radio broadcast assistant  Tobacco Use   Smoking status: Former    Packs/day: 1.00    Years: 30.00  Pack years: 30.00    Types: Cigarettes    Quit date: 01/24/2002    Years since quitting: 19.2   Smokeless tobacco: Never  Vaping Use   Vaping Use: Never used  Substance and Sexual Activity   Alcohol use: No   Drug use: No   Sexual activity: Not on file  Other Topics Concern   Not on file  Social History Narrative   Not on file   Social Determinants of Health   Financial Resource Strain: Not on file  Food Insecurity: Not on file  Transportation Needs: Not on file  Physical Activity: Not on file  Stress: Not on file  Social Connections: Not on file     Review of Systems: A 12 point ROS discussed and pertinent positives are indicated in  the HPI above.  All other systems are negative.  Review of Systems  Constitutional:  Positive for appetite change and fatigue. Negative for chills and fever.  Respiratory:  Positive for shortness of breath.   Cardiovascular:  Positive for leg swelling. Negative for chest pain.  Gastrointestinal:  Positive for nausea. Negative for abdominal pain and vomiting.  Neurological:  Positive for tremors, weakness and headaches.   Vital Signs: BP (!) 140/101   Pulse 98   Temp 98.5 F (36.9 C)   Resp 20   Ht 5\' 11"  (1.803 m)   Wt 185 lb 6.5 oz (84.1 kg)   SpO2 94%   BMI 25.86 kg/m   Physical Exam Constitutional:      Appearance: Normal appearance. He is ill-appearing.  HENT:     Head: Normocephalic and atraumatic.     Mouth/Throat:     Mouth: Mucous membranes are dry.     Pharynx: Oropharynx is clear.  Eyes:     Extraocular Movements: Extraocular movements intact.     Pupils: Pupils are equal, round, and reactive to light.  Cardiovascular:     Rate and Rhythm: Normal rate and regular rhythm.     Pulses: Normal pulses.     Heart sounds: Normal heart sounds.  Pulmonary:     Effort: Pulmonary effort is normal. No respiratory distress.     Breath sounds: Normal breath sounds.  Abdominal:     General: Abdomen is flat. Bowel sounds are normal. There is no distension.     Palpations: Abdomen is soft.     Tenderness: There is no abdominal tenderness. There is no guarding.  Musculoskeletal:     Right lower leg: No edema.     Left lower leg: No edema.  Skin:    General: Skin is warm and dry.  Neurological:     Mental Status: He is alert and oriented to person, place, and time.  Psychiatric:        Mood and Affect: Mood normal.        Thought Content: Thought content normal.        Judgment: Judgment normal.    Imaging: CT Abdomen Pelvis Wo Contrast  Result Date: 04/19/2021 CLINICAL DATA:  Cough and congestion for several weeks, nausea/vomiting/diarrhea, weakness EXAM: CT ABDOMEN  AND PELVIS WITHOUT CONTRAST TECHNIQUE: Multidetector CT imaging of the abdomen and pelvis was performed following the standard protocol without IV contrast. Unenhanced CT was performed per clinician order. Lack of IV contrast limits sensitivity and specificity, especially for evaluation of abdominal/pelvic solid viscera. RADIATION DOSE REDUCTION: This exam was performed according to the departmental dose-optimization program which includes automated exposure control, adjustment of the mA and/or kV according to  patient size and/or use of iterative reconstruction technique. COMPARISON:  None. FINDINGS: Lower chest: There is bibasilar bronchial wall thickening, with scattered bilateral nodular ground-glass airspace disease. Overall, findings are consistent with multifocal bronchopneumonia. Atypical infection could be considered given the pattern of airspace disease. Hepatobiliary: No focal liver abnormality is seen. No gallstones, gallbladder wall thickening, or biliary dilatation. Pancreas: Unremarkable. No pancreatic ductal dilatation or surrounding inflammatory changes. Spleen: Unremarkable unenhanced appearance. Adrenals/Urinary Tract: No urinary tract calculi or obstructive uropathy within either kidney. The adrenals are unremarkable. Bladder is grossly normal. Stomach/Bowel: No bowel obstruction or ileus. Normal appendix right lower quadrant. Diverticulosis of the descending and sigmoid colon without diverticulitis. No bowel wall thickening or inflammatory change. Vascular/Lymphatic: Aortic atherosclerosis. No enlarged abdominal or pelvic lymph nodes. Reproductive: Prostate is enlarged measuring 5.3 by 3.9 by 4.7 cm. Other: No free fluid or free intraperitoneal gas. Small fat containing umbilical hernia. No bowel herniation. Musculoskeletal: No acute or destructive bony lesions. Reconstructed images demonstrate no additional findings. IMPRESSION: 1. Bibasilar bronchial wall thickening with scattered nodular  ground-glass airspace disease. Overall, findings are consistent with multifocal bronchopneumonia. Atypical infection could be considered given the pattern of airspace disease. 2. Distal colonic diverticulosis without diverticulitis. 3. Enlarged prostate. 4.  Aortic Atherosclerosis (ICD10-I70.0). Electronically Signed   By: Sharlet Salina M.D.   On: 04/19/2021 22:05   DG Chest 2 View  Result Date: 04/19/2021 CLINICAL DATA:  Cough and congestion for several weeks, nausea/vomiting/diarrhea EXAM: CHEST - 2 VIEW COMPARISON:  11/08/2019 FINDINGS: Frontal and lateral views of the chest demonstrate a stable cardiac silhouette. No acute airspace disease, effusion, or pneumothorax. Stable scarring within the right mid and bilateral lower lung zones. No acute bony abnormalities. IMPRESSION: 1. Stable chest, no acute process. Electronically Signed   By: Sharlet Salina M.D.   On: 04/19/2021 21:53   PERIPHERAL VASCULAR CATHETERIZATION  Result Date: 04/22/2021 See surgical note for result.   Labs:  CBC: Recent Labs    04/20/21 0644 04/21/21 0434 04/22/21 0414 04/23/21 0419  WBC 14.4* 13.1* 7.0 12.2*  HGB 10.6* 10.1* 10.0* 10.0*  HCT 32.0* 29.6* 30.2* 29.0*  PLT 221 205 197 187    COAGS: Recent Labs    04/20/21 0644  INR 1.2    BMP: Recent Labs    04/20/21 0644 04/21/21 0434 04/22/21 0414 04/23/21 0419  NA 139 139 139 139  K 5.1 4.2 4.8 3.6  CL 106 103 102 96*  CO2 9* 14* 12* 24  GLUCOSE 119* 101* 171* 200*  BUN 195* 220* 159* 156*  CALCIUM 8.7* 8.1* 8.1* 8.1*  CREATININE 14.69* 13.87* 14.09* 10.24*  GFRNONAA 3* 3* 3* 5*    LIVER FUNCTION TESTS: Recent Labs    04/19/21 2110 04/21/21 0434  BILITOT 1.0 1.3*  AST 11* 12*  ALT 9 10  ALKPHOS 68 48  PROT 8.1 6.2*  ALBUMIN 3.7 3.0*    TUMOR MARKERS: No results for input(s): AFPTM, CEA, CA199, CHROMGRNA in the last 8760 hours.  Assessment and Plan: History of HTN, HFrEF, CKD stage IV w/ glomerulonephritis. Pt presented to  ED 04/19/21 c/o N/V and general decline. Pt was admitted for hyperkalemia, A-fib w/ RVR and ARF. Pt had TD catheter placed 3/30 to start HD. Due to the unknown etiology of pt CKD, Dr. Rexene Edison. Thedore Mins has referred pt to IR for random renal biopsy with moderate sedation. Procedure is tentatively planned for 04/26/21.   Pt resting in bed holding emesis bag. Pt wife and daughter at bedside. Pt  reports nausea with no emesis.  Pt is A&O, somnolent and tremulous which his wife states is how he reacts to HD. He is in no distress.   Orders placed to hold heparin 04/25/21.  NPO order placed.   Risks and benefits of random renal biopsy was discussed with the patient and/or patient's family including, but not limited to bleeding, infection, damage to adjacent structures or low yield requiring additional tests.  All of the questions were answered and there is agreement to proceed.  Consent signed and in chart.   Thank you for this interesting consult.  I greatly enjoyed meeting AYDN BOLIVAR and look forward to participating in their care.  A copy of this report was sent to the requesting provider on this date.  Electronically Signed: Shon Hough, NP 04/23/2021, 2:50 PM   I spent a total of 20 minutes in face to face in clinical consultation, greater than 50% of which was counseling/coordinating care for random renal biopsy with moderate sedation.

## 2021-04-23 NOTE — Progress Notes (Signed)
? ? ? ?PULMONOLOGY ? ? ? ? ? ? ? ? ?Date: 04/23/2021,   ?MRN# [id number] KERNEY HOPFENSPERGER 1950-10-14 ? ? ?  ?AdmissionWeight: 83 kg                 ?CurrentWeight: 84.1 kg ? ?Referring provider: Dr Candiss Norse ? ? ?CHIEF COMPLAINT:  ? ?Anca + vasculitis  ? ? ?HISTORY OF PRESENT ILLNESS  ? ?This is a pleasant 71 year old male with a history of CHF with reduced EF, coronary disease gout, dyslipidemia essential hypertension, hypothyroidism testicular dysfunction, type 2 diabetes, history of glomerulonephritis with CKD stage IV came in from outpatient clinic due to provider concerns for ongoing progressive renal failure.  Patient is essentially a lifelong non-smoker reporting smoking in the 1950s.  He does report mild and subacute and chronic cough with expectoration of whitish phlegm.  Patient reports decreased appetite as well as loose stools for less than 1 week prior to arrival.  In the emergency department patient had accelerated hypertension with tachycardia however normoxia on room air.  His initial creatinine was 15 with a GFR of 3 remainder of blood work was essentially unremarkable besides electrolyte derangements including potassium 6.8 without EKG changes.  He was negative for COVID influenza a and B on arrival.  He had chest imaging done without contrast which showed nodular groundglass airspace opacification of the lung.  There was concern for pneumonia and patient is empirically treated with cefepime and vancomycin and Zithromax on arrival.  Also received Lokelma for elevated potassium.  Spinal this patient was lucid without altered mentation.  CT imaging with lung windows were independently reviewed by me with findings of chronic bronchitic changes as well as punctate multifocal bilateral groundglass nodules these findings are nonspecific and can be seen with atypical infection, viral lower respiratory tract infection as well as inflammatory lung disease. ? ? ?04/23/21- patient resting in bed with family at bedside  during my evaluation. Patient is in no distress with nasal canula off his face normoxic on room air. He ate today with better appetite and was able to get up OOB to bathroom.  Family shares he seems to be feeling better overall. Wife shares the right clavicle oppposite side of trialysis catheter seems to appear different then usual and requests imaging to be done.  I have ordered CXR.  Noted CRP with moderate elevation. Infectious workup in progress , blood work reviewed with improved GFR post HD, mild hyperglycemia and leukocytosis post pulse dose steriods, no overnight events. Appreciate everyone caring for this patient. No changes to therapy from pulm perspective.  ? ?PAST MEDICAL HISTORY  ? ?Past Medical History:  ?Diagnosis Date  ? Abnormal EKG   ? Biventricular failure ([place])   ? CAD (coronary artery disease)   ? Colorectal polyps   ? Gout   ? Hyperlipidemia LDL goal <70   ? Hypertension   ? Hypothyroidism   ? Kidney stone   ? Pneumonia   ? Testicular hypofunction   ? Type 2 diabetes mellitus ([place]) 04/26/2019  ? ? ? ?SURGICAL HISTORY  ? ?Past Surgical History:  ?Procedure Laterality Date  ? CARDIAC CATHETERIZATION    ? DIALYSIS/PERMA CATHETER INSERTION N/A 04/22/2021  ? Procedure: DIALYSIS/PERMA CATHETER INSERTION;  Surgeon: Algernon Huxley, MD;  Location: [place] [facility];  Service: Cardiovascular;  Laterality: N/A;  ? FOOT SURGERY    ? RIGHT/LEFT HEART CATH AND CORONARY ANGIOGRAPHY N/A 04/30/2019  ? Procedure: RIGHT/LEFT HEART CATH AND CORONARY ANGIOGRAPHY;  Surgeon: Shelva Majestic  A, MD;  Location: [place] [facility];  Service: Cardiovascular;  Laterality: N/A;  ? ? ? ?FAMILY HISTORY  ? ?Family History  ?Problem Relation Age of Onset  ? Heart attack Mother 67  ? Pulmonary fibrosis Father   ? Congestive Heart Failure Brother   ? Heart attack Brother   ? Heart attack Brother   ? Early death Sister   ? Kidney disease Paternal Grandfather   ? Diabetes Sister   ? ? ? ?SOCIAL HISTORY  ? ?Social History  ? ?Tobacco Use   ? Smoking status: Former  ?  Packs/day: 1.00  ?  Years: 30.00  ?  Pack years: 30.00  ?  Types: Cigarettes  ?  Quit date: 01/24/2002  ?  Years since quitting: 19.2  ? Smokeless tobacco: Never  ?Vaping Use  ? Vaping Use: Never used  ?Substance Use Topics  ? Alcohol use: No  ? Drug use: No  ? ? ? ?MEDICATIONS  ? ? ?Home Medication:  ?  ?Current Medication: ? ?Current Facility-Administered Medications:  ?  (feeding supplement) PROSource Plus liquid 30 mL, 30 mL, Oral, TID BM, Enzo Bi, MD ?  0.9 %  sodium chloride infusion, 100 mL, Intravenous, PRN, Gwyneth Revels, Shantelle, NP ?  0.9 %  sodium chloride infusion, 100 mL, Intravenous, PRN, Colon Flattery, NP ?  acetaminophen (TYLENOL) tablet 650 mg, 650 mg, Oral, Q6H PRN, 650 mg at 04/21/21 2007 **OR** acetaminophen (TYLENOL) suppository 650 mg, 650 mg, Rectal, Q6H PRN, Cox, Amy N, DO ?  acidophilus (RISAQUAD) capsule 2 capsule, 2 capsule, Oral, TID, Enzo Bi, MD, 2 capsule at 04/23/21 1512 ?  albuterol (PROVENTIL) (2.5 MG/3ML) 0.083% nebulizer solution 2.5 mg, 2.5 mg, Nebulization, Q6H PRN, Cox, Amy N, DO ?  alteplase (CATHFLO ACTIVASE) injection 2 mg, 2 mg, Intracatheter, Once PRN, Colon Flattery, NP ?  alum & mag hydroxide-simeth (MAALOX/MYLANTA) 200-200-20 MG/5ML suspension 15 mL, 15 mL, Oral, Q6H PRN, Enzo Bi, MD, 15 mL at 04/23/21 1512 ?  azithromycin (ZITHROMAX) tablet 500 mg, 500 mg, Oral, QHS, Dolan, Carissa E, RPH ?  budesonide (PULMICORT) nebulizer solution 0.5 mg, 0.5 mg, Nebulization, Q6H PRN, Cox, Amy N, DO ?  calcium carbonate (TUMS - dosed in mg elemental calcium) chewable tablet 200 mg of elemental calcium, 1 tablet, Oral, TID PRN, Enzo Bi, MD ?  cefTRIAXone (ROCEPHIN) 2 g in sodium chloride 0.9 % 100 mL IVPB, 2 g, Intravenous, Q24H, Enzo Bi, MD, Last Rate: 200 mL/hr at 04/23/21 0608, 2 g at 04/23/21 5188 ?  Chlorhexidine Gluconate Cloth 2 % PADS 6 each, 6 each, Topical, Daily, Sharen Hones, MD, 6 each at 04/23/21 1123 ?  Chlorhexidine Gluconate  Cloth 2 % PADS 6 each, 6 each, Topical, Q0600, Kris Hartmann, NP, 6 each at 04/23/21 [phone] ?  diphenhydrAMINE (BENADRYL) injection 50 mg, 50 mg, Intravenous, Once PRN, Kris Hartmann, NP ?  famotidine (PEPCID) tablet 40 mg, 40 mg, Oral, Once PRN, Kris Hartmann, NP ?  guaiFENesin (MUCINEX) 12 hr tablet 600 mg, 600 mg, Oral, BID, Sharen Hones, MD, 600 mg at 04/23/21 1123 ?  heparin injection 1,000 Units, 1,000 Units, Dialysis, PRN, Colon Flattery, NP ?  heparin injection 5,000 Units, 5,000 Units, Subcutaneous, Q8H, Narda Rutherford T, NP ?  heparin sodium (porcine) 1000 UNIT/ML injection, , , ,  ?  insulin aspart (novoLOG) injection 0-5 Units, 0-5 Units, Subcutaneous, QHS, Cox, Amy N, DO ?  insulin aspart (novoLOG) injection 0-9 Units, 0-9 Units, Subcutaneous, TID WC, Cox, Amy  N, DO, 2 Units at 04/22/21 0808 ?  levothyroxine (SYNTHROID) tablet 25 mcg, 25 mcg, Oral, Q0600, Cox, Amy N, DO, 25 mcg at 04/21/21 0531 ?  lidocaine (PF) (XYLOCAINE) 1 % injection 5 mL, 5 mL, Intradermal, PRN, Breeze, Shantelle, NP ?  lidocaine-prilocaine (EMLA) cream 1 application., 1 application., Topical, PRN, Colon Flattery, NP ?  methylPREDNISolone sodium succinate (SOLU-MEDROL) 1,000 mg in sodium chloride 0.9 % 50 mL IVPB, 1,000 mg, Intravenous, Daily, Colon Flattery, NP, Stopped at 04/22/21 2355 ?  methylPREDNISolone sodium succinate (SOLU-MEDROL) 125 mg/2 mL injection 125 mg, 125 mg, Intravenous, Once PRN, Kris Hartmann, NP ?  metoprolol succinate (TOPROL-XL) 24 hr tablet 50 mg, 50 mg, Oral, Daily, Sharen Hones, MD, 50 mg at 04/23/21 1123 ?  metoprolol tartrate (LOPRESSOR) injection 5 mg, 5 mg, Intravenous, Q2H PRN, Cox, Amy N, DO ?  midazolam (VERSED) 2 MG/ML syrup 8 mg, 8 mg, Oral, Once PRN, Kris Hartmann, NP ?  midazolam (VERSED) injection, , , PRN, Algernon Huxley, MD, 1 mg at 04/22/21 1423 ?  multivitamin (RENA-VIT) tablet 1 tablet, 1 tablet, Oral, QHS, Enzo Bi, MD ?  ondansetron [facility]) tablet 4 mg, 4 mg, Oral, Q6H  PRN **OR** ondansetron (ZOFRAN) injection 4 mg, 4 mg, Intravenous, Q6H PRN, Cox, Amy N, DO, 4 mg at 04/22/21 1036 ?  ondansetron (ZOFRAN) injection 4 mg, 4 mg, Intravenous, Q6H PRN, Kris Hartmann, NP, 4

## 2021-04-23 NOTE — Assessment & Plan Note (Addendum)
Secondary to impart volume overload as well as pneumonia.  Patient still remains hypoxic.  By 4/7, able to be weaned off of oxygen altogether. ?

## 2021-04-23 NOTE — Consult Note (Signed)
Rheumatology Consult Note ? ?HPI ? ?Reason for Consult: Positive ANCA, MPO, Renal Failure ? ?Chief Complaint  ?Patient presents with  ? Emesis  ? Acute Renal Failure  ? ? ? ?I have been asked to see this patient in consultation by Dr.Singh. ?I have reviewed the medical records in EMR. ?Blake West is a 71 y.o. male with renal failure, positive ANCA, MPO and abnormal CT scan who has medical history of CHF, CAD, gout, dysplipidemia, hypertension, type 2 DM and CKD stage IV admitted to the hospital for renal failure. He presented to the ER with elevated blood pressure , tachycardia and loose stools. On admission his creatinine was 15 with GFR 3. He also was started on antibiotic therapy for pneumonia. He also underwent CT abdomen that showed multifocal bilateral groundglass nodules.  ? ?He did see Dr.Singh in December as new patient for decline in renal function. He did test positive for P-ANCA, MPO (7.3). His ANA screen was negative. His PR3 antibody test was also negative. He was recommended to get kidney biopsy and to get started on appropriate treatment based on results but patient declined at that point. The wife also states that he does get lower extremity rash that has improved. He denies any joint pains or swelling. He denies any hempotysis. He did have episode of nose bleed that was thought to be due to oxygen from nasal cannula.  ? ?He currently states that he has mild dyspnea. He is having some nausea and hiccups. He denies any wrist or foot drop.  ? ?He has received Solumedrol 1 g daily. He is pending renal biopsy.  ? ?______________________________________________________________________ ? ? ? ?Current Facility-Administered Medications:  ?  0.9 %  sodium chloride infusion, 100 mL, Intravenous, PRN, Breeze, Shantelle, NP ?  0.9 %  sodium chloride infusion, 100 mL, Intravenous, PRN, Colon Flattery, NP ?  acetaminophen (TYLENOL) tablet 650 mg, 650 mg, Oral, Q6H PRN, 650 mg at 04/21/21 2007 **OR**  acetaminophen (TYLENOL) suppository 650 mg, 650 mg, Rectal, Q6H PRN, Cox, Amy N, DO ?  acidophilus (RISAQUAD) capsule 2 capsule, 2 capsule, Oral, TID, Enzo Bi, MD, 2 capsule at 04/23/21 1123 ?  albuterol (PROVENTIL) (2.5 MG/3ML) 0.083% nebulizer solution 2.5 mg, 2.5 mg, Nebulization, Q6H PRN, Cox, Amy N, DO ?  alteplase (CATHFLO ACTIVASE) injection 2 mg, 2 mg, Intracatheter, Once PRN, Colon Flattery, NP ?  alum & mag hydroxide-simeth (MAALOX/MYLANTA) 751-700-17 MG/5ML suspension 15 mL, 15 mL, Oral, Q6H PRN, Enzo Bi, MD, 15 mL at 04/22/21 1043 ?  azithromycin (ZITHROMAX) tablet 500 mg, 500 mg, Oral, QHS, Dolan, Carissa E, RPH ?  budesonide (PULMICORT) nebulizer solution 0.5 mg, 0.5 mg, Nebulization, Q6H PRN, Cox, Amy N, DO ?  calcium carbonate (TUMS - dosed in mg elemental calcium) chewable tablet 200 mg of elemental calcium, 1 tablet, Oral, TID PRN, Enzo Bi, MD ?  cefTRIAXone (ROCEPHIN) 2 g in sodium chloride 0.9 % 100 mL IVPB, 2 g, Intravenous, Q24H, Enzo Bi, MD, Last Rate: 200 mL/hr at 04/23/21 0608, 2 g at 04/23/21 4944 ?  Chlorhexidine Gluconate Cloth 2 % PADS 6 each, 6 each, Topical, Daily, Sharen Hones, MD, 6 each at 04/23/21 1123 ?  Chlorhexidine Gluconate Cloth 2 % PADS 6 each, 6 each, Topical, Q0600, Kris Hartmann, NP, 6 each at 04/23/21 [phone] ?  diphenhydrAMINE (BENADRYL) injection 50 mg, 50 mg, Intravenous, Once PRN, Kris Hartmann, NP ?  famotidine (PEPCID) tablet 40 mg, 40 mg, Oral, Once PRN, Kris Hartmann, NP ?  feeding supplement (NEPRO CARB STEADY) liquid 237 mL, 237 mL, Oral, BID BM, Sharen Hones, MD, 237 mL at 04/23/21 1123 ?  guaiFENesin (MUCINEX) 12 hr tablet 600 mg, 600 mg, Oral, BID, Sharen Hones, MD, 600 mg at 04/23/21 1123 ?  heparin injection 1,000 Units, 1,000 Units, Dialysis, PRN, Colon Flattery, NP ?  heparin injection 5,000 Units, 5,000 Units, Subcutaneous, Q8H, Cox, Amy N, DO, 5,000 Units at 04/23/21 0546 ?  heparin sodium (porcine) 1000 UNIT/ML injection, , , ,  ?   insulin aspart (novoLOG) injection 0-5 Units, 0-5 Units, Subcutaneous, QHS, Cox, Amy N, DO ?  insulin aspart (novoLOG) injection 0-9 Units, 0-9 Units, Subcutaneous, TID WC, Cox, Amy N, DO, 2 Units at 04/22/21 0808 ?  levothyroxine (SYNTHROID) tablet 25 mcg, 25 mcg, Oral, Q0600, Cox, Amy N, DO, 25 mcg at 04/21/21 0531 ?  lidocaine (PF) (XYLOCAINE) 1 % injection 5 mL, 5 mL, Intradermal, PRN, Breeze, Shantelle, NP ?  lidocaine-prilocaine (EMLA) cream 1 application., 1 application., Topical, PRN, Colon Flattery, NP ?  methylPREDNISolone sodium succinate (SOLU-MEDROL) 1,000 mg in sodium chloride 0.9 % 50 mL IVPB, 1,000 mg, Intravenous, Daily, Colon Flattery, NP, Stopped at 04/22/21 2355 ?  methylPREDNISolone sodium succinate (SOLU-MEDROL) 125 mg/2 mL injection 125 mg, 125 mg, Intravenous, Once PRN, Kris Hartmann, NP ?  metoprolol succinate (TOPROL-XL) 24 hr tablet 50 mg, 50 mg, Oral, Daily, Sharen Hones, MD, 50 mg at 04/23/21 1123 ?  metoprolol tartrate (LOPRESSOR) injection 5 mg, 5 mg, Intravenous, Q2H PRN, Cox, Amy N, DO ?  midazolam (VERSED) 2 MG/ML syrup 8 mg, 8 mg, Oral, Once PRN, Kris Hartmann, NP ?  midazolam (VERSED) injection, , , PRN, Algernon Huxley, MD, 1 mg at 04/22/21 1423 ?  multivitamin with minerals tablet 1 tablet, 1 tablet, Oral, Daily, Sharen Hones, MD, 1 tablet at 04/23/21 1124 ?  ondansetron (ZOFRAN) tablet 4 mg, 4 mg, Oral, Q6H PRN **OR** ondansetron (ZOFRAN) injection 4 mg, 4 mg, Intravenous, Q6H PRN, Cox, Amy N, DO, 4 mg at 04/22/21 1036 ?  ondansetron (ZOFRAN) injection 4 mg, 4 mg, Intravenous, Q6H PRN, Kris Hartmann, NP, 4 mg at 04/23/21 0546 ?  pentafluoroprop-tetrafluoroeth (GEBAUERS) aerosol 1 application., 1 application., Topical, PRN, Colon Flattery, NP ? ?Allergies  ?Allergen Reactions  ? Codeine   ?  Joints hurt   ? ? ?Past Medical History:  ?Diagnosis Date  ? Abnormal EKG   ? Biventricular failure ([place])   ? CAD (coronary artery disease)   ? Colorectal polyps   ? Gout   ?  Hyperlipidemia LDL goal <70   ? Hypertension   ? Hypothyroidism   ? Kidney stone   ? Pneumonia   ? Testicular hypofunction   ? Type 2 diabetes mellitus ([place]) 04/26/2019  ? ? ?Past Surgical History:  ?Procedure Laterality Date  ? CARDIAC CATHETERIZATION    ? DIALYSIS/PERMA CATHETER INSERTION N/A 04/22/2021  ? Procedure: DIALYSIS/PERMA CATHETER INSERTION;  Surgeon: Algernon Huxley, MD;  Location: [facility];  Service: Cardiovascular;  Laterality: N/A;  ? FOOT SURGERY    ? RIGHT/LEFT HEART CATH AND CORONARY ANGIOGRAPHY N/A 04/30/2019  ? Procedure: RIGHT/LEFT HEART CATH AND CORONARY ANGIOGRAPHY;  Surgeon: Troy Sine, MD;  Location: [facility];  Service: Cardiovascular;  Laterality: N/A;  ? ? ?Family History  ?Problem Relation Age of Onset  ? Heart attack Mother 25  ? Pulmonary fibrosis Father   ? Congestive Heart Failure Brother   ? Heart attack Brother   ?  Heart attack Brother   ? Early death Sister   ? Kidney disease Paternal Grandfather   ? Diabetes Sister   ? ? ?Social History  ? ?Tobacco Use  ? Smoking status: Former  ?  Packs/day: 1.00  ?  Years: 30.00  ?  Pack years: 30.00  ?  Types: Cigarettes  ?  Quit date: 01/24/2002  ?  Years since quitting: 19.2  ? Smokeless tobacco: Never  ?Substance Use Topics  ? Alcohol use: No  ? ? ?______________________________________________________________________ ? ?Review of Systems:  ?Review of Systems  ?Constitutional:  Positive for appetite change and fatigue.  ?HENT:  Positive for nosebleeds. Negative for mouth sores, sinus pressure, sinus pain and trouble swallowing.   ?Eyes:  Negative for pain.  ?Respiratory:  Positive for cough and shortness of breath.   ?Cardiovascular:  Positive for leg swelling. Negative for chest pain.  ?Gastrointestinal:  Positive for nausea. Negative for diarrhea.  ?Musculoskeletal:  Negative for arthralgias and joint swelling.  ?Skin:  Positive for rash.  ?Neurological:  Positive for weakness. Negative for dizziness and headaches.   ?Hematological:  Negative for adenopathy. Does not bruise/bleed easily.  ?Psychiatric/Behavioral:  Positive for sleep disturbance.   ? ?Objective: ? ?Blood pressure (!) 140/101, pulse 98, temperature 98.5 ?F (36.9 ?C)

## 2021-04-23 NOTE — Progress Notes (Signed)
?[facility]  ?ROUNDING NOTE  ? ?Subjective:  ? 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  Patient presents to the emergency department with complaints of nausea, vomiting, generalized decline.  Patient has been admitted for Hyperkalemia [E87.5] ?Atrial fibrillation with RVR ([place]) [I48.91] ?Severe sepsis with acute organ dysfunction ([place]) [A41.9, R65.20] ?Acute renal failure, unspecified acute renal failure type ([place]) [N17.9] ? ? ?Update: ?Patient seen and evaluated during dialysis ?  ?HEMODIALYSIS FLOWSHEET: ? ?Blood Flow Rate (mL/min): 250 mL/min ?Arterial Pressure (mmHg): -110 mmHg ?Venous Pressure (mmHg): 90 mmHg ?Transmembrane Pressure (mmHg): 20 mmHg ?Ultrafiltration Rate (mL/min): 0 mL/min ?Dialysate Flow Rate (mL/min): 300 ml/min ?Conductivity: Machine : 13.8 ?Conductivity: Machine : 13.8 ?Dialysis Fluid Bolus: Normal Saline ?Bolus Amount (mL): 250 mL ? ?Drowsy ?Patient seen later in room with wife and daughter at bedside ?Family reports nausea since Permcath placement yesterday.  ?Unable to tolerated meals ?Denies pain and discomfort ? ? ?Objective:  ?Vital signs in last 24 hours:  ?Temp:  [97.1 ?F (36.2 ?C)-97.9 ?F (36.6 ?C)] 97.5 ?F (36.4 ?C) (03/31 1111) ?Pulse Rate:  [0-130] 105 (03/31 1111) ?Resp:  [12-38] 38 (03/31 1030) ?BP: (111-161)/(77-113) 140/101 (03/31 1111) ?SpO2:  [86 %-100 %] 89 % (03/31 1111) ?Weight:  [84.1 kg-89.7 kg] 84.1 kg (03/31 1021) ? ?Weight change:  ?Filed Weights  ? 04/22/21 1845 04/23/21 0812 04/23/21 1021  ?Weight: 89.3 kg 84.9 kg 84.1 kg  ? ? ?Intake/Output: ?I/O last 3 completed shifts: ?In: 1509.9 [I.V.:1182.5; IV Piggyback:327.4] ?Out: 1703 [Urine:1700; Other:3] ?  ?Intake/Output this shift: ? Total I/O ?In: -  ?Out: -379  ? ?Physical Exam: ?General: NAD, somnolent   ?Head: Normocephalic, atraumatic.  Dry oral mucosal membranes  ?Eyes: Anicteric  ?Lungs:  Clear to auscultation, normal effort, room air  ?Heart: Regular rate and rhythm  ?Abdomen:  Soft, nontender, nondistended  ?Extremities: No peripheral edema.  ?Neurologic: Lethargic, arousable, moving all four extremities  ?Skin: No lesions  ?Access: Rt permcath placed on 04/22/21 by Dr Lucky Cowboy  ? ? ?Basic Metabolic Panel: ?Recent Labs  ?Lab 04/19/21 ?2110 04/20/21 ?9562 04/21/21 ?0434 04/22/21 ?1308 04/23/21 ?0419  ?NA 135 139 139 139 139  ?K 6.8* 5.1 4.2 4.8 3.6  ?CL 103 106 103 102 96*  ?CO2 8* 9* 14* 12* 24  ?GLUCOSE 116* 119* 101* 171* 200*  ?BUN 247* 195* 220* 159* 156*  ?CREATININE 15.05* 14.69* 13.87* 14.09* 10.24*  ?CALCIUM 9.6 8.7* 8.1* 8.1* 8.1*  ?MG 3.2* 2.9*  --  2.9* 2.6*  ?PHOS  --  13.2*  --   --   --   ? ? ? ?Liver Function Tests: ?Recent Labs  ?Lab 04/19/21 ?2110 04/21/21 ?0434  ?AST 11* 12*  ?ALT 9 10  ?ALKPHOS 68 48  ?BILITOT 1.0 1.3*  ?PROT 8.1 6.2*  ?ALBUMIN 3.7 3.0*  ? ? ?Recent Labs  ?Lab 04/19/21 ?2110  ?LIPASE 88*  ? ? ?No results for input(s): AMMONIA in the last 168 hours. ? ?CBC: ?Recent Labs  ?Lab 04/19/21 ?2110 04/20/21 ?6578 04/21/21 ?0434 04/22/21 ?4696 04/23/21 ?0419  ?WBC 15.7* 14.4* 13.1* 7.0 12.2*  ?NEUTROABS 14.3* 11.4* 10.3*  --   --   ?HGB 13.3 10.6* 10.1* 10.0* 10.0*  ?HCT 40.5 32.0* 29.6* 30.2* 29.0*  ?MCV 86.9 87.0 84.3 84.8 83.8  ?PLT 267 221 205 197 187  ? ? ? ?Cardiac Enzymes: ?No results  for input(s): CKTOTAL, CKMB, CKMBINDEX, TROPONINI in the last 168 hours. ? ?BNP: ?Invalid input(s): POCBNP ? ?CBG: ?Recent Labs  ?Lab 04/22/21 ?0755 04/22/21 ?1055 04/22/21 ?1405 04/22/21 ?1540 04/23/21 ?1107  ?GLUCAP 184* 176* 194* 195* 163*  ? ? ? ?Microbiology: ?Results for orders placed or performed during the hospital encounter of 04/19/21  ?Resp Panel by RT-PCR (Flu A&B, Covid) Nasopharyngeal Swab     Status: None  ? Collection Time: 04/19/21  9:14 PM  ? Specimen: Nasopharyngeal Swab;  Nasopharyngeal(NP) swabs in vial transport medium  ?Result Value Ref Range Status  ? SARS Coronavirus 2 by RT PCR NEGATIVE NEGATIVE Final  ?  Comment: (NOTE) ?SARS-CoV-2 target nucleic acids are NOT DETECTED. ? ?The SARS-CoV-2 RNA is generally detectable in upper respiratory ?specimens during the acute phase of infection. The lowest ?concentration of SARS-CoV-2 viral copies this assay can detect is ?138 copies/mL. A negative result does not preclude SARS-Cov-2 ?infection and should not be used as the sole basis for treatment or ?other patient management decisions. A negative result may occur with  ?improper specimen collection/handling, submission of specimen other ?than nasopharyngeal swab, presence of viral mutation(s) within the ?areas targeted by this assay, and inadequate number of viral ?copies(<138 copies/mL). A negative result must be combined with ?clinical observations, patient history, and epidemiological ?information. The expected result is Negative. ? ?Fact Sheet for Patients:  ?EntrepreneurPulse.com.au ? ?Fact Sheet for Healthcare Providers:  ?IncredibleEmployment.be ? ?This test is no t yet approved or cleared by the [place] FDA and  ?has been authorized for detection and/or diagnosis of SARS-CoV-2 by ?FDA under an Emergency Use Authorization (EUA). This EUA will remain  ?in effect (meaning this test can be used) for the duration of the ?COVID-19 declaration under Section 564(b)(1) of the Act, 21 ?U.S.C.section 360bbb-3(b)(1), unless the authorization is terminated  ?or revoked sooner.  ? ? ?  ? Influenza A by PCR NEGATIVE NEGATIVE Final  ? Influenza B by PCR NEGATIVE NEGATIVE Final  ?  Comment: (NOTE) ?The Xpert Xpress SARS-CoV-2/FLU/RSV plus assay is intended as an aid ?in the diagnosis of influenza from Nasopharyngeal swab specimens and ?should not be used as a sole basis for treatment. Nasal washings and ?aspirates are unacceptable for Xpert Xpress  SARS-CoV-2/FLU/RSV ?testing. ? ?Fact Sheet for Patients: ?EntrepreneurPulse.com.au ? ?Fact Sheet for Healthcare Providers: ?IncredibleEmployment.be ? ?This test is not yet approved or cleared by the [place] FDA and ?has been authorized for detection and/or diagnosis of SARS-CoV-2 by ?FDA under an Emergency Use Authorization (EUA). This EUA will remain ?in effect (meaning this test can be used) for the duration of the ?COVID-19 declaration under Section 564(b)(1) of the Act, 21 U.S.C. ?section 360bbb-3(b)(1), unless the authorization is terminated or ?revoked. ? ?Performed at [facility], [place], ?[place] [address] ?  ?Culture, blood (routine x 2)     Status: None (Preliminary result)  ? Collection Time: 04/19/21  9:45 PM  ? Specimen: BLOOD  ?Result Value Ref Range Status  ? Specimen Description BLOOD RIGHT ANTECUBITAL  Final  ? Special Requests   Final  ?  BOTTLES DRAWN AEROBIC AND ANAEROBIC Blood Culture adequate volume  ? Culture   Final  ?  NO GROWTH 4 DAYS ?Performed at [facility], [address] [place], [place] [address] ?  ? Report Status PENDING  Incomplete  ?Culture, blood (routine x 2)     Status: None (Preliminary result)  ? Collection Time: 04/19/21 10:32 PM  ? Specimen: BLOOD  ?  Result Value Ref Range Status  ? Specimen Description BLOOD LEFT ASSIST CONTROL  Final  ? Special Requests   Final  ?  BOTTLES DRAWN AEROBIC AND ANAEROBIC Blood Culture adequate volume  ? Culture   Final  ?  NO GROWTH 4 DAYS ?Performed at [facility], [address] [place], [place] [address] ?  ? Report Status PENDING  Incomplete  ?MRSA Next Gen by PCR, Nasal     Status: None  ? Collection Time: 04/20/21  2:21 AM  ? Specimen: Nasal Mucosa; Nasal Swab  ?Result Value Ref Range Status  ? MRSA by PCR Next Gen NOT DETECTED NOT DETECTED Final  ?  Comment: (NOTE) ?The GeneXpert MRSA Assay (FDA approved for NASAL specimens only), ?is one  component of a comprehensive MRSA colonization surveillance ?program. It is not intended to diagnose MRSA infection nor to guide ?or monitor treatment for MRSA infections. ?Test performance is not FDA approved in patients les

## 2021-04-23 NOTE — Progress Notes (Signed)
Hemodialysis Post Treatment Note: ? ?Tx date: 04/23/2021 ?Tx time: 2hours ?Access: Right CVC ?UF Removed: 0 ?  ?Note: ? ?Patient placed on nasal cannula 2litrs at start of dialysis due to oxygen saturations in 80s on room air upon arrival. No fluid removed with dialysis. CVC dressing changed, limbs wrapped with gauze/taped. Tolerated treatment well,no adverse effects noted. ? ? ? ? ? ? ? ? ?  ?

## 2021-04-23 NOTE — Progress Notes (Signed)
Per Dr. Remonia Richter note and Sherryle Lis, gave IV Cardizem (no special administration instructions per Pharmacy) due to patient sustaining heart rate above 110's for more than 20 minutes. ?

## 2021-04-23 NOTE — Progress Notes (Signed)
?Progress Note ? ? ?Patient: Blake West DOB: 1950/08/12 DOA: 04/19/2021     4 ?DOS: the patient was seen and examined on 04/23/2021 ?  ?Brief hospital course: ?[name] is a 71 year old male with history of biventricular heart failure, hypertension, gout, chronic heart failure reduced ejection fraction, history of glomerulonephritis, CKD stage IV, who presents emergency department from outpatient clinic for chief concerns of renal failure.  Patient has been followed by nephrology in the office, recommended work-up for vasculitis, but patient has not been interested per Dr. Candiss Norse. ?Patient has been having coughing and shortness of breath for 2 weeks, nausea, vomiting and diarrhea for 1 week. ?Upon arriving the hospital, patient had a serum creatinine of 15, bicarb 8, potassium 6.8.  Chest CT scan showed scattered nodular groundglass airspace disease. ?Patient is treated with bicarb drip, Zithromax, vancomycin and cefepime. ? ? ? ?Assessment and Plan: ?* Acute renal failure superimposed on stage 4 chronic kidney disease ([place]) ?--Cr 15 on presentation, BUN 247. ?Patient had a chronic kidney disease stage IV, etiology still unclear.  Worsening renal function due to dehydration from diarrhea. ?Patient also has significant hyperkalemia and metabolic acidosis.   ?--there was also concern for vasculitis ?--dialysis cath placement and started dialysis on 3/30 ?Plan: ?--vasculitis workup per nephro ?--Continued on IV Solu-Medrol x3 days, switch over to oral prednisone subsequently, per nephro ?--plan on kidney biopsy early next week after uremia improves ?--Plan for rituximab based on biopsy results ? ?Acute hypoxemic respiratory failure ([place]) ?--developed overnight, hypoxic requiring 4L O2.  Likely due to fluid overload from bicarb gtt. ?Plan: ?--d/c bicarb gtt ?--dialysis for fluid removal ? ? ?Malnutrition of moderate degree ?--supplements per dietician  ? ?High anion gap metabolic acidosis ?This is  secondary to acute on chronic renal failure.  ?--s/p Na bicarb gtt ?--d/c bicarb gtt today ? ?Chronic atrial fibrillation with RVR ([facility]) ?--cont Toprol ?--Will consider anticoagulation after renal function improves. ? ?Multifocal pneumonia ?Mild elevation of procalcitonin level in the setting of acute renal failure.  MRSA culture negative, discontinued vancomycin.   ?--cont Rocephin and Zithromax for total 5 day abx ? ?Hyperkalemia ?Potassium has normalized.  This is secondary to acute kidney injury as well as severe metabolic acidosis. ? ?Diarrhea ?Patient has been having diarrhea for 1 week, but none since presentation, so C diff and GI path could not be collected. ? ?Severe sepsis with acute organ dysfunction ([place]) ?Patient has severe sepsis with tachycardia, tachypnea, leukocytosis and a severe renal failure.  This is most likely due to pneumonia. ?He has received IV fluids.  ? ? ?Chronic HFrEF (heart failure with reduced ejection fraction) ([place]) ?Acute CHF ruled out. ?Reviewed echocardiogram performed/2021, ejection fraction 20 to 25%. ?Patient was volume depleted at time of admission, no exacerbation of congestive heart failure.  ?Patient was not on evidence-based congestive heart failure treatment. ?--cont metoprolol succinate at 50 mg daily to help control the heart rate (new) ? ?Hypothyroidism ?Continue Synthroid. ? ?Hyperlipidemia ?Not currently treated. ? ?Essential hypertension ?Blood pressure stable ?--cont Toprol ? ? ? ? ?  ? ?Subjective:  ?Overnight, pt developed hypoxia requiring 4L O2. ? ?RN and wife noted left collar bone protruding out, however, no pain associated.  Still having nausea. ? ? ?Physical Exam: ? ?Constitutional: NAD, AAOx3 ?HEENT: conjunctivae and lids normal, EOMI ?CV: No cyanosis.   ?RESP: normal respiratory effort, on 4L ?SKIN: warm, dry ?Neuro: II - XII grossly intact.   ? ? ?Data Reviewed: ? ?Family Communication: wife updated  at bedside today ? ?Disposition: ?Status is:  Inpatient ?Remains inpatient appropriate because: starting dialysis, pending kidney biopsy ? ? Planned Discharge Destination: Home  family declined SNF ? ? ? ?Time spent: 35 minutes ? ?Author: ?Enzo Bi, MD ?04/23/2021 3:55 PM ? ?For on call review [email or web address].  ?

## 2021-04-23 NOTE — Progress Notes (Signed)
Per Dr Billie Ruddy, "if HR sustained >110's for 20 minutes, then go ahead and give IV dilt. I will order him some oral dilt following that." ?

## 2021-04-24 DIAGNOSIS — N170 Acute kidney failure with tubular necrosis: Secondary | ICD-10-CM | POA: Diagnosis not present

## 2021-04-24 DIAGNOSIS — N184 Chronic kidney disease, stage 4 (severe): Secondary | ICD-10-CM | POA: Diagnosis not present

## 2021-04-24 LAB — CBC
HCT: 31.1 % — ABNORMAL LOW (ref 39.0–52.0)
Hemoglobin: 10.4 g/dL — ABNORMAL LOW (ref 13.0–17.0)
MCH: 29.1 pg (ref 26.0–34.0)
MCHC: 33.4 g/dL (ref 30.0–36.0)
MCV: 86.9 fL (ref 80.0–100.0)
Platelets: 176 10*3/uL (ref 150–400)
RBC: 3.58 MIL/uL — ABNORMAL LOW (ref 4.22–5.81)
RDW: 14.3 % (ref 11.5–15.5)
WBC: 13 10*3/uL — ABNORMAL HIGH (ref 4.0–10.5)
nRBC: 0 % (ref 0.0–0.2)

## 2021-04-24 LAB — CULTURE, BLOOD (ROUTINE X 2)
Culture: NO GROWTH
Culture: NO GROWTH
Special Requests: ADEQUATE
Special Requests: ADEQUATE

## 2021-04-24 LAB — RHEUMATOID FACTOR: Rheumatoid fact SerPl-aCnc: <10 IU/mL (ref <14.0)

## 2021-04-24 LAB — MAGNESIUM: Magnesium: 2.5 mg/dL — ABNORMAL HIGH (ref 1.7–2.4)

## 2021-04-24 LAB — GLUCOSE, CAPILLARY
Glucose-Capillary: 178 mg/dL — ABNORMAL HIGH (ref 70–99)
Glucose-Capillary: 191 mg/dL — ABNORMAL HIGH (ref 70–99)
Glucose-Capillary: 163 mg/dL — ABNORMAL HIGH (ref 70–99)

## 2021-04-24 LAB — BASIC METABOLIC PANEL
Anion gap: 17 — ABNORMAL HIGH (ref 5–15)
BUN: 126 mg/dL — ABNORMAL HIGH (ref 8–23)
CO2: 26 mmol/L (ref 22–32)
Calcium: 8.5 mg/dL — ABNORMAL LOW (ref 8.9–10.3)
Chloride: 96 mmol/L — ABNORMAL LOW (ref 98–111)
Creatinine, Ser: 8.04 mg/dL — ABNORMAL HIGH (ref 0.61–1.24)
GFR, Estimated: 7 mL/min — ABNORMAL LOW (ref >60)
Glucose, Bld: 174 mg/dL — ABNORMAL HIGH (ref 70–99)
Potassium: 3.8 mmol/L (ref 3.5–5.1)
Sodium: 139 mmol/L (ref 135–145)

## 2021-04-24 MED ORDER — METOPROLOL SUCCINATE ER 100 MG PO TB24
100.0000 mg | ORAL_TABLET | Freq: Every day | ORAL | Status: DC
  Administered 2021-04-25 – 2021-04-26 (×2): 100 mg via ORAL
  Filled 2021-04-24 (×2): qty 1

## 2021-04-24 MED ORDER — HALOPERIDOL LACTATE 5 MG/ML IJ SOLN
2.0000 mg | Freq: Four times a day (QID) | INTRAMUSCULAR | Status: DC | PRN
  Administered 2021-04-24 – 2021-04-28 (×3): 2 mg via INTRAVENOUS
  Filled 2021-04-24 (×3): qty 1

## 2021-04-24 MED ORDER — QUETIAPINE FUMARATE 25 MG PO TABS
25.0000 mg | ORAL_TABLET | Freq: Every day | ORAL | Status: DC
  Administered 2021-04-24: 25 mg via ORAL
  Filled 2021-04-24: qty 1

## 2021-04-24 MED ORDER — HEPARIN SODIUM (PORCINE) 1000 UNIT/ML IJ SOLN
INTRAMUSCULAR | Status: AC
  Filled 2021-04-24: qty 10

## 2021-04-24 NOTE — Progress Notes (Signed)
?[facility]  ?ROUNDING NOTE  ? ?Subjective:  ? 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  Patient presents to the emergency department with complaints of nausea, vomiting, generalized decline.  Patient has been admitted for Hyperkalemia [E87.5] ?Atrial fibrillation with RVR ([place]) [I48.91] ?Severe sepsis with acute organ dysfunction ([place]) [A41.9, R65.20] ?Acute renal failure, unspecified acute renal failure type ([place]) [N17.9] ? ? ?Update: ?Patient seen resting in bed, wife at bedside ?Alert an oriented to person and place ?Appetite remains poor ?Patient seen later with wife and daughter and daughter at bedside ?Answered questions related to treatment plan and upcoming renal biopsy. ? ? ? ?Objective:  ?Vital signs in last 24 hours:  ?Temp:  [97.7 ?F (36.5 ?C)-98.4 ?F (36.9 ?C)] 97.7 ?F (36.5 ?C) (04/01 0957) ?Pulse Rate:  [46-136] 104 (04/01 1200) ?Resp:  [15-20] 15 (04/01 1130) ?BP: (115-134)/(70-95) 115/91 (04/01 1200) ?SpO2:  [85 %-95 %] 91 % (04/01 1200) ?Weight:  [84.7 kg] 84.7 kg (04/01 0957) ? ?Weight change: -4.8 kg ?Filed Weights  ? 04/23/21 7829 04/23/21 1021 04/24/21 0957  ?Weight: 84.9 kg 84.1 kg 84.7 kg  ? ? ?Intake/Output: ?I/O last 3 completed shifts: ?In: 1629.9 [P.O.:120; I.V.:1182.5; IV Piggyback:327.4] ?Out: 721 [Urine:1100] ?  ?Intake/Output this shift: ? No intake/output data recorded. ? ?Physical Exam: ?General: NAD  ?Head: Normocephalic, atraumatic.  Dry oral mucosal membranes  ?Eyes: Anicteric  ?Lungs:  Clear to auscultation, normal effort, [place] O2  ?Heart: Regular rate and rhythm  ?Abdomen:  Soft, nontender, nondistended  ?Extremities: No peripheral edema.  ?Neurologic: Alert, moving all four extremities  ?Skin: No lesions  ?Access: Rt permcath placed on 04/22/21 by Dr Lucky Cowboy  ? ? ?Basic  Metabolic Panel: ?Recent Labs  ?Lab 04/19/21 ?2110 04/20/21 ?5621 04/21/21 ?3086 04/22/21 ?5784 04/23/21 ?6962 04/24/21 ?9528  ?NA 135 139 139 139 139 139  ?K 6.8* 5.1 4.2 4.8 3.6 3.8  ?CL 103 106 103 102 96* 96*  ?CO2 8* 9* 14* 12* 24 26  ?GLUCOSE 116* 119* 101* 171* 200* 174*  ?BUN 247* 195* 220* 159* 156* 126*  ?CREATININE 15.05* 14.69* 13.87* 14.09* 10.24* 8.04*  ?CALCIUM 9.6 8.7* 8.1* 8.1* 8.1* 8.5*  ?MG 3.2* 2.9*  --  2.9* 2.6* 2.5*  ?PHOS  --  13.2*  --   --   --   --   ? ? ? ?Liver Function Tests: ?Recent Labs  ?Lab 04/19/21 ?2110 04/21/21 ?0434  ?AST 11* 12*  ?ALT 9 10  ?ALKPHOS 68 48  ?BILITOT 1.0 1.3*  ?PROT 8.1 6.2*  ?ALBUMIN 3.7 3.0*  ? ? ?Recent Labs  ?Lab 04/19/21 ?2110  ?LIPASE 88*  ? ? ?No results for input(s): AMMONIA in the last 168 hours. ? ?CBC: ?Recent Labs  ?Lab 04/19/21 ?2110 04/20/21 ?4132 04/21/21 ?4401 04/22/21 ?0272 04/23/21 ?5366 04/24/21 ?4403  ?WBC 15.7* 14.4* 13.1* 7.0 12.2* 13.0*  ?NEUTROABS 14.3* 11.4* 10.3*  --   --   --   ?HGB 13.3 10.6* 10.1* 10.0* 10.0* 10.4*  ?HCT 40.5 32.0* 29.6* 30.2* 29.0* 31.1*  ?MCV 86.9 87.0 84.3 84.8 83.8 86.9  ?PLT 267 221 205 197 187 176  ? ? ? ?Cardiac Enzymes: ?No results for input(s): CKTOTAL, CKMB, CKMBINDEX, TROPONINI in the last 168 hours. ? ?BNP: ?Invalid input(s): POCBNP ? ?CBG: ?Recent Labs  ?Lab 04/22/21 ?4742 04/23/21 ?1107 04/23/21 ?1659 04/23/21 ?2133 04/24/21 ?0746  ?  GLUCAP 195* 163* 151* 155* 178*  ? ? ? ?Microbiology: ?Results for orders placed or performed during the hospital encounter of 04/19/21  ?Resp Panel by RT-PCR (Flu A&B, Covid) Nasopharyngeal Swab     Status: None  ? Collection Time: 04/19/21  9:14 PM  ? Specimen: Nasopharyngeal Swab; Nasopharyngeal(NP) swabs in vial transport medium  ?Result Value Ref Range Status  ? SARS Coronavirus 2 by RT PCR NEGATIVE NEGATIVE Final  ?  Comment: (NOTE) ?SARS-CoV-2 target nucleic acids are NOT DETECTED. ? ?The SARS-CoV-2 RNA is generally detectable in upper respiratory ?specimens during the  acute phase of infection. The lowest ?concentration of SARS-CoV-2 viral copies this assay can detect is ?138 copies/mL. A negative result does not preclude SARS-Cov-2 ?infection and should not be used as the sole basis for treatment or ?other patient management decisions. A negative result may occur with  ?improper specimen collection/handling, submission of specimen other ?than nasopharyngeal swab, presence of viral mutation(s) within the ?areas targeted by this assay, and inadequate number of viral ?copies(<138 copies/mL). A negative result must be combined with ?clinical observations, patient history, and epidemiological ?information. The expected result is Negative. ? ?Fact Sheet for Patients:  ?EntrepreneurPulse.com.au ? ?Fact Sheet for Healthcare Providers:  ?IncredibleEmployment.be ? ?This test is no t yet approved or cleared by the [place] FDA and  ?has been authorized for detection and/or diagnosis of SARS-CoV-2 by ?FDA under an Emergency Use Authorization (EUA). This EUA will remain  ?in effect (meaning this test can be used) for the duration of the ?COVID-19 declaration under Section 564(b)(1) of the Act, 21 ?U.S.C.section 360bbb-3(b)(1), unless the authorization is terminated  ?or revoked sooner.  ? ? ?  ? Influenza A by PCR NEGATIVE NEGATIVE Final  ? Influenza B by PCR NEGATIVE NEGATIVE Final  ?  Comment: (NOTE) ?The Xpert Xpress SARS-CoV-2/FLU/RSV plus assay is intended as an aid ?in the diagnosis of influenza from Nasopharyngeal swab specimens and ?should not be used as a sole basis for treatment. Nasal washings and ?aspirates are unacceptable for Xpert Xpress SARS-CoV-2/FLU/RSV ?testing. ? ?Fact Sheet for Patients: ?EntrepreneurPulse.com.au ? ?Fact Sheet for Healthcare Providers: ?IncredibleEmployment.be ? ?This test is not yet approved or cleared by the [place] FDA and ?has been authorized for detection and/or  diagnosis of SARS-CoV-2 by ?FDA under an Emergency Use Authorization (EUA). This EUA will remain ?in effect (meaning this test can be used) for the duration of the ?COVID-19 declaration under Section 564(b)(1) of the Act, 21 U.S.C. ?section 360bbb-3(b)(1), unless the authorization is terminated or ?revoked. ? ?Performed at [facility], [place], ?[place] [address] ?  ?Culture, blood (routine x 2)     Status: None  ? Collection Time: 04/19/21  9:45 PM  ? Specimen: BLOOD  ?Result Value Ref Range Status  ? Specimen Description BLOOD RIGHT ANTECUBITAL  Final  ? Special Requests   Final  ?  BOTTLES DRAWN AEROBIC AND ANAEROBIC Blood Culture adequate volume  ? Culture   Final  ?  NO GROWTH 5 DAYS ?Performed at [facility], [address], [place], [place] [address] ?  ? Report Status 04/24/2021 FINAL  Final  ?Culture, blood (routine x 2)     Status: None  ? Collection Time: 04/19/21 10:32 PM  ? Specimen: BLOOD  ?Result Value Ref Range Status  ? Specimen Description BLOOD LEFT ASSIST CONTROL  Final  ? Special Requests   Final  ?  BOTTLES DRAWN AEROBIC AND ANAEROBIC Blood Culture adequate volume  ?  Culture   Final  ?  NO GROWTH 5 DAYS ?Performed at [facility], [address] [place], [place] [address] ?  ? Report Status 04/24/2021 FINAL  Final  ?MRSA Next Gen by PCR, Nasal     Status: None  ? Collection Time: 04/20/21  2:21 AM  ? Specimen: Nasal Mucosa; Nasal Swab  ?Result Value Ref Range Status  ? MRSA by PCR Next Gen NOT DETECTED NOT DETECTED Final  ?  Comment: (NOTE) ?The GeneXpert MRSA Assay (FDA approved for NASAL specimens only), ?is one component of a comprehensive MRSA colonization surveillance ?program. It is not intended to diagnose MRSA infection nor to guide ?or monitor treatment for MRSA infections. ?Test performance is not FDA approved in patients less than 2 years ?old. ?Performed at [facility], [place], ?[place] [address] ?  ?Urine  Culture     Status: None  ? Collection Time: 04/20/21  5:45 AM  ? Specimen: Urine, Clean Catch  ?Result Value Ref Range Status  ? Specimen Description   Final  ?  URINE, CLEAN CATCH ?Performed at [facility]

## 2021-04-24 NOTE — Progress Notes (Signed)
Patient started third dialysis treatment as ordered. Patient completed 2.5 hours treatment with no s/s of adverse effects noted or reported. Patient experienced asymptomatic tachycardia on and off when he became restless and agitated. V/S within normal limits. Report given to [facility] D. Morgan-Johnson, RN ?

## 2021-04-24 NOTE — Progress Notes (Signed)
? ? ? ?PULMONOLOGY ? ? ? ? ? ? ? ? ?Date: 04/24/2021,   ?MRN# [id number] Blake West 06-29-1950 ? ? ?  ?AdmissionWeight: 83 kg                 ?CurrentWeight: 83.6 kg ? ?Referring provider: Dr Candiss Norse ? ? ?CHIEF COMPLAINT:  ? ?Anca + vasculitis  ? ? ?HISTORY OF PRESENT ILLNESS  ? ?This is a pleasant 71 year old male with a history of CHF with reduced EF, coronary disease gout, dyslipidemia essential hypertension, hypothyroidism testicular dysfunction, type 2 diabetes, history of glomerulonephritis with CKD stage IV came in from outpatient clinic due to provider concerns for ongoing progressive renal failure.  Patient is essentially a lifelong non-smoker reporting smoking in the 1950s.  He does report mild and subacute and chronic cough with expectoration of whitish phlegm.  Patient reports decreased appetite as well as loose stools for less than 1 week prior to arrival.  In the emergency department patient had accelerated hypertension with tachycardia however normoxia on room air.  His initial creatinine was 15 with a GFR of 3 remainder of blood work was essentially unremarkable besides electrolyte derangements including potassium 6.8 without EKG changes.  He was negative for COVID influenza a and B on arrival.  He had chest imaging done without contrast which showed nodular groundglass airspace opacification of the lung.  There was concern for pneumonia and patient is empirically treated with cefepime and vancomycin and Zithromax on arrival.  Also received Lokelma for elevated potassium.  Spinal this patient was lucid without altered mentation.  CT imaging with lung windows were independently reviewed by me with findings of chronic bronchitic changes as well as punctate multifocal bilateral groundglass nodules these findings are nonspecific and can be seen with atypical infection, viral lower respiratory tract infection as well as inflammatory lung disease. ? ? ?04/23/21- patient resting in bed with family at bedside  during my evaluation. Patient is in no distress with nasal canula off his face normoxic on room air. He ate today with better appetite and was able to get up OOB to bathroom.  Family shares he seems to be feeling better overall. Wife shares the right clavicle oppposite side of trialysis catheter seems to appear different then usual and requests imaging to be done.  I have ordered CXR.  Noted CRP with moderate elevation. Infectious workup in progress , blood work reviewed with improved GFR post HD, mild hyperglycemia and leukocytosis post pulse dose steriods, no overnight events. Appreciate everyone caring for this patient. No changes to therapy from pulm perspective.  ? ?04/24/21- patient is stable no overnight events. Repeat chest x ray with mild atelectasis.  Recommend use of incentive spirometer when able.  ? ?PAST MEDICAL HISTORY  ? ?Past Medical History:  ?Diagnosis Date  ? Abnormal EKG   ? Biventricular failure ([place])   ? CAD (coronary artery disease)   ? Colorectal polyps   ? Gout   ? Hyperlipidemia LDL goal <70   ? Hypertension   ? Hypothyroidism   ? Kidney stone   ? Pneumonia   ? Testicular hypofunction   ? Type 2 diabetes mellitus ([place]) 04/26/2019  ? ? ? ?SURGICAL HISTORY  ? ?Past Surgical History:  ?Procedure Laterality Date  ? CARDIAC CATHETERIZATION    ? DIALYSIS/PERMA CATHETER INSERTION N/A 04/22/2021  ? Procedure: DIALYSIS/PERMA CATHETER INSERTION;  Surgeon: Algernon Huxley, MD;  Location: [facility];  Service: Cardiovascular;  Laterality: N/A;  ? FOOT SURGERY    ?  RIGHT/LEFT HEART CATH AND CORONARY ANGIOGRAPHY N/A 04/30/2019  ? Procedure: RIGHT/LEFT HEART CATH AND CORONARY ANGIOGRAPHY;  Surgeon: Troy Sine, MD;  Location: [facility];  Service: Cardiovascular;  Laterality: N/A;  ? ? ? ?FAMILY HISTORY  ? ?Family History  ?Problem Relation Age of Onset  ? Heart attack Mother 103  ? Pulmonary fibrosis Father   ? Congestive Heart Failure Brother   ? Heart attack Brother   ? Heart attack Brother    ? Early death Sister   ? Kidney disease Paternal Grandfather   ? Diabetes Sister   ? ? ? ?SOCIAL HISTORY  ? ?Social History  ? ?Tobacco Use  ? Smoking status: Former  ?  Packs/day: 1.00  ?  Years: 30.00  ?  Pack years: 30.00  ?  Types: Cigarettes  ?  Quit date: 01/24/2002  ?  Years since quitting: 19.2  ? Smokeless tobacco: Never  ?Vaping Use  ? Vaping Use: Never used  ?Substance Use Topics  ? Alcohol use: No  ? Drug use: No  ? ? ? ?MEDICATIONS  ? ? ?Home Medication:  ?  ?Current Medication: ? ?Current Facility-Administered Medications:  ?  (feeding supplement) PROSource Plus liquid 30 mL, 30 mL, Oral, TID BM, Enzo Bi, MD, 30 mL at 04/24/21 0827 ?  0.9 %  sodium chloride infusion, 100 mL, Intravenous, PRN, Lucky Cowboy, Erskine Squibb, MD ?  0.9 %  sodium chloride infusion, 100 mL, Intravenous, PRN, Lucky Cowboy, Erskine Squibb, MD ?  acetaminophen (TYLENOL) tablet 650 mg, 650 mg, Oral, Q6H PRN, 650 mg at 04/23/21 1814 **OR** acetaminophen (TYLENOL) suppository 650 mg, 650 mg, Rectal, Q6H PRN, Algernon Huxley, MD ?  acidophilus (RISAQUAD) capsule 2 capsule, 2 capsule, Oral, TID, Dew, Erskine Squibb, MD, 2 capsule at 04/24/21 9833 ?  albuterol (PROVENTIL) (2.5 MG/3ML) 0.083% nebulizer solution 2.5 mg, 2.5 mg, Nebulization, Q6H PRN, Lucky Cowboy, Erskine Squibb, MD ?  alteplase (CATHFLO ACTIVASE) injection 2 mg, 2 mg, Intracatheter, Once PRN, Algernon Huxley, MD ?  alum & mag hydroxide-simeth (MAALOX/MYLANTA) 200-200-20 MG/5ML suspension 15 mL, 15 mL, Oral, Q6H PRN, Algernon Huxley, MD, 15 mL at 04/23/21 1512 ?  azithromycin (ZITHROMAX) tablet 500 mg, 500 mg, Oral, QHS, Dew, Erskine Squibb, MD, 500 mg at 04/23/21 2144 ?  budesonide (PULMICORT) nebulizer solution 0.5 mg, 0.5 mg, Nebulization, Q6H PRN, Algernon Huxley, MD ?  calcium carbonate (TUMS - dosed in mg elemental calcium) chewable tablet 200 mg of elemental calcium, 1 tablet, Oral, TID PRN, Lucky Cowboy, Erskine Squibb, MD ?  Chlorhexidine Gluconate Cloth 2 % PADS 6 each, 6 each, Topical, Daily, Algernon Huxley, MD, 6 each at 04/24/21 0827 ?   diltiazem (CARDIZEM) tablet 60 mg, 60 mg, Oral, Q6H, Enzo Bi, MD, 60 mg at 04/24/21 1423 ?  guaiFENesin (MUCINEX) 12 hr tablet 600 mg, 600 mg, Oral, BID, Dew, Erskine Squibb, MD, 600 mg at 04/24/21 8250 ?  heparin injection 1,000 Units, 1,000 Units, Dialysis, PRN, Algernon Huxley, MD ?  heparin injection 5,000 Units, 5,000 Units, Subcutaneous, Q8H, Enzo Bi, MD, 5,000 Units at 04/24/21 1424 ?  heparin sodium (porcine) 1000 UNIT/ML injection, , , ,  ?  insulin aspart (novoLOG) injection 0-5 Units, 0-5 Units, Subcutaneous, QHS, Dew, Erskine Squibb, MD ?  insulin aspart (novoLOG) injection 0-9 Units, 0-9 Units, Subcutaneous, TID WC, Algernon Huxley, MD, 2 Units at 04/22/21 [phone] ?  levothyroxine (SYNTHROID) tablet 25 mcg, 25 mcg, Oral, Q0600, Dew, Erskine Squibb, MD, 25 mcg at  04/24/21 0543 ?  lidocaine (PF) (XYLOCAINE) 1 % injection 5 mL, 5 mL, Intradermal, PRN, Lucky Cowboy, Erskine Squibb, MD ?  lidocaine-prilocaine (EMLA) cream 1 application., 1 application., Topical, PRN, Algernon Huxley, MD ?  metoprolol succinate (TOPROL-XL) 24 hr tablet 50 mg, 50 mg, Oral, Daily, Dew, Erskine Squibb, MD, 50 mg at 04/24/21 4383 ?  metoprolol tartrate (LOPRESSOR) injection 5 mg, 5 mg, Intravenous, Q2H PRN, Lucky Cowboy, Erskine Squibb, MD ?  multivitamin (RENA-VIT) tablet 1 tablet, 1 tablet, Oral, QHS, Enzo Bi, MD, 1 tablet at 04/23/21 2144 ?  ondansetron (ZOFRAN) tablet 4 mg, 4 mg, Oral, Q6H PRN **OR** ondansetron (ZOFRAN) injection 4 mg, 4 mg, Intravenous, Q6H PRN, Algernon Huxley, MD, 4 mg at 04/22/21 1036 ?  ondansetron (ZOFRAN) injection 4 mg, 4 mg, Intravenous, Q6H PRN, Algernon Huxley, MD, 4 mg at 04/23/21 0546 ?  pentafluoroprop-tetrafluoroeth (GEBAUERS) aerosol 1 application., 1 application., Topical, PRN, Algernon Huxley, MD ?  prochlorperazine (COMPAZINE) injection 10 mg, 10 mg, Intravenous, Q6H PRN, Enzo Bi, MD, 10 mg at 04/23/21 1919 ? ? ? ?ALLERGIES  ? ?Codeine ? ? ? ? ?REVIEW OF SYSTEMS  ? ? ?Review of Systems: ? ?Gen:  Denies  fever, sweats, chills weigh loss  ?HEENT: Denies blurred  vision, double vision, ear pain, eye pain, hearing loss, nose bleeds, sore throat ?Cardiac:  No dizziness, chest pain or heaviness, chest tightness,edema ?Resp:   reports dyspnea chronically  ?Gi: Denies swal

## 2021-04-24 NOTE — Progress Notes (Signed)
PT Cancellation Note ? ?Patient Details ?Name: Blake West ?MRN: [id number] ?DOB: 08-29-1950 ? ? ?Cancelled Treatment:    Reason Eval/Treat Not Completed: Patient at procedure or test/unavailable Patient's chart reviewed, and spoke with RN. Due to patient being off the floor at dialysis, will hold on PT at this time, and will re-attempt at a later time/date as available and patient medically appropriate for PT. Thank you!  ? ?Iva Boop, PT  ?04/24/21. 11:26 AM ? ?

## 2021-04-24 NOTE — Progress Notes (Signed)
?Progress Note ? ? ?Patient: Blake West KWI:[id number] DOB: November 05, 1950 DOA: 04/19/2021     5 ?DOS: the patient was seen and examined on 04/24/2021 ?  ?Brief hospital course: ?[name] is a 71 year old male with history of biventricular heart failure, hypertension, gout, chronic heart failure reduced ejection fraction, history of glomerulonephritis, CKD stage IV, who presents emergency department from outpatient clinic for chief concerns of renal failure.  Patient has been followed by nephrology in the office, recommended work-up for vasculitis, but patient has not been interested per Dr. Candiss Norse. ?Patient has been having coughing and shortness of breath for 2 weeks, nausea, vomiting and diarrhea for 1 week. ?Upon arriving the hospital, patient had a serum creatinine of 15, bicarb 8, potassium 6.8.  Chest CT scan showed scattered nodular groundglass airspace disease. ?Patient is treated with bicarb drip, Zithromax, vancomycin and cefepime. ? ? ? ?Assessment and Plan: ?* Acute renal failure superimposed on stage 4 chronic kidney disease ([place]) ?--Cr 15 on presentation, BUN 247. ?Patient had a chronic kidney disease stage IV, etiology still unclear.  Worsening renal function due to dehydration from diarrhea. ?Patient also has significant hyperkalemia and metabolic acidosis.   ?--there was also concern for vasculitis ?--dialysis cath placement and started dialysis on 3/30 ?Plan: ?--vasculitis workup per nephro ?--Continued on IV Solu-Medrol x3 days, switch over to oral prednisone subsequently, per nephro ?--plan on kidney biopsy early next week after uremia improves ?--Plan for rituximab based on biopsy results ? ?Acute hypoxemic respiratory failure ([place]) ?--developed overnight, hypoxic requiring 4L O2.  Likely due to fluid overload from bicarb gtt. ?Plan: ?--dialysis for fluid removal ? ? ?Malnutrition of moderate degree ?--supplements per dietician  ? ?High anion gap metabolic acidosis ?This is secondary to acute  on chronic renal failure.  ?--s/p Na bicarb gtt ? ? ?Chronic atrial fibrillation with RVR ([facility]) ?--increase Toprol to 100 mg daily  ?--cont dilt 60 q6h ?--Will consider anticoagulation after renal function improves. ? ?Multifocal pneumonia ?Mild elevation of procalcitonin level in the setting of acute renal failure.  MRSA culture negative, discontinued vancomycin.   ?--completed Rocephin and Zithromax for total 5 day abx ? ?Hyperkalemia ?Potassium has normalized.  This is secondary to acute kidney injury as well as severe metabolic acidosis. ? ?Diarrhea ?Patient has been having diarrhea for 1 week, but none since presentation, so C diff and GI path could not be collected. ? ?Severe sepsis with acute organ dysfunction ([place]) ?Patient has severe sepsis with tachycardia, tachypnea, leukocytosis and a severe renal failure.  This is most likely due to pneumonia. ?He has received IV fluids.  ? ? ?Chronic HFrEF (heart failure with reduced ejection fraction) ([place]) ?Acute CHF ruled out. ?Reviewed echocardiogram performed/2021, ejection fraction 20 to 25%. ?Patient was volume depleted at time of admission, no exacerbation of congestive heart failure.  ?Patient was not on evidence-based congestive heart failure treatment. ?--increase Toprol to 100 mg daily  ? ?Hypothyroidism ?Continue Synthroid. ? ?Hyperlipidemia ?Not currently treated. ? ?Essential hypertension ?--increase Toprol to 100 mg daily  ?--cont dilt 60 q6h ? ? ? ? ?  ? ?Subjective:  ?Per wife, pt was agitated last night and wife thought it was due to steroid and medications. ? ?Pt continued to not want to eat due to things tasting too salty or too sweet. ? ? ?Physical Exam: ? ?Constitutional: NAD, alert, seems more confused ?HEENT: conjunctivae and lids normal, EOMI ?CV: No cyanosis.   ?RESP: normal respiratory effort, on 3L ?SKIN: warm, dry ? ? ?Data  Reviewed: ? ?Family Communication: wife and daughter updated at bedside today ? ?Disposition: ?Status is:  Inpatient ?Remains inpatient appropriate because: starting dialysis, pending kidney biopsy ? ? Planned Discharge Destination: Home  family declined SNF ? ? ? ?Time spent: 50 minutes ? ?Author: ?Enzo Bi, MD ?04/24/2021 5:37 PM ? ?For on call review [email or web address].  ?

## 2021-04-25 DIAGNOSIS — R338 Other retention of urine: Secondary | ICD-10-CM

## 2021-04-25 DIAGNOSIS — N184 Chronic kidney disease, stage 4 (severe): Secondary | ICD-10-CM | POA: Diagnosis not present

## 2021-04-25 DIAGNOSIS — R410 Disorientation, unspecified: Secondary | ICD-10-CM

## 2021-04-25 DIAGNOSIS — N170 Acute kidney failure with tubular necrosis: Secondary | ICD-10-CM | POA: Diagnosis not present

## 2021-04-25 HISTORY — DX: Other retention of urine: R33.8

## 2021-04-25 HISTORY — DX: Disorientation, unspecified: R41.0

## 2021-04-25 LAB — CBC
HCT: 28.9 % — ABNORMAL LOW (ref 39.0–52.0)
Hemoglobin: 9.5 g/dL — ABNORMAL LOW (ref 13.0–17.0)
MCH: 28.3 pg (ref 26.0–34.0)
MCHC: 32.9 g/dL (ref 30.0–36.0)
MCV: 86 fL (ref 80.0–100.0)
Platelets: 163 10*3/uL (ref 150–400)
RBC: 3.36 MIL/uL — ABNORMAL LOW (ref 4.22–5.81)
RDW: 14 % (ref 11.5–15.5)
WBC: 12.8 10*3/uL — ABNORMAL HIGH (ref 4.0–10.5)
nRBC: 0 % (ref 0.0–0.2)

## 2021-04-25 LAB — BASIC METABOLIC PANEL
Anion gap: 15 (ref 5–15)
BUN: 89 mg/dL — ABNORMAL HIGH (ref 8–23)
CO2: 25 mmol/L (ref 22–32)
Calcium: 8.5 mg/dL — ABNORMAL LOW (ref 8.9–10.3)
Chloride: 95 mmol/L — ABNORMAL LOW (ref 98–111)
Creatinine, Ser: 6.16 mg/dL — ABNORMAL HIGH (ref 0.61–1.24)
GFR, Estimated: 9 mL/min — ABNORMAL LOW (ref >60)
Glucose, Bld: 159 mg/dL — ABNORMAL HIGH (ref 70–99)
Potassium: 3.7 mmol/L (ref 3.5–5.1)
Sodium: 135 mmol/L (ref 135–145)

## 2021-04-25 LAB — GLUCOSE, CAPILLARY
Glucose-Capillary: 140 mg/dL — ABNORMAL HIGH (ref 70–99)
Glucose-Capillary: 172 mg/dL — ABNORMAL HIGH (ref 70–99)
Glucose-Capillary: 188 mg/dL — ABNORMAL HIGH (ref 70–99)
Glucose-Capillary: 218 mg/dL — ABNORMAL HIGH (ref 70–99)

## 2021-04-25 LAB — MAGNESIUM: Magnesium: 2.4 mg/dL (ref 1.7–2.4)

## 2021-04-25 MED ORDER — ORAL CARE MOUTH RINSE
15.0000 mL | Freq: Two times a day (BID) | OROMUCOSAL | Status: DC
  Administered 2021-04-26 – 2021-04-30 (×9): 15 mL via OROMUCOSAL

## 2021-04-25 MED ORDER — SODIUM CHLORIDE 0.9 % IV SOLN
1000.0000 mg | Freq: Every day | INTRAVENOUS | Status: DC
  Administered 2021-04-25 – 2021-04-27 (×3): 1000 mg via INTRAVENOUS
  Filled 2021-04-25 (×3): qty 16

## 2021-04-25 MED ORDER — TRAZODONE HCL 50 MG PO TABS
50.0000 mg | ORAL_TABLET | Freq: Once | ORAL | Status: AC
  Administered 2021-04-25: 50 mg via ORAL
  Filled 2021-04-25: qty 1

## 2021-04-25 MED ORDER — QUETIAPINE FUMARATE 25 MG PO TABS
100.0000 mg | ORAL_TABLET | Freq: Every day | ORAL | Status: DC
  Administered 2021-04-25 – 2021-04-29 (×5): 100 mg via ORAL
  Filled 2021-04-25 (×5): qty 4

## 2021-04-25 NOTE — Progress Notes (Signed)
?[facility]  ?ROUNDING NOTE  ? ?Subjective:  ? 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  Patient presents to the emergency department with complaints of nausea, vomiting, generalized decline.  Patient has been admitted for Hyperkalemia [E87.5] ?Atrial fibrillation with RVR ([place]) [I48.91] ?Severe sepsis with acute organ dysfunction ([place]) [A41.9, R65.20] ?Acute renal failure, unspecified acute renal failure type ([place]) [N17.9] ? ? ?Update: ?Patient seen laying in bed, alert and oriented to self only ?Safety sitter at bedside ?Per nursing note patient unable to sleep during the night.  Reports patient confused and agitated, pulling at telemetry leads and Foley catheter. ?Patient continues to complain of discomfort from Foley requesting it be removed. ? ?BUN 89 from 126 ?Creatinine 6.16 ? ?Objective:  ?Vital signs in last 24 hours:  ?Temp:  [97.9 ?F (36.6 ?C)-98.8 ?F (37.1 ?C)] 98 ?F (36.7 ?C) (04/02 0710) ?Pulse Rate:  [46-136] 97 (04/02 0710) ?Resp:  [15-20] 19 (04/02 0710) ?BP: (114-148)/(74-98) 123/81 (04/02 0710) ?SpO2:  [89 %-100 %] 90 % (04/02 0710) ?Weight:  [83.6 kg] 83.6 kg (04/01 1235) ? ?Weight change: -0.2 kg ?Filed Weights  ? 04/23/21 1021 04/24/21 0957 04/24/21 1235  ?Weight: 84.1 kg 84.7 kg 83.6 kg  ? ? ?Intake/Output: ?I/O last 3 completed shifts: ?In: 120 [P.O.:120] ?Out: 277 [Urine:950; Other:8] ?  ?Intake/Output this shift: ? Total I/O ?In: -  ?Out: 400 [Urine:400] ? ?Physical Exam: ?General: NAD, restless  ?Head: Normocephalic, atraumatic.  Dry oral mucosal membranes  ?Eyes: Anicteric  ?Lungs:  Clear to auscultation, normal effort, [place] O2  ?Heart: Regular rate and rhythm  ?Abdomen:  Soft, nontender, nondistended  ?Extremities: No peripheral edema.  ?Neurologic: Alert, oriented to self only,  moving all four extremities  ?Skin: No lesions  ?Access: Rt permcath placed on 04/22/21 by Dr Lucky Cowboy  ? ? ?Basic Metabolic Panel: ?Recent Labs  ?Lab 04/20/21 ?8242 04/21/21 ?0434 04/22/21 ?3536 04/23/21 ?1443 04/24/21 ?1540 04/25/21 ?0867  ?NA 139 139 139 139 139 135  ?K 5.1 4.2 4.8 3.6 3.8 3.7  ?CL 106 103 102 96* 96* 95*  ?CO2 9* 14* 12* 24 26 25   ?GLUCOSE 119* 101* 171* 200* 174* 159*  ?BUN 195* 220* 159* 156* 126* 89*  ?CREATININE 14.69* 13.87* 14.09* 10.24* 8.04* 6.16*  ?CALCIUM 8.7* 8.1* 8.1* 8.1* 8.5* 8.5*  ?MG 2.9*  --  2.9* 2.6* 2.5* 2.4  ?PHOS 13.2*  --   --   --   --   --   ? ? ? ?Liver Function Tests: ?Recent Labs  ?Lab 04/19/21 ?2110 04/21/21 ?0434  ?AST 11* 12*  ?ALT 9 10  ?ALKPHOS 68 48  ?BILITOT 1.0 1.3*  ?PROT 8.1 6.2*  ?ALBUMIN 3.7 3.0*  ? ? ?Recent Labs  ?Lab 04/19/21 ?2110  ?LIPASE 88*  ? ? ?No results for input(s): AMMONIA in the last 168 hours. ? ?CBC: ?Recent Labs  ?Lab 04/19/21 ?2110 04/20/21 ?6195 04/21/21 ?0932 04/22/21 ?6712 04/23/21 ?4580 04/24/21 ?9983 04/25/21 ?3825  ?WBC 15.7* 14.4* 13.1* 7.0 12.2* 13.0* 12.8*  ?NEUTROABS 14.3* 11.4* 10.3*  --   --   --   --   ?HGB 13.3 10.6* 10.1* 10.0* 10.0* 10.4* 9.5*  ?HCT 40.5 32.0* 29.6* 30.2* 29.0* 31.1* 28.9*  ?MCV 86.9 87.0 84.3 84.8 83.8 86.9 86.0  ?PLT 267 221 205 197 187 176 163  ? ? ? ?Cardiac Enzymes: ?No  results for input(s): CKTOTAL, CKMB, CKMBINDEX, TROPONINI in the last 168 hours. ? ?BNP: ?Invalid input(s): POCBNP ? ?CBG: ?Recent Labs  ?Lab 04/23/21 ?1659 04/23/21 ?2133 04/24/21 ?0746 04/24/21 ?1657 04/24/21 ?2120  ?GLUCAP 151* [place] ? ? ?Microbiology: ?Results for orders placed or performed during the hospital encounter of 04/19/21  ?Resp Panel by RT-PCR (Flu A&B, Covid) Nasopharyngeal Swab     Status: None  ? Collection Time: 04/19/21  9:14 PM  ? Specimen: Nasopharyngeal Swab; Nasopharyngeal(NP) swabs in vial transport medium  ?Result Value Ref Range Status  ? SARS Coronavirus 2 by RT PCR NEGATIVE NEGATIVE Final  ?   Comment: (NOTE) ?SARS-CoV-2 target nucleic acids are NOT DETECTED. ? ?The SARS-CoV-2 RNA is generally detectable in upper respiratory ?specimens during the acute phase of infection. The lowest ?concentration of SARS-CoV-2 viral copies this assay can detect is ?138 copies/mL. A negative result does not preclude SARS-Cov-2 ?infection and should not be used as the sole basis for treatment or ?other patient management decisions. A negative result may occur with  ?improper specimen collection/handling, submission of specimen other ?than nasopharyngeal swab, presence of viral mutation(s) within the ?areas targeted by this assay, and inadequate number of viral ?copies(<138 copies/mL). A negative result must be combined with ?clinical observations, patient history, and epidemiological ?information. The expected result is Negative. ? ?Fact Sheet for Patients:  ?EntrepreneurPulse.com.au ? ?Fact Sheet for Healthcare Providers:  ?IncredibleEmployment.be ? ?This test is no t yet approved or cleared by the [place] FDA and  ?has been authorized for detection and/or diagnosis of SARS-CoV-2 by ?FDA under an Emergency Use Authorization (EUA). This EUA will remain  ?in effect (meaning this test can be used) for the duration of the ?COVID-19 declaration under Section 564(b)(1) of the Act, 21 ?U.S.C.section 360bbb-3(b)(1), unless the authorization is terminated  ?or revoked sooner.  ? ? ?  ? Influenza A by PCR NEGATIVE NEGATIVE Final  ? Influenza B by PCR NEGATIVE NEGATIVE Final  ?  Comment: (NOTE) ?The Xpert Xpress SARS-CoV-2/FLU/RSV plus assay is intended as an aid ?in the diagnosis of influenza from Nasopharyngeal swab specimens and ?should not be used as a sole basis for treatment. Nasal washings and ?aspirates are unacceptable for Xpert Xpress SARS-CoV-2/FLU/RSV ?testing. ? ?Fact Sheet for Patients: ?EntrepreneurPulse.com.au ? ?Fact Sheet for Healthcare  Providers: ?IncredibleEmployment.be ? ?This test is not yet approved or cleared by the [place] FDA and ?has been authorized for detection and/or diagnosis of SARS-CoV-2 by ?FDA under an Emergency Use Authorization (EUA). This EUA will remain ?in effect (meaning this test can be used) for the duration of the ?COVID-19 declaration under Section 564(b)(1) of the Act, 21 U.S.C. ?section 360bbb-3(b)(1), unless the authorization is terminated or ?revoked. ? ?Performed at [facility], [place], ?[place] [address] ?  ?Culture, blood (routine x 2)     Status: None  ? Collection Time: 04/19/21  9:45 PM  ? Specimen: BLOOD  ?Result Value Ref Range Status  ? Specimen Description BLOOD RIGHT ANTECUBITAL  Final  ? Special Requests   Final  ?  BOTTLES DRAWN AEROBIC AND ANAEROBIC Blood Culture adequate volume  ? Culture   Final  ?  NO GROWTH 5 DAYS ?Performed at [facility], [address] [place], [place] [address] ?  ? Report Status 04/24/2021 FINAL  Final  ?Culture, blood (routine x 2)     Status: None  ? Collection Time: 04/19/21 10:32 PM  ? Specimen: BLOOD  ?Result  Value Ref Range Status  ? Specimen Description BLOOD LEFT ASSIST CONTROL  Final  ? Special Requests   Final  ?  BOTTLES DRAWN AEROBIC AND ANAEROBIC Blood Culture adequate volume  ? Culture   Final  ?  NO GROWTH 5 DAYS ?Performed at [facility], [address] [place], [place] [address] ?  ? Report Status 04/24/2021 FINAL  Final  ?MRSA Next Gen by PCR, Nasal     Status: None  ? Collection Time: 04/20/21  2:21 AM  ? Specimen: Nasal Mucosa; Nasal Swab  ?Result Value Ref Range Status  ? MRSA by PCR Next Gen NOT DETECTED NOT DETECTED Final  ?  Comment: (NOTE) ?The GeneXpert MRSA Assay (FDA approved for NASAL specimens only), ?is one component of a comprehensive MRSA colonization surveillance ?program. It is not intended to diagnose MRSA infection nor to guide ?or monitor treatment for MRSA  infections. ?Test performance is not FDA approved in patients less than 2 years ?old. ?Performed at [facility], [place], ?[place] [address] ?  ?Urine Culture     Status: None  ? Collection Time: 04/20/21

## 2021-04-25 NOTE — Progress Notes (Signed)
?Progress Note ? ? ?Patient: Blake West KWI:[id number] DOB: 1950/08/24 DOA: 04/19/2021     6 ?DOS: the patient was seen and examined on 04/25/2021 ?  ?Brief hospital course: ?[name] is a 71 year old male with history of biventricular heart failure, hypertension, gout, chronic heart failure reduced ejection fraction, history of glomerulonephritis, CKD stage IV, who presents emergency department from outpatient clinic for chief concerns of renal failure.  Patient has been followed by nephrology in the office, recommended work-up for vasculitis, but patient has not been interested per Dr. Candiss West. ?Patient has been having coughing and shortness of breath for 2 weeks, nausea, vomiting and diarrhea for 1 week. ?Upon arriving the hospital, patient had a serum creatinine of 15, bicarb 8, potassium 6.8.  Chest CT scan showed scattered nodular groundglass airspace disease. ?Patient is treated with bicarb drip, Zithromax, vancomycin and cefepime. ? ? ? ?Assessment and Plan: ?* Acute renal failure superimposed on stage 4 chronic kidney disease ([place]) ?--Cr 15 on presentation, BUN 247. ?Patient had a chronic kidney disease stage IV, etiology still unclear.  Worsening renal function due to dehydration from diarrhea. ?Patient also has significant hyperkalemia and metabolic acidosis.   ?--there was also concern for vasculitis ?--dialysis cath placement and started dialysis on 3/30 ?Plan: ?--vasculitis workup per nephro ?--Continued on IV Solu-Medrol x3 days, switch over to oral prednisone subsequently, per nephro ?--plan on kidney biopsy early next week after uremia improves ?--Plan for rituximab based on biopsy results ? ?Delirium ?Hospital Delirium ?--started to get agitated and combative starting yesterday.  Also noted to not sleep during the night. ?Plan: ?--increase seroquel to 100 mg nightly ?--IV haldol PRN ? ?Acute urinary retention ?--Pt had a foley inserted on presentation, with indication of acute urinary  retention. ?Plan: ?--d/c foley today for a voiding trial. ? ?Acute hypoxemic respiratory failure ([place]) ?--developed overnight, hypoxic requiring 4L O2.  Likely due to fluid overload from bicarb gtt. ?Plan: ?--dialysis for fluid removal ? ? ?Malnutrition of moderate degree ?--supplements per dietician  ? ?High anion gap metabolic acidosis ?This is secondary to acute on chronic renal failure.  ?--s/p Na bicarb gtt ? ? ?Chronic atrial fibrillation with RVR ([facility]) ?--cont Toprol to 100 mg daily  ?--cont dilt 60 q6h ?--Will consider anticoagulation after renal function improves. ? ?Multifocal pneumonia ?Mild elevation of procalcitonin level in the setting of acute renal failure.  MRSA culture negative, discontinued vancomycin.   ?--completed Rocephin and Zithromax for total 5 day abx ? ?Hyperkalemia ?Potassium has normalized.  This is secondary to acute kidney injury as well as severe metabolic acidosis. ? ?Diarrhea ?Patient has been having diarrhea for 1 week, but none since presentation, so C diff and GI path could not be collected. ? ?Severe sepsis with acute organ dysfunction ([place]) ?Patient has severe sepsis with tachycardia, tachypnea, leukocytosis and a severe renal failure.  This is most likely due to pneumonia. ?He has received IV fluids.  ? ? ?Chronic HFrEF (heart failure with reduced ejection fraction) ([place]) ?Acute CHF ruled out. ?Reviewed echocardiogram performed/2021, ejection fraction 20 to 25%. ?Patient was volume depleted at time of admission, no exacerbation of congestive heart failure.  ?Patient was not on evidence-based congestive heart failure treatment. ?--cont Toprol to 100 mg daily  ? ?Hypothyroidism ?Continue Synthroid. ? ?Hyperlipidemia ?Not currently treated. ? ?Essential hypertension ?--cont Toprol 100 mg daily  ?--cont dilt 60 q6h ? ? ? ? ?  ? ?Subjective:  ?Per RN, pt was agitated all night, despite seroquel, IV haldol, trazodone.   ? ?  This morning, pt was calm, and appeared to be eating more  now. ? ? ?Physical Exam: ? ?Constitutional: NAD, alert, eating ?HEENT: conjunctivae and lids normal, EOMI ?CV: No cyanosis.   ?RESP: normal respiratory effort, on 3L ?Neuro: II - XII grossly intact.   ? ? ?Data Reviewed: ? ?Family Communication: wife updated at bedside today ? ?Disposition: ?Status is: Inpatient ?Remains inpatient appropriate because: starting dialysis, pending kidney biopsy ? ? Planned Discharge Destination: Home  family declined SNF ? ? ? ?Time spent: 50 minutes ? ?Author: ?Blake Bi, MD ?04/25/2021 1:39 PM ? ?For on call review [email or web address].  ?

## 2021-04-25 NOTE — Assessment & Plan Note (Addendum)
--  Pt had a foley inserted on presentation, with indication of acute urinary retention. ?--Foley d/c'ed on 4/2, and pt able to avoid ?

## 2021-04-25 NOTE — Progress Notes (Signed)
? ? ? ?PULMONOLOGY ? ? ? ? ? ? ? ? ?Date: 04/25/2021,   ?MRN# [id number] Blake West 1950-09-20 ? ? ?  ?AdmissionWeight: 83 kg                 ?CurrentWeight: 83.6 kg ? ?Referring provider: Dr Candiss Norse ? ? ?CHIEF COMPLAINT:  ? ?Anca + vasculitis  ? ? ?HISTORY OF PRESENT ILLNESS  ? ?This is a pleasant 71 year old male with a history of CHF with reduced EF, coronary disease gout, dyslipidemia essential hypertension, hypothyroidism testicular dysfunction, type 2 diabetes, history of glomerulonephritis with CKD stage IV came in from outpatient clinic due to provider concerns for ongoing progressive renal failure.  Patient is essentially a lifelong non-smoker reporting smoking in the 1950s.  He does report mild and subacute and chronic cough with expectoration of whitish phlegm.  Patient reports decreased appetite as well as loose stools for less than 1 week prior to arrival.  In the emergency department patient had accelerated hypertension with tachycardia however normoxia on room air.  His initial creatinine was 15 with a GFR of 3 remainder of blood work was essentially unremarkable besides electrolyte derangements including potassium 6.8 without EKG changes.  He was negative for COVID influenza a and B on arrival.  He had chest imaging done without contrast which showed nodular groundglass airspace opacification of the lung.  There was concern for pneumonia and patient is empirically treated with cefepime and vancomycin and Zithromax on arrival.  Also received Lokelma for elevated potassium.  Spinal this patient was lucid without altered mentation.  CT imaging with lung windows were independently reviewed by me with findings of chronic bronchitic changes as well as punctate multifocal bilateral groundglass nodules these findings are nonspecific and can be seen with atypical infection, viral lower respiratory tract infection as well as inflammatory lung disease. ? ? ?04/23/21- patient resting in bed with family at bedside  during my evaluation. Patient is in no distress with nasal canula off his face normoxic on room air. He ate today with better appetite and was able to get up OOB to bathroom.  Family shares he seems to be feeling better overall. Wife shares the right clavicle oppposite side of trialysis catheter seems to appear different then usual and requests imaging to be done.  I have ordered CXR.  Noted CRP with moderate elevation. Infectious workup in progress , blood work reviewed with improved GFR post HD, mild hyperglycemia and leukocytosis post pulse dose steriods, no overnight events. Appreciate everyone caring for this patient. No changes to therapy from pulm perspective.  ? ?04/24/21- patient is stable no overnight events. Repeat chest x ray with mild atelectasis.  Recommend use of incentive spirometer when able.  ? ?04/25/21-patient seen and examined at bedside.  Renal function progressively improving with dialysis.  Mental status is lucid and appropriate.  Patient had some agitation  was rude to wife and she was tearful since then he has been treated with Seroquel with subsequent improvement. They both are in good spirits now. He is eating, had normal BM and is making urine.  ? ?PAST MEDICAL HISTORY  ? ?Past Medical History:  ?Diagnosis Date  ? Abnormal EKG   ? Biventricular failure ([place])   ? CAD (coronary artery disease)   ? Colorectal polyps   ? Gout   ? Hyperlipidemia LDL goal <70   ? Hypertension   ? Hypothyroidism   ? Kidney stone   ? Pneumonia   ? Testicular hypofunction   ?  Type 2 diabetes mellitus ([place]) 04/26/2019  ? ? ? ?SURGICAL HISTORY  ? ?Past Surgical History:  ?Procedure Laterality Date  ? CARDIAC CATHETERIZATION    ? DIALYSIS/PERMA CATHETER INSERTION N/A 04/22/2021  ? Procedure: DIALYSIS/PERMA CATHETER INSERTION;  Surgeon: Algernon Huxley, MD;  Location: [facility];  Service: Cardiovascular;  Laterality: N/A;  ? FOOT SURGERY    ? RIGHT/LEFT HEART CATH AND CORONARY ANGIOGRAPHY N/A 04/30/2019  ? Procedure:  RIGHT/LEFT HEART CATH AND CORONARY ANGIOGRAPHY;  Surgeon: Troy Sine, MD;  Location: [facility];  Service: Cardiovascular;  Laterality: N/A;  ? ? ? ?FAMILY HISTORY  ? ?Family History  ?Problem Relation Age of Onset  ? Heart attack Mother 81  ? Pulmonary fibrosis Father   ? Congestive Heart Failure Brother   ? Heart attack Brother   ? Heart attack Brother   ? Early death Sister   ? Kidney disease Paternal Grandfather   ? Diabetes Sister   ? ? ? ?SOCIAL HISTORY  ? ?Social History  ? ?Tobacco Use  ? Smoking status: Former  ?  Packs/day: 1.00  ?  Years: 30.00  ?  Pack years: 30.00  ?  Types: Cigarettes  ?  Quit date: 01/24/2002  ?  Years since quitting: 19.2  ? Smokeless tobacco: Never  ?Vaping Use  ? Vaping Use: Never used  ?Substance Use Topics  ? Alcohol use: No  ? Drug use: No  ? ? ? ?MEDICATIONS  ? ? ?Home Medication:  ?  ?Current Medication: ? ?Current Facility-Administered Medications:  ?  (feeding supplement) PROSource Plus liquid 30 mL, 30 mL, Oral, TID BM, Enzo Bi, MD, 30 mL at 04/24/21 2000 ?  0.9 %  sodium chloride infusion, 100 mL, Intravenous, PRN, Lucky Cowboy, Erskine Squibb, MD ?  0.9 %  sodium chloride infusion, 100 mL, Intravenous, PRN, Lucky Cowboy, Erskine Squibb, MD ?  acetaminophen (TYLENOL) tablet 650 mg, 650 mg, Oral, Q6H PRN, 650 mg at 04/23/21 1814 **OR** acetaminophen (TYLENOL) suppository 650 mg, 650 mg, Rectal, Q6H PRN, Lucky Cowboy, Erskine Squibb, MD ?  acidophilus (RISAQUAD) capsule 2 capsule, 2 capsule, Oral, TID, Lucky Cowboy, Erskine Squibb, MD, 2 capsule at 04/25/21 1550 ?  albuterol (PROVENTIL) (2.5 MG/3ML) 0.083% nebulizer solution 2.5 mg, 2.5 mg, Nebulization, Q6H PRN, Lucky Cowboy, Erskine Squibb, MD ?  alteplase (CATHFLO ACTIVASE) injection 2 mg, 2 mg, Intracatheter, Once PRN, Algernon Huxley, MD ?  alum & mag hydroxide-simeth (MAALOX/MYLANTA) 200-200-20 MG/5ML suspension 15 mL, 15 mL, Oral, Q6H PRN, Algernon Huxley, MD, 15 mL at 04/23/21 1512 ?  budesonide (PULMICORT) nebulizer solution 0.5 mg, 0.5 mg, Nebulization, Q6H PRN, Lucky Cowboy, Erskine Squibb, MD ?   calcium carbonate (TUMS - dosed in mg elemental calcium) chewable tablet 200 mg of elemental calcium, 1 tablet, Oral, TID PRN, Lucky Cowboy, Erskine Squibb, MD ?  Chlorhexidine Gluconate Cloth 2 % PADS 6 each, 6 each, Topical, Daily, Dew, Erskine Squibb, MD, 6 each at 04/25/21 0839 ?  diltiazem (CARDIZEM) tablet 60 mg, 60 mg, Oral, Q6H, Enzo Bi, MD, 60 mg at 04/25/21 1819 ?  guaiFENesin (MUCINEX) 12 hr tablet 600 mg, 600 mg, Oral, BID, Algernon Huxley, MD, 600 mg at 04/25/21 7867 ?  haloperidol lactate (HALDOL) injection 2 mg, 2 mg, Intravenous, Q6H PRN, Enzo Bi, MD, 2 mg at 04/25/21 0416 ?  heparin injection 1,000 Units, 1,000 Units, Dialysis, PRN, Algernon Huxley, MD ?  insulin aspart (novoLOG) injection 0-5 Units, 0-5 Units, Subcutaneous, QHS, Dew, Erskine Squibb, MD ?  insulin  aspart (novoLOG) injection 0-9 Units, 0-9 Units, Subcutaneous, TID WC, Algernon Huxley, MD, 2 Units at 04/22/21 [phone] ?  levothyroxine (SYNTHROID) tablet 25 mcg, 25 mcg, Oral, Q0600, Algernon Huxley, MD, 25 mcg at 04/25/21 0416 ?  lidocaine (PF) (XYLOCAINE) 1 % injection 5 mL, 5 mL, Intradermal, PRN, Lucky Cowboy, Erskine Squibb, MD ?  lidocaine-prilocaine (EMLA) cream 1 application., 1 application., Topical, PRN, Algernon Huxley, MD ?  Derrill Memo ON 04/26/2021] MEDLINE mouth rinse, 15 mL, Mouth Rinse, BID, Enzo Bi, MD ?  methylPREDNISolone sodium succinate (SOLU-MEDROL) 1,000 mg in sodium chloride 0.9 % 50 mL IVPB, 1,000 mg, Intravenous, Daily, Breeze, Shantelle, NP, Last Rate: 66 mL/hr at 04/25/21 1552, 1,000 mg at 04/25/21 1552 ?  metoprolol succinate (TOPROL-XL) 24 hr tablet 100 mg, 100 mg, Oral, Daily, Enzo Bi, MD, 100 mg at 04/25/21 5848 ?  metoprolol tartrate (LOPRESSOR) injection 5 mg, 5 mg, Intravenous, Q2H PRN, Lucky Cowboy, Erskine Squibb, MD ?  multivitamin (RENA-VIT) tablet 1 tablet, 1 tablet, Oral, QHS, Enzo Bi, MD, 1 tablet at 04/24/21 2000 ?  ondansetron (ZOFRAN) tablet 4 mg, 4 mg, Oral, Q6H PRN **OR** ondansetron (ZOFRAN) injection 4 mg, 4 mg, Intravenous, Q6H PRN, Algernon Huxley, MD, 4 mg at  04/22/21 1036 ?  ondansetron (ZOFRAN) injection 4 mg, 4 mg, Intravenous, Q6H PRN, Algernon Huxley, MD, 4 mg at 04/23/21 0546 ?  pentafluoroprop-tetrafluoroeth (GEBAUERS) aerosol 1 application., 1 application., To

## 2021-04-25 NOTE — Progress Notes (Signed)
Patient has not slept all night. Has been agitated, confused, trying to get out of bed. Mitts were put on because he was pulling on foley and taking cardiac monitor off. While trying to take his mitts off he bit his left wrist and left a bruise. Safety rounder assistant has been with patient most of night reorienting to stay in bed. Patient was given Haldol 2 mg PRN IV twice and first dose of Seroquel was given as scheduled. NP Randol Kern ordered trazodone 50 mg and this was given but was not effective. Patient continues to try to get up from bed at this time. Has been Sinus tachycardia staying in the 100-110 with rest and spiking up to 120-130 with movement and restlessness.  ?

## 2021-04-25 NOTE — Assessment & Plan Note (Addendum)
Hospital Delirium ?--started to get agitated and combative.  Also noted to not sleep during the night.  Continue as needed Haldol nightly Seroquel.  Somewhat improved.  I suspect he likely has some underlying dementia given sundowning. ?

## 2021-04-26 ENCOUNTER — Inpatient Hospital Stay: Payer: PPO

## 2021-04-26 DIAGNOSIS — N184 Chronic kidney disease, stage 4 (severe): Secondary | ICD-10-CM | POA: Diagnosis not present

## 2021-04-26 DIAGNOSIS — N170 Acute kidney failure with tubular necrosis: Secondary | ICD-10-CM | POA: Diagnosis not present

## 2021-04-26 LAB — LEGIONELLA PNEUMOPHILA TOTAL AB: Legionella Pneumo Total Ab: <0.91 OD ratio (ref 0.00–0.90)

## 2021-04-26 LAB — GLUCOSE, CAPILLARY
Glucose-Capillary: 194 mg/dL — ABNORMAL HIGH (ref 70–99)
Glucose-Capillary: 152 mg/dL — ABNORMAL HIGH (ref 70–99)
Glucose-Capillary: 247 mg/dL — ABNORMAL HIGH (ref 70–99)
Glucose-Capillary: 245 mg/dL — ABNORMAL HIGH (ref 70–99)

## 2021-04-26 LAB — BASIC METABOLIC PANEL
Anion gap: 18 — ABNORMAL HIGH (ref 5–15)
BUN: 121 mg/dL — ABNORMAL HIGH (ref 8–23)
CO2: 21 mmol/L — ABNORMAL LOW (ref 22–32)
Calcium: 8.2 mg/dL — ABNORMAL LOW (ref 8.9–10.3)
Chloride: 93 mmol/L — ABNORMAL LOW (ref 98–111)
Creatinine, Ser: 7.54 mg/dL — ABNORMAL HIGH (ref 0.61–1.24)
GFR, Estimated: 7 mL/min — ABNORMAL LOW (ref >60)
Glucose, Bld: 176 mg/dL — ABNORMAL HIGH (ref 70–99)
Potassium: 3.5 mmol/L (ref 3.5–5.1)
Sodium: 132 mmol/L — ABNORMAL LOW (ref 135–145)

## 2021-04-26 LAB — QUANTIFERON-TB GOLD PLUS (RQFGPL)
QuantiFERON Mitogen Value: 0.6 IU/mL
QuantiFERON Nil Value: 0.02 IU/mL
QuantiFERON TB1 Ag Value: 0.04 IU/mL
QuantiFERON TB2 Ag Value: 0.01 IU/mL

## 2021-04-26 LAB — CBC
HCT: 27.1 % — ABNORMAL LOW (ref 39.0–52.0)
Hemoglobin: 9 g/dL — ABNORMAL LOW (ref 13.0–17.0)
MCH: 28.8 pg (ref 26.0–34.0)
MCHC: 33.2 g/dL (ref 30.0–36.0)
MCV: 86.6 fL (ref 80.0–100.0)
Platelets: 143 10*3/uL — ABNORMAL LOW (ref 150–400)
RBC: 3.13 MIL/uL — ABNORMAL LOW (ref 4.22–5.81)
RDW: 13.6 % (ref 11.5–15.5)
WBC: 10.8 10*3/uL — ABNORMAL HIGH (ref 4.0–10.5)
nRBC: 0 % (ref 0.0–0.2)

## 2021-04-26 LAB — PROTIME-INR
INR: 1 (ref 0.8–1.2)
Prothrombin Time: 13.4 seconds (ref 11.4–15.2)

## 2021-04-26 LAB — QUANTIFERON-TB GOLD PLUS: QuantiFERON-TB Gold Plus: NEGATIVE

## 2021-04-26 LAB — MAGNESIUM: Magnesium: 2.5 mg/dL — ABNORMAL HIGH (ref 1.7–2.4)

## 2021-04-26 MED ORDER — MIDAZOLAM HCL 2 MG/2ML IJ SOLN
INTRAMUSCULAR | Status: AC
  Filled 2021-04-26: qty 2

## 2021-04-26 MED ORDER — APIXABAN 5 MG PO TABS
5.0000 mg | ORAL_TABLET | Freq: Two times a day (BID) | ORAL | Status: DC
  Administered 2021-04-27 – 2021-04-30 (×7): 5 mg via ORAL
  Filled 2021-04-26 (×7): qty 1

## 2021-04-26 MED ORDER — FENTANYL CITRATE (PF) 100 MCG/2ML IJ SOLN
INTRAMUSCULAR | Status: AC | PRN
  Administered 2021-04-26: 25 ug via INTRAVENOUS

## 2021-04-26 MED ORDER — FENTANYL CITRATE (PF) 100 MCG/2ML IJ SOLN
INTRAMUSCULAR | Status: AC
  Filled 2021-04-26: qty 2

## 2021-04-26 MED ORDER — MIDAZOLAM HCL 2 MG/2ML IJ SOLN
INTRAMUSCULAR | Status: AC | PRN
  Administered 2021-04-26: .5 mg via INTRAVENOUS

## 2021-04-26 MED ORDER — DILTIAZEM HCL ER COATED BEADS 180 MG PO CP24
180.0000 mg | ORAL_CAPSULE | Freq: Every day | ORAL | Status: DC
  Administered 2021-04-26: 180 mg via ORAL
  Filled 2021-04-26 (×3): qty 1

## 2021-04-26 NOTE — TOC Progression Note (Signed)
Transition of Care (TOC) - Progression Note  ? ? ?Patient Details  ?Name: WILVER TIGNOR ?MRN: [id number] ?Date of Birth: Feb 17, 1950 ? ?Transition of Care (TOC) CM/SW Contact  ?Beverly Sessions, RN ?Phone Number: ?04/26/2021, 1:54 PM ? ?Clinical Narrative:    ? ?Patient being worked up for outpatient HD.  ?Corene Cornea with [facility] notified  ? ?Expected Discharge Plan: [place] ?Barriers to Discharge: Continued Medical Work up ? ?Expected Discharge Plan and Services ?Expected Discharge Plan: [place] ?  ?  ?Post Acute Care Choice: Home Health, Durable Medical Equipment ?Living arrangements for the past 2 months: [place] ?                ?  ?  ?  ?  ?  ?  ?  ?  ?  ?  ? ? ?Social Determinants of Health (SDOH) Interventions ?  ? ?Readmission Risk Interventions ? ?  04/22/2021  ? 12:24 PM  ?Readmission Risk Prevention Plan  ?Transportation Screening Complete  ?PCP or Specialist Appt within 3-5 Days Complete  ?Social Work Consult for [place] Planning/Counseling Complete  ?Palliative Care Screening Not Applicable  ?Medication Review Press photographer) Complete  ? ? ?

## 2021-04-26 NOTE — Progress Notes (Signed)
? ? ? ?PULMONOLOGY ? ? ? ? ? ? ? ? ?Date: 04/26/2021,   ?MRN# [id number] Blake West 03-24-1950 ? ? ?  ?AdmissionWeight: 83 kg                 ?CurrentWeight: 83.6 kg ? ?Referring provider: Dr Candiss Norse ? ? ?CHIEF COMPLAINT:  ? ?Anca + vasculitis  ? ? ?HISTORY OF PRESENT ILLNESS  ? ?This is a pleasant 71 year old male with a history of CHF with reduced EF, coronary disease gout, dyslipidemia essential hypertension, hypothyroidism testicular dysfunction, type 2 diabetes, history of glomerulonephritis with CKD stage IV came in from outpatient clinic due to provider concerns for ongoing progressive renal failure.  Patient is essentially a lifelong non-smoker reporting smoking in the 1950s.  He does report mild and subacute and chronic cough with expectoration of whitish phlegm.  Patient reports decreased appetite as well as loose stools for less than 1 week prior to arrival.  In the emergency department patient had accelerated hypertension with tachycardia however normoxia on room air.  His initial creatinine was 15 with a GFR of 3 remainder of blood work was essentially unremarkable besides electrolyte derangements including potassium 6.8 without EKG changes.  He was negative for COVID influenza a and B on arrival.  He had chest imaging done without contrast which showed nodular groundglass airspace opacification of the lung.  There was concern for pneumonia and patient is empirically treated with cefepime and vancomycin and Zithromax on arrival.  Also received Lokelma for elevated potassium.  Spinal this patient was lucid without altered mentation.  CT imaging with lung windows were independently reviewed by me with findings of chronic bronchitic changes as well as punctate multifocal bilateral groundglass nodules these findings are nonspecific and can be seen with atypical infection, viral lower respiratory tract infection as well as inflammatory lung disease. ? ? ?04/23/21- patient resting in bed with family at bedside  during my evaluation. Patient is in no distress with nasal canula off his face normoxic on room air. He ate today with better appetite and was able to get up OOB to bathroom.  Family shares he seems to be feeling better overall. Wife shares the right clavicle oppposite side of trialysis catheter seems to appear different then usual and requests imaging to be done.  I have ordered CXR.  Noted CRP with moderate elevation. Infectious workup in progress , blood work reviewed with improved GFR post HD, mild hyperglycemia and leukocytosis post pulse dose steriods, no overnight events. Appreciate everyone caring for this patient. No changes to therapy from pulm perspective.  ? ?04/24/21- patient is stable no overnight events. Repeat chest x ray with mild atelectasis.  Recommend use of incentive spirometer when able.  ? ?04/25/21-patient seen and examined at bedside.  Renal function progressively improving with dialysis.  Mental status is lucid and appropriate.  Patient had some agitation  was rude to wife and she was tearful since then he has been treated with Seroquel with subsequent improvement. They both are in good spirits now. He is eating, had normal BM and is making urine.  ? ?04/26/21- patient clinically imporved.  S/p [place] study this am.  ? ?PAST MEDICAL HISTORY  ? ?Past Medical History:  ?Diagnosis Date  ? Abnormal EKG   ? Biventricular failure ([place])   ? CAD (coronary artery disease)   ? Colorectal polyps   ? Gout   ? Hyperlipidemia LDL goal <70   ? Hypertension   ? Hypothyroidism   ? Kidney  stone   ? Pneumonia   ? Testicular hypofunction   ? Type 2 diabetes mellitus ([place]) 04/26/2019  ? ? ? ?SURGICAL HISTORY  ? ?Past Surgical History:  ?Procedure Laterality Date  ? CARDIAC CATHETERIZATION    ? DIALYSIS/PERMA CATHETER INSERTION N/A 04/22/2021  ? Procedure: DIALYSIS/PERMA CATHETER INSERTION;  Surgeon: Algernon Huxley, MD;  Location: [facility];  Service: Cardiovascular;  Laterality: N/A;  ? FOOT SURGERY    ? RIGHT/LEFT  HEART CATH AND CORONARY ANGIOGRAPHY N/A 04/30/2019  ? Procedure: RIGHT/LEFT HEART CATH AND CORONARY ANGIOGRAPHY;  Surgeon: Troy Sine, MD;  Location: [facility];  Service: Cardiovascular;  Laterality: N/A;  ? ? ? ?FAMILY HISTORY  ? ?Family History  ?Problem Relation Age of Onset  ? Heart attack Mother 30  ? Pulmonary fibrosis Father   ? Congestive Heart Failure Brother   ? Heart attack Brother   ? Heart attack Brother   ? Early death Sister   ? Kidney disease Paternal Grandfather   ? Diabetes Sister   ? ? ? ?SOCIAL HISTORY  ? ?Social History  ? ?Tobacco Use  ? Smoking status: Former  ?  Packs/day: 1.00  ?  Years: 30.00  ?  Pack years: 30.00  ?  Types: Cigarettes  ?  Quit date: 01/24/2002  ?  Years since quitting: 19.2  ? Smokeless tobacco: Never  ?Vaping Use  ? Vaping Use: Never used  ?Substance Use Topics  ? Alcohol use: No  ? Drug use: No  ? ? ? ?MEDICATIONS  ? ? ?Home Medication:  ?  ?Current Medication: ? ?Current Facility-Administered Medications:  ?  (feeding supplement) PROSource Plus liquid 30 mL, 30 mL, Oral, TID BM, Enzo Bi, MD, 30 mL at 04/25/21 2149 ?  0.9 %  sodium chloride infusion, 100 mL, Intravenous, PRN, Lucky Cowboy, Erskine Squibb, MD ?  0.9 %  sodium chloride infusion, 100 mL, Intravenous, PRN, Lucky Cowboy, Erskine Squibb, MD ?  acetaminophen (TYLENOL) tablet 650 mg, 650 mg, Oral, Q6H PRN, 650 mg at 04/26/21 0841 **OR** acetaminophen (TYLENOL) suppository 650 mg, 650 mg, Rectal, Q6H PRN, Algernon Huxley, MD ?  acidophilus (RISAQUAD) capsule 2 capsule, 2 capsule, Oral, TID, Lucky Cowboy, Erskine Squibb, MD, 2 capsule at 04/25/21 2147 ?  albuterol (PROVENTIL) (2.5 MG/3ML) 0.083% nebulizer solution 2.5 mg, 2.5 mg, Nebulization, Q6H PRN, Lucky Cowboy, Erskine Squibb, MD ?  alteplase (CATHFLO ACTIVASE) injection 2 mg, 2 mg, Intracatheter, Once PRN, Algernon Huxley, MD ?  alum & mag hydroxide-simeth (MAALOX/MYLANTA) 200-200-20 MG/5ML suspension 15 mL, 15 mL, Oral, Q6H PRN, Algernon Huxley, MD, 15 mL at 04/23/21 1512 ?  budesonide (PULMICORT) nebulizer solution  0.5 mg, 0.5 mg, Nebulization, Q6H PRN, Lucky Cowboy, Erskine Squibb, MD ?  calcium carbonate (TUMS - dosed in mg elemental calcium) chewable tablet 200 mg of elemental calcium, 1 tablet, Oral, TID PRN, Lucky Cowboy, Erskine Squibb, MD ?  Chlorhexidine Gluconate Cloth 2 % PADS 6 each, 6 each, Topical, Daily, Algernon Huxley, MD, 6 each at 04/26/21 1041 ?  diltiazem (CARDIZEM CD) 24 hr capsule 180 mg, 180 mg, Oral, Daily, Enzo Bi, MD ?  fentaNYL (SUBLIMAZE) 100 MCG/2ML injection, , , ,  ?  guaiFENesin (MUCINEX) 12 hr tablet 600 mg, 600 mg, Oral, BID, Dew, Erskine Squibb, MD, 600 mg at 04/25/21 2147 ?  haloperidol lactate (HALDOL) injection 2 mg, 2 mg, Intravenous, Q6H PRN, Enzo Bi, MD, 2 mg at 04/25/21 0416 ?  heparin injection 1,000 Units, 1,000 Units, Dialysis, PRN, Dew,  Erskine Squibb, MD ?  insulin aspart (novoLOG) injection 0-5 Units, 0-5 Units, Subcutaneous, QHS, Algernon Huxley, MD, 2 Units at 04/25/21 2147 ?  insulin aspart (novoLOG) injection 0-9 Units, 0-9 Units, Subcutaneous, TID WC, Algernon Huxley, MD, 2 Units at 04/26/21 [phone]  levothyroxine (SYNTHROID) tablet 25 mcg, 25 mcg, Oral, Q0600, Algernon Huxley, MD, 25 mcg at 04/26/21 0526 ?  lidocaine (PF) (XYLOCAINE) 1 % injection 5 mL, 5 mL, Intradermal, PRN, Lucky Cowboy, Erskine Squibb, MD ?  lidocaine-prilocaine (EMLA) cream 1 application., 1 application., Topical, PRN, Lucky Cowboy, Erskine Squibb, MD ?  MEDLINE mouth rinse, 15 mL, Mouth Rinse, BID, Enzo Bi, MD ?  methylPREDNISolone sodium succinate (SOLU-MEDROL) 1,000 mg in sodium chloride 0.9 % 50 mL IVPB, 1,000 mg, Intravenous, Daily, Breeze, Shantelle, NP, Last Rate: 66 mL/hr at 04/25/21 1552, 1,000 mg at 04/25/21 1552 ?  metoprolol succinate (TOPROL-XL) 24 hr tablet 100 mg, 100 mg, Oral, Daily, Enzo Bi, MD, 100 mg at 04/26/21 1040 ?  metoprolol tartrate (LOPRESSOR) injection 5 mg, 5 mg, Intravenous, Q2H PRN, Dew, Erskine Squibb, MD ?  midazolam (VERSED) 2 MG/2ML injection, , , ,  ?  multivitamin (RENA-VIT) tablet 1 tablet, 1 tablet, Oral, QHS, Enzo Bi, MD, 1 tablet at 04/25/21 2148 ?   ondansetron (ZOFRAN) tablet 4 mg, 4 mg, Oral, Q6H PRN **OR** ondansetron (ZOFRAN) injection 4 mg, 4 mg, Intravenous, Q6H PRN, Algernon Huxley, MD, 4 mg at 04/22/21 1036 ?  ondansetron (ZOFRAN) injectio

## 2021-04-26 NOTE — Progress Notes (Signed)
?Progress Note ? ? ?Patient: Blake West IOE:[id number] DOB: 02-Aug-1950 DOA: 04/19/2021     7 ?DOS: the patient was seen and examined on 04/26/2021 ?  ?Brief hospital course: ?[name] is a 71 year old male with history of biventricular heart failure, hypertension, gout, chronic heart failure reduced ejection fraction, history of glomerulonephritis, CKD stage IV, who presents emergency department from outpatient clinic for chief concerns of renal failure.  Patient has been followed by nephrology in the office, recommended work-up for vasculitis, but patient has not been interested per Dr. Candiss Norse. ?Patient has been having coughing and shortness of breath for 2 weeks, nausea, vomiting and diarrhea for 1 week. ?Upon arriving the hospital, patient had a serum creatinine of 15, bicarb 8, potassium 6.8.  Chest CT scan showed scattered nodular groundglass airspace disease. ?Patient is treated with bicarb drip, Zithromax, vancomycin and cefepime. ? ? ? ?Assessment and Plan: ?* Acute renal failure superimposed on stage 4 chronic kidney disease ([place]) ?--Cr 15 on presentation, BUN 247. ?Patient had a chronic kidney disease stage IV, etiology still unclear.  Worsening renal function due to dehydration from diarrhea. ?Patient also has significant hyperkalemia and metabolic acidosis.   ?--there was also concern for vasculitis ?--dialysis cath placement and started dialysis on 3/30 ?Plan: ?--vasculitis workup per nephro ?--cont IV solumedrol for now, per nephro ?--kidney biopsy today ?--Plan for rituximab based on biopsy results ?--need to set up outpatient dialysis ? ?Delirium ?Hospital Delirium ?--started to get agitated and combative starting yesterday.  Also noted to not sleep during the night. ?Plan: ?--cont seroquel to 100 mg nightly ?--IV haldol PRN ? ?Acute urinary retention ?--Pt had a foley inserted on presentation, with indication of acute urinary retention. ?--Foley d/c'ed on 4/2, and pt able to avoid ? ?Acute  hypoxemic respiratory failure ([place]) ?--developed overnight, hypoxic requiring 4L O2.  Likely due to fluid overload from bicarb gtt. ?Plan: ?--dialysis for fluid removal ? ? ?Malnutrition of moderate degree ?--supplements per dietician  ? ?High anion gap metabolic acidosis ?This is secondary to acute on chronic renal failure.  ?--s/p Na bicarb gtt ? ? ?Chronic atrial fibrillation with RVR ([facility]) ?--cont Toprol to 100 mg daily  ?--transition from dilt 60 q6h to cardizem 180 mg daily ?--will plan to start anticoagulation tomorrow ? ?Multifocal pneumonia ?Mild elevation of procalcitonin level in the setting of acute renal failure.  MRSA culture negative, discontinued vancomycin.   ?--completed Rocephin and Zithromax for total 5 day abx ? ?Hyperkalemia ?Potassium has normalized.  This is secondary to acute kidney injury as well as severe metabolic acidosis. ? ?Diarrhea ?Patient has been having diarrhea for 1 week, but none since presentation, so C diff and GI path could not be collected. ? ?Severe sepsis with acute organ dysfunction ([place]) ?Patient has severe sepsis with tachycardia, tachypnea, leukocytosis and a severe renal failure.  This is most likely due to pneumonia. ?He has received IV fluids.  ? ? ?Chronic HFrEF (heart failure with reduced ejection fraction) ([place]) ?Acute CHF ruled out. ?Reviewed echocardiogram performed/2021, ejection fraction 20 to 25%. ?Patient was volume depleted at time of admission, no exacerbation of congestive heart failure.  ?Patient was not on evidence-based congestive heart failure treatment. ?--cont Toprol to 100 mg daily  ? ?Hypothyroidism ?Continue Synthroid. ? ?Hyperlipidemia ?Not currently treated. ? ?Essential hypertension ?--cont Toprol 100 mg daily  ?--transition short-acting dilt to Cardizem 180 mg daily today ? ? ? ? ?  ? ?Subjective:  ?Per RN, pt slept better last night with increased seroquel.  Agitation improved. ? ?Per daughter, pt ate better. ? ?Received kidney biopsy  today. ? ? ?Physical Exam: ? ?Constitutional: NAD, AAOx3 ?HEENT: conjunctivae and lids normal, EOMI ?CV: No cyanosis.   ?RESP: normal respiratory effort, on 4L ?SKIN: warm, dry ?Neuro: II - XII grossly intact.   ?Psych: depressed mood and affect.   ? ? ?Data Reviewed: ? ?Family Communication: family updated at bedside today ? ?Disposition: ?Status is: Inpatient ?Remains inpatient appropriate because: need to set up outpatient dialysis  ? ? Planned Discharge Destination: Home  family declined SNF ? ? ? ?Time spent: 50 minutes ? ?Author: ?Enzo Bi, MD ?04/26/2021 4:29 PM ? ?For on call review [email or web address].  ?

## 2021-04-26 NOTE — Progress Notes (Signed)
? 04/23/21 0826 04/23/21 0830 04/23/21 0845  ?Vitals  ?Temp  --   --   --   ?Temp Source  --   --   --   ?BP (!) 129/100 121/83 125/77  ?MAP (mmHg) 111 97 93  ?Pulse Rate (!) 104 (!) 109 (!) 34  ?ECG Heart Rate (!) 122 (!) 108 (!) 103  ?Resp 20 13 (!) 21  ?During Hemodialysis Assessment  ?Blood Flow Rate (mL/min) 250 mL/min 250 mL/min 250 mL/min  ?Arterial Pressure (mmHg) -100 mmHg -100 mmHg -100 mmHg  ?Venous Pressure (mmHg) 80 mmHg 90 mmHg 90 mmHg  ?Transmembrane Pressure (mmHg) 30 mmHg 20 mmHg 20 mmHg  ?Ultrafiltration Rate (mL/min) 200 mL/min 200 mL/min 200 mL/min  ?Dialysate Flow Rate (mL/min) 300 ml/min 300 ml/min 300 ml/min  ?Conductivity: Machine  13.8 14 13.7  ?HD Safety Checks Performed Yes Yes Yes  ?Dialysis Fluid Bolus Normal Saline  --   --   ?Bolus Amount (mL) 250 mL  --   --   ?Intra-Hemodialysis Comments Tx initiated Progressing as prescribed Progressing as prescribed  ? ? 04/23/21 0900 04/23/21 0915 04/23/21 0930  ?Vitals  ?Temp  --   --   --   ?Temp Source  --   --   --   ?BP 111/81 123/86 126/89  ?MAP (mmHg) 92 100 98  ?Pulse Rate (!) 56 62 (!) 44  ?ECG Heart Rate (!) 111 (!) 115 (!) 114  ?Resp (!) 31 (!) 33 15  ?During Hemodialysis Assessment  ?Blood Flow Rate (mL/min) 250 mL/min 250 mL/min 250 mL/min  ?Arterial Pressure (mmHg) -100 mmHg -100 mmHg -100 mmHg  ?Venous Pressure (mmHg) 90 mmHg 90 mmHg 90 mmHg  ?Transmembrane Pressure (mmHg) 20 mmHg 20 mmHg 20 mmHg  ?Ultrafiltration Rate (mL/min) 200 mL/min 200 mL/min 200 mL/min  ?Dialysate Flow Rate (mL/min) 300 ml/min 300 ml/min 300 ml/min  ?Conductivity: Machine  13.7 13.7 13.7  ?HD Safety Checks Performed Yes Yes Yes  ?Dialysis Fluid Bolus  --   --   --   ?Bolus Amount (mL)  --   --   --   ?Intra-Hemodialysis Comments Progressing as prescribed Progressing as prescribed Progressing as prescribed  ? ? 04/23/21 0945 04/23/21 1000 04/23/21 1015  ?Vitals  ?Temp  --   --   --   ?Temp Source  --   --   --   ?BP (!) 137/92 (!) 126/101 111/89  ?MAP (mmHg)  107 111 99  ?Pulse Rate (!) 120 (!) 52 (!) 111  ?ECG Heart Rate (!) 130 (!) 122 (!) 122  ?Resp  --  13 14  ?During Hemodialysis Assessment  ?Blood Flow Rate (mL/min) 250 mL/min 250 mL/min 250 mL/min  ?Arterial Pressure (mmHg) -110 mmHg -110 mmHg -110 mmHg  ?Venous Pressure (mmHg) 90 mmHg 90 mmHg 90 mmHg  ?Transmembrane Pressure (mmHg) 20 mmHg 20 mmHg 20 mmHg  ?Ultrafiltration Rate (mL/min) 0 mL/min ?(pt heart rate is high) 0 mL/min 0 mL/min  ?Dialysate Flow Rate (mL/min) 300 ml/min 300 ml/min 300 ml/min  ?Conductivity: Machine  13.8 13.8 13.8  ?HD Safety Checks Performed Yes Yes Yes  ?Dialysis Fluid Bolus  --   --   --   ?Bolus Amount (mL)  --   --   --   ?Intra-Hemodialysis Comments Progressing as prescribed Progressing as prescribed Progressing as prescribed  ? ? 04/23/21 1021  ?Vitals  ?Temp (!) 97.5 ?F (36.4 ?C)  ?Temp Source Axillary  ?BP 120/85  ?MAP (mmHg) 95  ?Pulse Rate Marland Kitchen)  27  ?ECG Heart Rate (!) 121  ?Resp (!) 24  ?During Hemodialysis Assessment  ?Blood Flow Rate (mL/min) 250 mL/min  ?Arterial Pressure (mmHg) -110 mmHg  ?Venous Pressure (mmHg) 90 mmHg  ?Transmembrane Pressure (mmHg) 20 mmHg  ?Ultrafiltration Rate (mL/min) 0 mL/min  ?Dialysate Flow Rate (mL/min) 300 ml/min  ?Conductivity: Machine  13.8  ?HD Safety Checks Performed Yes  ?Dialysis Fluid Bolus  --   ?Bolus Amount (mL)  --   ?Intra-Hemodialysis Comments Tx completed  ? ?

## 2021-04-26 NOTE — Progress Notes (Signed)
Following patient for new hemodialysis placement.  ?

## 2021-04-26 NOTE — Progress Notes (Signed)
?   04/24/21 1130 04/24/21 1145 04/24/21 1200  ?Vitals  ?Temp  --   --   --   ?Temp Source  --   --   --   ?BP 121/81 (!) 122/95 (!) 115/91  ?MAP (mmHg) 95 102 100  ?Pulse Rate (!) 59 98 (!) 104  ?ECG Heart Rate (!) 107 92 (!) 137  ?Resp 15  --   --   ?During Hemodialysis Assessment  ?Blood Flow Rate (mL/min) 250 mL/min 250 mL/min 250 mL/min  ?Arterial Pressure (mmHg) -100 mmHg -100 mmHg -100 mmHg  ?Venous Pressure (mmHg) 90 mmHg -80 mmHg 90 mmHg  ?Transmembrane Pressure (mmHg) 20 mmHg 30 mmHg 30 mmHg  ?Ultrafiltration Rate (mL/min) 200 mL/min 200 mL/min 200 mL/min  ?Dialysate Flow Rate (mL/min) 300 ml/min 300 ml/min 300 ml/min  ?Conductivity: Machine  13.6 13.7 13.6  ?HD Safety Checks Performed Yes Yes Yes  ?Intra-Hemodialysis Comments Progressing as prescribed Progressing as prescribed Progressing as prescribed  ? ? 04/24/21 1215 04/24/21 1230 04/24/21 1235  ?Vitals  ?Temp  --   --  97.9 ?F (36.6 ?C)  ?Temp Source  --   --  Oral  ?BP (!) 135/92 (!) 129/98 (!) 148/93  ?MAP (mmHg) 106 108 106  ?Pulse Rate (!) 117 (!) 122 (!) 49  ?ECG Heart Rate (!) 122 (!) 102 (!) 116  ?Resp  --   --   --   ?During Hemodialysis Assessment  ?Blood Flow Rate (mL/min) 250 mL/min 250 mL/min 250 mL/min  ?Arterial Pressure (mmHg) -100 mmHg -100 mmHg -100 mmHg  ?Venous Pressure (mmHg) 90 mmHg 90 mmHg 90 mmHg  ?Transmembrane Pressure (mmHg) 30 mmHg 30 mmHg 30 mmHg  ?Ultrafiltration Rate (mL/min) 200 mL/min 200 mL/min 200 mL/min  ?Dialysate Flow Rate (mL/min) 300 ml/min 300 ml/min 300 ml/min  ?Conductivity: Machine  13.6 13.6 13.6  ?HD Safety Checks Performed Yes Yes Yes  ?Intra-Hemodialysis Comments Progressing as prescribed;Tolerated well Progressing as prescribed;Tolerated well Tx completed  ? ?

## 2021-04-26 NOTE — Progress Notes (Signed)
Physical Therapy Treatment ?Patient Details ?Name: Blake West ?MRN: [id number] ?DOB: 1950/02/25 ?Today's Date: 04/26/2021 ? ? ?History of Present Illness 71 year old male with history of biventricular heart failure, hypertension, gout, chronic heart failure reduced ejection fraction, history of glomerulonephritis, CKD stage IV, who presents emergency department from outpatient clinic for chief concerns of renal failure.  Patient has been having coughing and shortness of breath for 2 weeks, nausea, vomiting and diarrhea for 1 week. Pt admitted for severe sepsis with acute organ dysfunction most likely due to pneumonia ? ?  ?PT Comments  ? ? Pt with biopsy earlier and off 3 hr bedrest orders.  Cleared by RN to participate.  He is fatigued from activity today but agrees to session with encouragement from wife.  To EOB with rails and min guard.  Steady in sitting.  He does stand x 3 at bedside and is able to march in place for about 10 steps each time before fatigue and needing to sit.  General shaking in BUE/LE during activity.  He is unsafe at this time to walk away from bed.  Returns to supine to rest. ? ?Long discussion with wife and daughter.  SNF remains highly recommended for discharge at this time.  He has not demonstrated safe gait or transfers at this time and requires close +1 for transfers and standing.  Wife is primary caregiver with daughter down the road if help is needed.  Wife stated she is a CNA and feels comfortable moving him.  He does have stairs to enter home and she wanted them tried today but he was physically unable to walk any safe distance and not appropriate for stair training.   ? ?Equipment needs are updated in note along with recommendation for wheelchair as he is unable to ambulate household distances at this time. ? ?Patient suffers from general weakness from chronic medical conditions which impairs his/her ability to perform daily activities like toileting, feeding, dressing, grooming,  bathing in the home. A cane, walker, crutch will not resolve the patient's issue with performing activities of daily living. A lightweight wheelchair and cushion is required/recommended and will allow patient to safely perform daily activities. ?  ?Patient can safely propel the wheelchair in the home or has a caregiver who can provide assistance.  ? ?Again, highly recommend SNF at discharge but pt and daughter remain resistant.  Will need all available assist at home along with HHPT.  Pt may need EMS transport home if unable to do steps or a ramp rental to allow for in/out of home for dialysis treatment.   ? ?Will see pt again tomorrow in hopes of improvement and ability to walk safely and stair training.   ?  ?Recommendations for follow up therapy are one component of a multi-disciplinary discharge planning process, led by the attending physician.  Recommendations may be updated based on patient status, additional functional criteria and insurance authorization. ? ?Follow Up Recommendations ? Skilled nursing-short term rehab (<3 hours/day) ?  ?  ?Assistance Recommended at Discharge Frequent or constant Supervision/Assistance  ?Patient can return home with the following Help with stairs or ramp for entrance;Assist for transportation;A little help with bathing/dressing/bathroom;A lot of help with walking and/or transfers;Assistance with cooking/housework ?  ?Equipment Recommendations ? Rolling walker (2 wheels);BSC/3in1;Wheelchair (measurements PT);Wheelchair cushion (measurements PT)  ?  ?Recommendations for Other Services   ? ? ?  ?Precautions / Restrictions Precautions ?Precautions: Fall ?Restrictions ?Weight Bearing Restrictions: No  ?  ? ?Mobility ? Bed Mobility ?Overal bed  mobility: Needs Assistance ?Bed Mobility: Supine to Sit, Sit to Supine ?  ?  ?Supine to sit: Supervision, Min guard ?Sit to supine: Min guard ?  ?  ?  ? ?Transfers ?Overall transfer level: Needs assistance ?Equipment used: Rolling walker (2  wheels) ?Transfers: Sit to/from Stand ?Sit to Stand: Min assist ?  ?  ?  ?  ?  ?  ?  ? ?Ambulation/Gait ?  ?  ?  ?  ?Gait velocity: decreased ?  ?  ?General Gait Details: stepping in place 3 x 10 marches before fatigue and overall trembling BLE/UE unsafe to transition from bed ? ? ?Stairs ?  ?  ?  ?  ?  ? ? ?Wheelchair Mobility ?  ? ?Modified Rankin (Stroke Patients Only) ?  ? ? ?  ?Balance Overall balance assessment: Needs assistance ?Sitting-balance support: Feet supported, Bilateral upper extremity supported ?Sitting balance-Leahy Scale: Fair ?Sitting balance - Comments: reported nausea sitting EOB, forward flexed posture ?  ?Standing balance support: Bilateral upper extremity supported, During functional activity, Reliant on assistive device for balance ?Standing balance-Leahy Scale: Fair ?Standing balance comment: requires CGA to maintain standing balance, increased forward flexion in static standing with RW ?  ?  ?  ?  ?  ?  ?  ?  ?  ?  ?  ?  ? ?  ?Cognition Arousal/Alertness: Awake/alert ?Behavior During Therapy: [facility] for tasks assessed/performed, Flat affect ?Overall Cognitive Status: Within Functional Limits for tasks assessed ?  ?  ?  ?  ?  ?  ?  ?  ?  ?  ?  ?  ?  ?  ?  ?  ?  ?  ?  ? ?  ?Exercises   ? ?  ?General Comments   ?  ?  ? ?Pertinent Vitals/Pain Pain Assessment ?Pain Assessment: No/denies pain  ? ? ?Home Living   ?  ?  ?  ?  ?  ?  ?  ?  ?  ?   ?  ?Prior Function    ?  ?  ?   ? ?PT Goals (current goals can now be found in the care plan section) Progress towards PT goals: Progressing toward goals ? ?  ?Frequency ? ? ? Min 2X/week ? ? ? ?  ?PT Plan Current plan remains appropriate  ? ? ?Co-evaluation   ?  ?  ?  ?  ? ?  ?AM-PAC PT "6 Clicks" Mobility   ?Outcome Measure ? Help needed turning from your back to your side while in a flat bed without using bedrails?: A Little ?Help needed moving from lying on your back to sitting on the side of a flat bed without using bedrails?: A Little ?Help needed  moving to and from a bed to a chair (including a wheelchair)?: A Lot ?Help needed standing up from a chair using your arms (e.g., wheelchair or bedside chair)?: A Little ?Help needed to walk in hospital room?: A Lot ?Help needed climbing 3-5 steps with a railing? : A Lot ?6 Click Score: 15 ? ?  ?End of Session Equipment Utilized During Treatment: Gait belt ?Activity Tolerance: Patient limited by fatigue ?Patient left: in bed;with call bell/phone within reach;with family/visitor present;with bed alarm set ?Nurse Communication: Mobility status ?PT Visit Diagnosis: Unsteadiness on feet (R26.81);Muscle weakness (generalized) (M62.81);Difficulty in walking, not elsewhere classified (R26.2);Dizziness and giddiness (R42);Pain ?  ? ? ?Time: 2774-1287 ?PT Time Calculation (min) (ACUTE ONLY): 12 min ? ?Charges:  $Therapeutic Activity:  8-22 mins          ?         Chesley Noon, PTA ?04/26/21, 4:11 PM ? ?

## 2021-04-26 NOTE — Progress Notes (Signed)
?   04/24/21 1000 04/24/21 1015 04/24/21 1030  ?Vitals  ?BP 125/73 116/88 119/82  ?MAP (mmHg) 88 100 90  ?Pulse Rate 79 (!) 59 91  ?ECG Heart Rate 86 85 92  ?During Hemodialysis Assessment  ?Blood Flow Rate (mL/min) 250 mL/min 250 mL/min 250 mL/min  ?Arterial Pressure (mmHg) -90 mmHg -90 mmHg -100 mmHg  ?Venous Pressure (mmHg) 80 mmHg 80 mmHg 80 mmHg  ?Transmembrane Pressure (mmHg) 30 mmHg 30 mmHg 30 mmHg  ?Ultrafiltration Rate (mL/min) 250 mL/min 250 mL/min 250 mL/min  ?Dialysate Flow Rate (mL/min) 300 ml/min 300 ml/min 300 ml/min  ?Conductivity: Machine  14 13.7 13.7  ?HD Safety Checks Performed Yes Yes Yes  ?Intra-Hemodialysis Comments Tx initiated Progressing as prescribed;Tolerated well Progressing as prescribed;Tolerated well  ? ? 04/24/21 1045 04/24/21 1100 04/24/21 1115  ?Vitals  ?BP (!) 125/91 125/82 134/81  ?MAP (mmHg) 102 93 98  ?Pulse Rate (!) 101 (!) 136 (!) 46  ?ECG Heart Rate 92 92 (!) 105  ?During Hemodialysis Assessment  ?Blood Flow Rate (mL/min) 250 mL/min 250 mL/min 250 mL/min  ?Arterial Pressure (mmHg) -100 mmHg -100 mmHg -100 mmHg  ?Venous Pressure (mmHg) 80 mmHg 80 mmHg 80 mmHg  ?Transmembrane Pressure (mmHg) 30 mmHg 300 mmHg 300 mmHg  ?Ultrafiltration Rate (mL/min) 250 mL/min 200 mL/min 200 mL/min  ?Dialysate Flow Rate (mL/min) 300 ml/min 300 ml/min 300 ml/min  ?Conductivity: Machine  13.7 13.7 13.7  ?HD Safety Checks Performed Yes Yes Yes  ?Intra-Hemodialysis Comments Progressing as prescribed;Tolerated well Progressing as prescribed;Tolerated well Progressing as prescribed;Tolerated well  ? ?

## 2021-04-26 NOTE — Plan of Care (Signed)
Urinated about 300 this morning, Post void residual approximately 302 ml,  Miss K. Foust notified awaiting for response.   ? ?

## 2021-04-26 NOTE — Progress Notes (Signed)
Occupational Therapy Treatment ?Patient Details ?Name: Blake West ?MRN: [id number] ?DOB: 11/04/1950 ?Today's Date: 04/26/2021 ? ? ?History of present illness 71 year old male with history of biventricular heart failure, hypertension, gout, chronic heart failure reduced ejection fraction, history of glomerulonephritis, CKD stage IV, who presents emergency department from outpatient clinic for chief concerns of renal failure.  Patient has been having coughing and shortness of breath for 2 weeks, nausea, vomiting and diarrhea for 1 week. Pt admitted for severe sepsis with acute organ dysfunction most likely due to pneumonia ?  ?OT comments ? Pt seen for OT treatment on this date (RN aware and approving for OT tx following bedrest for renal bx). Upon arrival to room, pt asleep in bed with wife and daughter present. Pt appearing more lethargic this date compared to previous sessions and inconsistently following 1-step instructions. During session, pt and family educated on importance of encouraging pt to maintain regular sleep schedule to prevent delirium while admitted; pt's wife verbalized understanding and lights were turned on/blinds opened. Pt required MIN GUARD for bed mobility. Once seated EOB, pt demonstrated FAIR static sitting balance at EOB. Pt required MIN GUARD-MIN A for stand pivot transfer to/from [facility] d/t decreased balance, activity tolerance, and safety awareness. Of note, pt declined to use RW and instead held on to sink counter for UE support during transfer (family reporting they have been assisting pt for [facility] transfers in this manner while pt has been admitted). Pt required MIN A for sit>stand peri-care d/t decreased balance, strength, and activity tolerance. Pt performed x2 seated grooming tasks at EOB with SUPERVISION/SET-UP assist before multiple providers entered room and pt returned to supine position. At end of session, pt left sitting upright in bed, with RN and family present.  Pt is making  good progress toward goals and continues to benefit from skilled OT services to maximize return to PLOF and minimize risk of future falls, injury, caregiver burden, and readmission. Will continue to follow POC. Discharge recommendation of SNF remains appropriate as pt requires significant more assistance for ADLs/functional mobility compared to baseline.   ? ?Recommendations for follow up therapy are one component of a multi-disciplinary discharge planning process, led by the attending physician.  Recommendations may be updated based on patient status, additional functional criteria and insurance authorization. ?   ?Follow Up Recommendations ? Skilled nursing-short term rehab (<3 hours/day)  ?  ?Assistance Recommended at Discharge Frequent or constant Supervision/Assistance  ?Patient can return home with the following ? A lot of help with bathing/dressing/bathroom;A lot of help with walking and/or transfers ?  ?Equipment Recommendations ? Other (comment) (per next venue of care)  ?  ?   ?Precautions / Restrictions Precautions ?Precautions: Fall ?Restrictions ?Weight Bearing Restrictions: No  ? ? ?  ? ?Mobility Bed Mobility ?Overal bed mobility: Needs Assistance ?Bed Mobility: Supine to Sit, Sit to Supine ?  ?  ?Supine to sit: Min guard ?Sit to supine: Min guard ?  ?  ?  ? ?Transfers ?Overall transfer level: Needs assistance ?Equipment used: Rolling walker (2 wheels) ?Transfers: Sit to/from Stand ?Sit to Stand: Min guard, Min assist ?  ?  ?  ?  ?  ?General transfer comment: x2 from EOB, x1 from [facility] ?  ?  ?Balance Overall balance assessment: Needs assistance ?Sitting-balance support: Feet supported, Bilateral upper extremity supported ?Sitting balance-Leahy Scale: Fair ?  ?  ?Standing balance support: Bilateral upper extremity supported, During functional activity ?Standing balance-Leahy Scale: Fair ?Standing balance comment: requires CGA to maintain  standing balance, increased forward flexion in static standing with  RW ?  ?  ?  ?  ?  ?  ?  ?  ?  ?  ?  ?   ? ?ADL either performed or assessed with clinical judgement  ? ?ADL Overall ADL's : Needs assistance/impaired ?  ?  ?Grooming: Wash/dry hands;Wash/dry face;Min guard;Sitting ?  ?  ?  ?  ?  ?  ?  ?  ?  ?Toilet Transfer: Min guard;Stand-pivot;BSC/3in1 ?  ?Toileting- Clothing Manipulation and Hygiene: Minimal assistance;Sit to/from stand ?  ?  ?  ?  ?  ?  ? ? ? ?Cognition Arousal/Alertness: Lethargic ?Behavior During Therapy: Flat affect ?Overall Cognitive Status: Impaired/Different from baseline ?  ?  ?  ?  ?  ?  ?  ?  ?  ?  ?  ?  ?  ?  ?  ?  ?General Comments: Pt more lethargic this date compared to previous sessions. Pt inconsistently following 1-step instructions and presenting with decreased safety awareness ?  ?  ?   ?   ?   ?   ? ? ?Pertinent Vitals/ Pain       Pain Assessment ?Pain Assessment: Faces ?Faces Pain Scale: Hurts a little bit ?Pain Location: back ?Pain Descriptors / Indicators: Tender ?Pain Intervention(s): Limited activity within patient's tolerance, Monitored during session, Patient requesting pain meds-RN notified ? ?   ?   ? ?Frequency ? Min 2X/week  ? ? ? ? ?  ?Progress Toward Goals ? ?OT Goals(current goals can now be found in the care plan section) ? Progress towards OT goals: Progressing toward goals ? ?Acute Rehab OT Goals ?Patient Stated Goal: to get stronger ?OT Goal Formulation: With patient/family ?Time For Goal Achievement: 05/05/21 ?Potential to Achieve Goals: Good  ?Plan Discharge plan remains appropriate;Frequency remains appropriate   ? ?   ?AM-PAC OT "6 Clicks" Daily Activity     ?Outcome Measure ? ? Help from another person eating meals?: None ?Help from another person taking care of personal grooming?: A Little ?Help from another person toileting, which includes using toliet, bedpan, or urinal?: A Lot ?Help from another person bathing (including washing, rinsing, drying)?: A Lot ?Help from another person to put on and taking off regular  upper body clothing?: A Little ?Help from another person to put on and taking off regular lower body clothing?: A Lot ?6 Click Score: 16 ? ?  ?End of Session Equipment Utilized During Treatment: Rolling walker (2 wheels) ? ?OT Visit Diagnosis: Unsteadiness on feet (R26.81);Muscle weakness (generalized) (M62.81) ?  ?Activity Tolerance Patient tolerated treatment well ?  ?Patient Left in bed;with call bell/phone within reach;with bed alarm set;with family/visitor present;with nursing/sitter in room ?  ?Nurse Communication Mobility status ?  ? ?   ? ?Time: 6256-3893 ?OT Time Calculation (min): 23 min ? ?Charges: OT General Charges ?$OT Visit: 1 Visit ?OT Treatments ?$Self Care/Home Management : 23-37 mins ? ?Fredirick Maudlin, OTR/L ?[place] ? ?

## 2021-04-26 NOTE — Care Management Important Message (Signed)
Important Message ? ?Patient Details  ?Name: Blake West ?MRN: [id number] ?Date of Birth: [birth year] ? ? ?Medicare Important Message Given:  Yes ? ?Patient out of room for procedure and family had stepped out of room upon time of visit.  Copy of Medicare IM left in room for reference.   ? ? ?Dannette Barbara ?04/26/2021, 11:42 AM ?

## 2021-04-26 NOTE — Procedures (Signed)
Interventional Radiology Procedure Note ? ?Procedure:  US guided left medical renal bx.  ?Complications: None ?Recommendations:  ?- 3 hrs bedrest ?- ok to advance diet per primary order ?- Do not submerge for 7 days ?- Routine wound care  ? ?Signed, ? ?Dulcy Fanny. Earleen Newport, DO ? ? ?

## 2021-04-26 NOTE — Progress Notes (Signed)
?[facility]  ?ROUNDING NOTE  ? ?Subjective:  ? 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  Patient presents to the emergency department with complaints of nausea, vomiting, generalized decline.  Patient has been admitted for Hyperkalemia [E87.5] ?Atrial fibrillation with RVR ([place]) [I48.91] ?Severe sepsis with acute organ dysfunction ([place]) [A41.9, R65.20] ?Acute renal failure, unspecified acute renal failure type ([place]) [N17.9] ? ? ?Update: ?Patient seen resting in bed ?Alert and oriented to person and place ?Wife and daughter at bedside ?Appetite remains poor ?Currently NPO for procedure ? ? ?Objective:  ?Vital signs in last 24 hours:  ?Temp:  [97.6 ?F (36.4 ?C)-98.1 ?F (36.7 ?C)] 97.6 ?F (36.4 ?C) (04/03 1514) ?Pulse Rate:  [76-107] 107 (04/03 1514) ?Resp:  [12-20] 19 (04/03 1514) ?BP: (113-128)/(71-90) 113/85 (04/03 1514) ?SpO2:  [87 %-98 %] 91 % (04/03 1514) ? ?Weight change:  ?Filed Weights  ? 04/23/21 1021 04/24/21 0957 04/24/21 1235  ?Weight: 84.1 kg 84.7 kg 83.6 kg  ? ? ?Intake/Output: ?I/O last 3 completed shifts: ?In: 546.1 [P.O.:480; IV Piggyback:66.1] ?Out: 2234 [Urine:2234] ?  ?Intake/Output this shift: ? No intake/output data recorded. ? ?Physical Exam: ?General: NAD  ?Head: Normocephalic, atraumatic.  Dry oral mucosal membranes  ?Eyes: Anicteric  ?Lungs:  Clear to auscultation, normal effort  ?Heart: Regular rate and rhythm  ?Abdomen:  Soft, nontender, nondistended  ?Extremities: No peripheral edema.  ?Neurologic: Alert, oriented to self only, moving all four extremities  ?Skin: No lesions  ?Access: Rt permcath placed on 04/22/21 by Dr Lucky Cowboy  ? ? ?Basic Metabolic Panel: ?Recent Labs  ?Lab 04/20/21 ?9323 04/21/21 ?0434 04/22/21 ?5573 04/23/21 ?2202 04/24/21 ?5427 04/25/21 ?0623 04/26/21 ?7628  ?NA  139   < > 139 139 139 135 132*  ?K 5.1   < > 4.8 3.6 3.8 3.7 3.5  ?CL 106   < > 102 96* 96* 95* 93*  ?CO2 9*   < > 12* 24 26 25  21*  ?GLUCOSE 119*   < > 171* 200* 174* 159* 176*  ?BUN 195*   < > 159* 156* 126* 89* 121*  ?CREATININE 14.69*   < > 14.09* 10.24* 8.04* 6.16* 7.54*  ?CALCIUM 8.7*   < > 8.1* 8.1* 8.5* 8.5* 8.2*  ?MG 2.9*  --  2.9* 2.6* 2.5* 2.4 2.5*  ?PHOS 13.2*  --   --   --   --   --   --   ? < > = values in this interval not displayed.  ? ? ? ?Liver Function Tests: ?Recent Labs  ?Lab 04/19/21 ?2110 04/21/21 ?0434  ?AST 11* 12*  ?ALT 9 10  ?ALKPHOS 68 48  ?BILITOT 1.0 1.3*  ?PROT 8.1 6.2*  ?ALBUMIN 3.7 3.0*  ? ? ?Recent Labs  ?Lab 04/19/21 ?2110  ?LIPASE 88*  ? ? ?No results for input(s): AMMONIA in the last 168 hours. ? ?CBC: ?Recent Labs  ?Lab 04/19/21 ?2110 04/20/21 ?3151 04/21/21 ?7616 04/22/21 ?0737 04/23/21 ?1062 04/24/21 ?6948 04/25/21 ?5462 04/26/21 ?7035  ?WBC 15.7* 14.4* 13.1* 7.0 12.2* 13.0* 12.8* 10.8*  ?NEUTROABS 14.3* 11.4* 10.3*  --   --   --   --   --   ?HGB 13.3 10.6* 10.1* 10.0* 10.0* 10.4* 9.5* 9.0*  ?HCT 40.5 32.0* 29.6* 30.2* 29.0* 31.1* 28.9* 27.1*  ?MCV 86.9 87.0 84.3 84.8 83.8 86.9 86.0 86.6  ?PLT 267 221 205  197 187 176 163 143*  ? ? ? ?Cardiac Enzymes: ?No results for input(s): CKTOTAL, CKMB, CKMBINDEX, TROPONINI in the last 168 hours. ? ?BNP: ?Invalid input(s): POCBNP ? ?CBG: ?Recent Labs  ?Lab 04/25/21 ?1142 04/25/21 ?1634 04/25/21 ?2107 04/26/21 ?6333 04/26/21 ?1223  ?GLUCAP 172* 188* 218* 194* 152*  ? ? ? ?Microbiology: ?Results for orders placed or performed during the hospital encounter of 04/19/21  ?Resp Panel by RT-PCR (Flu A&B, Covid) Nasopharyngeal Swab     Status: None  ? Collection Time: 04/19/21  9:14 PM  ? Specimen: Nasopharyngeal Swab; Nasopharyngeal(NP) swabs in vial transport medium  ?Result Value Ref Range Status  ? SARS Coronavirus 2 by RT PCR NEGATIVE NEGATIVE Final  ?  Comment: (NOTE) ?SARS-CoV-2 target nucleic acids are NOT DETECTED. ? ?The SARS-CoV-2 RNA  is generally detectable in upper respiratory ?specimens during the acute phase of infection. The lowest ?concentration of SARS-CoV-2 viral copies this assay can detect is ?138 copies/mL. A negative result does not preclude SARS-Cov-2 ?infection and should not be used as the sole basis for treatment or ?other patient management decisions. A negative result may occur with  ?improper specimen collection/handling, submission of specimen other ?than nasopharyngeal swab, presence of viral mutation(s) within the ?areas targeted by this assay, and inadequate number of viral ?copies(<138 copies/mL). A negative result must be combined with ?clinical observations, patient history, and epidemiological ?information. The expected result is Negative. ? ?Fact Sheet for Patients:  ?EntrepreneurPulse.com.au ? ?Fact Sheet for Healthcare Providers:  ?IncredibleEmployment.be ? ?This test is no t yet approved or cleared by the [place] FDA and  ?has been authorized for detection and/or diagnosis of SARS-CoV-2 by ?FDA under an Emergency Use Authorization (EUA). This EUA will remain  ?in effect (meaning this test can be used) for the duration of the ?COVID-19 declaration under Section 564(b)(1) of the Act, 21 ?U.S.C.section 360bbb-3(b)(1), unless the authorization is terminated  ?or revoked sooner.  ? ? ?  ? Influenza A by PCR NEGATIVE NEGATIVE Final  ? Influenza B by PCR NEGATIVE NEGATIVE Final  ?  Comment: (NOTE) ?The Xpert Xpress SARS-CoV-2/FLU/RSV plus assay is intended as an aid ?in the diagnosis of influenza from Nasopharyngeal swab specimens and ?should not be used as a sole basis for treatment. Nasal washings and ?aspirates are unacceptable for Xpert Xpress SARS-CoV-2/FLU/RSV ?testing. ? ?Fact Sheet for Patients: ?EntrepreneurPulse.com.au ? ?Fact Sheet for Healthcare Providers: ?IncredibleEmployment.be ? ?This test is not yet approved or cleared by the  [place] FDA and ?has been authorized for detection and/or diagnosis of SARS-CoV-2 by ?FDA under an Emergency Use Authorization (EUA). This EUA will remain ?in effect (meaning this test can be used) for the duration of the ?COVID-19 declaration under Section 564(b)(1) of the Act, 21 U.S.C. ?section 360bbb-3(b)(1), unless the authorization is terminated or ?revoked. ? ?Performed at [facility], [place], ?[place] [address] ?  ?Culture, blood (routine x 2)     Status: None  ? Collection Time: 04/19/21  9:45 PM  ? Specimen: BLOOD  ?Result Value Ref Range Status  ? Specimen Description BLOOD RIGHT ANTECUBITAL  Final  ? Special Requests   Final  ?  BOTTLES DRAWN AEROBIC AND ANAEROBIC Blood Culture adequate volume  ? Culture   Final  ?  NO GROWTH 5 DAYS ?Performed at [facility], [address] [place], [place] [address] ?  ? Report Status 04/24/2021 FINAL  Final  ?Culture, blood (routine x 2)     Status: None  ?  Collection Time: 04/19/21 10:32 PM  ? Specimen: BLOOD  ?Result Value Ref Range Status  ? Specimen Description BLOOD LEFT ASSIST CONTROL  Final  ? Special Requests   Final  ?  BOTTLES DRAWN AEROBIC AND ANAEROBIC Blood Culture adequate volume  ? Culture   Final  ?  NO GROWTH 5 DAYS ?Performed at [facility], [address] [place], [place] [address] ?  ? Report Status 04/24/2021 FINAL  Final  ?MRSA Next Gen by PCR, Nasal     Status: None  ? Collection Time: 04/20/21  2:21 AM  ? Specimen: Nasal Mucosa; Nasal Swab  ?Result Value Ref Range Status  ? MRSA by PCR Next Gen NOT DETECTED NOT DETECTED Final  ?  Comment: (NOTE) ?The GeneXpert MRSA Assay (FDA approved for NASAL specimens only), ?is one component of a comprehensive MRSA colonization surveillance ?program. It is not intended to diagnose MRSA infection nor to guide ?or monitor treatment for MRSA infections. ?Test performance is not FDA approved in patients less than 2 years ?old. ?Performed at [facility], [place], ?[place] [address] ?  ?Urine Culture     Status: None  ? Collection Time: 04/20/21  5:45 AM  ? Specimen: Urine, Clean Catch  ?Result Value Ref Range Status  ? Specimen Desc

## 2021-04-27 DIAGNOSIS — N184 Chronic kidney disease, stage 4 (severe): Secondary | ICD-10-CM | POA: Diagnosis not present

## 2021-04-27 DIAGNOSIS — N170 Acute kidney failure with tubular necrosis: Secondary | ICD-10-CM | POA: Diagnosis not present

## 2021-04-27 LAB — CBC
HCT: 28.5 % — ABNORMAL LOW (ref 39.0–52.0)
Hemoglobin: 9.6 g/dL — ABNORMAL LOW (ref 13.0–17.0)
MCH: 28.8 pg (ref 26.0–34.0)
MCHC: 33.7 g/dL (ref 30.0–36.0)
MCV: 85.6 fL (ref 80.0–100.0)
Platelets: 146 10*3/uL — ABNORMAL LOW (ref 150–400)
RBC: 3.33 MIL/uL — ABNORMAL LOW (ref 4.22–5.81)
RDW: 13.2 % (ref 11.5–15.5)
WBC: 15 10*3/uL — ABNORMAL HIGH (ref 4.0–10.5)
nRBC: 0 % (ref 0.0–0.2)

## 2021-04-27 LAB — GLUCOSE, CAPILLARY
Glucose-Capillary: 205 mg/dL — ABNORMAL HIGH (ref 70–99)
Glucose-Capillary: 178 mg/dL — ABNORMAL HIGH (ref 70–99)
Glucose-Capillary: 222 mg/dL — ABNORMAL HIGH (ref 70–99)
Glucose-Capillary: 256 mg/dL — ABNORMAL HIGH (ref 70–99)
Glucose-Capillary: 275 mg/dL — ABNORMAL HIGH (ref 70–99)

## 2021-04-27 LAB — BASIC METABOLIC PANEL
Anion gap: 20 — ABNORMAL HIGH (ref 5–15)
BUN: 149 mg/dL — ABNORMAL HIGH (ref 8–23)
CO2: 19 mmol/L — ABNORMAL LOW (ref 22–32)
Calcium: 8 mg/dL — ABNORMAL LOW (ref 8.9–10.3)
Chloride: 93 mmol/L — ABNORMAL LOW (ref 98–111)
Creatinine, Ser: 7.97 mg/dL — ABNORMAL HIGH (ref 0.61–1.24)
GFR, Estimated: 7 mL/min — ABNORMAL LOW (ref >60)
Glucose, Bld: 193 mg/dL — ABNORMAL HIGH (ref 70–99)
Potassium: 3.4 mmol/L — ABNORMAL LOW (ref 3.5–5.1)
Sodium: 132 mmol/L — ABNORMAL LOW (ref 135–145)

## 2021-04-27 LAB — FUNGITELL, SERUM: Fungitell Result: <31 pg/mL (ref <80)

## 2021-04-27 LAB — HISTOPLASMA GAL'MANNAN AG SER: Histoplasma Gal'mannan Ag Ser: <0.5 (ref <0.5)

## 2021-04-27 LAB — MAGNESIUM: Magnesium: 2.7 mg/dL — ABNORMAL HIGH (ref 1.7–2.4)

## 2021-04-27 MED ORDER — PREDNISONE 20 MG PO TABS
60.0000 mg | ORAL_TABLET | Freq: Every day | ORAL | Status: DC
  Administered 2021-04-28 – 2021-04-30 (×3): 60 mg via ORAL
  Filled 2021-04-27 (×3): qty 3

## 2021-04-27 MED ORDER — HEPARIN SODIUM (PORCINE) 1000 UNIT/ML IJ SOLN
INTRAMUSCULAR | Status: AC
  Administered 2021-04-27: 1000 [IU]
  Filled 2021-04-27: qty 10

## 2021-04-27 MED ORDER — METOPROLOL SUCCINATE ER 50 MG PO TB24
150.0000 mg | ORAL_TABLET | Freq: Every day | ORAL | Status: DC
  Administered 2021-04-27 – 2021-04-30 (×4): 150 mg via ORAL
  Filled 2021-04-27 (×3): qty 1

## 2021-04-27 NOTE — Progress Notes (Signed)
Responded to RR page. On arrival, pt RN, RRRN, RT present in room. Pt awake, alert. Was told pt had elevated BP and some extremity twitching. ?

## 2021-04-27 NOTE — Progress Notes (Signed)
Hemodialysis Post Treatment Note: ? ?Tx date: 04/27/2021 ?Tx time: 2.5hours ?Access: Right CVC ?UF Removed: 0 ? ?Note: ?Tolerated treatment well,no adverse effects noted. CVC limbs wrapped with gauze/taped. ? ? ? ? ? ? ? ? ?  ?

## 2021-04-27 NOTE — Progress Notes (Signed)
OT Cancellation Note ? ?Patient Details ?Name: Blake West ?MRN: [id number] ?DOB: 30-Mar-1950 ? ? ?Cancelled Treatment:    Reason Eval/Treat Not Completed: Patient at procedure or test/ unavailable. Pt currently off the floor for HD and unavailable for OT tx at this time. OT to re-attempt at later time/date as able.  ? ?Fredirick Maudlin, OTR/L ?[place] ? ?

## 2021-04-27 NOTE — Progress Notes (Signed)
?Progress Note ? ? ?Patient: Blake West Blake West DOB: Apr 18, 1950 DOA: 04/19/2021     8 ?DOS: the patient was seen and examined on 04/27/2021 ?  ?Brief hospital course: ?[name] is a 71 year old male with history of biventricular heart failure, hypertension, gout, chronic heart failure reduced ejection fraction, history of glomerulonephritis, CKD stage IV, who presents emergency department from outpatient clinic for chief concerns of renal failure.  Patient has been followed by nephrology in the office, recommended work-up for vasculitis, but patient has not been interested per Blake West. ?Patient has been having coughing and shortness of breath for 2 weeks, nausea, vomiting and diarrhea for 1 week. ?Upon arriving the hospital, patient had a serum creatinine of 15, bicarb 8, potassium 6.8.  Chest CT scan showed scattered nodular groundglass airspace disease. ?Patient is treated with bicarb drip, Zithromax, vancomycin and cefepime. ? ? ? ?Assessment and Plan: ?* Acute renal failure superimposed on stage 4 chronic kidney disease (Blake West) ?--Cr 15 on presentation, BUN 247. ?Patient had a chronic kidney disease stage IV, etiology still unclear.  Worsening renal function due to dehydration from diarrhea. ?Patient also has significant hyperkalemia and metabolic acidosis.   ?--there was also concern for vasculitis, and pt has been receiving IV solumedrol ?--dialysis cath placement and started dialysis on 3/30 ?Plan: ?--vasculitis workup per nephro ?--reduce steroid to prednisone 60 mg daily, per nephro ?--f/u kidney biopsy  ?--Plan for rituximab based on biopsy results ?--need to set up outpatient dialysis ? ?Delirium ?Hospital Delirium ?--started to get agitated and combative.  Also noted to not sleep during the night. ?Plan: ?--cont seroquel to 100 mg nightly ?--IV haldol PRN ? ?Acute urinary retention ?--Pt had a foley inserted on presentation, with indication of acute urinary retention. ?--Foley d/c'ed on 4/2,  and pt able to avoid ? ?Acute hypoxemic respiratory failure (Blake West) ?--developed overnight, hypoxic requiring 4L O2.  Likely due to fluid overload from bicarb gtt and ESRD ?Plan: ?--dialysis for fluid removal ? ? ?Malnutrition of moderate degree ?--supplements per dietician  ? ?High anion gap metabolic acidosis ?This is secondary to acute on chronic renal failure.  ?--s/p Na bicarb gtt ? ? ?Chronic atrial fibrillation with RVR (Blake West) ?--increase Toprol to 150 mg daily  ?--d/c cardizem due to hx of sCHF ?--start anticoagulation with Eliquis today ? ?Multifocal pneumonia ?Mild elevation of procalcitonin level in the setting of acute renal failure.  MRSA culture negative, discontinued vancomycin.   ?--completed Rocephin and Zithromax for total 5 day abx ? ?Hyperkalemia ?Potassium has normalized.  This is secondary to acute kidney injury as well as severe metabolic acidosis. ? ?Diarrhea ?Patient has been having diarrhea for 1 week, but none since presentation, so C diff and GI path could not be collected. ? ?Severe sepsis with acute organ dysfunction (Blake West) ?Patient has severe sepsis with tachycardia, tachypnea, leukocytosis and a severe renal failure.  This is most likely due to pneumonia. ?He has received IV fluids.  ? ? ?Chronic HFrEF (heart failure with reduced ejection fraction) (Blake [place]) ?Acute CHF ruled out. ?Reviewed echocardiogram performed 2021, ejection fraction 20 to 25%. ?Patient was volume depleted at time of admission, no exacerbation of congestive heart failure.  ?Patient was not on evidence-based congestive heart failure treatment. ?Plan: ?--increase Toprol to 150 mg daily  ?--Echo to assess current status of CHF ? ?Hypothyroidism ?Continue Synthroid. ? ?Hyperlipidemia ?Not currently treated. ? ?Essential hypertension ?--increase Toprol to 150 mg daily  ?--d/c cardizem due to hx of sCHF ? ? ? ? ?  ? ?  Subjective:  ?No new complaints.  Received dialysis today ? ? ?Physical Exam: ? ?Constitutional: NAD, alert, not  very responsive to questions ?HEENT: conjunctivae and lids normal, EOMI ?CV: No cyanosis.   ?RESP: normal respiratory effort ?SKIN: warm, dry ?Neuro: II - XII grossly intact.   ?Psych: depressed mood and affect.   ?  ? ?Data Reviewed: ? ?Family Communication:  ? ?Disposition: ?Status is: Inpatient ?Remains inpatient appropriate because: need to set up outpatient dialysis  ? ? Planned Discharge Destination: Home when outpatient dialysis center is set up. family declined SNF ? ? ? ?Time spent: 35 minutes ? ?Author: ?Blake West ?04/27/2021 9:33 PM ? ?For on call review [email or web address].  ?

## 2021-04-27 NOTE — Progress Notes (Signed)
Records are being reviewed at [facility] ([place]).  ?

## 2021-04-27 NOTE — Progress Notes (Addendum)
?[facility]  ?ROUNDING NOTE  ? ?Subjective:  ? 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  Patient presents to the emergency department with complaints of nausea, vomiting, generalized decline.  Patient has been admitted for Hyperkalemia [E87.5] ?Atrial fibrillation with RVR ([place]) [I48.91] ?Severe sepsis with acute organ dysfunction ([place]) [A41.9, R65.20] ?Acute renal failure, unspecified acute renal failure type ([place]) [N17.9] ? ? ?Update: ?Patient seen sitting up in bed, nursing asked [place] to bedside due to hypertension. Hypertension believed due to irregular HR and tremors.  ? ?Patient seen and evaluated during dialysis ?  ?HEMODIALYSIS FLOWSHEET: ? ?Blood Flow Rate (mL/min): 250 mL/min ?Arterial Pressure (mmHg): -90 mmHg ?Venous Pressure (mmHg): 80 mmHg ?Transmembrane Pressure (mmHg): 20 mmHg ?Ultrafiltration Rate (mL/min): 200 mL/min ?Dialysate Flow Rate (mL/min): 300 ml/min ?Conductivity: Machine : 13.7 ?Conductivity: Machine : 13.7 ?Dialysis Fluid Bolus: Normal Saline ?Bolus Amount (mL): 250 mL ? ?No complaints at this time ? ? ?Objective:  ?Vital signs in last 24 hours:  ?Temp:  [97.6 ?F (36.4 ?C)-97.9 ?F (36.6 ?C)] 97.6 ?F (36.4 ?C) (04/04 0919) ?Pulse Rate:  [50-107] 50 (04/04 1045) ?Resp:  [12-20] 13 (04/04 1045) ?BP: (107-160)/(67-128) 116/67 (04/04 1045) ?SpO2:  [90 %-98 %] 94 % (04/04 1045) ?Weight:  [85.3 kg] 85.3 kg (04/04 0919) ? ?Weight change:  ?Filed Weights  ? 04/24/21 0957 04/24/21 1235 04/27/21 0919  ?Weight: 84.7 kg 83.6 kg 85.3 kg  ? ? ?Intake/Output: ?I/O last 3 completed shifts: ?In: 306.1 [P.O.:240; IV Piggyback:66.1] ?Out: 1834 [WERXV:4008] ?  ?Intake/Output this shift: ? No intake/output data recorded. ? ?Physical Exam: ?General: NAD  ?Head: Normocephalic, atraumatic.  Dry oral  mucosal membranes  ?Eyes: Anicteric  ?Lungs:  Clear to auscultation, normal effort  ?Heart: Irregular rate and rhythm  ?Abdomen:  Soft, nontender, nondistended  ?Extremities: No peripheral edema.  ?Neurologic: Alert, oriented to self only, moving all four extremities, tremors  ?Skin: No lesions  ?Access: Rt permcath placed on 04/22/21 by Dr Lucky Cowboy  ? ? ?Basic Metabolic Panel: ?Recent Labs  ?Lab 04/23/21 ?0419 04/24/21 ?6761 04/25/21 ?9509 04/26/21 ?3267 04/27/21 ?1245  ?NA 139 139 135 132* 132*  ?K 3.6 3.8 3.7 3.5 3.4*  ?CL 96* 96* 95* 93* 93*  ?CO2 24 26 25  21* 19*  ?GLUCOSE 200* 174* 159* 176* 193*  ?BUN 156* 126* 89* 121* 149*  ?CREATININE 10.24* 8.04* 6.16* 7.54* 7.97*  ?CALCIUM 8.1* 8.5* 8.5* 8.2* 8.0*  ?MG 2.6* 2.5* 2.4 2.5* 2.7*  ? ? ? ?Liver Function Tests: ?Recent Labs  ?Lab 04/21/21 ?8099  ?AST 12*  ?ALT 10  ?ALKPHOS 48  ?BILITOT 1.3*  ?PROT 6.2*  ?ALBUMIN 3.0*  ? ? ?No results for input(s): LIPASE, AMYLASE in the last 168 hours. ? ?No results for input(s): AMMONIA in the last 168 hours. ? ?CBC: ?Recent Labs  ?Lab 04/21/21 ?0434 04/22/21 ?0414 04/23/21 ?0419 04/24/21 ?8338 04/25/21 ?2505 04/26/21 ?3976 04/27/21 ?7341  ?WBC 13.1*   < > 12.2* 13.0* 12.8* 10.8* 15.0*  ?NEUTROABS 10.3*  --   --   --   --   --   --   ?HGB 10.1*   < > 10.0* 10.4* 9.5* 9.0* 9.6*  ?HCT 29.6*   < > 29.0* 31.1* 28.9* 27.1* 28.5*  ?MCV 84.3   < > 83.8 86.9 86.0 86.6 85.6  ?PLT 205   < > 187 176 163  143* 146*  ? < > = values in this interval not displayed.  ? ? ? ?Cardiac Enzymes: ?No results for input(s): CKTOTAL, CKMB, CKMBINDEX, TROPONINI in the last 168 hours. ? ?BNP: ?Invalid input(s): POCBNP ? ?CBG: ?Recent Labs  ?Lab 04/26/21 ?0806 04/26/21 ?1223 04/26/21 ?1655 04/26/21 ?2146 04/27/21 ?0816  ?GLUCAP 194* 152* 247* 245* 205*  ? ? ? ?Microbiology: ?Results for orders placed or performed during the hospital encounter of 04/19/21  ?Resp Panel by RT-PCR (Flu A&B, Covid) Nasopharyngeal Swab     Status: None  ? Collection Time: 04/19/21   9:14 PM  ? Specimen: Nasopharyngeal Swab; Nasopharyngeal(NP) swabs in vial transport medium  ?Result Value Ref Range Status  ? SARS Coronavirus 2 by RT PCR NEGATIVE NEGATIVE Final  ?  Comment: (NOTE) ?SARS-CoV-2 target nucleic acids are NOT DETECTED. ? ?The SARS-CoV-2 RNA is generally detectable in upper respiratory ?specimens during the acute phase of infection. The lowest ?concentration of SARS-CoV-2 viral copies this assay can detect is ?138 copies/mL. A negative result does not preclude SARS-Cov-2 ?infection and should not be used as the sole basis for treatment or ?other patient management decisions. A negative result may occur with  ?improper specimen collection/handling, submission of specimen other ?than nasopharyngeal swab, presence of viral mutation(s) within the ?areas targeted by this assay, and inadequate number of viral ?copies(<138 copies/mL). A negative result must be combined with ?clinical observations, patient history, and epidemiological ?information. The expected result is Negative. ? ?Fact Sheet for Patients:  ?EntrepreneurPulse.com.au ? ?Fact Sheet for Healthcare Providers:  ?IncredibleEmployment.be ? ?This test is no t yet approved or cleared by the [place] FDA and  ?has been authorized for detection and/or diagnosis of SARS-CoV-2 by ?FDA under an Emergency Use Authorization (EUA). This EUA will remain  ?in effect (meaning this test can be used) for the duration of the ?COVID-19 declaration under Section 564(b)(1) of the Act, 21 ?U.S.C.section 360bbb-3(b)(1), unless the authorization is terminated  ?or revoked sooner.  ? ? ?  ? Influenza A by PCR NEGATIVE NEGATIVE Final  ? Influenza B by PCR NEGATIVE NEGATIVE Final  ?  Comment: (NOTE) ?The Xpert Xpress SARS-CoV-2/FLU/RSV plus assay is intended as an aid ?in the diagnosis of influenza from Nasopharyngeal swab specimens and ?should not be used as a sole basis for treatment. Nasal washings and ?aspirates  are unacceptable for Xpert Xpress SARS-CoV-2/FLU/RSV ?testing. ? ?Fact Sheet for Patients: ?EntrepreneurPulse.com.au ? ?Fact Sheet for Healthcare Providers: ?IncredibleEmployment.be ? ?This test is not yet approved or cleared by the [place] FDA and ?has been authorized for detection and/or diagnosis of SARS-CoV-2 by ?FDA under an Emergency Use Authorization (EUA). This EUA will remain ?in effect (meaning this test can be used) for the duration of the ?COVID-19 declaration under Section 564(b)(1) of the Act, 21 U.S.C. ?section 360bbb-3(b)(1), unless the authorization is terminated or ?revoked. ? ?Performed at [facility], [place], ?[place] [address] ?  ?Culture, blood (routine x 2)     Status: None  ? Collection Time: 04/19/21  9:45 PM  ? Specimen: BLOOD  ?Result Value Ref Range Status  ? Specimen Description BLOOD RIGHT ANTECUBITAL  Final  ? Special Requests   Final  ?  BOTTLES DRAWN AEROBIC AND ANAEROBIC Blood Culture adequate volume  ? Culture   Final  ?  NO GROWTH 5 DAYS ?Performed at [facility], [address], [place], [place] [address] ?  ? Report Status 04/24/2021 FINAL  Final  ?Culture, blood (routine x  2)     Status: None  ? Collection Time: 04/19/21 10:32 PM  ? Specimen: BLOOD  ?Result Value Ref Range Status  ? Specimen Description BLOOD LEFT ASSIST CONTROL  Final  ? Special Requests   Final  ?  BOTTLES DRAWN AEROBIC AND ANAEROBIC Blood Culture adequate volume  ? Culture   Final  ?  NO GROWTH 5 DAYS ?Performed at [facility], [address] [place], [place] [address] ?  ? Report Status 04/24/2021 FINAL  Final  ?MRSA Next Gen by PCR, Nasal     Status: None  ? Collection Time: 04/20/21  2:21 AM  ? Specimen: Nasal Mucosa; Nasal Swab  ?Result Value Ref Range Status  ? MRSA by PCR Next Gen NOT DETECTED NOT DETECTED Final  ?  Comment: (NOTE) ?The GeneXpert MRSA Assay (FDA approved for NASAL specimens only), ?is one  component of a comprehensive MRSA colonization surveillance ?program. It is not intended to diagnose MRSA infection nor to guide ?or monitor treatment for MRSA infections. ?Test performance is not FDA approved

## 2021-04-27 NOTE — TOC Progression Note (Signed)
Transition of Care (TOC) - Progression Note  ? ? ?Patient Details  ?Name: NORRIS BRUMBACH ?MRN: [id number] ?Date of Birth: 1950-05-01 ? ?Transition of Care (TOC) CM/SW Contact  ?Beverly Sessions, RN ?Phone Number: ?04/27/2021, 3:37 PM ? ?Clinical Narrative:    ? ?Met with patient and wife at bedside  ?They confirm plan remains home with home health  ?WC and BSC to be delivered to room by [employer].  Per Faythe Dingwall with [employer] RW will be delivered to the home. ? ?Wife request assistance applying for Medicaid.  Message sent to financial counseling  ? ?Expected Discharge Plan: [place] ?Barriers to Discharge: Continued Medical Work up ? ?Expected Discharge Plan and Services ?Expected Discharge Plan: [place] ?  ?  ?Post Acute Care Choice: Home Health, Durable Medical Equipment ?Living arrangements for the past 2 months: [place] ?                ?  ?  ?  ?  ?  ?  ?  ?  ?  ?  ? ? ?Social Determinants of Health (SDOH) Interventions ?  ? ?Readmission Risk Interventions ? ?  04/22/2021  ? 12:24 PM  ?Readmission Risk Prevention Plan  ?Transportation Screening Complete  ?PCP or Specialist Appt within 3-5 Days Complete  ?Social Work Consult for [place] Planning/Counseling Complete  ?Palliative Care Screening Not Applicable  ?Medication Review Press photographer) Complete  ? ? ?

## 2021-04-27 NOTE — Progress Notes (Signed)
Physical Therapy Treatment ?Patient Details ?Name: Blake West ?MRN: [id number] ?DOB: 01/30/1950 ?Today's Date: 04/27/2021 ? ? ?History of Present Illness 71 year old male with history of biventricular heart failure, hypertension, gout, chronic heart failure reduced ejection fraction, history of glomerulonephritis, CKD stage IV, who presents emergency department from outpatient clinic for chief concerns of renal failure.  Patient has been having coughing and shortness of breath for 2 weeks, nausea, vomiting and diarrhea for 1 week. Pt admitted for severe sepsis with acute organ dysfunction most likely due to pneumonia ? ?  ?PT Comments  ? ? Pt is making limited progress towards goals. Pt encouraged to ambulate in hallway, wife agreed to follow with recliner. Pt only able to tolerate in room distance this date prior to quick fatigue. Still remains on 4L of O2 and asks to remain seated at EOB to eat lunch. Extensive conversation regarding home set up with wife in room including use of WC for long distances and possible EMS transfer in/out of home. Wife reports between her family, she thinks she can get him up the stairs. Pt unable to demonstrate endurance for stair training this date. Will continue to progress as able.  ?Recommendations for follow up therapy are one component of a multi-disciplinary discharge planning process, led by the attending physician.  Recommendations may be updated based on patient status, additional functional criteria and insurance authorization. ? ?Follow Up Recommendations ? Skilled nursing-short term rehab (<3 hours/day) ?  ?  ?Assistance Recommended at Discharge Frequent or constant Supervision/Assistance  ?Patient can return home with the following Help with stairs or ramp for entrance;Assist for transportation;A little help with bathing/dressing/bathroom;A lot of help with walking and/or transfers;Assistance with cooking/housework ?  ?Equipment Recommendations ? Rolling walker (2  wheels);BSC/3in1;Wheelchair (measurements PT);Wheelchair cushion (measurements PT)  ?  ?Recommendations for Other Services   ? ? ?  ?Precautions / Restrictions Precautions ?Precautions: Fall ?Restrictions ?Weight Bearing Restrictions: No  ?  ? ?Mobility ? Bed Mobility ?Overal bed mobility: Needs Assistance ?Bed Mobility: Supine to Sit ?  ?  ?Supine to sit: Supervision ?  ?  ?General bed mobility comments: increased time, however no physical assist required. Once seated at EOB, upright posture noted. All mobility performed on 4L of O2 with sats ranging from 88-91%. ?  ? ?Transfers ?Overall transfer level: Needs assistance ?Equipment used: Rolling walker (2 wheels) ?Transfers: Sit to/from Stand ?Sit to Stand: Min guard ?  ?  ?  ?  ?  ?General transfer comment: practiced standing x 2 reps. Cues for hand placement. RW used with safe technique. Once standing, upright posture noted ?  ? ?Ambulation/Gait ?Ambulation/Gait assistance: Min guard ?Gait Distance (Feet): 10 Feet ?Assistive device: Rolling walker (2 wheels) ?Gait Pattern/deviations: Decreased step length - right, Decreased step length - left, Decreased stride length, Trunk flexed, Narrow base of support ?  ?  ?  ?General Gait Details: quick fatigue and poor endurance. RW used with wife following with recliner. Unable to ambulate out into hallway this date. ? ? ?Stairs ?  ?  ?  ?  ?  ? ? ?Wheelchair Mobility ?  ? ?Modified Rankin (Stroke Patients Only) ?  ? ? ?  ?Balance Overall balance assessment: Needs assistance ?Sitting-balance support: Feet supported, Bilateral upper extremity supported ?Sitting balance-Leahy Scale: Fair ?  ?  ?Standing balance support: Bilateral upper extremity supported, During functional activity ?Standing balance-Leahy Scale: Fair ?  ?  ?  ?  ?  ?  ?  ?  ?  ?  ?  ?  ?  ? ?  ?  Cognition Arousal/Alertness: Awake/alert ?Behavior During Therapy: Flat affect ?Overall Cognitive Status: Within Functional Limits for tasks assessed ?  ?  ?  ?  ?  ?   ?  ?  ?  ?  ?  ?  ?  ?  ?  ?  ?General Comments: pt is sleepy, just returned from HD. Agreeable to session and is pleasant. ?  ?  ? ?  ?Exercises Other Exercises ?Other Exercises: left sitting at EOB with lunch tray. Good sitting tolerance without back support and O2 sats WNL. ? ?  ?General Comments   ?  ?  ? ?Pertinent Vitals/Pain Pain Assessment ?Pain Assessment: No/denies pain  ? ? ?Home Living   ?  ?  ?  ?  ?  ?  ?  ?  ?  ?   ?  ?Prior Function    ?  ?  ?   ? ?PT Goals (current goals can now be found in the care plan section) Acute Rehab PT Goals ?Patient Stated Goal: to get better ?PT Goal Formulation: With patient ?Time For Goal Achievement: 05/05/21 ?Potential to Achieve Goals: Good ?Progress towards PT goals: Progressing toward goals ? ?  ?Frequency ? ? ? Min 2X/week ? ? ? ?  ?PT Plan Current plan remains appropriate  ? ? ?Co-evaluation   ?  ?  ?  ?  ? ?  ?AM-PAC PT "6 Clicks" Mobility   ?Outcome Measure ? Help needed turning from your back to your side while in a flat bed without using bedrails?: A Little ?Help needed moving from lying on your back to sitting on the side of a flat bed without using bedrails?: A Little ?Help needed moving to and from a bed to a chair (including a wheelchair)?: A Lot ?Help needed standing up from a chair using your arms (e.g., wheelchair or bedside chair)?: A Little ?Help needed to walk in hospital room?: A Lot ?Help needed climbing 3-5 steps with a railing? : A Lot ?6 Click Score: 15 ? ?  ?End of Session Equipment Utilized During Treatment: Gait belt;Oxygen ?Activity Tolerance: Patient limited by fatigue ?Patient left: in bed;with bed alarm set;with family/visitor present (seated at EOB with alarm engaged) ?Nurse Communication: Mobility status ?PT Visit Diagnosis: Unsteadiness on feet (R26.81);Muscle weakness (generalized) (M62.81);Difficulty in walking, not elsewhere classified (R26.2);Dizziness and giddiness (R42);Pain ?  ? ? ?Time: 8546-2703 ?PT Time Calculation (min)  (ACUTE ONLY): 19 min ? ?Charges:  $Gait Training: 8-22 mins          ?          ? ?Greggory Stallion, PT, DPT, GCS ?[phone] ? ? ? ?Brentlee Delage ?04/27/2021, 4:22 PM ? ?

## 2021-04-27 NOTE — Progress Notes (Signed)
? ? ? ?PULMONOLOGY ? ? ? ? ? ? ? ? ?Date: 04/27/2021,   ?MRN# [id number] Blake West [birth year] ? ? ?  ?AdmissionWeight: 83 kg                 ?CurrentWeight: 84.9 kg (bedscale) ? ?Referring provider: Dr Candiss Norse ? ? ?CHIEF COMPLAINT:  ? ?Anca + vasculitis  ? ? ?HISTORY OF PRESENT ILLNESS  ? ?This is a pleasant 71 year old male with a history of CHF with reduced EF, coronary disease gout, dyslipidemia essential hypertension, hypothyroidism testicular dysfunction, type 2 diabetes, history of glomerulonephritis with CKD stage IV came in from outpatient clinic due to provider concerns for ongoing progressive renal failure.  Patient is essentially a lifelong non-smoker reporting smoking in the 1950s.  He does report mild and subacute and chronic cough with expectoration of whitish phlegm.  Patient reports decreased appetite as well as loose stools for less than 1 week prior to arrival.  In the emergency department patient had accelerated hypertension with tachycardia however normoxia on room air.  His initial creatinine was 15 with a GFR of 3 remainder of blood work was essentially unremarkable besides electrolyte derangements including potassium 6.8 without EKG changes.  He was negative for COVID influenza a and B on arrival.  He had chest imaging done without contrast which showed nodular groundglass airspace opacification of the lung.  There was concern for pneumonia and patient is empirically treated with cefepime and vancomycin and Zithromax on arrival.  Also received Lokelma for elevated potassium.  Spinal this patient was lucid without altered mentation.  CT imaging with lung windows were independently reviewed by me with findings of chronic bronchitic changes as well as punctate multifocal bilateral groundglass nodules these findings are nonspecific and can be seen with atypical infection, viral lower respiratory tract infection as well as inflammatory lung disease. ? ? ?04/23/21- patient resting in bed with family  at bedside during my evaluation. Patient is in no distress with nasal canula off his face normoxic on room air. He ate today with better appetite and was able to get up OOB to bathroom.  Family shares he seems to be feeling better overall. Wife shares the right clavicle oppposite side of trialysis catheter seems to appear different then usual and requests imaging to be done.  I have ordered CXR.  Noted CRP with moderate elevation. Infectious workup in progress , blood work reviewed with improved GFR post HD, mild hyperglycemia and leukocytosis post pulse dose steriods, no overnight events. Appreciate everyone caring for this patient. No changes to therapy from pulm perspective.  ? ?04/27/21- patient seems to be better he walked around without O2 but has episodes of hypoxemia .  He is on 4L at rest but when ambulatory is on room air. Infectious workup is negative thus far ? ?PAST MEDICAL HISTORY  ? ?Past Medical History:  ?Diagnosis Date  ? Abnormal EKG   ? Biventricular failure ([place])   ? CAD (coronary artery disease)   ? Colorectal polyps   ? Gout   ? Hyperlipidemia LDL goal <70   ? Hypertension   ? Hypothyroidism   ? Kidney stone   ? Pneumonia   ? Testicular hypofunction   ? Type 2 diabetes mellitus ([place]) 04/26/2019  ? ? ? ?SURGICAL HISTORY  ? ?Past Surgical History:  ?Procedure Laterality Date  ? CARDIAC CATHETERIZATION    ? DIALYSIS/PERMA CATHETER INSERTION N/A 04/22/2021  ? Procedure: DIALYSIS/PERMA CATHETER INSERTION;  Surgeon: Algernon Huxley, MD;  Location:  [facility];  Service: Cardiovascular;  Laterality: N/A;  ? FOOT SURGERY    ? RIGHT/LEFT HEART CATH AND CORONARY ANGIOGRAPHY N/A 04/30/2019  ? Procedure: RIGHT/LEFT HEART CATH AND CORONARY ANGIOGRAPHY;  Surgeon: Troy Sine, MD;  Location: [facility];  Service: Cardiovascular;  Laterality: N/A;  ? ? ? ?FAMILY HISTORY  ? ?Family History  ?Problem Relation Age of Onset  ? Heart attack Mother 62  ? Pulmonary fibrosis Father   ? Congestive Heart  Failure Brother   ? Heart attack Brother   ? Heart attack Brother   ? Early death Sister   ? Kidney disease Paternal Grandfather   ? Diabetes Sister   ? ? ? ?SOCIAL HISTORY  ? ?Social History  ? ?Tobacco Use  ? Smoking status: Former  ?  Packs/day: 1.00  ?  Years: 30.00  ?  Pack years: 30.00  ?  Types: Cigarettes  ?  Quit date: 01/24/2002  ?  Years since quitting: 19.2  ? Smokeless tobacco: Never  ?Vaping Use  ? Vaping Use: Never used  ?Substance Use Topics  ? Alcohol use: No  ? Drug use: No  ? ? ? ?MEDICATIONS  ? ? ?Home Medication:  ?  ?Current Medication: ? ?Current Facility-Administered Medications:  ?  (feeding supplement) PROSource Plus liquid 30 mL, 30 mL, Oral, TID BM, Enzo Bi, MD, 30 mL at 04/26/21 2133 ?  0.9 %  sodium chloride infusion, 100 mL, Intravenous, PRN, Lucky Cowboy, Erskine Squibb, MD ?  0.9 %  sodium chloride infusion, 100 mL, Intravenous, PRN, Lucky Cowboy, Erskine Squibb, MD ?  acetaminophen (TYLENOL) tablet 650 mg, 650 mg, Oral, Q6H PRN, 650 mg at 04/26/21 1619 **OR** acetaminophen (TYLENOL) suppository 650 mg, 650 mg, Rectal, Q6H PRN, Algernon Huxley, MD ?  acidophilus (RISAQUAD) capsule 2 capsule, 2 capsule, Oral, TID, Dew, Erskine Squibb, MD, 2 capsule at 04/26/21 2129 ?  albuterol (PROVENTIL) (2.5 MG/3ML) 0.083% nebulizer solution 2.5 mg, 2.5 mg, Nebulization, Q6H PRN, Lucky Cowboy, Erskine Squibb, MD ?  alteplase (CATHFLO ACTIVASE) injection 2 mg, 2 mg, Intracatheter, Once PRN, Algernon Huxley, MD ?  alum & mag hydroxide-simeth (MAALOX/MYLANTA) 200-200-20 MG/5ML suspension 15 mL, 15 mL, Oral, Q6H PRN, Algernon Huxley, MD, 15 mL at 04/23/21 1512 ?  apixaban (ELIQUIS) tablet 5 mg, 5 mg, Oral, BID, Enzo Bi, MD, 5 mg at 04/27/21 1426 ?  budesonide (PULMICORT) nebulizer solution 0.5 mg, 0.5 mg, Nebulization, Q6H PRN, Lucky Cowboy, Erskine Squibb, MD ?  calcium carbonate (TUMS - dosed in mg elemental calcium) chewable tablet 200 mg of elemental calcium, 1 tablet, Oral, TID PRN, Lucky Cowboy, Erskine Squibb, MD ?  Chlorhexidine Gluconate Cloth 2 % PADS 6 each, 6 each, Topical, Daily,  Dew, Erskine Squibb, MD, 6 each at 04/27/21 1612 ?  guaiFENesin (MUCINEX) 12 hr tablet 600 mg, 600 mg, Oral, BID, Dew, Erskine Squibb, MD, 600 mg at 04/27/21 1426 ?  haloperidol lactate (HALDOL) injection 2 mg, 2 mg, Intravenous, Q6H PRN, Enzo Bi, MD, 2 mg at 04/25/21 0416 ?  heparin injection 1,000 Units, 1,000 Units, Dialysis, PRN, Algernon Huxley, MD, 1,000 Units at 04/27/21 1154 ?  insulin aspart (novoLOG) injection 0-5 Units, 0-5 Units, Subcutaneous, QHS, Algernon Huxley, MD, 2 Units at 04/26/21 2259 ?  insulin aspart (novoLOG) injection 0-9 Units, 0-9 Units, Subcutaneous, TID WC, Algernon Huxley, MD, 2 Units at 04/27/21 1417 ?  levothyroxine (SYNTHROID) tablet 25 mcg, 25 mcg, Oral, Q0600, Algernon Huxley, MD, 25 mcg at 04/27/21  0543 ?  lidocaine (PF) (XYLOCAINE) 1 % injection 5 mL, 5 mL, Intradermal, PRN, Lucky Cowboy, Erskine Squibb, MD ?  lidocaine-prilocaine (EMLA) cream 1 application., 1 application., Topical, PRN, Lucky Cowboy, Erskine Squibb, MD ?  MEDLINE mouth rinse, 15 mL, Mouth Rinse, BID, Enzo Bi, MD, 15 mL at 04/27/21 0830 ?  metoprolol succinate (TOPROL-XL) 24 hr tablet 150 mg, 150 mg, Oral, Daily, Enzo Bi, MD, 150 mg at 04/27/21 1426 ?  metoprolol tartrate (LOPRESSOR) injection 5 mg, 5 mg, Intravenous, Q2H PRN, Lucky Cowboy, Erskine Squibb, MD ?  multivitamin (RENA-VIT) tablet 1 tablet, 1 tablet, Oral, QHS, Enzo Bi, MD, 1 tablet at 04/26/21 2129 ?  ondansetron (ZOFRAN) tablet 4 mg, 4 mg, Oral, Q6H PRN **OR** ondansetron (ZOFRAN) injection 4 mg, 4 mg, Intravenous, Q6H PRN, Algernon Huxley, MD, 4 mg at 04/22/21 1036 ?  ondansetron (ZOFRAN) injection 4 mg, 4 mg, Intravenous, Q6H PRN, Algernon Huxley, MD, 4 mg at 04/23/21 0546 ?  pentafluoroprop-tetrafluoroeth (GEBAUERS) aerosol 1 application., 1 application., Topical, PRN, Algernon Huxley, MD ?  Derrill Memo ON 04/28/2021] predniSONE (DELTASONE) tablet 60 mg, 60 mg, Oral, Q breakfast, Breeze, Shantelle, NP ?  prochlorperazine (COMPAZINE) injection 10 mg, 10 mg, Intravenous, Q6H PRN, Enzo Bi, MD, 10 mg at 04/23/21 1919 ?   QUEtiapine (SEROQUEL) tablet 100 mg, 100 mg, Oral, QHS, Enzo Bi, MD, 100 mg at 04/26/21 2129 ? ? ? ?ALLERGIES  ? ?Codeine ? ? ? ? ?REVIEW OF SYSTEMS  ? ? ?Review of Systems: ? ?Gen:  Denies  fever, sweats, chil

## 2021-04-27 NOTE — Significant Event (Signed)
Rapid Response Event Note  ? ?Reason for Call :  ?High BP ? ?Initial Focused Assessment:  ?Rapid response RN arrived in patient's room with patient sitting up in bed with family at bedside. Patient with BP being taken in leg upon arrival. Per patient's nurse and family, patient had several BPs taken which showed SBP in 200s with the dinamap. When BP finished on patient's leg it showed a BP of 149/92. Patient alert with no complaints at this time. Does appear to have an irregular twitch in bilateral upper arms. Family reports that started when patient was admitted to hospital. Bilateral radial pulses at +2 in strength. HR 90s afib, oxygen saturations 94% on 4L nasal cannula. Respirations even and unlabored. ? ?Interventions:  ?Took manual BP to confirm BP obtained in patient's leg. Maunal BP in left arm also had SBP in 845X and diastolic in 64W. Patient's arms did twitch during measurement which made listening for measurement difficult even with a manual cuff. Dr. Holley Raring confirmed no need for medication administration at this time due to plan to dialyze patient shortly. ? ?Plan of Care:  ?Patient to proceed to dialysis unit for dialysis. ? ?Event Summary:  ? ?MD Notified: Dr. Holley Raring ?Call Time: 08:27 ?Arrival Time: 08:29 ?End Time: 08:39 ? ?Stephanie Acre, RN ?

## 2021-04-28 ENCOUNTER — Inpatient Hospital Stay (HOSPITAL_COMMUNITY)
Admit: 2021-04-28 | Discharge: 2021-04-28 | Disposition: A | Discharge status: Home / Self Care | Payer: PPO | Attending: Hospitalist | Admitting: Hospitalist

## 2021-04-28 DIAGNOSIS — I7782 Antineutrophilic cytoplasmic antibody (ANCA) vasculitis: Secondary | ICD-10-CM

## 2021-04-28 DIAGNOSIS — I4891 Unspecified atrial fibrillation: Secondary | ICD-10-CM

## 2021-04-28 DIAGNOSIS — Z992 Dependence on renal dialysis: Secondary | ICD-10-CM

## 2021-04-28 DIAGNOSIS — N170 Acute kidney failure with tubular necrosis: Secondary | ICD-10-CM | POA: Diagnosis not present

## 2021-04-28 DIAGNOSIS — I5022 Chronic systolic (congestive) heart failure: Secondary | ICD-10-CM | POA: Diagnosis not present

## 2021-04-28 DIAGNOSIS — N186 End stage renal disease: Secondary | ICD-10-CM

## 2021-04-28 LAB — GLUCOSE, CAPILLARY
Glucose-Capillary: 148 mg/dL — ABNORMAL HIGH (ref 70–99)
Glucose-Capillary: 244 mg/dL — ABNORMAL HIGH (ref 70–99)
Glucose-Capillary: 248 mg/dL — ABNORMAL HIGH (ref 70–99)
Glucose-Capillary: 192 mg/dL — ABNORMAL HIGH (ref 70–99)

## 2021-04-28 LAB — ECHOCARDIOGRAM COMPLETE
AR max vel: 3.58 cm2
AV Area VTI: 3.38 cm2
AV Area mean vel: 3.05 cm2
AV Mean grad: 2 mmHg
AV Peak grad: 4.2 mmHg
Ao pk vel: 1.02 m/s
Area-P 1/2: 4.25 cm2
Height: 71 in
MV VTI: 1.86 cm2
S' Lateral: 3.27 cm
Weight: 2994.73 oz

## 2021-04-28 LAB — CBC
HCT: 26.7 % — ABNORMAL LOW (ref 39.0–52.0)
Hemoglobin: 8.8 g/dL — ABNORMAL LOW (ref 13.0–17.0)
MCH: 28.6 pg (ref 26.0–34.0)
MCHC: 33 g/dL (ref 30.0–36.0)
MCV: 86.7 fL (ref 80.0–100.0)
Platelets: 160 10*3/uL (ref 150–400)
RBC: 3.08 MIL/uL — ABNORMAL LOW (ref 4.22–5.81)
RDW: 13.4 % (ref 11.5–15.5)
WBC: 18.9 10*3/uL — ABNORMAL HIGH (ref 4.0–10.5)
nRBC: 0 % (ref 0.0–0.2)

## 2021-04-28 LAB — BASIC METABOLIC PANEL
Anion gap: 14 (ref 5–15)
BUN: 123 mg/dL — ABNORMAL HIGH (ref 8–23)
CO2: 22 mmol/L (ref 22–32)
Calcium: 7.8 mg/dL — ABNORMAL LOW (ref 8.9–10.3)
Chloride: 99 mmol/L (ref 98–111)
Creatinine, Ser: 5.91 mg/dL — ABNORMAL HIGH (ref 0.61–1.24)
GFR, Estimated: 10 mL/min — ABNORMAL LOW (ref >60)
Glucose, Bld: 155 mg/dL — ABNORMAL HIGH (ref 70–99)
Potassium: 3.4 mmol/L — ABNORMAL LOW (ref 3.5–5.1)
Sodium: 135 mmol/L (ref 135–145)

## 2021-04-28 LAB — ASPERGILLUS ANTIGEN, BAL/SERUM: Aspergillus Ag, BAL/Serum: 0.03 Index (ref 0.00–0.49)

## 2021-04-28 LAB — MAGNESIUM: Magnesium: 2.1 mg/dL (ref 1.7–2.4)

## 2021-04-28 MED ORDER — PERFLUTREN LIPID MICROSPHERE
1.0000 mL | INTRAVENOUS | Status: AC | PRN
  Administered 2021-04-28: 2 mL via INTRAVENOUS
  Filled 2021-04-28: qty 10

## 2021-04-28 NOTE — Progress Notes (Signed)
*  PRELIMINARY RESULTS* ?Echocardiogram ?2D Echocardiogram has been performed. ? ?Blake West ?04/28/2021, 3:07 PM ?

## 2021-04-28 NOTE — Assessment & Plan Note (Addendum)
Prior to hospital presentation was at stage IV chronic kidney disease, and did have previous history of glomerulonephritis.  Patient felt to have ANCA vasculitis.  Now currently on dialysis.  Status post dialysis catheter placement.  Patient has been set up to start dialysis at [facility] on Monday, 4/10.  Last dialysis session was Thursday, 4/6.  Nephrology confirmed okay to wait until Monday for next dialysis. ? ?-vasculitis workup per nephro.  Decreasing steroids.  Kidney biopsy done 4/3 confirming ANCA vasculitis.  Patient started on rituximab and steroids.   ?

## 2021-04-28 NOTE — Progress Notes (Signed)
Occupational Therapy Treatment ?Patient Details ?Name: Blake West ?MRN: [id number] ?DOB: [birth year] ?Today's Date: 04/28/2021 ? ? ?History of present illness 71 year old male with history of biventricular heart failure, hypertension, gout, chronic heart failure reduced ejection fraction, history of glomerulonephritis, CKD stage IV, who presents emergency department from outpatient clinic for chief concerns of renal failure.  Patient has been having coughing and shortness of breath for 2 weeks, nausea, vomiting and diarrhea for 1 week. Pt admitted for severe sepsis with acute organ dysfunction most likely due to pneumonia ?  ?OT comments ? Pt seen for OT treatment on this date. Upon arrival to room, pt awake and seated upright in bed with wife present. Pt more alert this date and agreeable to OT tx. Pt currently requires MIN GUARD for seated LB dressing, MIN GUARD-MIN A for functional mobility of short household distances (66ft, 2x) with RW, MOD A for toilet transfer (BSC frame over toilet), MIN A for clothing management during sit>stand toilet transfer, and MIN GUARD for standing grooming tasks d/t decreased strength, balance, and activity tolerance. Pt only able to tolerate standing for ~20sec to perform each grooming tasks before requiring seated rest break. Pt continues to benefit from skilled OT services to maximize return to PLOF and minimize risk of future falls, injury, caregiver burden, and readmission. Pt is making great progress towards goals. Wife present throughout session and confirming she is able to provide level of assist pt currently requires for ADLs/functional mobility. Discharge recommendation updated to [facility] to reflect pt's progress. ? ?Of note, pt trialed on RA during session: ?SpO2 on room air at rest while sitting EOB = 91% ?SpO2 following ambulation and toilet transfer = 84% (increased to 91% following 30 sec of PLB) ? ?At end of session, Blake West donned (4L/min of O2) and pt left sitting upright  in bed with SpO2 92%. RN informed ?  ? ?Recommendations for follow up therapy are one component of a multi-disciplinary discharge planning process, led by the attending physician.  Recommendations may be updated based on patient status, additional functional criteria and insurance authorization. ?   ?Follow Up Recommendations ? Home health OT  ?  ?Assistance Recommended at Discharge Frequent or constant Supervision/Assistance  ?Patient can return home with the following ? A lot of help with bathing/dressing/bathroom;A lot of help with walking and/or transfers ?  ?Equipment Recommendations ? BSC/3in1  ?  ?   ?Precautions / Restrictions Precautions ?Precautions: Fall ?Restrictions ?Weight Bearing Restrictions: No  ? ? ?  ? ?Mobility Bed Mobility ?Overal bed mobility: Needs Assistance ?Bed Mobility: Supine to Sit, Sit to Supine ?  ?  ?Supine to sit: Supervision ?Sit to supine: Supervision ?  ?  ?  ? ?Transfers ?Overall transfer level: Needs assistance ?Equipment used: Rolling walker (2 wheels) ?Transfers: Sit to/from Stand ?Sit to Stand: Min guard, Mod assist ?  ?  ?  ?  ?  ?General transfer comment: Requires MIN GUARD from EOB, and MOD A from/to BSC. Requires cues for safe hand placement with RW use ?  ?  ?Balance Overall balance assessment: Needs assistance ?Sitting-balance support: No upper extremity supported, Feet supported ?Sitting balance-Leahy Scale: Fair ?Sitting balance - Comments: requires MIN GUARD for reaching outside BOS to don/doff socks ?  ?Standing balance support: Bilateral upper extremity supported, During functional activity ?Standing balance-Leahy Scale: Fair ?Standing balance comment: x1 LOB requiring MIN A to regain balance, otherwise requires MIN GUARD ?  ?  ?  ?  ?  ?  ?  ?  ?  ?  ?  ?   ? ?  ADL either performed or assessed with clinical judgement  ? ?ADL Overall ADL's : Needs assistance/impaired ?  ?  ?Grooming: Wash/dry hands;Brushing hair;Min guard;Standing ?  ?  ?  ?  ?  ?  ?  ?Lower Body  Dressing: Min guard;Sitting/lateral leans ?Lower Body Dressing Details (indicate cue type and reason): to don/doff socks while seated EOB. ?Toilet Transfer: Moderate assistance;Ambulation;BSC/3in1;Rolling walker (2 wheels) (BSC frame over toilet) ?Toilet Transfer Details (indicate cue type and reason): Requires MOD A to align hips with BSC and ensure controlled descent ?Toileting- Clothing Manipulation and Hygiene: Minimal assistance;Sit to/from stand ?Toileting - Clothing Manipulation Details (indicate cue type and reason): MIN A for steadying while pulling pants off/over hips ?  ?  ?Functional mobility during ADLs: Min guard;Minimal assistance;Rolling walker (2 wheels) (to walk 25ft, 2x) ?  ?  ? ? ? ?Cognition Arousal/Alertness: Awake/alert ?Behavior During Therapy: Flat affect ?Overall Cognitive Status: Impaired/Different from baseline ?  ?  ?  ?  ?  ?  ?  ?  ?  ?  ?  ?  ?  ?  ?  ?  ?General Comments: more alert this session and talkitive. Continues to present with decreased safety awareness ?  ?  ?   ?   ?   ?General Comments While on RA, SpO2 desat 84% following functional mobility and toilet transfer  ? ? ?Pertinent Vitals/ Pain       Pain Assessment ?Pain Assessment: No/denies pain ? ?   ?   ? ?Frequency ? Min 2X/week  ? ? ? ? ?  ?Progress Toward Goals ? ?OT Goals(current goals can now be found in the care plan section) ? Progress towards OT goals: Progressing toward goals ? ?Acute Rehab OT Goals ?Patient Stated Goal: to get stronger ?OT Goal Formulation: With patient/family ?Time For Goal Achievement: 05/05/21 ?Potential to Achieve Goals: Good  ?Plan Discharge plan needs to be updated;Frequency remains appropriate   ? ?   ?AM-PAC OT "6 Clicks" Daily Activity     ?Outcome Measure ? ? Help from another person eating meals?: None ?Help from another person taking care of personal grooming?: A Little ?Help from another person toileting, which includes using toliet, bedpan, or urinal?: A Lot ?Help from another  person bathing (including washing, rinsing, drying)?: A Lot ?Help from another person to put on and taking off regular upper body clothing?: None ?Help from another person to put on and taking off regular lower body clothing?: A Little ?6 Click Score: 18 ? ?  ?End of Session Equipment Utilized During Treatment: Rolling walker (2 wheels);Oxygen ? ?OT Visit Diagnosis: Unsteadiness on feet (R26.81);Muscle weakness (generalized) (M62.81) ?  ?Activity Tolerance Patient tolerated treatment well ?  ?Patient Left in bed;with call bell/phone within reach;with bed alarm set;with family/visitor present ?  ?Nurse Communication Mobility status ?  ? ?   ? ?Time: 4827-0786 ?OT Time Calculation (min): 19 min ? ?Charges: OT General Charges ?$OT Visit: 1 Visit ?OT Treatments ?$Self Care/Home Management : 8-22 mins ? ?Fredirick Maudlin, OTR/L ?[place] ? ?

## 2021-04-28 NOTE — Progress Notes (Signed)
[facility] Progress Note ? ?Patient: Blake West    CZY:[id number]  DOA: 04/19/2021    ?Date of Service: the patient was seen and examined on 04/28/2021 ? ?Brief hospital course: ?71 year old male with past medical history of chronic systolic heart failure, stage IV chronic kidney disease, history of glomerulonephritis who presented to the emergency room on 3/27 after being sent over from his nephrologist with lab work noting hyperkalemia and worsening renal function.  Patient found to have a creatinine of 15.  Chest x-ray also noted scattered nodular airspace disease concerning for pneumonia.  Patient admitted to the hospital service and started on antibiotics.  Nephrology consulted and work-up underway for vasculitis.  Renal function continued to decline and patient had dialysis catheter placed and started dialysis on 3/30.  Hospital course complicated by urinary retention which has since resolved and treatment of multifocal pneumonia. ? ? ? ?Assessment and Plan: ?Assessment and Plan: ?ESRD (end stage renal disease) on dialysis [facility]) ?Prior to hospital presentation was at stage IV chronic kidney disease, and did have previous history of glomerulonephritis.  Patient felt to have ANCA vasculitis.  Now currently on dialysis.  Status post dialysis catheter placement.  Nephrology working on setting up outpatient dialysis. ? ?-vasculitis workup per nephro.  Decreasing steroids.  Kidney biopsy done 4/3 with results pending.  Plan for rituximab based on biopsy results ? ?Severe sepsis with acute organ dysfunction ([place]) ?Patient has severe sepsis with tachycardia, tachypnea, leukocytosis and a severe renal failure.  This is most likely due to pneumonia. ?He has received IV fluids.  ? ? ?Acute hypoxemic respiratory failure ([place]) ?--developed overnight, hypoxic requiring 4L O2.  Likely due to fluid overload from bicarb gtt and ESRD ?Plan: ?Improved with dialysis.  Working on weaning off oxygen.  Check ambulatory pulse  ox ? ? ?Multifocal pneumonia ?Mild elevation of procalcitonin level in the setting of acute renal failure.  Completed Rocephin and Zithromax for total 5 day abx ? ?Chronic atrial fibrillation with RVR (HCC) ?--increase Toprol to 150 mg daily.  Previous Cardizem discontinued. ?Started on Eliquis. ? ?Hyperkalemia ?Potassium has normalized.  This is secondary to acute kidney injury as well as severe metabolic acidosis. ? ?Delirium ?Hospital Delirium ?--started to get agitated and combative.  Also noted to not sleep during the night.  Continue as needed Haldol nightly Seroquel.  Somewhat improved. ? ?Acute urinary retention ?--Pt had a foley inserted on presentation, with indication of acute urinary retention. ?--Foley d/c'ed on 4/2, and pt able to avoid ? ?Malnutrition of moderate degree ?Nutrition Status: ?Nutrition Problem: Moderate Malnutrition ?Etiology: chronic illness (CHF, CKD IV) ?Signs/Symptoms: mild fat depletion, moderate muscle depletion, energy intake < or equal to 75% for > or equal to 1 month ?Interventions: MVI, Nepro shake, Liberalize Diet ? ? ? ? ?Chronic HFrEF (heart failure with reduced ejection fraction) ([name]) ?Acute CHF ruled out.  Echocardiogram now notes ejection fraction of 40 to 45% with indeterminate diastolic function.  On Toprol. ? ?Essential hypertension ?--increase Toprol to 150 mg daily  ?--d/c cardizem due to hx of sCHF ? ?Diarrhea ?Patient has been having diarrhea for 1 week, but none since presentation, so C diff and GI path could not be collected. ? ?Hypothyroidism ?Continue Synthroid. ? ?Hyperlipidemia ?Not currently treated. ? ?High anion gap metabolic acidosis ?This is secondary to acute on chronic renal failure.  ?--s/p Na bicarb gtt ? ? ?Acute renal failure superimposed on stage 4 chronic kidney disease ([place]) ?--Cr 15 on presentation, BUN 247. ?Patient had a  chronic kidney disease stage IV, etiology still unclear.  Worsening renal function due to dehydration from  diarrhea. ?Patient also has significant hyperkalemia and metabolic acidosis.   ?--there was also concern for vasculitis, and pt has been receiving IV solumedrol ?--dialysis cath placement and started dialysis on 3/30 ?Plan: ?--vasculitis workup per nephro ?--reduce steroid to prednisone 60 mg daily, per nephro ?--f/u kidney biopsy  ?--Plan for rituximab based on biopsy results ?--need to set up outpatient dialysis ? ? ? ? ? ? ?Body mass index is 26.11 kg/m?Marland Kitchen  ?Nutrition Problem: Moderate Malnutrition ?Etiology: chronic illness (CHF, CKD IV) ?   ? ?Consultants: ?Nephrology ?Vascular surgery ?Pulmonary ? ?Procedures: ?Status post renal biopsy ?Placement of dialysis catheter ?Initiation of hemodialysis ? ?Antimicrobials: ?Completion of 5 days of Rocephin and Zithromax ? ?Code Status: Full code ? ? ?Subjective: Patient doing okay, tired. ? ?Objective: ?Vital signs were reviewed and unremarkable. ?Vitals:  ? 04/28/21 0736 04/28/21 1531  ?BP: 112/62 136/88  ?Pulse: 87 (!) 104  ?Resp: 17 15  ?Temp: 97.9 ?F (36.6 ?C) 98 ?F (36.7 ?C)  ?SpO2: 95% 95%  ? ? ?Intake/Output Summary (Last 24 hours) at 04/28/2021 1557 ?Last data filed at 04/28/2021 1413 ?Gross per 24 hour  ?Intake 740 ml  ?Output 875 ml  ?Net -135 ml  ? ?Filed Weights  ? 04/24/21 1235 04/27/21 0919 04/27/21 1201  ?Weight: 83.6 kg 85.3 kg 84.9 kg  ? ?Body mass index is 26.11 kg/m?. ? ?Exam: ? ?General: Alert and oriented x2, no acute distress ?HEENT: Normocephalic, atraumatic, mucous membranes are slightly dry ?Cardiovascular: Irregular rhythm, rate controlled, 2 out of 6 systolic ejection murmur ?Respiratory: Clear to auscultation bilaterally ?Abdomen: Soft, nontender, nondistended, positive bowel sounds ?Musculoskeletal: No clubbing or cyanosis, trace pitting edema ?Skin: No skin breaks, tears or lesions ?Psychiatry: Appropriate, no evidence of psychoses ?Neurology: No focal deficits ? ?Data Reviewed: ?Pending pathology results ?Elevated white blood cell count in the  setting of steroids ? ?Disposition:  ?Status is: Inpatient ?Remains inpatient appropriate because: Setting up outpatient dialysis ?Renal biopsy results ?  ? ?Anticipated discharge date: 4/7 ? ?Remaining issues to be resolved so that patient can be discharged: Outpatient dialysis set up ? ? ?Family Communication: Wife at the bedside ?DVT Prophylaxis: ?Place TED hose Start: 04/19/21 2226 ?apixaban (ELIQUIS) tablet 5 mg  ? ? ?Author: ?Annita Brod ,MD ?04/28/2021 3:57 PM ? ?To reach On-call, see care teams to locate the attending and reach out via [email or web address]. ?Between 7PM-7AM, please contact night-coverage ?If you still have difficulty reaching the attending provider, please page the [facility] (Director on Call) for [facility] on amion for assistance. ? ?

## 2021-04-28 NOTE — Progress Notes (Signed)
Physical Therapy Treatment ?Patient Details ?Name: Blake West ?MRN: [id number] ?DOB: 1950-11-18 ?Today's Date: 04/28/2021 ? ? ?History of Present Illness 71 year old male with history of biventricular heart failure, hypertension, gout, chronic heart failure reduced ejection fraction, history of glomerulonephritis, CKD stage IV, who presents emergency department from outpatient clinic for chief concerns of renal failure.  Patient has been having coughing and shortness of breath for 2 weeks, nausea, vomiting and diarrhea for 1 week. Pt admitted for severe sepsis with acute organ dysfunction most likely due to pneumonia ? ?  ?PT Comments  ? ? Pt is making good progress towards goals with ability to ambulate further distances this date using RW. Pt still requires close guard during ambulation and can only tolerate short distances due to quick fatigue. Cues given for energy conservation and use of WC for longer distances. Pt reports he feels SOB on RA and requested to have O2 donned post session. Recommend supervision for all OOB to prevent falls. Will continue to progress as able. ? ?SaO2 on room air at rest = 89% ?SaO2 on room air while ambulating = 91% ?SaO2 on 4 liters of O2 while ambulating = 93% ?  ?Recommendations for follow up therapy are one component of a multi-disciplinary discharge planning process, led by the attending physician.  Recommendations may be updated based on patient status, additional functional criteria and insurance authorization. ? ?Follow Up Recommendations ? Home health PT ?  ?  ?Assistance Recommended at Discharge Frequent or constant Supervision/Assistance  ?Patient can return home with the following Help with stairs or ramp for entrance;Assist for transportation;A little help with bathing/dressing/bathroom;A lot of help with walking and/or transfers;Assistance with cooking/housework ?  ?Equipment Recommendations ? Rolling walker (2 wheels);BSC/3in1;Wheelchair (measurements PT);Wheelchair  cushion (measurements PT)  ?  ?Recommendations for Other Services   ? ? ?  ?Precautions / Restrictions Precautions ?Precautions: Fall ?Restrictions ?Weight Bearing Restrictions: No  ?  ? ?Mobility ? Bed Mobility ?Overal bed mobility: Needs Assistance ?Bed Mobility: Supine to Sit ?  ?  ?Supine to sit: Supervision ?Sit to supine: Supervision ?  ?General bed mobility comments: improvement in quality of movement with safe technique. Pt able to sit with upright posture. All mobility performed with RA vs O2 ?  ? ?Transfers ?Overall transfer level: Needs assistance ?Equipment used: Rolling walker (2 wheels) ?Transfers: Sit to/from Stand ?Sit to Stand: Supervision ?  ?  ?  ?  ?  ?General transfer comment: safe technique with upright posture. Cues for hand placement. ?  ? ?Ambulation/Gait ?Ambulation/Gait assistance: Min guard ?Gait Distance (Feet): 80 Feet ?Assistive device: Rolling walker (2 wheels) ?Gait Pattern/deviations: Step-through pattern, Wide base of support ?  ?  ?  ?General Gait Details: ambulated in room followed by ambulation in hallway. RW used with reciprocal gait pattern, however wide base of support and fatigues with increased distance. O2 performed with and without O2. ? ? ?Stairs ?  ?  ?  ?  ?  ? ? ?Wheelchair Mobility ?  ? ?Modified Rankin (Stroke Patients Only) ?  ? ? ?  ?Balance Overall balance assessment: Needs assistance ?Sitting-balance support: Feet supported, Bilateral upper extremity supported ?Sitting balance-Leahy Scale: Fair ?  ?  ?Standing balance support: Bilateral upper extremity supported, During functional activity ?Standing balance-Leahy Scale: Fair ?  ?  ?  ?  ?  ?  ?  ?  ?  ?  ?  ?  ?  ? ?  ?Cognition Arousal/Alertness: Awake/alert ?Behavior During Therapy: Flat affect ?Overall  Cognitive Status: Within Functional Limits for tasks assessed ?  ?  ?  ?  ?  ?  ?  ?  ?  ?  ?  ?  ?  ?  ?  ?  ?General Comments: more alert this session, more talkative, pleasant and appears oriented ?  ?  ? ?   ?Exercises   ? ?  ?General Comments   ?  ?  ? ?Pertinent Vitals/Pain Pain Assessment ?Pain Assessment: No/denies pain  ? ? ?Home Living   ?  ?  ?  ?  ?  ?  ?  ?  ?  ?   ?  ?Prior Function    ?  ?  ?   ? ?PT Goals (current goals can now be found in the care plan section) Acute Rehab PT Goals ?Patient Stated Goal: to get better ?PT Goal Formulation: With patient ?Time For Goal Achievement: 05/05/21 ?Potential to Achieve Goals: Good ?Progress towards PT goals: Progressing toward goals ? ?  ?Frequency ? ? ? Min 2X/week ? ? ? ?  ?PT Plan Current plan remains appropriate  ? ? ?Co-evaluation   ?  ?  ?  ?  ? ?  ?AM-PAC PT "6 Clicks" Mobility   ?Outcome Measure ? Help needed turning from your back to your side while in a flat bed without using bedrails?: A Little ?Help needed moving from lying on your back to sitting on the side of a flat bed without using bedrails?: A Little ?Help needed moving to and from a bed to a chair (including a wheelchair)?: A Little ?Help needed standing up from a chair using your arms (e.g., wheelchair or bedside chair)?: A Little ?Help needed to walk in hospital room?: A Little ?Help needed climbing 3-5 steps with a railing? : A Lot ?6 Click Score: 17 ? ?  ?End of Session Equipment Utilized During Treatment: Gait belt;Oxygen ?Activity Tolerance: Patient limited by fatigue ?Patient left: in bed;with bed alarm set ?Nurse Communication: Mobility status ?PT Visit Diagnosis: Unsteadiness on feet (R26.81);Muscle weakness (generalized) (M62.81);Difficulty in walking, not elsewhere classified (R26.2);Dizziness and giddiness (R42);Pain ?  ? ? ?Time: 4827-0786 ?PT Time Calculation (min) (ACUTE ONLY): 23 min ? ?Charges:  $Gait Training: 23-37 mins          ?          ? ?Greggory Stallion, PT, DPT, GCS ?[phone] ? ? ? ?Westin Knotts ?04/28/2021, 3:09 PM ? ?

## 2021-04-28 NOTE — Progress Notes (Signed)
? ? ? ?PULMONOLOGY ? ? ? ? ? ? ? ? ?Date: 04/28/2021,   ?MRN# [id number] HERMINIO KNISKERN 1950-06-24 ? ? ?  ?AdmissionWeight: 83 kg                 ?CurrentWeight: 84.9 kg (bedscale) ? ?Referring provider: Dr Candiss Norse ? ? ?CHIEF COMPLAINT:  ? ?Anca + vasculitis  ? ? ?HISTORY OF PRESENT ILLNESS  ? ?This is a pleasant 71 year old male with a history of CHF with reduced EF, coronary disease gout, dyslipidemia essential hypertension, hypothyroidism testicular dysfunction, type 2 diabetes, history of glomerulonephritis with CKD stage IV came in from outpatient clinic due to provider concerns for ongoing progressive renal failure.  Patient is essentially a lifelong non-smoker reporting smoking in the 1950s.  He does report mild and subacute and chronic cough with expectoration of whitish phlegm.  Patient reports decreased appetite as well as loose stools for less than 1 week prior to arrival.  In the emergency department patient had accelerated hypertension with tachycardia however normoxia on room air.  His initial creatinine was 15 with a GFR of 3 remainder of blood work was essentially unremarkable besides electrolyte derangements including potassium 6.8 without EKG changes.  He was negative for COVID influenza a and B on arrival.  He had chest imaging done without contrast which showed nodular groundglass airspace opacification of the lung.  There was concern for pneumonia and patient is empirically treated with cefepime and vancomycin and Zithromax on arrival.  Also received Lokelma for elevated potassium.  Spinal this patient was lucid without altered mentation.  CT imaging with lung windows were independently reviewed by me with findings of chronic bronchitic changes as well as punctate multifocal bilateral groundglass nodules these findings are nonspecific and can be seen with atypical infection, viral lower respiratory tract infection as well as inflammatory lung disease. ? ? ?04/23/21- patient resting in bed with family  at bedside during my evaluation. Patient is in no distress with nasal canula off his face normoxic on room air. He ate today with better appetite and was able to get up OOB to bathroom.  Family shares he seems to be feeling better overall. Wife shares the right clavicle oppposite side of trialysis catheter seems to appear different then usual and requests imaging to be done.  I have ordered CXR.  Noted CRP with moderate elevation. Infectious workup in progress , blood work reviewed with improved GFR post HD, mild hyperglycemia and leukocytosis post pulse dose steriods, no overnight events. Appreciate everyone caring for this patient. No changes to therapy from pulm perspective.  ? ?04/27/21- patient examined at bedside with wife present during interview and examination. Patient is sitting up in chair reporting walking around on room air with no dyspnea but does feel fatigued. He is awaiting renal biopsy results but had vigorous urine output today during my evaluation.  ? ?PAST MEDICAL HISTORY  ? ?Past Medical History:  ?Diagnosis Date  ? Abnormal EKG   ? Biventricular failure ([place])   ? CAD (coronary artery disease)   ? Colorectal polyps   ? Gout   ? Hyperlipidemia LDL goal <70   ? Hypertension   ? Hypothyroidism   ? Kidney stone   ? Pneumonia   ? Testicular hypofunction   ? Type 2 diabetes mellitus ([place]) 04/26/2019  ? ? ? ?SURGICAL HISTORY  ? ?Past Surgical History:  ?Procedure Laterality Date  ? CARDIAC CATHETERIZATION    ? DIALYSIS/PERMA CATHETER INSERTION N/A 04/22/2021  ? Procedure: DIALYSIS/PERMA  CATHETER INSERTION;  Surgeon: Algernon Huxley, MD;  Location: [place] [facility];  Service: Cardiovascular;  Laterality: N/A;  ? FOOT SURGERY    ? RIGHT/LEFT HEART CATH AND CORONARY ANGIOGRAPHY N/A 04/30/2019  ? Procedure: RIGHT/LEFT HEART CATH AND CORONARY ANGIOGRAPHY;  Surgeon: Troy Sine, MD;  Location: [facility] [facility];  Service: Cardiovascular;  Laterality: N/A;  ? ? ? ?FAMILY HISTORY  ? ?Family History  ?Problem  Relation Age of Onset  ? Heart attack Mother 42  ? Pulmonary fibrosis Father   ? Congestive Heart Failure Brother   ? Heart attack Brother   ? Heart attack Brother   ? Early death Sister   ? Kidney disease Paternal Grandfather   ? Diabetes Sister   ? ? ? ?SOCIAL HISTORY  ? ?Social History  ? ?Tobacco Use  ? Smoking status: Former  ?  Packs/day: 1.00  ?  Years: 30.00  ?  Pack years: 30.00  ?  Types: Cigarettes  ?  Quit date: 01/24/2002  ?  Years since quitting: 19.2  ? Smokeless tobacco: Never  ?Vaping Use  ? Vaping Use: Never used  ?Substance Use Topics  ? Alcohol use: No  ? Drug use: No  ? ? ? ?MEDICATIONS  ? ? ?Home Medication:  ?  ?Current Medication: ? ?Current Facility-Administered Medications:  ?  (feeding supplement) PROSource Plus liquid 30 mL, 30 mL, Oral, TID BM, Enzo Bi, MD, 30 mL at 04/26/21 2133 ?  0.9 %  sodium chloride infusion, 100 mL, Intravenous, PRN, Lucky Cowboy, Erskine Squibb, MD ?  0.9 %  sodium chloride infusion, 100 mL, Intravenous, PRN, Lucky Cowboy, Erskine Squibb, MD ?  acetaminophen (TYLENOL) tablet 650 mg, 650 mg, Oral, Q6H PRN, 650 mg at 04/28/21 0245 **OR** acetaminophen (TYLENOL) suppository 650 mg, 650 mg, Rectal, Q6H PRN, Algernon Huxley, MD ?  acidophilus (RISAQUAD) capsule 2 capsule, 2 capsule, Oral, TID, Dew, Erskine Squibb, MD, 2 capsule at 04/27/21 2150 ?  albuterol (PROVENTIL) (2.5 MG/3ML) 0.083% nebulizer solution 2.5 mg, 2.5 mg, Nebulization, Q6H PRN, Lucky Cowboy, Erskine Squibb, MD ?  alteplase (CATHFLO ACTIVASE) injection 2 mg, 2 mg, Intracatheter, Once PRN, Algernon Huxley, MD ?  alum & mag hydroxide-simeth (MAALOX/MYLANTA) 200-200-20 MG/5ML suspension 15 mL, 15 mL, Oral, Q6H PRN, Algernon Huxley, MD, 15 mL at 04/23/21 1512 ?  apixaban (ELIQUIS) tablet 5 mg, 5 mg, Oral, BID, Enzo Bi, MD, 5 mg at 04/27/21 2151 ?  budesonide (PULMICORT) nebulizer solution 0.5 mg, 0.5 mg, Nebulization, Q6H PRN, Lucky Cowboy, Erskine Squibb, MD ?  calcium carbonate (TUMS - dosed in mg elemental calcium) chewable tablet 200 mg of elemental calcium, 1 tablet, Oral, TID  PRN, Lucky Cowboy, Erskine Squibb, MD ?  Chlorhexidine Gluconate Cloth 2 % PADS 6 each, 6 each, Topical, Daily, Dew, Erskine Squibb, MD, 6 each at 04/27/21 1612 ?  guaiFENesin (MUCINEX) 12 hr tablet 600 mg, 600 mg, Oral, BID, Dew, Erskine Squibb, MD, 600 mg at 04/27/21 2151 ?  haloperidol lactate (HALDOL) injection 2 mg, 2 mg, Intravenous, Q6H PRN, Enzo Bi, MD, 2 mg at 04/28/21 0245 ?  heparin injection 1,000 Units, 1,000 Units, Dialysis, PRN, Algernon Huxley, MD, 1,000 Units at 04/27/21 1154 ?  insulin aspart (novoLOG) injection 0-5 Units, 0-5 Units, Subcutaneous, QHS, Algernon Huxley, MD, 3 Units at 04/27/21 2151 ?  insulin aspart (novoLOG) injection 0-9 Units, 0-9 Units, Subcutaneous, TID WC, Algernon Huxley, MD, 3 Units at 04/27/21 1803 ?  levothyroxine (SYNTHROID) tablet 25 mcg, 25 mcg,  Oral, [id number], Algernon Huxley, MD, 25 mcg at 04/27/21 0543 ?  lidocaine (PF) (XYLOCAINE) 1 % injection 5 mL, 5 mL, Intradermal, PRN, Lucky Cowboy, Erskine Squibb, MD ?  lidocaine-prilocaine (EMLA) cream 1 application., 1 application., Topical, PRN, Lucky Cowboy, Erskine Squibb, MD ?  MEDLINE mouth rinse, 15 mL, Mouth Rinse, BID, Enzo Bi, MD, 15 mL at 04/27/21 2151 ?  metoprolol succinate (TOPROL-XL) 24 hr tablet 150 mg, 150 mg, Oral, Daily, Enzo Bi, MD, 150 mg at 04/27/21 1426 ?  metoprolol tartrate (LOPRESSOR) injection 5 mg, 5 mg, Intravenous, Q2H PRN, Lucky Cowboy, Erskine Squibb, MD ?  multivitamin (RENA-VIT) tablet 1 tablet, 1 tablet, Oral, QHS, Enzo Bi, MD, 1 tablet at 04/27/21 2150 ?  ondansetron (ZOFRAN) tablet 4 mg, 4 mg, Oral, Q6H PRN **OR** ondansetron (ZOFRAN) injection 4 mg, 4 mg, Intravenous, Q6H PRN, Algernon Huxley, MD, 4 mg at 04/22/21 1036 ?  ondansetron (ZOFRAN) injection 4 mg, 4 mg, Intravenous, Q6H PRN, Algernon Huxley, MD, 4 mg at 04/23/21 0546 ?  pentafluoroprop-tetrafluoroeth (GEBAUERS) aerosol 1 application., 1 application., Topical, PRN, Lucky Cowboy, Erskine Squibb, MD ?  predniSONE (DELTASONE) tablet 60 mg, 60 mg, Oral, Q breakfast, Breeze, Shantelle, NP ?  prochlorperazine (COMPAZINE) injection 10 mg,  10 mg, Intravenous, Q6H PRN, Enzo Bi, MD, 10 mg at 04/23/21 1919 ?  QUEtiapine (SEROQUEL) tablet 100 mg, 100 mg, Oral, QHS, Enzo Bi, MD, 100 mg at 04/27/21 2150 ? ? ? ?ALLERGIES  ? ?Codeine ? ? ? ?

## 2021-04-28 NOTE — Progress Notes (Signed)
?Blake West  ?ROUNDING NOTE  ? ?Subjective:  ? 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  Patient presents to the emergency department with complaints of nausea, vomiting, generalized decline.  Patient has been admitted for Hyperkalemia [E87.5] ?Atrial fibrillation with RVR ([place]) [I48.91] ?Severe sepsis with acute organ dysfunction ([place]) [A41.9, R65.20] ?Acute renal failure, unspecified acute renal failure type ([place]) [N17.9] ? ? ?Update: ? ?Patient seen resting in bed, alert ?Completed breakfast tray at bedside ?Wife at bedside patient oriented to self and easily redirected to place.  Wife reports mild confusion overnight however not to extent of previous nights postdialysis days. ?Appetite is improving. ? ?Objective:  ?Vital signs in last 24 hours:  ?Temp:  [97.6 ?F (36.4 ?C)-98.6 ?F (37 ?C)] 97.9 ?F (36.6 ?C) (04/05 9604) ?Pulse Rate:  [45-97] 87 (04/05 0736) ?Resp:  [13-20] 17 (04/05 0736) ?BP: (104-143)/(62-87) 112/62 (04/05 0736) ?SpO2:  [91 %-96 %] 95 % (04/05 0736) ?Weight:  [84.9 kg] 84.9 kg (04/04 1201) ? ?Weight change:  ?Filed Weights  ? 04/24/21 1235 04/27/21 0919 04/27/21 1201  ?Weight: 83.6 kg 85.3 kg 84.9 kg  ? ? ?Intake/Output: ?I/O last 3 completed shifts: ?In: 700 [P.O.:700] ?Out: 1281 [Urine:1275; Other:6] ?  ?Intake/Output this shift: ? Total I/O ?In: 240 [P.O.:240] ?Out: -  ? ?Physical Exam: ?General: NAD  ?Head: Normocephalic, atraumatic.  Dry oral mucosal membranes  ?Eyes: Anicteric  ?Lungs:  Clear to auscultation, normal effort  ?Heart: Irregular rate and rhythm  ?Abdomen:  Soft, nontender, nondistended  ?Extremities: No peripheral edema.  ?Neurologic: Alert, oriented to self only, moving all four extremities, tremors  ?Skin: No lesions  ?Access: Rt permcath placed on 04/22/21 by Dr  Lucky Cowboy  ? ? ?Basic Metabolic Panel: ?Recent Labs  ?Lab 04/24/21 ?0624 04/25/21 ?5409 04/26/21 ?8119 04/27/21 ?1478 04/28/21 ?0631  ?NA 139 135 132* 132* 135  ?K 3.8 3.7 3.5 3.4* 3.4*  ?CL 96* 95* 93* 93* 99  ?CO2 26 25 21* 19* 22  ?GLUCOSE 174* 159* 176* 193* 155*  ?BUN 126* 89* 121* 149* 123*  ?CREATININE 8.04* 6.16* 7.54* 7.97* 5.91*  ?CALCIUM 8.5* 8.5* 8.2* 8.0* 7.8*  ?MG 2.5* 2.4 2.5* 2.7* 2.1  ? ? ? ?Liver Function Tests: ?No results for input(s): AST, ALT, ALKPHOS, BILITOT, PROT, ALBUMIN in the last 168 hours. ? ?No results for input(s): LIPASE, AMYLASE in the last 168 hours. ? ?No results for input(s): AMMONIA in the last 168 hours. ? ?CBC: ?Recent Labs  ?Lab 04/24/21 ?0624 04/25/21 ?2956 04/26/21 ?2130 04/27/21 ?8657 04/28/21 ?0631  ?WBC 13.0* 12.8* 10.8* 15.0* 18.9*  ?HGB 10.4* 9.5* 9.0* 9.6* 8.8*  ?HCT 31.1* 28.9* 27.1* 28.5* 26.7*  ?MCV 86.9 86.0 86.6 85.6 86.7  ?PLT 176 163 143* 146* 160  ? ? ? ?Cardiac Enzymes: ?No results for input(s): CKTOTAL, CKMB, CKMBINDEX, TROPONINI in the last 168 hours. ? ?BNP: ?Invalid input(s): POCBNP ? ?CBG: ?Recent Labs  ?Lab 04/27/21 ?1301 04/27/21 ?1729 04/27/21 ?2037 04/27/21 ?2128 04/28/21 ?0739  ?GLUCAP 178* 222* 275* 256* 148*  ? ? ? ?Microbiology: ?Results for orders placed or performed during the hospital encounter of 04/19/21  ?Resp Panel by RT-PCR (Flu A&B, Covid) Nasopharyngeal Swab     Status: None  ? Collection Time: 04/19/21  9:14 PM  ? Specimen: Nasopharyngeal Swab; Nasopharyngeal(NP) swabs in vial transport medium  ?Result Value Ref Range Status  ?  SARS Coronavirus 2 by RT PCR NEGATIVE NEGATIVE Final  ?  Comment: (NOTE) ?SARS-CoV-2 target nucleic acids are NOT DETECTED. ? ?The SARS-CoV-2 RNA is generally detectable in upper respiratory ?specimens during the acute phase of infection. The lowest ?concentration of SARS-CoV-2 viral copies this assay can detect is ?138 copies/mL. A negative result does not preclude SARS-Cov-2 ?infection and should not be used as the  sole basis for treatment or ?other patient management decisions. A negative result may occur with  ?improper specimen collection/handling, submission of specimen other ?than nasopharyngeal swab, presence of viral mutation(s) within the ?areas targeted by this assay, and inadequate number of viral ?copies(<138 copies/mL). A negative result must be combined with ?clinical observations, patient history, and epidemiological ?information. The expected result is Negative. ? ?Fact Sheet for Patients:  ?EntrepreneurPulse.com.au ? ?Fact Sheet for Healthcare Providers:  ?IncredibleEmployment.be ? ?This test is no t yet approved or cleared by the [place] FDA and  ?has been authorized for detection and/or diagnosis of SARS-CoV-2 by ?FDA under an Emergency Use Authorization (EUA). This EUA will remain  ?in effect (meaning this test can be used) for the duration of the ?COVID-19 declaration under Section 564(b)(1) of the Act, 21 ?U.S.C.section 360bbb-3(b)(1), unless the authorization is terminated  ?or revoked sooner.  ? ? ?  ? Influenza A by PCR NEGATIVE NEGATIVE Final  ? Influenza B by PCR NEGATIVE NEGATIVE Final  ?  Comment: (NOTE) ?The Xpert Xpress SARS-CoV-2/FLU/RSV plus assay is intended as an aid ?in the diagnosis of influenza from Nasopharyngeal swab specimens and ?should not be used as a sole basis for treatment. Nasal washings and ?aspirates are unacceptable for Xpert Xpress SARS-CoV-2/FLU/RSV ?testing. ? ?Fact Sheet for Patients: ?EntrepreneurPulse.com.au ? ?Fact Sheet for Healthcare Providers: ?IncredibleEmployment.be ? ?This test is not yet approved or cleared by the [place] FDA and ?has been authorized for detection and/or diagnosis of SARS-CoV-2 by ?FDA under an Emergency Use Authorization (EUA). This EUA will remain ?in effect (meaning this test can be used) for the duration of the ?COVID-19 declaration under Section 564(b)(1) of the  Act, 21 U.S.C. ?section 360bbb-3(b)(1), unless the authorization is terminated or ?revoked. ? ?Performed at [facility], [place], ?[place] [address] ?  ?Culture, blood (routine x 2)     Status: None  ? Collection Time: 04/19/21  9:45 PM  ? Specimen: BLOOD  ?Result Value Ref Range Status  ? Specimen Description BLOOD RIGHT ANTECUBITAL  Final  ? Special Requests   Final  ?  BOTTLES DRAWN AEROBIC AND ANAEROBIC Blood Culture adequate volume  ? Culture   Final  ?  NO GROWTH 5 DAYS ?Performed at [facility], [address], [place], [place] [address] ?  ? Report Status 04/24/2021 FINAL  Final  ?Culture, blood (routine x 2)     Status: None  ? Collection Time: 04/19/21 10:32 PM  ? Specimen: BLOOD  ?Result Value Ref Range Status  ? Specimen Description BLOOD LEFT ASSIST CONTROL  Final  ? Special Requests   Final  ?  BOTTLES DRAWN AEROBIC AND ANAEROBIC Blood Culture adequate volume  ? Culture   Final  ?  NO GROWTH 5 DAYS ?Performed at [facility], [address], [place], [place] [address] ?  ? Report Status 04/24/2021 FINAL  Final  ?MRSA Next Gen by PCR, Nasal     Status: None  ? Collection Time: 04/20/21  2:21 AM  ? Specimen: Nasal Mucosa; Nasal Swab  ?Result Value Ref Range  Status  ? MRSA by PCR Next Gen NOT DETECTED NOT DETECTED Final  ?  Comment: (NOTE) ?The GeneXpert MRSA Assay (FDA approved for NASAL specimens only), ?is one component of a comprehensive MRSA colonization surveillance ?program. It is not intended to diagnose MRSA infection nor to guide ?or monitor treatment for MRSA infections. ?Test performance is not FDA approved in patients less than 2 years ?old. ?Performed at [facility], [place], ?[place] [address] ?  ?Urine Culture     Status: None  ? Collection Time: 04/20/21  5:45 AM  ? Specimen: Urine, Clean Catch  ?Result Value Ref Range Status  ? Specimen Description   Final  ?  URINE, CLEAN CATCH ?Performed at [facility], [address], [place], [place] [address] ?  ? Special Requests   Final  ?  NONE ?Performed at [facility], [address], [place],  [address] ?  ? Culture   Final  ?  NO GROWTH

## 2021-04-28 NOTE — Progress Notes (Signed)
Nutrition Follow-up ? ?DOCUMENTATION CODES:  ? ?Non-severe (moderate) malnutrition in context of chronic illness ? ?INTERVENTION:  ? ?Double protein with meals ? ?Rena-vit po daily  ? ?NUTRITION DIAGNOSIS:  ? ?Moderate Malnutrition related to chronic illness (CHF, CKD IV) as evidenced by mild fat depletion, moderate muscle depletion, energy intake < or equal to 75% for > or equal to 1 month. ? ?GOAL:  ? ?Patient will meet greater than or equal to 90% of their needs ?-progressing  ? ?MONITOR:  ? ?PO intake, Supplement acceptance, Diet advancement, Labs, Weight trends, I & O's ? ?ASSESSMENT:  ? ?71 y/o male with h/o HTN, HLD, hypothyroidism, CHF, OSA, DM, gout, CKD IV, CAD and cardiomyopathy who is admitted with PNA, AKI and progression to ESRD r/t possible untreated vasculitis leading to rapidly progressive glomerulonephritis. Renal biopsy pending ? ?Pt s/p new HD 3/30 ?Pt s/p renal biopsy 4/3; results pending ? ?Pt with improved appetite and oral intake; pt eating 75-100% of meals in hospital. Nepro was discontinued as pt reports he did not like this. Pt has also been refusing ProSource Plus; RD will discontinue. RD will add double protein to meal trays. Recommend continue rena-vit after discharge. Per chart, pt appears weight stable since admission.   ? ?Medications reviewed and include: risaquad, insulin, synthroid, rena-vit, prednisone  ? ?Labs reviewed: K 3.4(L), BUN 123(H), creat 5.91(H), P 13.2(H), Mg 2.1 wnl ?Wbc- 18.9(H), Hgb 8.8(L), Hct 26.7(L) ?Cbgs- 244, 148, 256, 275, 222, 178 x 24 hrs ? ?Diet Order:   ?Diet Order   ? ?       ?  Diet regular Room service appropriate? Yes; Fluid consistency: Thin  Diet effective now       ?  ? ?  ?  ? ?  ? ?EDUCATION NEEDS:  ? ?Education needs have been addressed ? ?Skin:  Skin Assessment: Reviewed RN Assessment ? ?Last BM:  4/4- type 6 ? ?Height:  ? ?Ht Readings from Last 1 Encounters:  ?04/20/21 5\' 11"  (1.803 m)  ? ? ?Weight:  ? ?Wt Readings from Last 1 Encounters:   ?04/27/21 84.9 kg  ? ?BMI:  Body mass index is 26.11 kg/m?. ? ?Estimated Nutritional Needs:  ? ?Kcal:  2100-2400kcal/day ? ?Protein:  105-120g/day ? ?Fluid:  UOP +1L ? ?Koleen Distance MS, RD, LDN ?Please refer to AMION for RD and/or RD on-call/weekend/after hours pager ? ?

## 2021-04-29 DIAGNOSIS — I4891 Unspecified atrial fibrillation: Secondary | ICD-10-CM | POA: Diagnosis not present

## 2021-04-29 DIAGNOSIS — N170 Acute kidney failure with tubular necrosis: Secondary | ICD-10-CM | POA: Diagnosis not present

## 2021-04-29 DIAGNOSIS — I7782 Antineutrophilic cytoplasmic antibody (ANCA) vasculitis: Secondary | ICD-10-CM | POA: Diagnosis not present

## 2021-04-29 DIAGNOSIS — I5022 Chronic systolic (congestive) heart failure: Secondary | ICD-10-CM | POA: Diagnosis not present

## 2021-04-29 DIAGNOSIS — D72829 Elevated white blood cell count, unspecified: Secondary | ICD-10-CM

## 2021-04-29 HISTORY — DX: Elevated white blood cell count, unspecified: D72.829

## 2021-04-29 LAB — BASIC METABOLIC PANEL
Anion gap: 15 (ref 5–15)
BUN: 150 mg/dL — ABNORMAL HIGH (ref 8–23)
CO2: 21 mmol/L — ABNORMAL LOW (ref 22–32)
Calcium: 7.9 mg/dL — ABNORMAL LOW (ref 8.9–10.3)
Chloride: 98 mmol/L (ref 98–111)
Creatinine, Ser: 6.77 mg/dL — ABNORMAL HIGH (ref 0.61–1.24)
GFR, Estimated: 8 mL/min — ABNORMAL LOW (ref >60)
Glucose, Bld: 188 mg/dL — ABNORMAL HIGH (ref 70–99)
Potassium: 3.5 mmol/L (ref 3.5–5.1)
Sodium: 134 mmol/L — ABNORMAL LOW (ref 135–145)

## 2021-04-29 LAB — CBC
HCT: 26.4 % — ABNORMAL LOW (ref 39.0–52.0)
Hemoglobin: 8.8 g/dL — ABNORMAL LOW (ref 13.0–17.0)
MCH: 28.9 pg (ref 26.0–34.0)
MCHC: 33.3 g/dL (ref 30.0–36.0)
MCV: 86.6 fL (ref 80.0–100.0)
Platelets: 173 10*3/uL (ref 150–400)
RBC: 3.05 MIL/uL — ABNORMAL LOW (ref 4.22–5.81)
RDW: 13.3 % (ref 11.5–15.5)
WBC: 20.6 10*3/uL — ABNORMAL HIGH (ref 4.0–10.5)
nRBC: 0 % (ref 0.0–0.2)

## 2021-04-29 LAB — MAGNESIUM: Magnesium: 2.2 mg/dL (ref 1.7–2.4)

## 2021-04-29 LAB — GLUCOSE, CAPILLARY
Glucose-Capillary: 168 mg/dL — ABNORMAL HIGH (ref 70–99)
Glucose-Capillary: 226 mg/dL — ABNORMAL HIGH (ref 70–99)
Glucose-Capillary: 281 mg/dL — ABNORMAL HIGH (ref 70–99)

## 2021-04-29 MED ORDER — DIPHENHYDRAMINE HCL 25 MG PO CAPS
50.0000 mg | ORAL_CAPSULE | Freq: Once | ORAL | Status: AC
  Administered 2021-04-30: 50 mg via ORAL
  Filled 2021-04-29: qty 2

## 2021-04-29 MED ORDER — EPINEPHRINE PF 1 MG/ML IJ SOLN
0.3000 mg | INTRAMUSCULAR | Status: DC | PRN
  Filled 2021-04-29: qty 1

## 2021-04-29 MED ORDER — SODIUM CHLORIDE 0.9 % IV BOLUS: 1000.0000 mL | Freq: Once | INTRAVENOUS | Status: DC | PRN

## 2021-04-29 MED ORDER — ACETAMINOPHEN 325 MG PO TABS
ORAL_TABLET | ORAL | Status: AC
  Filled 2021-04-29: qty 2

## 2021-04-29 MED ORDER — DIPHENHYDRAMINE HCL 50 MG/ML IJ SOLN: 50.0000 mg | Freq: Once | INTRAMUSCULAR | Status: DC | PRN

## 2021-04-29 MED ORDER — SODIUM CHLORIDE 0.9 % IV SOLN
1000.0000 mg | Freq: Once | INTRAVENOUS | Status: AC
  Administered 2021-04-30: 1000 mg via INTRAVENOUS
  Filled 2021-04-29: qty 100

## 2021-04-29 MED ORDER — SULFAMETHOXAZOLE-TRIMETHOPRIM 800-160 MG PO TABS
1.0000 | ORAL_TABLET | ORAL | Status: DC
  Administered 2021-04-30: 1 via ORAL
  Filled 2021-04-29: qty 1

## 2021-04-29 MED ORDER — ACETAMINOPHEN 325 MG PO TABS
650.0000 mg | ORAL_TABLET | Freq: Once | ORAL | Status: AC
  Administered 2021-04-30: 650 mg via ORAL
  Filled 2021-04-29: qty 2

## 2021-04-29 MED ORDER — FAMOTIDINE IN NACL 20-0.9 MG/50ML-% IV SOLN
20.0000 mg | Freq: Once | INTRAVENOUS | Status: DC | PRN
  Filled 2021-04-29: qty 50

## 2021-04-29 MED ORDER — METHYLPREDNISOLONE SODIUM SUCC 125 MG IJ SOLR
125.0000 mg | Freq: Once | INTRAMUSCULAR | Status: DC | PRN
  Filled 2021-04-29: qty 2

## 2021-04-29 MED ORDER — ALBUTEROL SULFATE (2.5 MG/3ML) 0.083% IN NEBU
2.5000 mg | INHALATION_SOLUTION | Freq: Once | RESPIRATORY_TRACT | Status: DC | PRN
  Filled 2021-04-29: qty 3

## 2021-04-29 NOTE — Progress Notes (Signed)
[facility] Progress Note ? ?Patient: Blake West    EVO:[id number]  DOA: 04/19/2021    ?Date of Service: the patient was seen and examined on 04/29/2021 ? ?Brief hospital course: ?71 year old male with past medical history of chronic systolic heart failure, stage IV chronic kidney disease, history of glomerulonephritis who presented to the emergency room on 3/27 after being sent over from his nephrologist with lab work noting hyperkalemia and worsening renal function.  Patient found to have a creatinine of 15.  Chest x-ray also noted scattered nodular airspace disease concerning for pneumonia.  Patient admitted to the hospital service and started on antibiotics.  Nephrology consulted and work-up underway for vasculitis.  Renal function continued to decline and patient had dialysis catheter placed and started dialysis on 3/30.  Hospital course complicated by urinary retention which has since resolved and treatment of multifocal pneumonia. ? ? ? ?Assessment and Plan: ?Assessment and Plan: ?ESRD (end stage renal disease) on dialysis [facility]) ?Prior to hospital presentation was at stage IV chronic kidney disease, and did have previous history of glomerulonephritis.  Patient felt to have ANCA vasculitis.  Now currently on dialysis.  Status post dialysis catheter placement.  Nephrology working on setting up outpatient dialysis. ? ?-vasculitis workup per nephro.  Decreasing steroids.  Kidney biopsy done 4/3 with results pending.  Plan for rituximab based on biopsy results ? ?Severe sepsis with acute organ dysfunction ([place]) ?Patient met criteria for severe sepsis on admission with tachycardia, tachypnea, leukocytosis and a severe renal failure and pneumonia source.  Status post IV antibiotics and fluids.  Sepsis itself has resolved. ? ?Acute hypoxemic respiratory failure ([place]) ?Secondary to impart volume overload as well as pneumonia.  Patient still remains hypoxic.  Able to wean down to 3.5 L, although with ambulation,  oxygen saturations drop.  Hopefully can get off more fluid during dialysis today and continue to wean down oxygen.  He will likely need it upon discharge. ? ?Multifocal pneumonia ?Mild elevation of procalcitonin level in the setting of acute renal failure.  Completed Rocephin and Zithromax for total 5 day abx ? ?Chronic atrial fibrillation with RVR ([facility]) ?--increase Toprol to 150 mg daily.  Previous Cardizem discontinued. ?Started on Eliquis. ? ?Leukocytosis ?Secondary to steroids ? ?Hyperkalemia ?Potassium has normalized.  This is secondary to acute kidney injury as well as severe metabolic acidosis. ? ?Delirium ?Hospital Delirium ?--started to get agitated and combative.  Also noted to not sleep during the night.  Continue as needed Haldol nightly Seroquel.  Somewhat improved. ? ?Acute urinary retention ?--Pt had a foley inserted on presentation, with indication of acute urinary retention. ?--Foley d/c'ed on 4/2, and pt able to avoid ? ?Malnutrition of moderate degree ?Nutrition Status: ?Nutrition Problem: Moderate Malnutrition ?Etiology: chronic illness (CHF, CKD IV) ?Signs/Symptoms: mild fat depletion, moderate muscle depletion, energy intake < or equal to 75% for > or equal to 1 month ?Interventions: MVI, Nepro shake, Liberalize Diet ? ? ? ? ?Chronic HFrEF (heart failure with reduced ejection fraction) ([place]) ?Acute CHF ruled out.  Echocardiogram now notes ejection fraction of 40 to 45% with indeterminate diastolic function.  On Toprol. ? ?Essential hypertension ?--increase Toprol to 150 mg daily  ?--d/c cardizem due to hx of sCHF ? ?Diarrhea ?Patient has been having diarrhea for 1 week, but none since presentation, so C diff and GI path could not be collected. ? ?Hypothyroidism ?Continue Synthroid. ? ?Hyperlipidemia ?Not currently treated. ? ?High anion gap metabolic acidosis ?This is secondary to acute on chronic renal  failure.  ?--s/p Na bicarb gtt ? ? ?Acute renal failure superimposed on stage 4 chronic  kidney disease ([place]) ?--Cr 15 on presentation, BUN 247. ?Patient had a chronic kidney disease stage IV, etiology still unclear.  Worsening renal function due to dehydration from diarrhea. ?Patient also has significant hyperkalemia and metabolic acidosis.   ?--there was also concern for vasculitis, and pt has been receiving IV solumedrol ?--dialysis cath placement and started dialysis on 3/30 ?Plan: ?--vasculitis workup per nephro ?--reduce steroid to prednisone 60 mg daily, per nephro ?--f/u kidney biopsy  ?--Plan for rituximab based on biopsy results ?--need to set up outpatient dialysis ? ? ? ? ? ? ?Body mass index is 26.11 kg/m?Marland Kitchen  ?Nutrition Problem: Moderate Malnutrition ?Etiology: chronic illness (CHF, CKD IV) ?   ? ?Consultants: ?Nephrology ?Vascular surgery ?Pulmonary ? ?Procedures: ?Status post renal biopsy ?Placement of dialysis catheter ?Initiation of hemodialysis ? ?Antimicrobials: ?Completion of 5 days of Rocephin and Zithromax ? ?Code Status: Full code ? ? ?Subjective: Patient doing okay.  Complains of some congestion secondary to the oxygen drying his nose. ? ?Objective: ?Vital signs were reviewed and unremarkable. ?Vitals:  ? 04/29/21 1315 04/29/21 1330  ?BP: (!) 149/135 139/79  ?Pulse: 98 (!) 106  ?Resp: 14   ?Temp:    ?SpO2: 97% 96%  ? ? ?Intake/Output Summary (Last 24 hours) at 04/29/2021 1401 ?Last data filed at 04/29/2021 0800 ?Gross per 24 hour  ?Intake 480 ml  ?Output 1100 ml  ?Net -620 ml  ? ? ?Filed Weights  ? 04/24/21 1235 04/27/21 0919 04/27/21 1201  ?Weight: 83.6 kg 85.3 kg 84.9 kg  ? ?Body mass index is 26.11 kg/m?. ? ?Exam: ? ?General: Alert and oriented x2, no acute distress ?HEENT: Normocephalic, atraumatic, mucous membranes are slightly dry ?Cardiovascular: Irregular rhythm, rate controlled, 2 out of 6 systolic ejection murmur ?Respiratory: Clear to auscultation bilaterally ?Abdomen: Soft, nontender, nondistended, positive bowel sounds ?Musculoskeletal: No clubbing or cyanosis, trace  pitting edema ?Skin: No skin breaks, tears or lesions ?Psychiatry: Appropriate, no evidence of psychoses ?Neurology: No focal deficits ? ?Data Reviewed: ?Pending pathology results ?Elevated white blood cell count in the setting of steroids ? ?Disposition:  ?Status is: Inpatient ?Remains inpatient appropriate because: Setting up outpatient dialysis ?Renal biopsy results ?  ? ?Anticipated discharge date: 4/7 ? ?Remaining issues to be resolved so that patient can be discharged: Outpatient dialysis set up ? ? ?Family Communication: Wife at the bedside ?DVT Prophylaxis: ?Place TED hose Start: 04/19/21 2226 ?apixaban (ELIQUIS) tablet 5 mg  ? ? ?Author: ?Annita Brod ,MD ?04/29/2021 2:01 PM ? ?To reach On-call, see care teams to locate the attending and reach out via [email or web address]. ?Between 7PM-7AM, please contact night-coverage ?If you still have difficulty reaching the attending provider, please page the [facility] (Director on Call) for [facility] on amion for assistance. ? ?

## 2021-04-29 NOTE — TOC Progression Note (Signed)
Transition of Care (TOC) - Progression Note  ? ? ?Patient Details  ?Name: Blake West ?MRN: [id number] ?Date of Birth: Jan 19, 1951 ? ?Transition of Care (TOC) CM/SW Contact  ?Beverly Sessions, RN ?Phone Number: ?04/29/2021, 8:56 AM ? ?Clinical Narrative:    ? ? ?Reached out to Elvera Bicker HD coordinator to determine if there is an update on outpatient chair time for HD ?Expected Discharge Plan: [place] ?Barriers to Discharge: Continued Medical Work up ? ?Expected Discharge Plan and Services ?Expected Discharge Plan: [place] ?  ?  ?Post Acute Care Choice: Home Health, Durable Medical Equipment ?Living arrangements for the past 2 months: [place] ?                ?  ?  ?  ?  ?  ?  ?  ?  ?  ?  ? ? ?Social Determinants of Health (SDOH) Interventions ?  ? ?Readmission Risk Interventions ? ?  04/22/2021  ? 12:24 PM  ?Readmission Risk Prevention Plan  ?Transportation Screening Complete  ?PCP or Specialist Appt within 3-5 Days Complete  ?Social Work Consult for [place] Planning/Counseling Complete  ?Palliative Care Screening Not Applicable  ?Medication Review Press photographer) Complete  ? ? ?

## 2021-04-29 NOTE — Progress Notes (Signed)
Inpatient Diabetes Program Recommendations ? ?AACE/ADA: New Consensus Statement on Inpatient Glycemic Control (2015) ? ?Target Ranges:  Prepandial:   less than 140 mg/dL ?     Peak postprandial:   less than 180 mg/dL (1-2 hours) ?     Critically ill patients:  140 - 180 mg/dL  ? ?Lab Results  ?Component Value Date  ? GLUCAP 168 (H) 04/29/2021  ? HGBA1C 5.9 (H) 04/19/2021  ? ? ?Review of Glycemic Control ? Latest Reference Range & Units 04/28/21 07:39 04/28/21 11:48 04/28/21 17:05 04/28/21 21:07 04/29/21 07:54  ?Glucose-Capillary 70 - 99 mg/dL 148 (H) 244 (H) 248 (H) 192 (H) 168 (H)  ? ?Current orders for Inpatient glycemic control:  ?Novolog 0-9 units tid + hs ? ?PO prednisone 60 mg Daily ? ?Inpatient Diabetes Program Recommendations:   ? ?Glucose trends increase after steroid dose and meal intake ? ?-   Consider adding Novolog 2 units tid meal coverage if eating >50% of meals ? ?Thanks, ? ?Tama Headings RN, MSN, BC-ADM ?Inpatient Diabetes Coordinator ?Team Pager [phone] (8a-5p) ? ?

## 2021-04-29 NOTE — Assessment & Plan Note (Signed)
Secondary to steroids ?

## 2021-04-29 NOTE — Progress Notes (Addendum)
?[facility]  ?ROUNDING NOTE  ? ?Subjective:  ? 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  Patient presents to the emergency department with complaints of nausea, vomiting, generalized decline.  Patient has been admitted for Hyperkalemia [E87.5] ?Atrial fibrillation with RVR ([place]) [I48.91] ?Severe sepsis with acute organ dysfunction ([place]) [A41.9, R65.20] ?Acute renal failure, unspecified acute renal failure type ([place]) [N17.9] ? ? ?Update: ? ?Patient seen and evaluated during dialysis ?Alert with mild confusion, easily redirected ?  ?HEMODIALYSIS FLOWSHEET: ? ?Blood Flow Rate (mL/min): 300 mL/min ?Arterial Pressure (mmHg): -130 mmHg ?Venous Pressure (mmHg): 110 mmHg ?Transmembrane Pressure (mmHg): 50 mmHg ?Ultrafiltration Rate (mL/min): 170 mL/min ?Dialysate Flow Rate (mL/min): 500 ml/min ?Conductivity: Machine : 13.8 ?Conductivity: Machine : 13.8 ?Dialysis Fluid Bolus: Normal Saline ?Bolus Amount (mL): 250 mL ? ?Tolerating treatment fair seated in chair, complains of back pain ? ?Objective:  ?Vital signs in last 24 hours:  ?Temp:  [97.9 ?F (36.6 ?C)-98.4 ?F (36.9 ?C)] 98.1 ?F (36.7 ?C) (04/06 1054) ?Pulse Rate:  [44-106] 106 (04/06 1330) ?Resp:  [10-20] 14 (04/06 1315) ?BP: (106-149)/(73-135) 139/79 (04/06 1330) ?SpO2:  [94 %-97 %] 96 % (04/06 1330) ? ?Weight change:  ?Filed Weights  ? 04/24/21 1235 04/27/21 0919 04/27/21 1201  ?Weight: 83.6 kg 85.3 kg 84.9 kg  ? ? ?Intake/Output: ?I/O last 3 completed shifts: ?In: 740 [P.O.:740] ?Out: 1350 [[id number]:[id number]] ?  ?Intake/Output this shift: ? Total I/O ?In: 240 [P.O.:240] ?Out: 300 [Urine:300] ? ?Physical Exam: ?General: NAD  ?Head: Normocephalic, atraumatic.  Dry oral mucosal membranes  ?Eyes: Anicteric  ?Lungs:  Clear to auscultation, normal effort  ?Heart:  Irregular rate and rhythm  ?Abdomen:  Soft, nontender, nondistended  ?Extremities: No peripheral edema.  ?Neurologic: Alert, oriented to self only, moving all four extremities, tremors  ?Skin: No lesions  ?Access: Rt permcath placed on 04/22/21 by Dr Lucky Cowboy  ? ? ?Basic Metabolic Panel: ?Recent Labs  ?Lab 04/25/21 ?9323 04/26/21 ?5573 04/27/21 ?2202 04/28/21 ?5427 04/29/21 ?0623  ?NA 135 132* 132* 135 134*  ?K 3.7 3.5 3.4* 3.4* 3.5  ?CL 95* 93* 93* 99 98  ?CO2 25 21* 19* 22 21*  ?GLUCOSE 159* 176* 193* 155* 188*  ?BUN 89* 121* 149* 123* 150*  ?CREATININE 6.16* 7.54* 7.97* 5.91* 6.77*  ?CALCIUM 8.5* 8.2* 8.0* 7.8* 7.9*  ?MG 2.4 2.5* 2.7* 2.1 2.2  ? ? ? ?Liver Function Tests: ?No results for input(s): AST, ALT, ALKPHOS, BILITOT, PROT, ALBUMIN in the last 168 hours. ? ?No results for input(s): LIPASE, AMYLASE in the last 168 hours. ? ?No results for input(s): AMMONIA in the last 168 hours. ? ?CBC: ?Recent Labs  ?Lab 04/25/21 ?7628 04/26/21 ?3151 04/27/21 ?7616 04/28/21 ?0737 04/29/21 ?1062  ?WBC 12.8* 10.8* 15.0* 18.9* 20.6*  ?HGB 9.5* 9.0* 9.6* 8.8* 8.8*  ?HCT 28.9* 27.1* 28.5* 26.7* 26.4*  ?MCV 86.0 86.6 85.6 86.7 86.6  ?PLT 163 143* 146* 160 173  ? ? ? ?Cardiac Enzymes: ?No results for input(s): CKTOTAL, CKMB, CKMBINDEX, TROPONINI in the last 168 hours. ? ?BNP: ?Invalid input(s): POCBNP ? ?CBG: ?Recent Labs  ?Lab 04/28/21 ?0739 04/28/21 ?1148 04/28/21 ?1705 04/28/21 ?2107 04/29/21 ?0754  ?GLUCAP 148* 244* 248* 192* 168*  ? ? ? ?Microbiology: ?Results for orders placed or performed during the hospital encounter of 04/19/21  ?Resp Panel by RT-PCR (Flu A&B, Covid) Nasopharyngeal Swab  Status: None  ? Collection Time: 04/19/21  9:14 PM  ? Specimen: Nasopharyngeal Swab; Nasopharyngeal(NP) swabs in vial transport medium  ?Result Value Ref Range Status  ? SARS Coronavirus 2 by RT PCR NEGATIVE NEGATIVE Final  ?  Comment: (NOTE) ?SARS-CoV-2 target nucleic acids are NOT DETECTED. ? ?The SARS-CoV-2 RNA is generally detectable  in upper respiratory ?specimens during the acute phase of infection. The lowest ?concentration of SARS-CoV-2 viral copies this assay can detect is ?138 copies/mL. A negative result does not preclude SARS-Cov-2 ?infection and should not be used as the sole basis for treatment or ?other patient management decisions. A negative result may occur with  ?improper specimen collection/handling, submission of specimen other ?than nasopharyngeal swab, presence of viral mutation(s) within the ?areas targeted by this assay, and inadequate number of viral ?copies(<138 copies/mL). A negative result must be combined with ?clinical observations, patient history, and epidemiological ?information. The expected result is Negative. ? ?Fact Sheet for Patients:  ?EntrepreneurPulse.com.au ? ?Fact Sheet for Healthcare Providers:  ?IncredibleEmployment.be ? ?This test is no t yet approved or cleared by the [place] FDA and  ?has been authorized for detection and/or diagnosis of SARS-CoV-2 by ?FDA under an Emergency Use Authorization (EUA). This EUA will remain  ?in effect (meaning this test can be used) for the duration of the ?COVID-19 declaration under Section 564(b)(1) of the Act, 21 ?U.S.C.section 360bbb-3(b)(1), unless the authorization is terminated  ?or revoked sooner.  ? ? ?  ? Influenza A by PCR NEGATIVE NEGATIVE Final  ? Influenza B by PCR NEGATIVE NEGATIVE Final  ?  Comment: (NOTE) ?The Xpert Xpress SARS-CoV-2/FLU/RSV plus assay is intended as an aid ?in the diagnosis of influenza from Nasopharyngeal swab specimens and ?should not be used as a sole basis for treatment. Nasal washings and ?aspirates are unacceptable for Xpert Xpress SARS-CoV-2/FLU/RSV ?testing. ? ?Fact Sheet for Patients: ?EntrepreneurPulse.com.au ? ?Fact Sheet for Healthcare Providers: ?IncredibleEmployment.be ? ?This test is not yet approved or cleared by the [place] FDA and ?has  been authorized for detection and/or diagnosis of SARS-CoV-2 by ?FDA under an Emergency Use Authorization (EUA). This EUA will remain ?in effect (meaning this test can be used) for the duration of the ?COVID-19 declaration under Section 564(b)(1) of the Act, 21 U.S.C. ?section 360bbb-3(b)(1), unless the authorization is terminated or ?revoked. ? ?Performed at [facility], [place], ?[place] [address] ?  ?Culture, blood (routine x 2)     Status: None  ? Collection Time: 04/19/21  9:45 PM  ? Specimen: BLOOD  ?Result Value Ref Range Status  ? Specimen Description BLOOD RIGHT ANTECUBITAL  Final  ? Special Requests   Final  ?  BOTTLES DRAWN AEROBIC AND ANAEROBIC Blood Culture adequate volume  ? Culture   Final  ?  NO GROWTH 5 DAYS ?Performed at [facility], [address], [place], [place] [address] ?  ? Report Status 04/24/2021 FINAL  Final  ?Culture, blood (routine x 2)     Status: None  ? Collection Time: 04/19/21 10:32 PM  ? Specimen: BLOOD  ?Result Value Ref Range Status  ? Specimen Description BLOOD LEFT ASSIST CONTROL  Final  ? Special Requests   Final  ?  BOTTLES DRAWN AEROBIC AND ANAEROBIC Blood Culture adequate volume  ? Culture   Final  ?  NO GROWTH 5 DAYS ?Performed at [facility], [address] [place], [place] [address] ?  ? Report Status 04/24/2021 FINAL  Final  ?MRSA Next Gen  by PCR, Nasal     Status: None  ? Collection Time: 04/20/21  2:21 AM  ? Specimen: Nasal Mucosa; Nasal Swab  ?Result Value Ref Range Status  ? MRSA by PCR Next Gen NOT DETECTED NOT DETECTED Final  ?  Comment: (NOTE) ?The GeneXpert MRSA Assay (FDA approved for NASAL specimens only), ?is one component of a comprehensive MRSA colonization surveillance ?program. It is not intended to diagnose MRSA infection nor to guide ?or monitor treatment for MRSA infections. ?Test performance is not FDA approved in patients less than 2 years ?old. ?Performed at [facility], [place], ?[place] [address] ?  ?Urine Culture     Status: None  ? Collection Time: 04/20/21  5:45 AM  ? Specimen: Urine, Clean Catch  ?Result Value Ref Range Status  ? Specimen Description   Final  ?  URINE, CLE

## 2021-04-29 NOTE — Care Management Important Message (Signed)
Important Message ? ?Patient Details  ?Name: Blake West ?MRN: [id number] ?Date of Birth: 11-17-1950 ? ? ?Medicare Important Message Given:  Yes ? ?Patient out of room for dialysis and family had stepped out of room upon time of visit.  Copy of Medicare IM left in room for reference.  ? ? ?Dannette Barbara ?04/29/2021, 12:56 PM ?

## 2021-04-29 NOTE — Progress Notes (Signed)
Hemodialysis Post Treatment Note ? ?Date: 29 April 2021  ? ?Access: Right Internal Jugular Catheter ? ?UF Removed: -6ml  ? ?Next Scheduled Treatment: 05/01/21 ? ?Note: ?Patient completes shorten treatment due to back pain and increasing anxiety. Targeted UF set at 0 ml, unobtained due to shorten treatment. CVC performs without difficulty, maintains the prescribed blood flow rate. During the course of treatment, patient complained of back, medicated with PRN dose of Tylenol without relief, was repositioned, given warm blankets, and ultimately was taken off the machine. Noted was the change in mentation, patient removed monitoring devices, became confused, and restless. Treatment was terminated after consulting with Nephrology NP, who was in suite.  ?

## 2021-04-29 NOTE — Progress Notes (Signed)
Rheumatology Follow Up Note ? ?Chief Complaint  ?Patient presents with  ? Emesis  ? Acute Renal Failure  ? ? ?  ?Subjective:HPI ? ?Patient see on dialysis. He states that he is doing better. He has no fever, new skin rash, wrist or foot drop. He does get joint pains and swelling. He is currently on Prednisone. He is tolerating this well.  ? ?Review of Systems:   ?Review of Systems  ?Constitutional:  Positive for appetite change and fatigue.  ?HENT:  Negative for nosebleeds.   ?Respiratory:  Positive for shortness of breath.   ?Cardiovascular:  Positive for leg swelling.  ?Gastrointestinal:  Positive for nausea.  ?Musculoskeletal:  Positive for arthralgias and joint swelling.  ?Psychiatric/Behavioral:  Positive for sleep disturbance.   ?  ? ?Objective: ? ?Vitals:  ? 04/29/21 1315 04/29/21 1330 04/29/21 1345 04/29/21 1500  ?BP: (!) 149/135 139/79 (!) 130/95 (!) 139/105  ?Pulse: 98 (!) 106 93 (!) 102  ?Temp:   98.1 ?F (36.7 ?C) 98 ?F (36.7 ?C)  ?Resp: 14  20 16   ?Height:      ?Weight:   86 kg   ?SpO2: 97% 96% (!) 80% 96%  ?TempSrc:   Oral   ?BMI (Calculated):   26.45   ?  ?GEN - Pleasant, No Apparent Distress, Tired, On Oxygen; On dialysis ?HEENT - normocephalic and atraumatic. Conjunctiva Clear.   ?Neck - supple with no adenopathy or thyromegaly.  ?Heart - regular rate and rhythm, No murmurs/gallops/rub, Nml S1S2 ?Lungs - clear to auscultation in all fields. ?Extremities - there is no cyanosis; +1 edema. ?Neurological - alert and oriented.  ?Spine - no paraspinal tenderness; no L spine tenderness ?Skin - mild erythema rash of the lower extremity improved ?MSK - The following joints were examined bilaterally: Hands, Wrists, Elbows, Shoulders, Metatarsals, Ankels, Knees and Hips; they were normal apart from what is noted.  ?  ?100% Fist Formation ?PIP and MCP Joint Enlargement (With Synovitis and Tenderness) ?Right Ulnar Styloid Enlargement (Work Injury) ?No Dactylitis ?No Wrist or Foot Drop  ? ?Labs/Imaging ?Reviewed  in EMR ?CBC ?   ?Component Value Date/Time  ? WBC 20.6 (H) 04/29/2021 9629  ? RBC 3.05 (L) 04/29/2021 5284  ? HGB 8.8 (L) 04/29/2021 1324  ? HCT 26.4 (L) 04/29/2021 4010  ? PLT 173 04/29/2021 0651  ? MCV 86.6 04/29/2021 0651  ? MCH 28.9 04/29/2021 0651  ? MCHC 33.3 04/29/2021 0651  ? RDW 13.3 04/29/2021 0651  ? LYMPHSABS 0.9 04/21/2021 0434  ? MONOABS 0.7 04/21/2021 0434  ? EOSABS 1.1 (H) 04/21/2021 0434  ? BASOSABS 0.0 04/21/2021 0434  ? ?CMP  ?   ?Component Value Date/Time  ? NA 134 (L) 04/29/2021 0651  ? NA 138 06/26/2019 1416  ? K 3.5 04/29/2021 0651  ? CL 98 04/29/2021 0651  ? CO2 21 (L) 04/29/2021 0651  ? GLUCOSE 188 (H) 04/29/2021 2725  ? BUN 150 (H) 04/29/2021 3664  ? BUN 16 06/26/2019 1416  ? CREATININE 6.77 (H) 04/29/2021 4034  ? CALCIUM 7.9 (L) 04/29/2021 7425  ? PROT 6.2 (L) 04/21/2021 0434  ? ALBUMIN 3.0 (L) 04/21/2021 0434  ? AST 12 (L) 04/21/2021 0434  ? ALT 10 04/21/2021 0434  ? ALKPHOS 48 04/21/2021 0434  ? BILITOT 1.3 (H) 04/21/2021 0434  ? GFRNONAA 8 (L) 04/29/2021 9563  ? GFRAA >60 07/05/2019 8756  ? ? ? ? ?Normal C3 and C4  ?  ?--12/2020-- ?CMP Cr 3.01, AST 12, ALT 9 ?CBC WBC 10.1,  Hgb 13.7, Hct 41.6, Plt 326 ?CK 103 ?Uric Acid 10.6 (H) ?UA: Large Blood, 3+ Protein ?Pos: P-ANCA 1:160, MPO (7.3) ?Neg: PR3, AntiGBM, ANA IFA ?Neg: Hep B and C; HIV ?  ?CT Abdomen Pelvis: ?1. Bibasilar bronchial wall thickening with scattered nodular ?ground-glass airspace disease. Overall, findings are consistent with ?multifocal bronchopneumonia. Atypical infection could be considered ?given the pattern of airspace disease. ?2. Distal colonic diverticulosis without diverticulitis. ?3. Enlarged prostate. ?4.  Aortic Atherosclerosis (ICD10-I70.0). ?  ?Right Hand Xray (11/2019): ?There are advanced arthropathic changes of the radiocarpal joint consistent with rheumatoid arthritis. Joint space narrowing and subchondral sclerosis at the scaphoid trapezium trapezoid ?articulation is more likely osteoarthritis. There is  significant osteoarthritis involving the D IP joint of the middle finger. ?  ?There are tiny radiopaque foreign bodies projecting between the bases of the second and third fingers, along the soft tissues of the ulnar aspect of the proximal index finger and radial aspect of the ?distal middle finger. These may reside on the skin surface. ? ?Renal biopsy 04/26/21.  Preliminary results an ANCA related disease, 38% fibrous-cellular crescents, 50% interstitial fibrosis with moderate to severe scarring and ATN.  ? ?Assessment and Plan   ?  ?Positive ANCA, MPO with abnormal CT Chest and Renal Failure ?  ?-- Completed Pulse Steroids 1 gm daily for Three Days ?-- Followed by Prednisone Dosing Per Nephrology; Prednisone 60 mg daily ?-- Rituximab dose per nephrology, agree with plan ?-- On Bactrim for prophylaxis  ?  ?2. Abnormal Lung Imaging with pneumonia ?-- Likely due to vasculitis  ?-- Followed by Pulmonology  ?  ?3. Abnormal Hand Xray (11/2019) ?-- Likely due to vasculitis  ?-- Evaluate for inflammatory arthritis as outpatient  ?-- Steroids and Rituxan should help control the inflammation ?

## 2021-04-29 NOTE — Progress Notes (Signed)
PT Cancellation Note ? ?Patient Details ?Name: Blake West ?MRN: [id number] ?DOB: Nov 01, 1950 ? ? ?Cancelled Treatment:    Reason Eval/Treat Not Completed: Other (comment). Pt currently out of room for HD. Will re-attempt another date. ? ? ?[name] ?04/29/2021, 10:56 AM ?Greggory Stallion, PT, DPT, GCS ?[phone] ? ?

## 2021-04-29 NOTE — Progress Notes (Signed)
? ? ? ?PULMONOLOGY ? ? ? ? ? ? ? ? ?Date: 04/29/2021,   ?MRN# [id number] CACE OSORTO [birth year] ? ? ?  ?AdmissionWeight: 83 kg                 ?CurrentWeight: 84.9 kg (bedscale) ? ?Referring provider: Dr Candiss Norse ? ? ?CHIEF COMPLAINT:  ? ?Anca + vasculitis  ? ? ?HISTORY OF PRESENT ILLNESS  ? ?This is a pleasant 71 year old male with a history of CHF with reduced EF, coronary disease gout, dyslipidemia essential hypertension, hypothyroidism testicular dysfunction, type 2 diabetes, history of glomerulonephritis with CKD stage IV came in from outpatient clinic due to provider concerns for ongoing progressive renal failure.  Patient is essentially a lifelong non-smoker reporting smoking in the 1950s.  He does report mild and subacute and chronic cough with expectoration of whitish phlegm.  Patient reports decreased appetite as well as loose stools for less than 1 week prior to arrival.  In the emergency department patient had accelerated hypertension with tachycardia however normoxia on room air.  His initial creatinine was 15 with a GFR of 3 remainder of blood work was essentially unremarkable besides electrolyte derangements including potassium 6.8 without EKG changes.  He was negative for COVID influenza a and B on arrival.  He had chest imaging done without contrast which showed nodular groundglass airspace opacification of the lung.  There was concern for pneumonia and patient is empirically treated with cefepime and vancomycin and Zithromax on arrival.  Also received Lokelma for elevated potassium.  Spinal this patient was lucid without altered mentation.  CT imaging with lung windows were independently reviewed by me with findings of chronic bronchitic changes as well as punctate multifocal bilateral groundglass nodules these findings are nonspecific and can be seen with atypical infection, viral lower respiratory tract infection as well as inflammatory lung disease. ? ?04/29/21- no acute events patient continues to  improve with HD and pt. No signs of ongoing infection ? ?PAST MEDICAL HISTORY  ? ?Past Medical History:  ?Diagnosis Date  ? Abnormal EKG   ? Biventricular failure ([place])   ? CAD (coronary artery disease)   ? Colorectal polyps   ? Gout   ? Hyperlipidemia LDL goal <70   ? Hypertension   ? Hypothyroidism   ? Kidney stone   ? Pneumonia   ? Testicular hypofunction   ? Type 2 diabetes mellitus ([place]) 04/26/2019  ? ? ? ?SURGICAL HISTORY  ? ?Past Surgical History:  ?Procedure Laterality Date  ? CARDIAC CATHETERIZATION    ? DIALYSIS/PERMA CATHETER INSERTION N/A 04/22/2021  ? Procedure: DIALYSIS/PERMA CATHETER INSERTION;  Surgeon: Algernon Huxley, MD;  Location: [facility];  Service: Cardiovascular;  Laterality: N/A;  ? FOOT SURGERY    ? RIGHT/LEFT HEART CATH AND CORONARY ANGIOGRAPHY N/A 04/30/2019  ? Procedure: RIGHT/LEFT HEART CATH AND CORONARY ANGIOGRAPHY;  Surgeon: Troy Sine, MD;  Location: [facility];  Service: Cardiovascular;  Laterality: N/A;  ? ? ? ?FAMILY HISTORY  ? ?Family History  ?Problem Relation Age of Onset  ? Heart attack Mother 52  ? Pulmonary fibrosis Father   ? Congestive Heart Failure Brother   ? Heart attack Brother   ? Heart attack Brother   ? Early death Sister   ? Kidney disease Paternal Grandfather   ? Diabetes Sister   ? ? ? ?SOCIAL HISTORY  ? ?Social History  ? ?Tobacco Use  ? Smoking status: Former  ?  Packs/day: 1.00  ?  Years: 30.00  ?  Pack years: 30.00  ?  Types: Cigarettes  ?  Quit date: 01/24/2002  ?  Years since quitting: 19.2  ? Smokeless tobacco: Never  ?Vaping Use  ? Vaping Use: Never used  ?Substance Use Topics  ? Alcohol use: No  ? Drug use: No  ? ? ? ?MEDICATIONS  ? ? ?Home Medication:  ?  ?Current Medication: ? ?Current Facility-Administered Medications:  ?  0.9 %  sodium chloride infusion, 100 mL, Intravenous, PRN, Lucky Cowboy, Erskine Squibb, MD ?  0.9 %  sodium chloride infusion, 100 mL, Intravenous, PRN, Lucky Cowboy, Erskine Squibb, MD ?  acetaminophen (TYLENOL) tablet 650 mg, 650 mg, Oral, Q6H  PRN, 650 mg at 04/28/21 0245 **OR** acetaminophen (TYLENOL) suppository 650 mg, 650 mg, Rectal, Q6H PRN, Lucky Cowboy, Erskine Squibb, MD ?  acidophilus (RISAQUAD) capsule 2 capsule, 2 capsule, Oral, TID, Dew, Erskine Squibb, MD, 2 capsule at 04/29/21 0851 ?  albuterol (PROVENTIL) (2.5 MG/3ML) 0.083% nebulizer solution 2.5 mg, 2.5 mg, Nebulization, Q6H PRN, Lucky Cowboy, Erskine Squibb, MD ?  alteplase (CATHFLO ACTIVASE) injection 2 mg, 2 mg, Intracatheter, Once PRN, Algernon Huxley, MD ?  alum & mag hydroxide-simeth (MAALOX/MYLANTA) 200-200-20 MG/5ML suspension 15 mL, 15 mL, Oral, Q6H PRN, Algernon Huxley, MD, 15 mL at 04/23/21 1512 ?  apixaban (ELIQUIS) tablet 5 mg, 5 mg, Oral, BID, Enzo Bi, MD, 5 mg at 04/29/21 0851 ?  budesonide (PULMICORT) nebulizer solution 0.5 mg, 0.5 mg, Nebulization, Q6H PRN, Lucky Cowboy, Erskine Squibb, MD ?  calcium carbonate (TUMS - dosed in mg elemental calcium) chewable tablet 200 mg of elemental calcium, 1 tablet, Oral, TID PRN, Algernon Huxley, MD, 200 mg of elemental calcium at 04/29/21 0309 ?  Chlorhexidine Gluconate Cloth 2 % PADS 6 each, 6 each, Topical, Daily, Dew, Erskine Squibb, MD, 6 each at 04/29/21 [phone]  guaiFENesin (MUCINEX) 12 hr tablet 600 mg, 600 mg, Oral, BID, Dew, Erskine Squibb, MD, 600 mg at 04/29/21 8841 ?  haloperidol lactate (HALDOL) injection 2 mg, 2 mg, Intravenous, Q6H PRN, Enzo Bi, MD, 2 mg at 04/28/21 0245 ?  heparin injection 1,000 Units, 1,000 Units, Dialysis, PRN, Algernon Huxley, MD, 1,000 Units at 04/27/21 1154 ?  insulin aspart (novoLOG) injection 0-5 Units, 0-5 Units, Subcutaneous, QHS, Algernon Huxley, MD, 3 Units at 04/27/21 2151 ?  insulin aspart (novoLOG) injection 0-9 Units, 0-9 Units, Subcutaneous, TID WC, Algernon Huxley, MD, 2 Units at 04/29/21 [phone] ?  levothyroxine (SYNTHROID) tablet 25 mcg, 25 mcg, Oral, Q0600, Algernon Huxley, MD, 25 mcg at 04/29/21 0518 ?  lidocaine (PF) (XYLOCAINE) 1 % injection 5 mL, 5 mL, Intradermal, PRN, Lucky Cowboy, Erskine Squibb, MD ?  lidocaine-prilocaine (EMLA) cream 1 application., 1 application., Topical,  PRN, Lucky Cowboy, Erskine Squibb, MD ?  MEDLINE mouth rinse, 15 mL, Mouth Rinse, BID, Enzo Bi, MD, 15 mL at 04/29/21 0851 ?  metoprolol succinate (TOPROL-XL) 24 hr tablet 150 mg, 150 mg, Oral, Daily, Enzo Bi, MD, 150 mg at 04/28/21 0853 ?  metoprolol tartrate (LOPRESSOR) injection 5 mg, 5 mg, Intravenous, Q2H PRN, Lucky Cowboy, Erskine Squibb, MD ?  multivitamin (RENA-VIT) tablet 1 tablet, 1 tablet, Oral, QHS, Enzo Bi, MD, 1 tablet at 04/28/21 2116 ?  ondansetron (ZOFRAN) tablet 4 mg, 4 mg, Oral, Q6H PRN **OR** ondansetron (ZOFRAN) injection 4 mg, 4 mg, Intravenous, Q6H PRN, Algernon Huxley, MD, 4 mg at 04/22/21 1036 ?  ondansetron (ZOFRAN) injection 4 mg, 4 mg, Intravenous, Q6H PRN, Dew, Erskine Squibb, MD, 4  mg at 04/23/21 0546 ?  pentafluoroprop-tetrafluoroeth (GEBAUERS) aerosol 1 application., 1 application., Topical, PRN, Lucky Cowboy, Erskine Squibb, MD ?  predniSONE (DELTASONE) tablet 60 mg, 60 mg, Oral, Q breakfast, Breeze, Shantelle, NP, 60 mg at 04/29/21 0850 ?  prochlorperazine (COMPAZINE) injection 10 mg, 10 mg, Intravenous, Q6H PRN, Enzo Bi, MD, 10 mg at 04/23/21 1919 ?  QUEtiapine (SEROQUEL) tablet 100 mg, 100 mg, Oral, QHS, Enzo Bi, MD, 100 mg at 04/28/21 1949 ? ? ? ?ALLERGIES  ? ?Codeine ? ? ? ? ?REVIEW OF SYSTEMS  ? ? ?Review of Systems: ? ?Gen:  Denies  fever, sweats, chills weigh loss  ?HEENT: Denies blurred vision, double vision, ear pain, eye pain, hearing loss, nose bleeds, sore throat ?Cardiac:  No dizziness, chest pain or heaviness, chest tightness,edema ?Resp:   reports dyspnea chronically  ?Gi: Denies swallowing difficulty, stomach pain, nausea or vomiting, diarrhea, constipation, bowel incontinence ?Gu:  Denies bladder incontinence, burning urine ?Ext:   Denies Joint pain, stiffness or swelling ?Skin: Denies  skin rash, easy bruising or bleeding or hives ?Endoc:  Denies polyuria, polydipsia , polyphagia or weight change ?Psych:   Denies depression, insomnia or hallucinations  ? ?Other:  All other systems negative ? ? ?VS: BP (!) 144/91  (BP Location: Left Arm)   Pulse 90   Temp 98.3 ?F (36.8 ?C)   Resp 16   Ht 5\' 11"  (1.803 m)   Wt 84.9 kg Comment: bedscale  SpO2 94%   BMI 26.11 kg/m?   ? ? ? ?PHYSICAL EXAM  ? ? ?GENERAL:NAD, no fevers, c

## 2021-04-29 NOTE — Progress Notes (Signed)
Mobility Specialist - Progress Note ? ? 04/29/21 1145  ?Mobility  ?Activity Off unit  ? ? ? ?Pt off unit for HD tx. Will attempt session another date/time.  ? ? ?Kathee Delton ?Mobility Specialist ?04/29/21, 11:46 AM ? ? ? ? ?

## 2021-04-29 NOTE — Plan of Care (Signed)

## 2021-04-30 DIAGNOSIS — I7782 Antineutrophilic cytoplasmic antibody (ANCA) vasculitis: Secondary | ICD-10-CM | POA: Diagnosis not present

## 2021-04-30 DIAGNOSIS — E875 Hyperkalemia: Secondary | ICD-10-CM

## 2021-04-30 DIAGNOSIS — E440 Moderate protein-calorie malnutrition: Secondary | ICD-10-CM | POA: Diagnosis not present

## 2021-04-30 DIAGNOSIS — N186 End stage renal disease: Secondary | ICD-10-CM | POA: Diagnosis not present

## 2021-04-30 DIAGNOSIS — J189 Pneumonia, unspecified organism: Secondary | ICD-10-CM

## 2021-04-30 LAB — BASIC METABOLIC PANEL
Anion gap: 14 (ref 5–15)
BUN: 98 mg/dL — ABNORMAL HIGH (ref 8–23)
CO2: 23 mmol/L (ref 22–32)
Calcium: 7.8 mg/dL — ABNORMAL LOW (ref 8.9–10.3)
Chloride: 98 mmol/L (ref 98–111)
Creatinine, Ser: 4.97 mg/dL — ABNORMAL HIGH (ref 0.61–1.24)
GFR, Estimated: 12 mL/min — ABNORMAL LOW (ref >60)
Glucose, Bld: 123 mg/dL — ABNORMAL HIGH (ref 70–99)
Potassium: 2.9 mmol/L — ABNORMAL LOW (ref 3.5–5.1)
Sodium: 135 mmol/L (ref 135–145)

## 2021-04-30 LAB — CBC
HCT: 31.4 % — ABNORMAL LOW (ref 39.0–52.0)
Hemoglobin: 10.2 g/dL — ABNORMAL LOW (ref 13.0–17.0)
MCH: 28.7 pg (ref 26.0–34.0)
MCHC: 32.5 g/dL (ref 30.0–36.0)
MCV: 88.5 fL (ref 80.0–100.0)
Platelets: 229 10*3/uL (ref 150–400)
RBC: 3.55 MIL/uL — ABNORMAL LOW (ref 4.22–5.81)
RDW: 13.4 % (ref 11.5–15.5)
WBC: 27.4 10*3/uL — ABNORMAL HIGH (ref 4.0–10.5)
nRBC: 0 % (ref 0.0–0.2)

## 2021-04-30 LAB — GLUCOSE, CAPILLARY
Glucose-Capillary: 145 mg/dL — ABNORMAL HIGH (ref 70–99)
Glucose-Capillary: 144 mg/dL — ABNORMAL HIGH (ref 70–99)

## 2021-04-30 LAB — MAGNESIUM: Magnesium: 1.9 mg/dL (ref 1.7–2.4)

## 2021-04-30 MED ORDER — RISAQUAD PO CAPS
2.0000 | ORAL_CAPSULE | Freq: Three times a day (TID) | ORAL | 1 refills | Status: DC
Start: 2021-04-30 — End: 2021-08-27

## 2021-04-30 MED ORDER — METOPROLOL SUCCINATE ER 50 MG PO TB24: 150.0000 mg | ORAL_TABLET | Freq: Every day | ORAL | 1 refills | Status: DC

## 2021-04-30 MED ORDER — SULFAMETHOXAZOLE-TRIMETHOPRIM 800-160 MG PO TABS
1.0000 | ORAL_TABLET | ORAL | 1 refills | Status: DC
Start: 2021-05-03 — End: 2021-08-09

## 2021-04-30 MED ORDER — APIXABAN 5 MG PO TABS: 5.0000 mg | ORAL_TABLET | Freq: Two times a day (BID) | ORAL | 0 refills | Status: DC

## 2021-04-30 MED ORDER — PREDNISONE 20 MG PO TABS: 60.0000 mg | ORAL_TABLET | Freq: Every day | ORAL | 0 refills | Status: DC

## 2021-04-30 MED ORDER — QUETIAPINE FUMARATE 100 MG PO TABS
100.0000 mg | ORAL_TABLET | Freq: Every day | ORAL | 1 refills | Status: AC
Start: 2021-04-30 — End: ?

## 2021-04-30 NOTE — Discharge Summary (Signed)
?Physician Discharge Summary ?  ?Patient: Blake West MRN: [id number] DOB: 10/14/1950  ?Admit date:     04/19/2021  ?Discharge date: 04/30/21  ?Discharge Physician: Annita Brod  ? ?PCP: Leonel Ramsay, MD  ? ?Recommendations at discharge:  ? ?New medication: Bactrim DS 1 tablet p.o. 3 times a week ?New medication: Eliquis 5 mg p.o. every 12 hours ?New medication: Prednisone 60 mg p.o. daily.  This medication will continue until patient followed up with by nephrology. ?New medication: Rituximab infusion.  Next dose planned for 4/17 ?New medication: Toprol 50 mg p.o. daily ?New medication: Seroquel 100 mg p.o. nightly ?Patient is starting dialysis at [facility] facility.  Next dialysis scheduled for Monday, 4/10.  He will then proceed on a Monday/Wednesday/Friday schedule ?Patient being discharged with home health PT, OT and RN ? ?Discharge Diagnoses: ?Active Problems: ?  ESRD (end stage renal disease) on dialysis [facility]) ?  Severe sepsis with acute organ dysfunction ([place]) ?  ANCA-associated vasculitis ([place]) ?  Acute hypoxemic respiratory failure ([place]) ?  Multifocal pneumonia ?  Chronic atrial fibrillation with RVR (HCC) ?  Hyperkalemia ?  Leukocytosis ?  Malnutrition of moderate degree ?  Acute urinary retention ?  Delirium ?  Chronic HFrEF (heart failure with reduced ejection fraction) ([place]) ?  Essential hypertension ?  Hypothyroidism ?  Diarrhea ?  Hyperlipidemia ? ?Resolved Problems: ?  * No resolved hospital problems. * ? ?Hospital Course: ?71 year old male with past medical history of chronic systolic heart failure, stage IV chronic kidney disease, history of glomerulonephritis who presented to the emergency room on 3/27 after being sent over from his nephrologist with lab work noting hyperkalemia and worsening renal function.  Patient found to have a creatinine of 15.  Chest x-ray also noted scattered nodular airspace disease concerning for pneumonia.  Patient admitted to the hospital service and  started on antibiotics.  Nephrology consulted and work-up underway for vasculitis.  Renal function continued to decline and patient had dialysis catheter placed and started dialysis on 3/30.  Hospital course complicated by urinary retention which has since resolved and treatment of multifocal pneumonia. ? ? ? ?Assessment and Plan: ?ESRD (end stage renal disease) on dialysis [facility]) ?Prior to hospital presentation was at stage IV chronic kidney disease, and did have previous history of glomerulonephritis.  Patient felt to have ANCA vasculitis.  Now currently on dialysis.  Status post dialysis catheter placement.  Patient has been set up to start dialysis at [facility] on Monday, 4/10.  Last dialysis session was Thursday, 4/6.  Nephrology confirmed okay to wait until Monday for next dialysis. ? ?-vasculitis workup per nephro.  Decreasing steroids.  Kidney biopsy done 4/3 confirming ANCA vasculitis.  Patient started on rituximab and steroids.   ? ?ANCA-associated vasculitis ([place]) ?Patient received Solu-Medrol changed to p.o. prednisone.  Started on rituximab with next dose planned in 10 days.  Seen by pulmonary and rheumatology.  On Bactrim 3 times a week for prophylaxis. ? ?Severe sepsis with acute organ dysfunction ([place]) ?Patient met criteria for severe sepsis on admission with tachycardia, tachypnea, leukocytosis and a severe renal failure and pneumonia source.  Status post IV antibiotics and fluids.  Sepsis itself has resolved. ? ?Acute hypoxemic respiratory failure ([place]) ?Secondary to impart volume overload as well as pneumonia.  Patient still remains hypoxic.  By 4/7, able to be weaned off of oxygen altogether. ? ?Multifocal pneumonia ?Mild elevation of procalcitonin level in the setting of acute renal failure.  Completed Rocephin and  Zithromax for total 5 day abx ? ?Chronic atrial fibrillation with RVR (HCC) ?--increase Toprol to 150 mg daily.  Previous Cardizem discontinued. ?Started on  Eliquis. ? ?Leukocytosis ?Secondary to steroids ? ?Hyperkalemia ?Potassium has normalized.  This is secondary to acute kidney injury as well as severe metabolic acidosis. ? ?Delirium ?Hospital Delirium ?--started to get agitated and combative.  Also noted to not sleep during the night.  Continue as needed Haldol nightly Seroquel.  Somewhat improved.  I suspect he likely has some underlying dementia given sundowning. ? ?Acute urinary retention ?--Pt had a foley inserted on presentation, with indication of acute urinary retention. ?--Foley d/c'ed on 4/2, and pt able to avoid ? ?Malnutrition of moderate degree ?Nutrition Status: ?Nutrition Problem: Moderate Malnutrition ?Etiology: chronic illness (CHF, CKD IV) ?Signs/Symptoms: mild fat depletion, moderate muscle depletion, energy intake < or equal to 75% for > or equal to 1 month ?Interventions: MVI, Nepro shake, Liberalize Diet ? ? ? ? ?Chronic HFrEF (heart failure with reduced ejection fraction) ([place]) ?Acute CHF ruled out.  Echocardiogram now notes ejection fraction of 40 to 45% with indeterminate diastolic function.  On Toprol. ? ?Essential hypertension ?--increase Toprol to 150 mg daily  ?--d/c cardizem due to hx of sCHF ? ?Diarrhea ?Patient has been having diarrhea for 1 week, but none since presentation, so C diff and GI path could not be collected. ? ?Hypothyroidism ?Continue Synthroid. ? ?Hyperlipidemia ?Not currently treated. ? ?High anion gap metabolic acidosis ?This is secondary to acute on chronic renal failure.  ?--s/p Na bicarb gtt ? ? ?Acute renal failure superimposed on stage 4 chronic kidney disease ([place]) ?--Cr 15 on presentation, BUN 247. ?Patient had a chronic kidney disease stage IV, etiology still unclear.  Worsening renal function due to dehydration from diarrhea. ?Patient also has significant hyperkalemia and metabolic acidosis.   ?--there was also concern for vasculitis, and pt has been receiving IV solumedrol ?--dialysis cath placement and  started dialysis on 3/30 ?Plan: ?--vasculitis workup per nephro ?--reduce steroid to prednisone 60 mg daily, per nephro ?--f/u kidney biopsy  ?--Plan for rituximab based on biopsy results ?--need to set up outpatient dialysis ? ? ? ? ?  ? ? ?Consultants: ?Nephrology ?Vascular surgery ?Pulmonary ?Interventional radiology ?Rheumatology ?  ?Procedures: ?Status post renal biopsy ?Placement of dialysis catheter ?Initiation of hemodialysis ? ?Disposition: Home health ?Diet recommendation:  ?Discharge Diet Orders (From admission, onward)  ? ?  Start     Ordered  ? 04/30/21 0000  Diet - low sodium heart healthy       ? 04/30/21 1405  ? ?  ?  ? ?  ? ?Renal diet ?DISCHARGE MEDICATION: ?Allergies as of 04/30/2021   ? ?   Reactions  ? Codeine   ? Joints hurt   ? ?  ? ?  ?Medication List  ?  ? ?STOP taking these medications   ? ?levofloxacin 750 MG tablet ?Commonly known as: LEVAQUIN ?  ?ondansetron 4 MG tablet ?Commonly known as: ZOFRAN ?  ? ?  ? ?TAKE these medications   ? ?acidophilus Caps capsule ?Take 2 capsules by mouth 3 (three) times daily. ?  ?apixaban 5 MG Tabs tablet ?Commonly known as: ELIQUIS ?Take 1 tablet (5 mg total) by mouth 2 (two) times daily. ?  ?calcium-vitamin D 500-5 MG-MCG tablet ?Commonly known as: OSCAL WITH D ?Take 1 tablet by mouth. ?  ?levothyroxine 25 MCG tablet ?Commonly known as: SYNTHROID ?Take 25 mcg by mouth every morning. ?  ?magnesium 30 MG tablet ?  Take 30 mg by mouth 2 (two) times daily. ?  ?metoprolol succinate 50 MG 24 hr tablet ?Commonly known as: TOPROL-XL ?Take 3 tablets (150 mg total) by mouth daily. ?Start taking on: May 01, 2021 ?  ?omega-3 acid ethyl esters 1 g capsule ?Commonly known as: LOVAZA ?Take by mouth 2 (two) times daily. ?  ?predniSONE 20 MG tablet ?Commonly known as: DELTASONE ?Take 3 tablets (60 mg total) by mouth daily with breakfast. ?Start taking on: May 01, 2021 ?  ?QUEtiapine 100 MG tablet ?Commonly known as: SEROQUEL ?Take 1 tablet (100 mg total) by mouth at  bedtime. ?  ?sulfamethoxazole-trimethoprim 800-160 MG tablet ?Commonly known as: BACTRIM DS ?Take 1 tablet by mouth 3 (three) times a week. ?Start taking on: May 03, 2021 ?  ?thiamine 250 MG tablet ?Take 500 mg by mouth daily. ?  ?vitamin

## 2021-04-30 NOTE — Progress Notes (Signed)
Placement Resolved: [facility] MWF 6:30am. Start date Monday 4/10 at 6:00am. Wife notified.  ?

## 2021-04-30 NOTE — TOC Transition Note (Addendum)
Transition of Care (TOC) - CM/SW Discharge Note ? ? ?Patient Details  ?Name: Blake West ?MRN: [id number] ?Date of Birth: [birth year] ? ?Transition of Care (TOC) CM/SW Contact:  ?Jeremiyah Cullens E Julio Storr, LCSW ?Phone Number: ?04/30/2021, 2:07 PM ? ? ?Clinical Narrative:   Patient to DC home today. Corene Cornea with [facility] notified. They will see patient on 4/10, notified MD.   ? ? ? ?Final next level of care: [place] ?Barriers to Discharge: Barriers Resolved ? ? ?Patient Goals and CMS Choice ?  ?  ?  ? ?Discharge Placement ?  ?           ?  ?  ?  ?  ? ?Discharge Plan and Services ?  ?  ?Post Acute Care Choice: Home Health, Durable Medical Equipment          ?  ?  ?  ?  ?  ?HH Arranged: PT, OT, RN ?[place] Agency: [place] ([place]) ?Date HH Agency Contacted: 04/30/21 ?  ?Representative spoke with at [place]: Corene Cornea ? ?Social Determinants of Health (SDOH) Interventions ?  ? ? ?Readmission Risk Interventions ? ?  04/22/2021  ? 12:24 PM  ?Readmission Risk Prevention Plan  ?Transportation Screening Complete  ?PCP or Specialist Appt within 3-5 Days Complete  ?Social Work Consult for [place] Planning/Counseling Complete  ?Palliative Care Screening Not Applicable  ?Medication Review Press photographer) Complete  ? ? ? ? ? ?

## 2021-04-30 NOTE — Progress Notes (Signed)
AVS reviewed at the bedside with patient and family. PIVX1 Removed with no complications and catheter intact. Medications discussed and no complaints or questions to follow. ?

## 2021-04-30 NOTE — Assessment & Plan Note (Signed)
Patient received Solu-Medrol changed to p.o. prednisone.  Started on rituximab with next dose planned in 10 days.  Seen by pulmonary and rheumatology.  On Bactrim 3 times a week for prophylaxis. ?

## 2021-04-30 NOTE — Progress Notes (Signed)
? ? ? ?PULMONOLOGY ? ? ? ? ? ? ? ? ?Date: 04/30/2021,   ?MRN# [id number] Blake West 12-20-1950 ? ? ?  ?AdmissionWeight: 83 kg                 ?CurrentWeight: 86 kg ? ?Referring provider: Dr Candiss Norse ? ? ?CHIEF COMPLAINT:  ? ?Anca + vasculitis  ? ? ?HISTORY OF PRESENT ILLNESS  ? ?This is a pleasant 70 year old male with a history of CHF with reduced EF, coronary disease gout, dyslipidemia essential hypertension, hypothyroidism testicular dysfunction, type 2 diabetes, history of glomerulonephritis with CKD stage IV came in from outpatient clinic due to provider concerns for ongoing progressive renal failure.  Patient is essentially a lifelong non-smoker reporting smoking in the 1950s.  He does report mild and subacute and chronic cough with expectoration of whitish phlegm.  Patient reports decreased appetite as well as loose stools for less than 1 week prior to arrival.  In the emergency department patient had accelerated hypertension with tachycardia however normoxia on room air.  His initial creatinine was 15 with a GFR of 3 remainder of blood work was essentially unremarkable besides electrolyte derangements including potassium 6.8 without EKG changes.  He was negative for COVID influenza a and B on arrival.  He had chest imaging done without contrast which showed nodular groundglass airspace opacification of the lung.  There was concern for pneumonia and patient is empirically treated with cefepime and vancomycin and Zithromax on arrival.  Also received Lokelma for elevated potassium.  Spinal this patient was lucid without altered mentation.  CT imaging with lung windows were independently reviewed by me with findings of chronic bronchitic changes as well as punctate multifocal bilateral groundglass nodules these findings are nonspecific and can be seen with atypical infection, viral lower respiratory tract infection as well as inflammatory lung disease. ? ?04/30/21- patient is stable from pulmonary perspective on room  air.  He is unsteady on feet and mildly confused during interview.  He may have underlying dementia vs acute confusion.  From pulmonary perspective hes doing b etter ? ?PAST MEDICAL HISTORY  ? ?Past Medical History:  ?Diagnosis Date  ? Abnormal EKG   ? Biventricular failure ([place])   ? CAD (coronary artery disease)   ? Colorectal polyps   ? Gout   ? Hyperlipidemia LDL goal <70   ? Hypertension   ? Hypothyroidism   ? Kidney stone   ? Pneumonia   ? Testicular hypofunction   ? Type 2 diabetes mellitus ([place]) 04/26/2019  ? ? ? ?SURGICAL HISTORY  ? ?Past Surgical History:  ?Procedure Laterality Date  ? CARDIAC CATHETERIZATION    ? DIALYSIS/PERMA CATHETER INSERTION N/A 04/22/2021  ? Procedure: DIALYSIS/PERMA CATHETER INSERTION;  Surgeon: Algernon Huxley, MD;  Location: [facility];  Service: Cardiovascular;  Laterality: N/A;  ? FOOT SURGERY    ? RIGHT/LEFT HEART CATH AND CORONARY ANGIOGRAPHY N/A 04/30/2019  ? Procedure: RIGHT/LEFT HEART CATH AND CORONARY ANGIOGRAPHY;  Surgeon: Troy Sine, MD;  Location: [facility];  Service: Cardiovascular;  Laterality: N/A;  ? ? ? ?FAMILY HISTORY  ? ?Family History  ?Problem Relation Age of Onset  ? Heart attack Mother 87  ? Pulmonary fibrosis Father   ? Congestive Heart Failure Brother   ? Heart attack Brother   ? Heart attack Brother   ? Early death Sister   ? Kidney disease Paternal Grandfather   ? Diabetes Sister   ? ? ? ?SOCIAL HISTORY  ? ?  Social History  ? ?Tobacco Use  ? Smoking status: Former  ?  Packs/day: 1.00  ?  Years: 30.00  ?  Pack years: 30.00  ?  Types: Cigarettes  ?  Quit date: 01/24/2002  ?  Years since quitting: 19.2  ? Smokeless tobacco: Never  ?Vaping Use  ? Vaping Use: Never used  ?Substance Use Topics  ? Alcohol use: No  ? Drug use: No  ? ? ? ?MEDICATIONS  ? ? ?Home Medication:  ?  ?Current Medication: ? ?Current Facility-Administered Medications:  ?  0.9 %  sodium chloride infusion, 100 mL, Intravenous, PRN, Lucky Cowboy, Erskine Squibb, MD ?  0.9 %  sodium chloride  infusion, 100 mL, Intravenous, PRN, Lucky Cowboy, Erskine Squibb, MD ?  acetaminophen (TYLENOL) tablet 650 mg, 650 mg, Oral, Q6H PRN, 650 mg at 04/29/21 2211 **OR** acetaminophen (TYLENOL) suppository 650 mg, 650 mg, Rectal, Q6H PRN, Lucky Cowboy, Erskine Squibb, MD ?  acidophilus (RISAQUAD) capsule 2 capsule, 2 capsule, Oral, TID, Lucky Cowboy, Erskine Squibb, MD, 2 capsule at 04/30/21 5732 ?  albuterol (PROVENTIL) (2.5 MG/3ML) 0.083% nebulizer solution 2.5 mg, 2.5 mg, Nebulization, Q6H PRN, Lucky Cowboy, Erskine Squibb, MD ?  albuterol (PROVENTIL) (2.5 MG/3ML) 0.083% nebulizer solution 2.5 mg, 2.5 mg, Nebulization, Once PRN, Gwyneth Revels, Benancio Deeds, NP ?  alteplase (CATHFLO ACTIVASE) injection 2 mg, 2 mg, Intracatheter, Once PRN, Algernon Huxley, MD ?  alum & mag hydroxide-simeth (MAALOX/MYLANTA) 200-200-20 MG/5ML suspension 15 mL, 15 mL, Oral, Q6H PRN, Algernon Huxley, MD, 15 mL at 04/23/21 1512 ?  apixaban (ELIQUIS) tablet 5 mg, 5 mg, Oral, BID, Enzo Bi, MD, 5 mg at 04/30/21 2025 ?  budesonide (PULMICORT) nebulizer solution 0.5 mg, 0.5 mg, Nebulization, Q6H PRN, Algernon Huxley, MD ?  calcium carbonate (TUMS - dosed in mg elemental calcium) chewable tablet 200 mg of elemental calcium, 1 tablet, Oral, TID PRN, Algernon Huxley, MD, 200 mg of elemental calcium at 04/29/21 0309 ?  Chlorhexidine Gluconate Cloth 2 % PADS 6 each, 6 each, Topical, Daily, Dew, Erskine Squibb, MD, 6 each at 04/30/21 0825 ?  diphenhydrAMINE (BENADRYL) injection 50 mg, 50 mg, Intravenous, Once PRN, Gwyneth Revels, Shantelle, NP ?  EPINEPHrine (ADRENALIN) 0.3 mg, 0.3 mg, Intramuscular, Q5 min PRN, Gwyneth Revels, Shantelle, NP ?  famotidine (PEPCID) IVPB 20 mg premix, 20 mg, Intravenous, Once PRN, Colon Flattery, NP ?  guaiFENesin (MUCINEX) 12 hr tablet 600 mg, 600 mg, Oral, BID, Dew, Erskine Squibb, MD, 600 mg at 04/30/21 4270 ?  haloperidol lactate (HALDOL) injection 2 mg, 2 mg, Intravenous, Q6H PRN, Enzo Bi, MD, 2 mg at 04/28/21 0245 ?  heparin injection 1,000 Units, 1,000 Units, Dialysis, PRN, Algernon Huxley, MD, 1,000 Units at 04/27/21  1154 ?  insulin aspart (novoLOG) injection 0-5 Units, 0-5 Units, Subcutaneous, QHS, Algernon Huxley, MD, 3 Units at 04/29/21 2210 ?  insulin aspart (novoLOG) injection 0-9 Units, 0-9 Units, Subcutaneous, TID WC, Dew, Erskine Squibb, MD, 1 Units at 04/30/21 1214 ?  levothyroxine (SYNTHROID) tablet 25 mcg, 25 mcg, Oral, Q0600, Algernon Huxley, MD, 25 mcg at 04/30/21 0558 ?  lidocaine (PF) (XYLOCAINE) 1 % injection 5 mL, 5 mL, Intradermal, PRN, Lucky Cowboy, Erskine Squibb, MD ?  lidocaine-prilocaine (EMLA) cream 1 application., 1 application., Topical, PRN, Lucky Cowboy, Erskine Squibb, MD ?  MEDLINE mouth rinse, 15 mL, Mouth Rinse, BID, Enzo Bi, MD, 15 mL at 04/30/21 0826 ?  methylPREDNISolone sodium succinate (SOLU-MEDROL) 125 mg/2 mL injection 125 mg, 125 mg, Intravenous, Once PRN, Colon Flattery, NP ?  metoprolol  succinate (TOPROL-XL) 24 hr tablet 150 mg, 150 mg, Oral, Daily, Enzo Bi, MD, 150 mg at 04/30/21 5027 ?  metoprolol tartrate (LOPRESSOR) injection 5 mg, 5 mg, Intravenous, Q2H PRN, Lucky Cowboy, Erskine Squibb, MD ?  multivitamin (RENA-VIT) tablet 1 tablet, 1 tablet, Oral, QHS, Enzo Bi, MD, 1 tablet at 04/29/21 2212 ?  ondansetron (ZOFRAN) tablet 4 mg, 4 mg, Oral, Q6H PRN **OR** ondansetron (ZOFRAN) injection 4 mg, 4 mg, Intravenous, Q6H PRN, Algernon Huxley, MD, 4 mg at 04/30/21 1208 ?  ondansetron (ZOFRAN) injection 4 mg, 4 mg, Intravenous, Q6H PRN, Algernon Huxley, MD, 4 mg at 04/23/21 0546 ?  pentafluoroprop-tetrafluoroeth (GEBAUERS) aerosol 1 application., 1 application., Topical, PRN, Lucky Cowboy, Erskine Squibb, MD ?  predniSONE (DELTASONE) tablet 60 mg, 60 mg, Oral, Q breakfast, Breeze, Shantelle, NP, 60 mg at 04/30/21 7412 ?  prochlorperazine (COMPAZINE) injection 10 mg, 10 mg, Intravenous, Q6H PRN, Enzo Bi, MD, 10 mg at 04/23/21 1919 ?  QUEtiapine (SEROQUEL) tablet 100 mg, 100 mg, Oral, QHS, Enzo Bi, MD, 100 mg at 04/29/21 1955 ?  sodium chloride 0.9 % bolus 1,000 mL, 1,000 mL, Intravenous, Once PRN, Colon Flattery, NP ?  sulfamethoxazole-trimethoprim (BACTRIM  DS) 800-160 MG per tablet 1 tablet, 1 tablet, Oral, Once per day on Mon Wed Fri, Breeze, [place], NP, 1 tablet at 04/30/21 1043 ? ? ? ?ALLERGIES  ? ?Codeine ? ? ? ? ?REVIEW OF SYSTEMS  ? ? ?Review of System

## 2021-04-30 NOTE — Progress Notes (Signed)
Mobility Specialist - Progress Note ? ? 04/30/21 1443  ?Mobility  ?Activity Refused mobility  ? ? ? ?Pt supine upon arrival using RA with family at bedside. Pt refuses session d/t upcoming discharge despite encouragement  for assist with pericare. ? ?Blake West ?Mobility Specialist ?04/30/21, 2:45 PM ? ? ? ?

## 2021-04-30 NOTE — Progress Notes (Signed)
?[facility]  ?ROUNDING NOTE  ? ?Subjective:  ? 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  Patient presents to the emergency department with complaints of nausea, vomiting, generalized decline.  Patient has been admitted for Hyperkalemia [E87.5] ?Atrial fibrillation with RVR ([place]) [I48.91] ?Severe sepsis with acute organ dysfunction ([place]) [A41.9, R65.20] ?Acute renal failure, unspecified acute renal failure type ([place]) [N17.9] ? ? ?Update: ? ?Patient underwent HD yesterday. ?Tolerated well. ?Received rituximab today.  ? ?Objective:  ?Vital signs in last 24 hours:  ?Temp:  [97.9 ?F (36.6 ?C)-98.4 ?F (36.9 ?C)] 98 ?F (36.7 ?C) (04/07 1115) ?Pulse Rate:  [54-95] 85 (04/07 1115) ?Resp:  [16-20] 16 (04/07 1115) ?BP: (106-141)/(75-94) 119/92 (04/07 1115) ?SpO2:  [93 %-97 %] 93 % (04/07 1115) ? ?Weight change:  ?Filed Weights  ? 04/27/21 0919 04/27/21 1201 04/29/21 1345  ?Weight: 85.3 kg 84.9 kg 86 kg  ? ? ?Intake/Output: ?I/O last 3 completed shifts: ?In: 480 [P.O.:480] ?Out: 474 [Urine:500] ?  ?Intake/Output this shift: ? No intake/output data recorded. ? ?Physical Exam: ?General: NAD  ?Head: Normocephalic, atraumatic.  Dry oral mucosal membranes  ?Eyes: Anicteric  ?Lungs:  Clear to auscultation, normal effort  ?Heart: Irregular rate and rhythm  ?Abdomen:  Soft, nontender, nondistended  ?Extremities: No peripheral edema.  ?Neurologic: Alert, oriented to self only, moving all four extremities, tremors  ?Skin: No lesions  ?Access: Rt permcath placed on 04/22/21 by Dr Lucky Cowboy  ? ? ?Basic Metabolic Panel: ?Recent Labs  ?Lab 04/26/21 ?5277 04/27/21 ?8242 04/28/21 ?0631 04/29/21 ?3536 04/30/21 ?1443  ?NA 132* 132* 135 134* 135  ?K 3.5 3.4* 3.4* 3.5 2.9*  ?CL 93* 93* 99 98 98  ?CO2 21* 19* 22 21* 23  ?GLUCOSE 176* 193* 155*  188* 123*  ?BUN 121* 149* 123* 150* 98*  ?CREATININE 7.54* 7.97* 5.91* 6.77* 4.97*  ?CALCIUM 8.2* 8.0* 7.8* 7.9* 7.8*  ?MG 2.5* 2.7* 2.1 2.2 1.9  ? ? ? ?Liver Function Tests: ?No results for input(s): AST, ALT, ALKPHOS, BILITOT, PROT, ALBUMIN in the last 168 hours. ? ?No results for input(s): LIPASE, AMYLASE in the last 168 hours. ? ?No results for input(s): AMMONIA in the last 168 hours. ? ?CBC: ?Recent Labs  ?Lab 04/26/21 ?1540 04/27/21 ?0867 04/28/21 ?0631 04/29/21 ?6195 04/30/21 ?0932  ?WBC 10.8* 15.0* 18.9* 20.6* 27.4*  ?HGB 9.0* 9.6* 8.8* 8.8* 10.2*  ?HCT 27.1* 28.5* 26.7* 26.4* 31.4*  ?MCV 86.6 85.6 86.7 86.6 88.5  ?PLT 143* 146* 160 173 229  ? ? ? ?Cardiac Enzymes: ?No results for input(s): CKTOTAL, CKMB, CKMBINDEX, TROPONINI in the last 168 hours. ? ?BNP: ?Invalid input(s): POCBNP ? ?CBG: ?Recent Labs  ?Lab 04/29/21 ?6712 04/29/21 ?1713 04/29/21 ?2203 04/30/21 ?0801 04/30/21 ?1204  ?GLUCAP 168* 226* 281* 145* 144*  ? ? ? ?Microbiology: ?Results for orders placed or performed during the hospital encounter of 04/19/21  ?Resp Panel by RT-PCR (Flu A&B, Covid) Nasopharyngeal Swab     Status: None  ? Collection Time: 04/19/21  9:14 PM  ? Specimen: Nasopharyngeal Swab; Nasopharyngeal(NP) swabs in vial transport medium  ?Result Value Ref Range Status  ? SARS Coronavirus 2 by RT PCR NEGATIVE NEGATIVE Final  ?  Comment: (NOTE) ?SARS-CoV-2 target nucleic acids are NOT DETECTED. ? ?The SARS-CoV-2 RNA is generally detectable in upper respiratory ?specimens during the acute phase of infection. The lowest ?concentration of SARS-CoV-2  viral copies this assay can detect is ?138 copies/mL. A negative result does not preclude SARS-Cov-2 ?infection and should not be used as the sole basis for treatment or ?other patient management decisions. A negative result may occur with  ?improper specimen collection/handling, submission of specimen other ?than nasopharyngeal swab, presence of viral mutation(s) within the ?areas targeted  by this assay, and inadequate number of viral ?copies(<138 copies/mL). A negative result must be combined with ?clinical observations, patient history, and epidemiological ?information. The expected result is Negative. ? ?Fact Sheet for Patients:  ?EntrepreneurPulse.com.au ? ?Fact Sheet for Healthcare Providers:  ?IncredibleEmployment.be ? ?This test is no t yet approved or cleared by the [place] FDA and  ?has been authorized for detection and/or diagnosis of SARS-CoV-2 by ?FDA under an Emergency Use Authorization (EUA). This EUA will remain  ?in effect (meaning this test can be used) for the duration of the ?COVID-19 declaration under Section 564(b)(1) of the Act, 21 ?U.S.C.section 360bbb-3(b)(1), unless the authorization is terminated  ?or revoked sooner.  ? ? ?  ? Influenza A by PCR NEGATIVE NEGATIVE Final  ? Influenza B by PCR NEGATIVE NEGATIVE Final  ?  Comment: (NOTE) ?The Xpert Xpress SARS-CoV-2/FLU/RSV plus assay is intended as an aid ?in the diagnosis of influenza from Nasopharyngeal swab specimens and ?should not be used as a sole basis for treatment. Nasal washings and ?aspirates are unacceptable for Xpert Xpress SARS-CoV-2/FLU/RSV ?testing. ? ?Fact Sheet for Patients: ?EntrepreneurPulse.com.au ? ?Fact Sheet for Healthcare Providers: ?IncredibleEmployment.be ? ?This test is not yet approved or cleared by the [place] FDA and ?has been authorized for detection and/or diagnosis of SARS-CoV-2 by ?FDA under an Emergency Use Authorization (EUA). This EUA will remain ?in effect (meaning this test can be used) for the duration of the ?COVID-19 declaration under Section 564(b)(1) of the Act, 21 U.S.C. ?section 360bbb-3(b)(1), unless the authorization is terminated or ?revoked. ? ?Performed at [facility], [place], ?[place] [address] ?  ?Culture, blood (routine x 2)     Status: None  ? Collection Time:  04/19/21  9:45 PM  ? Specimen: BLOOD  ?Result Value Ref Range Status  ? Specimen Description BLOOD RIGHT ANTECUBITAL  Final  ? Special Requests   Final  ?  BOTTLES DRAWN AEROBIC AND ANAEROBIC Blood Culture adequate volume  ? Culture   Final  ?  NO GROWTH 5 DAYS ?Performed at [facility], [address], [place], [place] [address] ?  ? Report Status 04/24/2021 FINAL  Final  ?Culture, blood (routine x 2)     Status: None  ? Collection Time: 04/19/21 10:32 PM  ? Specimen: BLOOD  ?Result Value Ref Range Status  ? Specimen Description BLOOD LEFT ASSIST CONTROL  Final  ? Special Requests   Final  ?  BOTTLES DRAWN AEROBIC AND ANAEROBIC Blood Culture adequate volume  ? Culture   Final  ?  NO GROWTH 5 DAYS ?Performed at [facility], [address], [place], [place] [address] ?  ? Report Status 04/24/2021 FINAL  Final  ?MRSA Next Gen by PCR, Nasal     Status: None  ? Collection Time: 04/20/21  2:21 AM  ? Specimen: Nasal Mucosa; Nasal Swab  ?Result Value Ref Range Status  ? MRSA by PCR Next Gen NOT DETECTED NOT DETECTED Final  ?  Comment: (NOTE) ?The GeneXpert MRSA Assay (FDA approved for NASAL specimens only), ?is one component of a comprehensive MRSA colonization surveillance ?program. It is not intended to  diagnose MRSA infection nor to guide ?or monitor treatment for MRSA infections. ?Test performance is not FDA approved in patients less than 2 years ?old. ?Performed at [facility], [place], ?[place] [address] ?  ?Urine Culture     Status: None  ? Collection Time: 04/20/21  5:45 AM  ? Specimen: Urine, Clean Catch  ?Result Value Ref Range Status  ? Specimen Description   Final  ?  URINE, CLEAN CATCH ?Performed at [facility], [address], [place], [place] [address] ?  ? Special Requests   Final  ?  NONE ?Performed at [facility], [address] [place], [place] [address] ?  ? Culture   Final  ?  NO GROWTH ?Performed at [facility], [place] [address], [place], [place] [address] ?  ? Report Status 04/21/2021 FINAL  Final  ?Aspergillus Ag, BAL/Serum     Status: None  ? Collection Time: 04/22/21  4:14 AM  ? Specimen: Vein  ?Result Value Ref Range Status

## 2021-04-30 NOTE — Plan of Care (Signed)

## 2021-04-30 NOTE — Plan of Care (Signed)
?  Problem: Education: ?Goal: Knowledge of General Education information will improve ?Description: Including pain rating scale, medication(s)/side effects and non-pharmacologic comfort measures ?Outcome: Completed/Met ?  ?

## 2021-04-30 NOTE — Progress Notes (Signed)
Occupational Therapy Treatment ?Patient Details ?Name: Blake West ?MRN: [id number] ?DOB: 1950/08/31 ?Today's Date: 04/30/2021 ? ? ?History of present illness 71 year old male with history of biventricular heart failure, hypertension, gout, chronic heart failure reduced ejection fraction, history of glomerulonephritis, CKD stage IV, who presents emergency department from outpatient clinic for chief concerns of renal failure.  Patient has been having coughing and shortness of breath for 2 weeks, nausea, vomiting and diarrhea for 1 week. Pt admitted for severe sepsis with acute organ dysfunction most likely due to pneumonia ?  ?OT comments ? Pt seen for OT tx this date. Pt received in bed, denies complaints, and agreeable to session. Pt completed bed mobility with supervision, CGA for ADL transfers with additional instruction in hand placement on RW vs EOB to improve technique with pt demonstrating increased safety with new strategy. Pt ambulated from bed to the sink and to the door of his room (~40' total) with SBA using RW and no overt LOB. SpO2 improved to 91-93% on room air with mobility. Once returned to bed, 89-92% on RA at rest. Pt endorsed feeling like mobility was 5/10 perceived rate of exertion. Pt also endorses that bathing at home is the most tiring ADL he completes in a day. Pt educated in home/routines modifications, falls prevention, and activity pacing to support showering and grooming at home. Pt states he has access to a shower chair but does not use a shower chair. He does endorse becoming very fatigued after showering. Encouraged him to use shower chair to sit for portion of shower to improve safety and minimize over exertion. Pt progressing towards goals, continues to benefit from skilled OT services.  ?  ? ?Recommendations for follow up therapy are one component of a multi-disciplinary discharge planning process, led by the attending physician.  Recommendations may be updated based on patient  status, additional functional criteria and insurance authorization. ?   ?Follow Up Recommendations ? Home health OT  ?  ?Assistance Recommended at Discharge Frequent or constant Supervision/Assistance  ?Patient can return home with the following ? A lot of help with bathing/dressing/bathroom;A lot of help with walking and/or transfers ?  ?Equipment Recommendations ? BSC/3in1  ?  ?Recommendations for Other Services   ? ?  ?Precautions / Restrictions Precautions ?Precautions: Fall ?Precaution Comments: orthostatics ?Restrictions ?Weight Bearing Restrictions: No  ? ? ?  ? ?Mobility Bed Mobility ?Overal bed mobility: Needs Assistance ?Bed Mobility: Supine to Sit, Sit to Supine ?  ?  ?Supine to sit: Supervision ?Sit to supine: Supervision ?  ?General bed mobility comments: no assist required, able to reposition himself using BUE on headboard with bed flat to pull himself up as well at end of session ?  ? ?Transfers ?Overall transfer level: Needs assistance ?Equipment used: Rolling walker (2 wheels) ?Transfers: Sit to/from Stand ?Sit to Stand: Min guard ?  ?  ?  ?  ?  ?General transfer comment: VC for hand placement to improve transfer technique ?  ?  ?Balance Overall balance assessment: Needs assistance ?Sitting-balance support: No upper extremity supported, Feet supported ?Sitting balance-Leahy Scale: Fair ?  ?  ?Standing balance support: Bilateral upper extremity supported, During functional activity ?Standing balance-Leahy Scale: Fair ?  ?  ?  ?  ?  ?  ?  ?  ?  ?  ?  ?  ?   ? ?ADL either performed or assessed with clinical judgement  ? ?ADL Overall ADL's : Needs assistance/impaired ?  ?  ?Grooming: Standing;Supervision/safety ?Grooming Details (  indicate cue type and reason): standing inside RW, SBA for grooming tasks at the sink ?  ?  ?  ?  ?  ?  ?  ?  ?  ?  ?  ?  ?  ?  ?Functional mobility during ADLs: Min guard;Rolling walker (2 wheels) ?  ?  ? ?Extremity/Trunk Assessment   ?  ?  ?  ?  ?  ? ?Vision   ?  ?   ?Perception   ?  ?Praxis   ?  ? ?Cognition Arousal/Alertness: Awake/alert ?Behavior During Therapy: [facility] for tasks assessed/performed ?Overall Cognitive Status: Within Functional Limits for tasks assessed ?  ?  ?  ?  ?  ?  ?  ?  ?  ?  ?  ?  ?  ?  ?  ?  ?  ?  ?  ?   ?Exercises Other Exercises ?Other Exercises: Pt educated in home/routines modifications, falls prevention, and activity pacing to support showering and grooming at home. Pt states he has access to a shower chair but does not use a shower chair. He does endorse becoming very fatigued after showering. Encouraged him to use shower chair to sit for portion of shower to improve safety and minimize over exertion. ? ?  ?Shoulder Instructions   ? ? ?  ?General Comments Treatment session focused on education and training.  ? ? ?Pertinent Vitals/ Pain       Pain Assessment ?Pain Assessment: No/denies pain ? ?Home Living   ?  ?  ?  ?  ?  ?  ?  ?  ?  ?  ?  ?  ?  ?  ?  ?  ?  ?  ? ?  ?Prior Functioning/Environment    ?  ?  ?  ?   ? ?Frequency ? Min 2X/week  ? ? ? ? ?  ?Progress Toward Goals ? ?OT Goals(current goals can now be found in the care plan section) ? Progress towards OT goals: Progressing toward goals ? ?Acute Rehab OT Goals ?Patient Stated Goal: to get stronger ?OT Goal Formulation: With patient/family ?Time For Goal Achievement: 05/05/21 ?Potential to Achieve Goals: Good  ?Plan Discharge plan remains appropriate;Frequency remains appropriate   ? ?Co-evaluation ? ? ?   ?  ?  ?  ?  ? ?  ?AM-PAC OT "6 Clicks" Daily Activity     ?Outcome Measure ? ? Help from another person eating meals?: None ?Help from another person taking care of personal grooming?: A Little ?Help from another person toileting, which includes using toliet, bedpan, or urinal?: A Little ?Help from another person bathing (including washing, rinsing, drying)?: A Lot ?Help from another person to put on and taking off regular upper body clothing?: None ?Help from another person to put on and  taking off regular lower body clothing?: A Little ?6 Click Score: 19 ? ?  ?End of Session Equipment Utilized During Treatment: Rolling walker (2 wheels) ? ?OT Visit Diagnosis: Unsteadiness on feet (R26.81);Muscle weakness (generalized) (M62.81) ?  ?Activity Tolerance Patient tolerated treatment well ?  ?Patient Left in bed;with call bell/phone within reach;with bed alarm set ?  ?Nurse Communication Mobility status (O2 sats) ?  ? ?   ? ?Time: 5621-3086 ?OT Time Calculation (min): 19 min ? ?Charges: OT General Charges ?$OT Visit: 1 Visit ?OT Treatments ?$Self Care/Home Management : 8-22 mins ? ?Ardeth Perfect., MPH, MS, OTR/L ?ascom [phone] ?04/30/21, 2:29 PM ?

## 2021-04-30 NOTE — Progress Notes (Signed)
Physical Therapy Treatment ?Patient Details ?Name: Blake West ?MRN: [id number] ?DOB: [birth year] ?Today's Date: 04/30/2021 ? ? ?History of Present Illness 71 year old male with history of biventricular heart failure, hypertension, gout, chronic heart failure reduced ejection fraction, history of glomerulonephritis, CKD stage IV, who presents emergency department from outpatient clinic for chief concerns of renal failure.  Patient has been having coughing and shortness of breath for 2 weeks, nausea, vomiting and diarrhea for 1 week. Pt admitted for severe sepsis with acute organ dysfunction most likely due to pneumonia ? ?  ?PT Comments  ? ? PT session focused primarily on education and training with pt and spouse. Explanation and handouts given for B LE HEP, car transfers, light weight transfer w/c, and various options to negotiate 2 steps to enter home. (Pt declined mobility today). Both pt and spouse with good understanding of each and feel comfortable with assisting pt into home once discharged.  Plan for HHPT services as well.  ?  ?Recommendations for follow up therapy are one component of a multi-disciplinary discharge planning process, led by the attending physician.  Recommendations may be updated based on patient status, additional functional criteria and insurance authorization. ? ?Follow Up Recommendations ? Home health PT ?  ?  ?Assistance Recommended at Discharge Frequent or constant Supervision/Assistance  ?Patient can return home with the following Help with stairs or ramp for entrance;Assist for transportation;A little help with bathing/dressing/bathroom;A lot of help with walking and/or transfers;Assistance with cooking/housework ?  ?Equipment Recommendations ?  (Family to purchase transport light weight chair)  ?  ?Recommendations for Other Services   ? ? ?  ?Precautions / Restrictions Precautions ?Precautions: Fall ?Precaution Comments: orthostatics ?Restrictions ?Weight Bearing Restrictions: No  ?   ? ?Mobility ? Bed Mobility ?Overal bed mobility: Needs Assistance ?Bed Mobility: Supine to Sit, Sit to Supine ?  ?  ?Supine to sit: Supervision ?Sit to supine: Supervision ?  ?  ?  ? ?Transfers ?  ?  ?  ?  ?  ?  ?  ?  ?  ?  ?  ? ?Ambulation/Gait ?  ?  ?  ?  ?  ?  ?  ?General Gait Details: declined ? ? ?Stairs ?Stairs:  (declined, education provided) ?  ?  ?  ?  ? ? ?Wheelchair Mobility ?  ? ?Modified Rankin (Stroke Patients Only) ?  ? ? ?  ?Balance   ?  ?  ?  ?  ?  ?  ?  ?  ?  ?  ?  ?  ?  ?  ?  ?  ?  ?  ?  ? ?  ?Cognition Arousal/Alertness: Awake/alert ?Behavior During Therapy: [facility] for tasks assessed/performed ?Overall Cognitive Status: Within Functional Limits for tasks assessed ?  ?  ?  ?  ?  ?  ?  ?  ?  ?  ?  ?  ?  ?  ?  ?  ?General Comments: Asking and responding to questions appropriately ?  ?  ? ?  ?Exercises   ? ?  ?General Comments General comments (skin integrity, edema, etc.): Treatment session focused on education and training. ?  ?  ? ?Pertinent Vitals/Pain Pain Assessment ?Pain Assessment: No/denies pain  ? ? ?Home Living   ?  ?  ?  ?  ?  ?  ?  ?  ?  ?   ?  ?Prior Function    ?  ?  ?   ? ?PT Goals (current  goals can now be found in the care plan section) Acute Rehab PT Goals ?Patient Stated Goal: to get better ? ?  ?Frequency ? ? ? Min 2X/week ? ? ? ?  ?PT Plan Current plan remains appropriate  ? ? ?Co-evaluation   ?  ?  ?  ?  ? ?  ?AM-PAC PT "6 Clicks" Mobility   ?Outcome Measure ? Help needed turning from your back to your side while in a flat bed without using bedrails?: A Little ?Help needed moving from lying on your back to sitting on the side of a flat bed without using bedrails?: A Little ?Help needed moving to and from a bed to a chair (including a wheelchair)?: A Little ?Help needed standing up from a chair using your arms (e.g., wheelchair or bedside chair)?: A Little ?Help needed to walk in hospital room?: A Little ?Help needed climbing 3-5 steps with a railing? : A Lot ?6 Click Score:  17 ? ?  ?End of Session Equipment Utilized During Treatment: Oxygen ?Activity Tolerance: Patient limited by fatigue ?Patient left: in bed;with bed alarm set;with family/visitor present ?  ?PT Visit Diagnosis: Unsteadiness on feet (R26.81);Muscle weakness (generalized) (M62.81);Difficulty in walking, not elsewhere classified (R26.2);Dizziness and giddiness (R42);Pain ?  ? ? ?Time: 1050-1120 ?PT Time Calculation (min) (ACUTE ONLY): 30 min ? ?Charges:  $Therapeutic Activity: 23-37 mins          ?          ?[name], PTA ? ? ? ?[name] ?04/30/2021, 11:57 AM ? ?

## 2021-05-03 DIAGNOSIS — E039 Hypothyroidism, unspecified: Secondary | ICD-10-CM | POA: Diagnosis not present

## 2021-05-03 DIAGNOSIS — J189 Pneumonia, unspecified organism: Secondary | ICD-10-CM | POA: Diagnosis not present

## 2021-05-03 DIAGNOSIS — N184 Chronic kidney disease, stage 4 (severe): Secondary | ICD-10-CM | POA: Diagnosis not present

## 2021-05-03 DIAGNOSIS — M109 Gout, unspecified: Secondary | ICD-10-CM | POA: Diagnosis not present

## 2021-05-03 DIAGNOSIS — I482 Chronic atrial fibrillation, unspecified: Secondary | ICD-10-CM | POA: Diagnosis not present

## 2021-05-03 DIAGNOSIS — I700 Atherosclerosis of aorta: Secondary | ICD-10-CM | POA: Diagnosis not present

## 2021-05-03 DIAGNOSIS — Z992 Dependence on renal dialysis: Secondary | ICD-10-CM | POA: Diagnosis not present

## 2021-05-03 DIAGNOSIS — Z955 Presence of coronary angioplasty implant and graft: Secondary | ICD-10-CM | POA: Diagnosis not present

## 2021-05-03 DIAGNOSIS — R739 Hyperglycemia, unspecified: Secondary | ICD-10-CM | POA: Diagnosis not present

## 2021-05-03 DIAGNOSIS — Z7952 Long term (current) use of systemic steroids: Secondary | ICD-10-CM | POA: Diagnosis not present

## 2021-05-03 DIAGNOSIS — N401 Enlarged prostate with lower urinary tract symptoms: Secondary | ICD-10-CM | POA: Diagnosis not present

## 2021-05-03 DIAGNOSIS — Z7901 Long term (current) use of anticoagulants: Secondary | ICD-10-CM | POA: Diagnosis not present

## 2021-05-03 DIAGNOSIS — I2510 Atherosclerotic heart disease of native coronary artery without angina pectoris: Secondary | ICD-10-CM | POA: Diagnosis not present

## 2021-05-03 DIAGNOSIS — N186 End stage renal disease: Secondary | ICD-10-CM

## 2021-05-03 DIAGNOSIS — D649 Anemia, unspecified: Secondary | ICD-10-CM | POA: Diagnosis not present

## 2021-05-03 DIAGNOSIS — I132 Hypertensive heart and chronic kidney disease with heart failure and with stage 5 chronic kidney disease, or end stage renal disease: Secondary | ICD-10-CM | POA: Diagnosis not present

## 2021-05-03 DIAGNOSIS — K5730 Diverticulosis of large intestine without perforation or abscess without bleeding: Secondary | ICD-10-CM | POA: Diagnosis not present

## 2021-05-03 DIAGNOSIS — E440 Moderate protein-calorie malnutrition: Secondary | ICD-10-CM | POA: Diagnosis not present

## 2021-05-03 DIAGNOSIS — I5022 Chronic systolic (congestive) heart failure: Secondary | ICD-10-CM | POA: Diagnosis not present

## 2021-05-03 DIAGNOSIS — J9601 Acute respiratory failure with hypoxia: Secondary | ICD-10-CM | POA: Diagnosis not present

## 2021-05-03 DIAGNOSIS — N39498 Other specified urinary incontinence: Secondary | ICD-10-CM | POA: Diagnosis not present

## 2021-05-03 DIAGNOSIS — N179 Acute kidney failure, unspecified: Secondary | ICD-10-CM | POA: Diagnosis not present

## 2021-05-03 DIAGNOSIS — I7782 Antineutrophilic cytoplasmic antibody (ANCA) vasculitis: Secondary | ICD-10-CM | POA: Diagnosis not present

## 2021-05-04 ENCOUNTER — Encounter: Payer: Self-pay | Admitting: Nephrology

## 2021-05-04 DIAGNOSIS — E440 Moderate protein-calorie malnutrition: Secondary | ICD-10-CM | POA: Diagnosis not present

## 2021-05-04 DIAGNOSIS — E039 Hypothyroidism, unspecified: Secondary | ICD-10-CM | POA: Diagnosis not present

## 2021-05-04 DIAGNOSIS — M109 Gout, unspecified: Secondary | ICD-10-CM | POA: Diagnosis not present

## 2021-05-04 DIAGNOSIS — I482 Chronic atrial fibrillation, unspecified: Secondary | ICD-10-CM | POA: Diagnosis not present

## 2021-05-04 DIAGNOSIS — N186 End stage renal disease: Secondary | ICD-10-CM | POA: Diagnosis not present

## 2021-05-04 DIAGNOSIS — I5022 Chronic systolic (congestive) heart failure: Secondary | ICD-10-CM | POA: Diagnosis not present

## 2021-05-04 DIAGNOSIS — I2510 Atherosclerotic heart disease of native coronary artery without angina pectoris: Secondary | ICD-10-CM | POA: Diagnosis not present

## 2021-05-04 DIAGNOSIS — J189 Pneumonia, unspecified organism: Secondary | ICD-10-CM | POA: Diagnosis not present

## 2021-05-04 DIAGNOSIS — I428 Other cardiomyopathies: Secondary | ICD-10-CM | POA: Diagnosis not present

## 2021-05-04 DIAGNOSIS — Z955 Presence of coronary angioplasty implant and graft: Secondary | ICD-10-CM | POA: Diagnosis not present

## 2021-05-04 DIAGNOSIS — I4891 Unspecified atrial fibrillation: Secondary | ICD-10-CM | POA: Diagnosis not present

## 2021-05-04 DIAGNOSIS — E8770 Fluid overload, unspecified: Secondary | ICD-10-CM | POA: Diagnosis not present

## 2021-05-04 DIAGNOSIS — I700 Atherosclerosis of aorta: Secondary | ICD-10-CM | POA: Diagnosis not present

## 2021-05-04 DIAGNOSIS — J9601 Acute respiratory failure with hypoxia: Secondary | ICD-10-CM | POA: Diagnosis not present

## 2021-05-04 DIAGNOSIS — I7782 Antineutrophilic cytoplasmic antibody (ANCA) vasculitis: Secondary | ICD-10-CM | POA: Diagnosis not present

## 2021-05-04 DIAGNOSIS — I132 Hypertensive heart and chronic kidney disease with heart failure and with stage 5 chronic kidney disease, or end stage renal disease: Secondary | ICD-10-CM | POA: Diagnosis not present

## 2021-05-04 DIAGNOSIS — E8779 Other fluid overload: Secondary | ICD-10-CM | POA: Diagnosis not present

## 2021-05-04 DIAGNOSIS — Z7952 Long term (current) use of systemic steroids: Secondary | ICD-10-CM | POA: Diagnosis not present

## 2021-05-04 DIAGNOSIS — Z09 Encounter for follow-up examination after completed treatment for conditions other than malignant neoplasm: Secondary | ICD-10-CM | POA: Diagnosis not present

## 2021-05-04 DIAGNOSIS — N179 Acute kidney failure, unspecified: Secondary | ICD-10-CM | POA: Diagnosis not present

## 2021-05-04 DIAGNOSIS — Z7901 Long term (current) use of anticoagulants: Secondary | ICD-10-CM | POA: Diagnosis not present

## 2021-05-04 DIAGNOSIS — N39498 Other specified urinary incontinence: Secondary | ICD-10-CM | POA: Diagnosis not present

## 2021-05-04 DIAGNOSIS — Z992 Dependence on renal dialysis: Secondary | ICD-10-CM | POA: Diagnosis not present

## 2021-05-04 DIAGNOSIS — N401 Enlarged prostate with lower urinary tract symptoms: Secondary | ICD-10-CM | POA: Diagnosis not present

## 2021-05-04 DIAGNOSIS — N184 Chronic kidney disease, stage 4 (severe): Secondary | ICD-10-CM | POA: Diagnosis not present

## 2021-05-04 DIAGNOSIS — K5730 Diverticulosis of large intestine without perforation or abscess without bleeding: Secondary | ICD-10-CM | POA: Diagnosis not present

## 2021-05-04 DIAGNOSIS — R739 Hyperglycemia, unspecified: Secondary | ICD-10-CM | POA: Diagnosis not present

## 2021-05-04 LAB — SURGICAL PATHOLOGY

## 2021-05-06 DIAGNOSIS — R338 Other retention of urine: Secondary | ICD-10-CM | POA: Diagnosis not present

## 2021-05-06 DIAGNOSIS — R410 Disorientation, unspecified: Secondary | ICD-10-CM | POA: Diagnosis not present

## 2021-05-11 ENCOUNTER — Inpatient Hospital Stay: Payer: PPO

## 2021-05-11 ENCOUNTER — Encounter: Payer: Self-pay | Admitting: Emergency Medicine

## 2021-05-11 ENCOUNTER — Inpatient Hospital Stay
Admission: EM | Admit: 2021-05-11 | Discharge: 2021-05-18 | DRG: 377 | Disposition: A | Discharge status: Home Health | Payer: PPO | Attending: Internal Medicine | Admitting: Internal Medicine

## 2021-05-11 ENCOUNTER — Other Ambulatory Visit: Payer: Self-pay

## 2021-05-11 DIAGNOSIS — I5082 Biventricular heart failure: Secondary | ICD-10-CM | POA: Diagnosis present

## 2021-05-11 DIAGNOSIS — J9601 Acute respiratory failure with hypoxia: Secondary | ICD-10-CM | POA: Diagnosis not present

## 2021-05-11 DIAGNOSIS — E1122 Type 2 diabetes mellitus with diabetic chronic kidney disease: Secondary | ICD-10-CM | POA: Diagnosis not present

## 2021-05-11 DIAGNOSIS — N179 Acute kidney failure, unspecified: Secondary | ICD-10-CM | POA: Diagnosis not present

## 2021-05-11 DIAGNOSIS — E559 Vitamin D deficiency, unspecified: Secondary | ICD-10-CM | POA: Diagnosis present

## 2021-05-11 DIAGNOSIS — N184 Chronic kidney disease, stage 4 (severe): Secondary | ICD-10-CM | POA: Diagnosis not present

## 2021-05-11 DIAGNOSIS — D62 Acute posthemorrhagic anemia: Secondary | ICD-10-CM | POA: Diagnosis present

## 2021-05-11 DIAGNOSIS — I10 Essential (primary) hypertension: Secondary | ICD-10-CM | POA: Diagnosis not present

## 2021-05-11 DIAGNOSIS — J189 Pneumonia, unspecified organism: Secondary | ICD-10-CM | POA: Diagnosis not present

## 2021-05-11 DIAGNOSIS — E875 Hyperkalemia: Secondary | ICD-10-CM | POA: Diagnosis present

## 2021-05-11 DIAGNOSIS — N186 End stage renal disease: Secondary | ICD-10-CM | POA: Diagnosis present

## 2021-05-11 DIAGNOSIS — I5022 Chronic systolic (congestive) heart failure: Secondary | ICD-10-CM | POA: Diagnosis not present

## 2021-05-11 DIAGNOSIS — D849 Immunodeficiency, unspecified: Secondary | ICD-10-CM | POA: Diagnosis present

## 2021-05-11 DIAGNOSIS — R778 Other specified abnormalities of plasma proteins: Secondary | ICD-10-CM | POA: Diagnosis present

## 2021-05-11 DIAGNOSIS — I2510 Atherosclerotic heart disease of native coronary artery without angina pectoris: Secondary | ICD-10-CM | POA: Diagnosis not present

## 2021-05-11 DIAGNOSIS — I132 Hypertensive heart and chronic kidney disease with heart failure and with stage 5 chronic kidney disease, or end stage renal disease: Secondary | ICD-10-CM | POA: Diagnosis not present

## 2021-05-11 DIAGNOSIS — K921 Melena: Secondary | ICD-10-CM | POA: Diagnosis not present

## 2021-05-11 DIAGNOSIS — N170 Acute kidney failure with tubular necrosis: Secondary | ICD-10-CM | POA: Diagnosis not present

## 2021-05-11 DIAGNOSIS — R578 Other shock: Secondary | ICD-10-CM | POA: Diagnosis present

## 2021-05-11 DIAGNOSIS — R600 Localized edema: Secondary | ICD-10-CM | POA: Diagnosis not present

## 2021-05-11 DIAGNOSIS — N017 Rapidly progressive nephritic syndrome with diffuse crescentic glomerulonephritis: Secondary | ICD-10-CM | POA: Diagnosis not present

## 2021-05-11 DIAGNOSIS — K269 Duodenal ulcer, unspecified as acute or chronic, without hemorrhage or perforation: Secondary | ICD-10-CM | POA: Diagnosis not present

## 2021-05-11 DIAGNOSIS — T380X5A Adverse effect of glucocorticoids and synthetic analogues, initial encounter: Secondary | ICD-10-CM | POA: Diagnosis present

## 2021-05-11 DIAGNOSIS — Z8719 Personal history of other diseases of the digestive system: Secondary | ICD-10-CM | POA: Diagnosis not present

## 2021-05-11 DIAGNOSIS — E039 Hypothyroidism, unspecified: Secondary | ICD-10-CM | POA: Diagnosis present

## 2021-05-11 DIAGNOSIS — D72829 Elevated white blood cell count, unspecified: Secondary | ICD-10-CM | POA: Diagnosis present

## 2021-05-11 DIAGNOSIS — K922 Gastrointestinal hemorrhage, unspecified: Secondary | ICD-10-CM | POA: Diagnosis not present

## 2021-05-11 DIAGNOSIS — E872 Acidosis, unspecified: Secondary | ICD-10-CM | POA: Diagnosis present

## 2021-05-11 DIAGNOSIS — Z992 Dependence on renal dialysis: Secondary | ICD-10-CM | POA: Diagnosis not present

## 2021-05-11 DIAGNOSIS — M109 Gout, unspecified: Secondary | ICD-10-CM | POA: Diagnosis not present

## 2021-05-11 DIAGNOSIS — Z79890 Hormone replacement therapy (postmenopausal): Secondary | ICD-10-CM

## 2021-05-11 DIAGNOSIS — K264 Chronic or unspecified duodenal ulcer with hemorrhage: Principal | ICD-10-CM | POA: Diagnosis present

## 2021-05-11 DIAGNOSIS — K5730 Diverticulosis of large intestine without perforation or abscess without bleeding: Secondary | ICD-10-CM | POA: Diagnosis not present

## 2021-05-11 DIAGNOSIS — R6510 Systemic inflammatory response syndrome (SIRS) of non-infectious origin without acute organ dysfunction: Secondary | ICD-10-CM | POA: Diagnosis not present

## 2021-05-11 DIAGNOSIS — Z87891 Personal history of nicotine dependence: Secondary | ICD-10-CM

## 2021-05-11 DIAGNOSIS — I7782 Antineutrophilic cytoplasmic antibody (ANCA) vasculitis: Secondary | ICD-10-CM | POA: Diagnosis present

## 2021-05-11 DIAGNOSIS — Z885 Allergy status to narcotic agent status: Secondary | ICD-10-CM

## 2021-05-11 DIAGNOSIS — Z833 Family history of diabetes mellitus: Secondary | ICD-10-CM

## 2021-05-11 DIAGNOSIS — Z8249 Family history of ischemic heart disease and other diseases of the circulatory system: Secondary | ICD-10-CM

## 2021-05-11 DIAGNOSIS — Z452 Encounter for adjustment and management of vascular access device: Secondary | ICD-10-CM | POA: Diagnosis not present

## 2021-05-11 DIAGNOSIS — I482 Chronic atrial fibrillation, unspecified: Secondary | ICD-10-CM | POA: Diagnosis not present

## 2021-05-11 DIAGNOSIS — E876 Hypokalemia: Secondary | ICD-10-CM | POA: Diagnosis not present

## 2021-05-11 DIAGNOSIS — E119 Type 2 diabetes mellitus without complications: Secondary | ICD-10-CM

## 2021-05-11 DIAGNOSIS — E785 Hyperlipidemia, unspecified: Secondary | ICD-10-CM | POA: Diagnosis present

## 2021-05-11 DIAGNOSIS — D631 Anemia in chronic kidney disease: Secondary | ICD-10-CM | POA: Diagnosis not present

## 2021-05-11 DIAGNOSIS — I152 Hypertension secondary to endocrine disorders: Secondary | ICD-10-CM | POA: Diagnosis present

## 2021-05-11 DIAGNOSIS — Z7901 Long term (current) use of anticoagulants: Secondary | ICD-10-CM

## 2021-05-11 DIAGNOSIS — R918 Other nonspecific abnormal finding of lung field: Secondary | ICD-10-CM | POA: Diagnosis not present

## 2021-05-11 DIAGNOSIS — K644 Residual hemorrhoidal skin tags: Secondary | ICD-10-CM | POA: Diagnosis not present

## 2021-05-11 DIAGNOSIS — Z79899 Other long term (current) drug therapy: Secondary | ICD-10-CM

## 2021-05-11 DIAGNOSIS — G4733 Obstructive sleep apnea (adult) (pediatric): Secondary | ICD-10-CM | POA: Diagnosis not present

## 2021-05-11 DIAGNOSIS — N2581 Secondary hyperparathyroidism of renal origin: Secondary | ICD-10-CM | POA: Diagnosis not present

## 2021-05-11 HISTORY — DX: Gastrointestinal hemorrhage, unspecified: K92.2

## 2021-05-11 HISTORY — DX: Melena: K92.1

## 2021-05-11 LAB — COMPREHENSIVE METABOLIC PANEL
ALT: 18 U/L (ref 0–44)
AST: 23 U/L (ref 15–41)
Albumin: 1.8 g/dL — ABNORMAL LOW (ref 3.5–5.0)
Alkaline Phosphatase: 31 U/L — ABNORMAL LOW (ref 38–126)
Anion gap: 13 (ref 5–15)
BUN: 107 mg/dL — ABNORMAL HIGH (ref 8–23)
CO2: 23 mmol/L (ref 22–32)
Calcium: 6.8 mg/dL — ABNORMAL LOW (ref 8.9–10.3)
Chloride: 100 mmol/L (ref 98–111)
Creatinine, Ser: 3.75 mg/dL — ABNORMAL HIGH (ref 0.61–1.24)
GFR, Estimated: 16 mL/min — ABNORMAL LOW (ref >60)
Glucose, Bld: 138 mg/dL — ABNORMAL HIGH (ref 70–99)
Potassium: 3.9 mmol/L (ref 3.5–5.1)
Sodium: 136 mmol/L (ref 135–145)
Total Bilirubin: 0.5 mg/dL (ref 0.3–1.2)
Total Protein: 3.7 g/dL — ABNORMAL LOW (ref 6.5–8.1)

## 2021-05-11 LAB — BASIC METABOLIC PANEL
Anion gap: 12 (ref 5–15)
BUN: 140 mg/dL — ABNORMAL HIGH (ref 8–23)
CO2: 20 mmol/L — ABNORMAL LOW (ref 22–32)
Calcium: 7 mg/dL — ABNORMAL LOW (ref 8.9–10.3)
Chloride: 103 mmol/L (ref 98–111)
Creatinine, Ser: 4.57 mg/dL — ABNORMAL HIGH (ref 0.61–1.24)
GFR, Estimated: 13 mL/min — ABNORMAL LOW (ref >60)
Glucose, Bld: 113 mg/dL — ABNORMAL HIGH (ref 70–99)
Potassium: 4.3 mmol/L (ref 3.5–5.1)
Sodium: 135 mmol/L (ref 135–145)

## 2021-05-11 LAB — CBC WITH DIFFERENTIAL/PLATELET
Abs Immature Granulocytes: 0.32 10*3/uL — ABNORMAL HIGH (ref 0.00–0.07)
Basophils Absolute: 0 10*3/uL (ref 0.0–0.1)
Basophils Relative: 0 %
Eosinophils Absolute: 0 10*3/uL (ref 0.0–0.5)
Eosinophils Relative: 0 %
HCT: 13.1 % — CL (ref 39.0–52.0)
Hemoglobin: 4.1 g/dL — CL (ref 13.0–17.0)
Immature Granulocytes: 2 %
Lymphocytes Relative: 15 %
Lymphs Abs: 2.7 10*3/uL (ref 0.7–4.0)
MCH: 28.9 pg (ref 26.0–34.0)
MCHC: 31.3 g/dL (ref 30.0–36.0)
MCV: 92.3 fL (ref 80.0–100.0)
Monocytes Absolute: 1 10*3/uL (ref 0.1–1.0)
Monocytes Relative: 6 %
Neutro Abs: 13.7 10*3/uL — ABNORMAL HIGH (ref 1.7–7.7)
Neutrophils Relative %: 77 %
Platelets: 149 10*3/uL — ABNORMAL LOW (ref 150–400)
RBC: 1.42 MIL/uL — ABNORMAL LOW (ref 4.22–5.81)
RDW: 14.9 % (ref 11.5–15.5)
WBC: 17.7 10*3/uL — ABNORMAL HIGH (ref 4.0–10.5)
nRBC: 0 % (ref 0.0–0.2)

## 2021-05-11 LAB — HEMOGLOBIN AND HEMATOCRIT, BLOOD
HCT: 18.7 % — ABNORMAL LOW (ref 39.0–52.0)
HCT: 21.8 % — ABNORMAL LOW (ref 39.0–52.0)
Hemoglobin: 6.3 g/dL — ABNORMAL LOW (ref 13.0–17.0)
Hemoglobin: 7.5 g/dL — ABNORMAL LOW (ref 13.0–17.0)

## 2021-05-11 LAB — LACTIC ACID, PLASMA
Lactic Acid, Venous: 5 mmol/L (ref 0.5–1.9)
Lactic Acid, Venous: 3 mmol/L (ref 0.5–1.9)
Lactic Acid, Venous: 1.9 mmol/L (ref 0.5–1.9)

## 2021-05-11 LAB — PROCALCITONIN: Procalcitonin: 0.56 ng/mL

## 2021-05-11 LAB — GLUCOSE, CAPILLARY: Glucose-Capillary: 95 mg/dL (ref 70–99)

## 2021-05-11 LAB — TROPONIN I (HIGH SENSITIVITY)
Troponin I (High Sensitivity): 63 ng/L — ABNORMAL HIGH (ref <18)
Troponin I (High Sensitivity): 196 ng/L (ref <18)
Troponin I (High Sensitivity): 347 ng/L (ref <18)

## 2021-05-11 LAB — PROTIME-INR
INR: 1.8 — ABNORMAL HIGH (ref 0.8–1.2)
Prothrombin Time: 21 seconds — ABNORMAL HIGH (ref 11.4–15.2)

## 2021-05-11 LAB — ABO/RH: ABO/RH(D): A NEG

## 2021-05-11 LAB — MAGNESIUM
Magnesium: 1.7 mg/dL (ref 1.7–2.4)
Magnesium: 1.8 mg/dL (ref 1.7–2.4)

## 2021-05-11 LAB — APTT: aPTT: 26 seconds (ref 24–36)

## 2021-05-11 LAB — PREPARE RBC (CROSSMATCH)

## 2021-05-11 LAB — MRSA NEXT GEN BY PCR, NASAL: MRSA by PCR Next Gen: NOT DETECTED

## 2021-05-11 MED ORDER — FENTANYL CITRATE PF 50 MCG/ML IJ SOSY
25.0000 ug | PREFILLED_SYRINGE | Freq: Once | INTRAMUSCULAR | Status: AC
  Administered 2021-05-11: 25 ug via INTRAVENOUS

## 2021-05-11 MED ORDER — VANCOMYCIN HCL 2000 MG/400ML IV SOLN
2000.0000 mg | Freq: Once | INTRAVENOUS | Status: DC
  Filled 2021-05-11: qty 400

## 2021-05-11 MED ORDER — SODIUM CHLORIDE 0.9% IV SOLUTION: Freq: Once | INTRAVENOUS | Status: AC

## 2021-05-11 MED ORDER — CHLORHEXIDINE GLUCONATE CLOTH 2 % EX PADS
6.0000 | MEDICATED_PAD | Freq: Every day | CUTANEOUS | Status: DC
  Administered 2021-05-12 – 2021-05-18 (×7): 6 via TOPICAL

## 2021-05-11 MED ORDER — PANTOPRAZOLE INFUSION (NEW) - SIMPLE MED
8.0000 mg/h | INTRAVENOUS | Status: AC
  Administered 2021-05-11 – 2021-05-13 (×7): 8 mg/h via INTRAVENOUS
  Filled 2021-05-11 (×7): qty 100

## 2021-05-11 MED ORDER — POLYETHYLENE GLYCOL 3350 17 G PO PACK: 17.0000 g | PACK | Freq: Every day | ORAL | Status: DC | PRN

## 2021-05-11 MED ORDER — NOREPINEPHRINE 16 MG/250ML-% IV SOLN
0.0000 ug/min | INTRAVENOUS | Status: DC
  Filled 2021-05-11: qty 250

## 2021-05-11 MED ORDER — PROTHROMBIN COMPLEX CONC HUMAN 500 UNITS IV KIT
4500.0000 [IU] | PACK | Status: AC
  Administered 2021-05-11: 4500 [IU] via INTRAVENOUS
  Filled 2021-05-11: qty 4000

## 2021-05-11 MED ORDER — METRONIDAZOLE 500 MG/100ML IV SOLN
500.0000 mg | Freq: Two times a day (BID) | INTRAVENOUS | Status: DC
  Administered 2021-05-11 – 2021-05-15 (×10): 500 mg via INTRAVENOUS
  Filled 2021-05-11 (×11): qty 100

## 2021-05-11 MED ORDER — LACTATED RINGERS IV BOLUS: 250.0000 mL | Freq: Once | INTRAVENOUS | Status: DC

## 2021-05-11 MED ORDER — METOPROLOL TARTRATE 5 MG/5ML IV SOLN
2.5000 mg | INTRAVENOUS | Status: DC | PRN
  Administered 2021-05-11 – 2021-05-16 (×2): 2.5 mg via INTRAVENOUS
  Filled 2021-05-11 (×2): qty 5

## 2021-05-11 MED ORDER — MIDAZOLAM HCL 2 MG/2ML IJ SOLN
0.5000 mg | Freq: Once | INTRAMUSCULAR | Status: AC
  Administered 2021-05-11: 0.5 mg via INTRAVENOUS
  Filled 2021-05-11: qty 2

## 2021-05-11 MED ORDER — SODIUM CHLORIDE 0.9% IV SOLUTION
Freq: Once | INTRAVENOUS | Status: DC
  Filled 2021-05-11: qty 250

## 2021-05-11 MED ORDER — SODIUM CHLORIDE 0.9 % IV SOLN
1.0000 g | INTRAVENOUS | Status: DC
  Administered 2021-05-11 – 2021-05-13 (×3): 1 g via INTRAVENOUS
  Filled 2021-05-11: qty 10
  Filled 2021-05-11 (×2): qty 1

## 2021-05-11 MED ORDER — NOREPINEPHRINE 4 MG/250ML-% IV SOLN
0.0000 ug/min | INTRAVENOUS | Status: DC
  Administered 2021-05-11: 5 ug/min via INTRAVENOUS
  Filled 2021-05-11: qty 250

## 2021-05-11 MED ORDER — DOCUSATE SODIUM 100 MG PO CAPS: 100.0000 mg | ORAL_CAPSULE | Freq: Two times a day (BID) | ORAL | Status: DC | PRN

## 2021-05-11 MED ORDER — SODIUM CHLORIDE 0.9 % IV SOLN
10.0000 mL/h | Freq: Once | INTRAVENOUS | Status: AC
  Administered 2021-05-11: 10 mL/h via INTRAVENOUS

## 2021-05-11 MED ORDER — HYDROCORTISONE SOD SUC (PF) 100 MG IJ SOLR
100.0000 mg | Freq: Three times a day (TID) | INTRAMUSCULAR | Status: DC
  Administered 2021-05-11 – 2021-05-14 (×9): 100 mg via INTRAVENOUS
  Filled 2021-05-11 (×9): qty 2

## 2021-05-11 MED ORDER — PANTOPRAZOLE 80MG IVPB - SIMPLE MED
80.0000 mg | Freq: Once | INTRAVENOUS | Status: AC
  Administered 2021-05-11: 80 mg via INTRAVENOUS
  Filled 2021-05-11: qty 100

## 2021-05-11 MED ORDER — MAGNESIUM SULFATE IN D5W 1-5 GM/100ML-% IV SOLN
1.0000 g | Freq: Once | INTRAVENOUS | Status: AC
  Administered 2021-05-11: 1 g via INTRAVENOUS
  Filled 2021-05-11: qty 100

## 2021-05-11 MED ORDER — PANTOPRAZOLE SODIUM 40 MG IV SOLR
40.0000 mg | Freq: Two times a day (BID) | INTRAVENOUS | Status: AC
  Administered 2021-05-14 – 2021-05-15 (×3): 40 mg via INTRAVENOUS
  Filled 2021-05-11 (×3): qty 10

## 2021-05-11 MED ORDER — FENTANYL CITRATE PF 50 MCG/ML IJ SOSY
25.0000 ug | PREFILLED_SYRINGE | Freq: Once | INTRAMUSCULAR | Status: AC
  Administered 2021-05-11: 25 ug via INTRAVENOUS
  Filled 2021-05-11: qty 1

## 2021-05-11 MED ORDER — VANCOMYCIN HCL 1500 MG/300ML IV SOLN
1500.0000 mg | Freq: Once | INTRAVENOUS | Status: AC
  Administered 2021-05-11: 1500 mg via INTRAVENOUS
  Filled 2021-05-11: qty 300

## 2021-05-11 MED ORDER — VANCOMYCIN HCL IN DEXTROSE 1-5 GM/200ML-% IV SOLN: 1000.0000 mg | INTRAVENOUS | Status: DC

## 2021-05-11 NOTE — ED Triage Notes (Signed)
Pt to ED via [facility] from home c/o black tarry stools for last 2 mornings, states SOB with exertion, dizzy, denies new pain but states pain over right chest dialysis port.  Pt pale, reports being on blood thinner, dialysis MWF.  Hypotensive with EMS, 70/50, given 500cc bolus fluids up to 104/68.  Hx afib with rate 70-140. ?

## 2021-05-11 NOTE — Progress Notes (Addendum)
Pharmacy Antibiotic Note ? ?Blake West is a 71 y.o. male admitted on 05/11/2021 with shock due to GI bleed and concern for sepsis. Received rituximab first dose 04/30/2021 for ANCA glomerulonephritis. PMH includes ESRD on HD, HFrEF EF 40-45%, atrial fibrillation. Pharmacy has been consulted for vancomycin and cefepime dosing. ? ?WBC elevated. Next hemodialysis session 4/19 (MWF schedule). ? ?Plan: ?Vancomycin 1500 mg loading dose ?Maintenance dose 1,000 mg post-HD. ?Weight > 80 kg ?Weekly level. Target 15-25 mcg/mL.  ? ?Follow up future hemodialysis plans, clinical improvement, and length of therapy. ? ?Height: 5\' 11"  (180.3 cm) ?Weight: 86.2 kg (190 lb) ?IBW/kg (Calculated) : 75.3 ? ?Temp (24hrs), Avg:97.9 ?F (36.6 ?C), Min:97.6 ?F (36.4 ?C), Max:98 ?F (36.7 ?C) ? ?Recent Labs  ?Lab 05/11/21 ?3838  ?WBC 17.7*  ?CREATININE 3.75*  ?LATICACIDVEN 5.0*  ?  ?Estimated Creatinine Clearance: 19.2 mL/min (A) (by C-G formula based on SCr of 3.75 mg/dL (H)).   ? ?Allergies  ?Allergen Reactions  ? Codeine   ?  Joints hurt   ? ? ?Antimicrobials this admission: ?4/18 vancomycin >>  ?4/18 cefepime >>  ? ?Dose adjustments this admission: ?N/a ? ?Microbiology results: ?4/18 BCx: to be collected ? ?Thank you for allowing pharmacy to be a part of this patient?s care. ? ?Wynelle Cleveland, PharmD ?Pharmacy Resident  ?05/11/2021 ?9:52 AM ?

## 2021-05-11 NOTE — Progress Notes (Signed)
?[facility]  ?ROUNDING NOTE  ? ?Subjective:  ? 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   ? ? Patient has been admitted for Hemorrhagic shock ([place]) [R57.8] ?GIB (gastrointestinal bleeding) [K92.2] ?Upper GI bleed [K92.2] ? ? ?Update: ? States he has had 2 days of black stools ?Got so weak that could not take even two steps ?Came to ER for evaluation ?Found to have severely low Hgb ?Getting blood transfusion at the moment ? ? ?Objective:  ?Vital signs in last 24 hours:  ?Temp:  [97.6 ?F (36.4 ?C)-98 ?F (36.7 ?C)] 98 ?F (36.7 ?C) (04/18 2947) ?Pulse Rate:  [46-130] 53 (04/18 0915) ?Resp:  [14-23] 20 (04/18 0915) ?BP: (75-109)/(47-81) 102/81 (04/18 0915) ?SpO2:  [91 %-100 %] 99 % (04/18 0915) ?Weight:  [86.2 kg] 86.2 kg (04/18 0508) ? ?Weight change:  ?Filed Weights  ? 05/11/21 0508  ?Weight: 86.2 kg  ? ? ?Intake/Output: ?I/O last 3 completed shifts: ?In: 370 [I.V.:10; Blood:360] ?Out: -  ?  ?Intake/Output this shift: ? Total I/O ?In: 279.9 [I.V.:4; IV Piggyback:275.8] ?Out: -  ? ?Physical Exam: ?General: NAD  ?Head: Normocephalic, atraumatic.  Dry oral mucosal membranes  ?Eyes: Anicteric  ?Lungs:  Clear to auscultation, normal effort  ?Heart: Irregular rate and rhythm  ?Abdomen:  Soft, nontender, nondistended  ?Extremities: + peripheral edema.  ?Neurologic: Alert, oriented    ?Skin: Warm, dry  ?Access: Rt permcath placed on 04/22/21 by Dr Lucky Cowboy  ? ? ?Basic Metabolic Panel: ?Recent Labs  ?Lab 05/11/21 ?6546  ?NA 136  ?K 3.9  ?CL 100  ?CO2 23  ?GLUCOSE 138*  ?BUN 107*  ?CREATININE 3.75*  ?CALCIUM 6.8*  ?MG 1.7  ? ? ? ?Liver Function Tests: ?Recent Labs  ?Lab 05/11/21 ?5035  ?AST 23  ?ALT 18  ?ALKPHOS 31*  ?BILITOT 0.5  ?PROT 3.7*  ?ALBUMIN 1.8*  ? ? ?No results for input(s): LIPASE, AMYLASE in the last 168  hours. ? ?No results for input(s): AMMONIA in the last 168 hours. ? ?CBC: ?Recent Labs  ?Lab 05/11/21 ?4656  ?WBC 17.7*  ?NEUTROABS 13.7*  ?HGB 4.1*  ?HCT 13.1*  ?MCV 92.3  ?PLT 149*  ? ? ? ?Cardiac Enzymes: ?No results for input(s): CKTOTAL, CKMB, CKMBINDEX, TROPONINI in the last 168 hours. ? ?BNP: ?Invalid input(s): POCBNP ? ?CBG: ?Recent Labs  ?Lab 05/11/21 ?0820  ?GLUCAP 95  ? ? ? ?Microbiology: ?Results for orders placed or performed during the hospital encounter of 04/19/21  ?Resp Panel by RT-PCR (Flu A&B, Covid) Nasopharyngeal Swab     Status: None  ? Collection Time: 04/19/21  9:14 PM  ? Specimen: Nasopharyngeal Swab; Nasopharyngeal(NP) swabs in vial transport medium  ?Result Value Ref Range Status  ? SARS Coronavirus 2 by RT PCR NEGATIVE NEGATIVE Final  ?  Comment: (NOTE) ?SARS-CoV-2 target nucleic acids are NOT DETECTED. ? ?The SARS-CoV-2 RNA is generally detectable in upper respiratory ?specimens during the acute phase of infection. The lowest ?concentration of SARS-CoV-2 viral copies this assay can detect is ?138 copies/mL. A negative result does not preclude SARS-Cov-2 ?infection and should not be used as the sole basis for treatment or ?other patient management decisions. A negative result may occur with  ?improper specimen collection/handling, submission of specimen other ?than nasopharyngeal swab, presence of viral mutation(s) within the ?areas targeted by this assay, and inadequate number of viral ?copies(<138 copies/mL).  A negative result must be combined with ?clinical observations, patient history, and epidemiological ?information. The expected result is Negative. ? ?Fact Sheet for Patients:  ?EntrepreneurPulse.com.au ? ?Fact Sheet for Healthcare Providers:  ?IncredibleEmployment.be ? ?This test is no t yet approved or cleared by the [place] FDA and  ?has been authorized for detection and/or diagnosis of SARS-CoV-2 by ?FDA under an Emergency Use  Authorization (EUA). This EUA will remain  ?in effect (meaning this test can be used) for the duration of the ?COVID-19 declaration under Section 564(b)(1) of the Act, 21 ?U.S.C.section 360bbb-3(b)(1), unless the authorization is terminated  ?or revoked sooner.  ? ? ?  ? Influenza A by PCR NEGATIVE NEGATIVE Final  ? Influenza B by PCR NEGATIVE NEGATIVE Final  ?  Comment: (NOTE) ?The Xpert Xpress SARS-CoV-2/FLU/RSV plus assay is intended as an aid ?in the diagnosis of influenza from Nasopharyngeal swab specimens and ?should not be used as a sole basis for treatment. Nasal washings and ?aspirates are unacceptable for Xpert Xpress SARS-CoV-2/FLU/RSV ?testing. ? ?Fact Sheet for Patients: ?EntrepreneurPulse.com.au ? ?Fact Sheet for Healthcare Providers: ?IncredibleEmployment.be ? ?This test is not yet approved or cleared by the [place] FDA and ?has been authorized for detection and/or diagnosis of SARS-CoV-2 by ?FDA under an Emergency Use Authorization (EUA). This EUA will remain ?in effect (meaning this test can be used) for the duration of the ?COVID-19 declaration under Section 564(b)(1) of the Act, 21 U.S.C. ?section 360bbb-3(b)(1), unless the authorization is terminated or ?revoked. ? ?Performed at [facility], [place], ?[place] [address] ?  ?Culture, blood (routine x 2)     Status: None  ? Collection Time: 04/19/21  9:45 PM  ? Specimen: BLOOD  ?Result Value Ref Range Status  ? Specimen Description BLOOD RIGHT ANTECUBITAL  Final  ? Special Requests   Final  ?  BOTTLES DRAWN AEROBIC AND ANAEROBIC Blood Culture adequate volume  ? Culture   Final  ?  NO GROWTH 5 DAYS ?Performed at [facility], [address] [place], [place] [address] ?  ? Report Status 04/24/2021 FINAL  Final  ?Culture, blood (routine x 2)     Status: None  ? Collection Time: 04/19/21 10:32 PM  ? Specimen: BLOOD  ?Result Value Ref Range Status  ? Specimen Description BLOOD  LEFT ASSIST CONTROL  Final  ? Special Requests   Final  ?  BOTTLES DRAWN AEROBIC AND ANAEROBIC Blood Culture adequate volume  ? Culture   Final  ?  NO GROWTH 5 DAYS ?Performed at [facility], [address] [place], [place] [address] ?  ? Report Status 04/24/2021 FINAL  Final  ?MRSA Next Gen by PCR, Nasal     Status: None  ? Collection Time: 04/20/21  2:21 AM  ? Specimen: Nasal Mucosa; Nasal Swab  ?Result Value Ref Range Status  ? MRSA by PCR Next Gen NOT DETECTED NOT DETECTED Final  ?  Comment: (NOTE) ?The GeneXpert MRSA Assay (FDA approved for NASAL specimens only), ?is one component of a comprehensive MRSA colonization surveillance ?program. It is not intended to diagnose MRSA infection nor to guide ?or monitor treatment for MRSA infections. ?Test performance is not FDA approved in patients less than 2 years ?old. ?Performed at [facility], [place], ?[place] [address] ?  ?Urine Culture     Status: None  ? Collection Time: 04/20/21  5:45 AM  ? Specimen: Urine, Clean Catch  ?Result Value Ref Range Status  ?  Specimen Description   Final  ?  URINE, CLEAN CATCH ?Performed at [facility], [address], [place], [place] [address] ?  ? Special Requests   Final  ?  NONE ?Performed at [facility], [address], [place], [place] [address] ?  ? Culture   Final  ?  NO GROWTH ?Performed at [facility], [place] [address] [place], [place] [address] ?  ? Report Status 04/21/2021 FINAL  Final  ?Aspergillus Ag, BAL/Serum     Status: None  ? Collection Time: 04/22/21  4:14 AM  ? Specimen: Vein  ?Result Value Ref Range Status  ? Aspergillus Ag, BAL/Serum 0.03 0.00 - 0.49 Index Final  ?  Comment: (NOTE) ?Performed At: [facility] ?[address] [place], [place] [id number] ?Rush Farmer MD VN:[id number] ?  ? ? ?Coagulation Studies: ?Recent Labs  ?  05/11/21 ?7793  ?LABPROT 21.0*  ?INR 1.8*  ? ? ? ?Urinalysis: ?No results for input(s): COLORURINE, LABSPEC,  [place], GLUCOSEU, HGBUR, BILIRUBINUR, KETONESUR, PROTEINUR, UROBILINOGEN, NITRITE, LEUKOCYTESUR in the last 72 hours. ? ?Invalid input(s): APPERANCEUR ?  ? ? ?Imaging: ?No results found. ? ? ?Medications:  ? ? lac

## 2021-05-11 NOTE — Consult Note (Signed)
?  ?Jonathon Bellows , MD ?[address], [place], [place], [place], [address] ?[address], [place], [place], [place], [address] ?Phone: [phone]  ?Fax: [phone] ? Consultation ? ?Referring Provider:     ICU ?Primary Care Physician:  Leonel Ramsay, MD ?Primary Gastroenterologist:  None          ?Reason for Consultation:     GI bleed ? ?Date of Admission:  05/11/2021 ?Date of Consultation:  05/11/2021 ?       ? HPI:   ?Blake West is a 71 y.o. male admitted after presenting to the emergency room with tarry black stool.  Prior history of CKD, glomerulonephritis.  He was hypotensive and started on norepinephrine ? ?He states that he has been having black tarry stools for the past 2 days.  Last episode was this morning.  Denies any hematemesis.  Denies any NSAID use.  Last dose of Eliquis was taken on 05/10/2021.  Has crampy abdominal discomfort just prior to bowel movement.  No other complaints.  When he tries to sit up feels dizzy. ? ?He is on dialysis and Eliquis for atrial fibrillation.  No prior history of GI bleed in the ER he was hypotensive and tachycardic found to have a hemoglobin of 4.1 g with a platelet 149 elevated BUN of 107 creatinine of 3.75.  INR of 1.8.Given Kcentra. ? ? ? ?Past Medical History:  ?Diagnosis Date  ? Abnormal EKG   ? Biventricular failure ([place])   ? CAD (coronary artery disease)   ? Colorectal polyps   ? Gout   ? Hyperlipidemia LDL goal <70   ? Hypertension   ? Hypothyroidism   ? Kidney stone   ? Pneumonia   ? Testicular hypofunction   ? Type 2 diabetes mellitus ([place]) 04/26/2019  ? ? ?Past Surgical History:  ?Procedure Laterality Date  ? CARDIAC CATHETERIZATION    ? DIALYSIS/PERMA CATHETER INSERTION N/A 04/22/2021  ? Procedure: DIALYSIS/PERMA CATHETER INSERTION;  Surgeon: Algernon Huxley, MD;  Location: [facility];  Service: Cardiovascular;  Laterality: N/A;  ? FOOT SURGERY    ? RIGHT/LEFT HEART CATH AND CORONARY ANGIOGRAPHY N/A 04/30/2019  ? Procedure: RIGHT/LEFT HEART CATH  AND CORONARY ANGIOGRAPHY;  Surgeon: Troy Sine, MD;  Location: [facility];  Service: Cardiovascular;  Laterality: N/A;  ? ? ?Prior to Admission medications   ?Medication Sig Start Date End Date Taking? Authorizing Provider  ?acidophilus (RISAQUAD) CAPS capsule Take 2 capsules by mouth 3 (three) times daily. 04/30/21   Annita Brod, MD  ?apixaban (ELIQUIS) 5 MG TABS tablet Take 1 tablet (5 mg total) by mouth 2 (two) times daily. 04/30/21   Annita Brod, MD  ?Ascorbic Acid (VITAMIN C) 100 MG tablet Take 100 mg by mouth daily.    [provider]  ?calcium-vitamin D (OSCAL WITH D) 500-5 MG-MCG tablet Take 1 tablet by mouth.    [provider]  ?levothyroxine (SYNTHROID) 25 MCG tablet Take 25 mcg by mouth every morning. 01/14/21   [provider]  ?magnesium 30 MG tablet Take 30 mg by mouth 2 (two) times daily.    [provider]  ?metoprolol succinate (TOPROL-XL) 50 MG 24 hr tablet Take 3 tablets (150 mg total) by mouth daily. 05/01/21   Annita Brod, MD  ?omega-3 acid ethyl esters (LOVAZA) 1 g capsule Take by mouth 2 (two) times daily.    [provider]  ?predniSONE (DELTASONE) 20 MG tablet Take 3 tablets (60  mg total) by mouth daily with breakfast. 05/01/21   Annita Brod, MD  ?QUEtiapine (SEROQUEL) 100 MG tablet Take 1 tablet (100 mg total) by mouth at bedtime. 04/30/21   Annita Brod, MD  ?sulfamethoxazole-trimethoprim (BACTRIM DS) 800-160 MG tablet Take 1 tablet by mouth 3 (three) times a week. 05/03/21   Annita Brod, MD  ?thiamine 250 MG tablet Take 500 mg by mouth daily.    [provider]  ? ? ?Family History  ?Problem Relation Age of Onset  ? Heart attack Mother 51  ? Pulmonary fibrosis Father   ? Congestive Heart Failure Brother   ? Heart attack Brother   ? Heart attack Brother   ? Early death Sister   ? Kidney disease Paternal Grandfather   ? Diabetes Sister   ?  ? ?Social History  ? ?Tobacco Use  ? Smoking status:  Former  ?  Packs/day: 1.00  ?  Years: 30.00  ?  Pack years: 30.00  ?  Types: Cigarettes  ?  Quit date: 01/24/2002  ?  Years since quitting: 19.3  ? Smokeless tobacco: Never  ?Vaping Use  ? Vaping Use: Never used  ?Substance Use Topics  ? Alcohol use: No  ? Drug use: No  ? ? ?Allergies as of 05/11/2021 - Review Complete 05/11/2021  ?Allergen Reaction Noted  ? Codeine  07/12/2013  ? ? ?Review of Systems:    ?All systems reviewed and negative except where noted in HPI. ? ? Physical Exam:  ?Vital signs in last 24 hours: ?Temp:  [97.6 ?F (36.4 ?C)-98 ?F (36.7 ?C)] 97.6 ?F (36.4 ?C) (04/18 0715) ?Pulse Rate:  [46-130] 66 (04/18 0715) ?Resp:  [17-21] 19 (04/18 0715) ?BP: (75-91)/(47-69) 91/69 (04/18 0715) ?SpO2:  [91 %-100 %] 100 % (04/18 0715) ?Weight:  [86.2 kg] 86.2 kg (04/18 0508) ?  ?General:   Pleasant, cooperative in NAD ?Head:  Normocephalic and atraumatic. ?Eyes:   No icterus.   Conjunctiva pink. PERRLA. ?Ears:  Normal auditory acuity. ?Neck:  Supple; no masses or thyroidomegaly ?Lungs: Respirations even and unlabored. Lungs clear to auscultation bilaterally.   No wheezes, crackles, or rhonchi.  ?Heart: Irregularly irregular without murmur, clicks, rubs or gallops ?Abdomen:  Soft, nondistended, nontender. Normal bowel sounds. No appreciable masses or hepatomegaly.  No rebound or guarding.  ?Neurologic:  Alert and oriented x3;  grossly normal neurologically. ?Psych:  Alert and cooperative. Normal affect. ? ?LAB RESULTS: ?Recent Labs  ?  05/11/21 ?2774  ?WBC 17.7*  ?HGB 4.1*  ?HCT 13.1*  ?PLT 149*  ? ?BMET ?Recent Labs  ?  05/11/21 ?1287  ?NA 136  ?K 3.9  ?CL 100  ?CO2 23  ?GLUCOSE 138*  ?BUN 107*  ?CREATININE 3.75*  ?CALCIUM 6.8*  ? ?LFT ?Recent Labs  ?  05/11/21 ?8676  ?PROT 3.7*  ?ALBUMIN 1.8*  ?AST 23  ?ALT 18  ?ALKPHOS 31*  ?BILITOT 0.5  ? ?PT/INR ?Recent Labs  ?  05/11/21 ?7209  ?LABPROT 21.0*  ?INR 1.8*  ? ? ?STUDIES: ?No results found. ? ? ? Impression / Plan:  ? ?Blake West is a 71 y.o. y/o male with with  history of CKD, atrial fibrillation on Eliquis presented to the emergency room with a 1 day history of melena hemoglobin was 4 g.  Elevated BUN/creatinine ratio.  Suspect severe upper GI bleed as he presented in shock in the ER. ? ? ?Plan ?1.  2 large-bore IV cannulas.  Monitor CBC and transfuse as needed. ?2.  IV PPI gtt. ?3.  Hold Eliquis after 2 days of holding Eliquis with plan for EGD which would be Thursday. ?4.  If having further episodes of bleeding and drop in hemoglobin consider tagged RBC scan ? ?I have discussed alternative options, risks & benefits,  which include, but are not limited to, bleeding, infection, perforation,respiratory complication & drug reaction.  The patient agrees with this plan & written consent will be obtained.    ? ?Thank you for involving me in the care of this patient.   ? ? ? LOS: 0 days  ? ?Jonathon Bellows, MD  05/11/2021, 8:11 AM ? ?   ?

## 2021-05-11 NOTE — ED Provider Notes (Signed)
? ?[facility] ?Provider Note ? ? ? Event Date/Time  ? First MD Initiated Contact with Patient 05/11/21 0457   ?  (approximate) ? ? ?History  ? ?Melena ? ? ?HPI ? ?Blake West is a 71 y.o. male with a history of glomerulonephritis, ESRD on HD, CHF with a EF of 40 to 45%, hypothyroidism, A-fib, hypertension, diabetes who presents for evaluation of melena.  Patient reports 1 episode of black tarry stools yesterday morning and one this morning.  Last dialysis was yesterday.  Patient is on Eliquis for A-fib.  He denies any other history of GI bleed, no history of cirrhosis of the liver.  He has been vomiting as well but denies coffee-ground emesis or hematemesis.  Denies abdominal pain.  Over the last several days has been feeling short of breath with exertion and dizzy.  Denies any chest pain. ?  ? ? ?Past Medical History:  ?Diagnosis Date  ? Abnormal EKG   ? Biventricular failure ([place])   ? CAD (coronary artery disease)   ? Colorectal polyps   ? Gout   ? Hyperlipidemia LDL goal <70   ? Hypertension   ? Hypothyroidism   ? Kidney stone   ? Pneumonia   ? Testicular hypofunction   ? Type 2 diabetes mellitus ([place]) 04/26/2019  ? ? ?Past Surgical History:  ?Procedure Laterality Date  ? CARDIAC CATHETERIZATION    ? DIALYSIS/PERMA CATHETER INSERTION N/A 04/22/2021  ? Procedure: DIALYSIS/PERMA CATHETER INSERTION;  Surgeon: Algernon Huxley, MD;  Location: [facility];  Service: Cardiovascular;  Laterality: N/A;  ? FOOT SURGERY    ? RIGHT/LEFT HEART CATH AND CORONARY ANGIOGRAPHY N/A 04/30/2019  ? Procedure: RIGHT/LEFT HEART CATH AND CORONARY ANGIOGRAPHY;  Surgeon: Troy Sine, MD;  Location: [facility];  Service: Cardiovascular;  Laterality: N/A;  ? ? ? ?Physical Exam  ? ?Triage Vital Signs: ?ED Triage Vitals  ?Enc Vitals Group  ?   BP 05/11/21 0530 (!) 80/56  ?   Pulse Rate 05/11/21 0510 (!) 130  ?   Resp 05/11/21 0510 17  ?   Temp 05/11/21 0510 98 ?F (36.7 ?C)  ?   Temp Source 05/11/21 0510  Oral  ?   SpO2 05/11/21 0540 91 %  ?   Weight 05/11/21 0508 190 lb (86.2 kg)  ?   Height 05/11/21 0508 5\' 11"  (1.803 m)  ?   Head Circumference --   ?   Peak Flow --   ?   Pain Score 05/11/21 0507 0  ?   Pain Loc --   ?   Pain Edu? --   ?   Excl. in [place]? --   ? ? ?Most recent vital signs: ?Vitals:  ? 05/11/21 0603 05/11/21 0615  ?BP: (!) 84/47 (!) 75/59  ?Pulse: (!) 57 (!) 59  ?Resp: 18 18  ?Temp: 97.9 ?F (36.6 ?C)   ?SpO2: 97% 97%  ? ? ? ?Constitutional: Alert and oriented, pale. ?HEENT: ?     Head: Normocephalic and atraumatic.    ?     Eyes: Conjunctivae are normal. Sclera is non-icteric.  ?     Mouth/Throat: Mucous membranes are moist.  ?     Neck: Supple with no signs of meningismus. ?Cardiovascular: Irregularly irregular rhythm with tachycardic rate ?Respiratory: Normal respiratory effort. Lungs are clear to auscultation bilaterally.  ?Gastrointestinal: Soft, non tender, and non distended with positive bowel sounds. No rebound or guarding. ?Genitourinary: No CVA tenderness. ?Musculoskeletal:  No edema, cyanosis, or erythema of extremities. ?Neurologic: Normal speech and language. Face is symmetric. Moving all extremities. No gross focal neurologic deficits are appreciated. ?Skin: Skin is warm, dry and intact. No rash noted. ?Psychiatric: Mood and affect are normal. Speech and behavior are normal. ? ?ED Results / Procedures / Treatments  ? ?Labs ?(all labs ordered are listed, but only abnormal results are displayed) ?Labs Reviewed  ?CBC WITH DIFFERENTIAL/PLATELET - Abnormal; Notable for the following components:  ?    Result Value  ? WBC 17.7 (*)   ? RBC 1.42 (*)   ? Hemoglobin 4.1 (*)   ? HCT 13.1 (*)   ? Platelets 149 (*)   ? Neutro Abs 13.7 (*)   ? Abs Immature Granulocytes 0.32 (*)   ? All other components within normal limits  ?COMPREHENSIVE METABOLIC PANEL - Abnormal; Notable for the following components:  ? Glucose, Bld 138 (*)   ? BUN 107 (*)   ? Creatinine, Ser 3.75 (*)   ? Calcium 6.8 (*)   ? Total  Protein 3.7 (*)   ? Albumin 1.8 (*)   ? Alkaline Phosphatase 31 (*)   ? GFR, Estimated 16 (*)   ? All other components within normal limits  ?PROTIME-INR - Abnormal; Notable for the following components:  ? Prothrombin Time 21.0 (*)   ? INR 1.8 (*)   ? All other components within normal limits  ?LACTIC ACID, PLASMA - Abnormal; Notable for the following components:  ? Lactic Acid, Venous 5.0 (*)   ? All other components within normal limits  ?APTT  ?LACTIC ACID, PLASMA  ?TYPE AND SCREEN  ?PREPARE RBC (CROSSMATCH)  ?TYPE AND SCREEN  ? ? ? ?EKG ? ?ED ECG REPORT ?I, Rudene Re, the attending physician, personally viewed and interpreted this ECG. ? ?A-fib with frequent PVCs, rate of 129, no ST elevations or depressions. ? ?RADIOLOGY ?none ? ? ?PROCEDURES: ? ?Critical Care performed: Yes, see critical care procedure note(s) ? ?.Critical Care ?Performed by: Rudene Re, MD ?Authorized by: Rudene Re, MD  ? ?Critical care provider statement:  ?  Critical care time (minutes):  60 ?  Critical care time was exclusive of:  Separately billable procedures and treating other patients ?  Critical care was necessary to treat or prevent imminent or life-threatening deterioration of the following conditions:  Shock, circulatory failure and cardiac failure ?  Critical care was time spent personally by me on the following activities:  Development of treatment plan with patient or surrogate, discussions with consultants, evaluation of patient's response to treatment, examination of patient, ordering and review of laboratory studies, ordering and review of radiographic studies, ordering and performing treatments and interventions, pulse oximetry, re-evaluation of patient's condition and review of old charts ?  I assumed direction of critical care for this patient from another provider in my specialty: no   ?  Care discussed with: admitting provider   ? ? ? ?IMPRESSION / MDM / ASSESSMENT AND PLAN / ED COURSE  ?I  reviewed the triage vital signs and the nursing notes. ? ?71 y.o. male with a history of glomerulonephritis, ESRD on HD, CHF with a EF of 40 to 45%, hypothyroidism, A-fib, hypertension, diabetes who presents for evaluation of melena x 2 episodes.  On arrival to the emergency room patient is extremely pale, hypotensive, in A-fib with RVR.  Rectal exam showing gross black stool guaiac positive ? ?Ddx: Upper GI bleed, acute blood loss anemia, hemorrhagic shock ? ? ?Plan: Since patient has  ESRD will give 1U pRBC and 250cc bolus for hypotension. Labs pending. ? ? ?MEDICATIONS GIVEN IN ED: ?Medications  ?lactated ringers bolus 250 mL (0 mLs Intravenous Hold 05/11/21 0605)  ?prothrombin complex conc human (KCENTRA) IVPB 4,500 Units (has no administration in time range)  ?pantoprazole (PROTONIX) 80 mg /NS 100 mL IVPB (has no administration in time range)  ?pantoprozole (PROTONIX) 80 mg /NS 100 mL infusion (has no administration in time range)  ?pantoprazole (PROTONIX) injection 40 mg (has no administration in time range)  ?0.9 %  sodium chloride infusion (Manually program via Guardrails IV Fluids) (0 mLs Intravenous Hold 05/11/21 0628)  ?norepinephrine (LEVOPHED) 4mg  in 274mL (0.016 mg/mL) premix infusion (has no administration in time range)  ?docusate sodium (COLACE) capsule 100 mg (has no administration in time range)  ?polyethylene glycol (MIRALAX / GLYCOLAX) packet 17 g (has no administration in time range)  ?0.9 %  sodium chloride infusion (0 mL/hr Intravenous Stopped 05/11/21 0618)  ? ? ? ?ED COURSE: Hemoglobin of 4.1 down from 10.2 just 11 days ago.  Platelets are stable.  Mildly elevated white count which is stable for patient as well.  INR of 1.8.  Patient was started on a unit of emergent release blood.  2 more units of type and cross have been ordered.  We will reverse his Eliquis with Kcentra.  We will continue to monitor patient closely due to hemodynamic instability.  We will consult GI and ICU service.  IV  Protonix bolus and drip.  Unable to get a CT with contrast to eval for active GI bleed since patient was recently started on dialysis for glomerulonephritis. Discussed with Dr. Marius Ditch from GI who recommended ICU ad

## 2021-05-11 NOTE — ED Notes (Signed)
Emergency release PRBC finished at this time.  ?

## 2021-05-11 NOTE — Progress Notes (Addendum)
PHARMACY CONSULT NOTE - FOLLOW UP ? ?Pharmacy Consult for Electrolyte Monitoring and Replacement  ? ?Recent Labs: ?Potassium (mmol/L)  ?Date Value  ?05/11/2021 3.9  ? ?Magnesium (mg/dL)  ?Date Value  ?05/11/2021 1.7  ? ?Calcium (mg/dL)  ?Date Value  ?05/11/2021 6.8 (L)  ? ?Albumin (g/dL)  ?Date Value  ?05/11/2021 1.8 (L)  ? ?Phosphorus (mg/dL)  ?Date Value  ?04/20/2021 13.2 (H)  ? ?Sodium (mmol/L)  ?Date Value  ?05/11/2021 136  ?06/26/2019 138  ?Corrected Calcium: 8.56 mg/dl ? ?Assessment: ?70 year old male presenting with melena. PMH includes ESRD on HD MWF, atrial fibrillation on Eliquis 5 mg BID (unknown last dose), glomerulonephritis, HFrEF EF 40-45%, hypothyroidism, hypertension, type 2 diabetes mellitus. Admitted with acute GI bleed.  ? ?Received KCentra 4500 units x 1 4/18 AM. Receiving blood product 4/18 AM. ? ?Goal of Therapy:  ?K 4-5 ?Mag > 2 ?All other electrolytes within normal limits ? ?Plan:  ?Magnesium slightly low. Give magnesium 1 gram x 1. ?Follow up electrolytes in the AM ? ?Wynelle Cleveland, PharmD ?Pharmacy Resident  ?05/11/2021 ?8:12 AM ? ? ?

## 2021-05-11 NOTE — Procedures (Signed)
Central Venous Catheter Insertion Procedure Note ?Dory Horn Oceguera ?[id number] ?1950/06/16 ? ?Procedure: Insertion of Central Venous Catheter ?Indications: Assessment of intravascular volume, Drug and/or fluid administration, and Frequent blood sampling ? ?Procedure Details ?Consent: Risks of procedure as well as the alternatives and risks of each were explained to the (patient/caregiver).  Consent for procedure obtained. ?Time Out: Verified patient identification, verified procedure, site/side was marked, verified correct patient position, special equipment/implants available, medications/allergies/relevent history reviewed, required imaging and test results available.  Performed ? ?Maximum sterile technique was used including antiseptics, cap, gloves, gown, hand hygiene, mask, and sheet. ?Skin prep: Chlorhexidine; local anesthetic administered ?A antimicrobial bonded/coated triple lumen catheter was placed in the left internal jugular vein using the Seldinger technique. ? ?Evaluation ?Blood flow good ?Complications: No apparent complications ?Patient did tolerate procedure well. ?Chest X-ray ordered to verify placement.  CXR: pending. ? ? ? ?Line secured at the 20 cm mark. ?BIOPATCH applied to the insertion site. ? ? ?Darel Hong, AGACNP-BC ?[facility] ?Prefer epic messenger for cross cover needs ?If after hours, please call E-link ? ? ?Bradly Bienenstock ?05/11/2021, 9:33 AM ?  ?

## 2021-05-11 NOTE — H&P (Signed)
? ?NAME:  Blake West, MRN:  [id number], DOB:  1950/09/11, LOS: 0 ?ADMISSION DATE:  05/11/2021, CONSULTATION DATE:  05/11/2021 ?REFERRING MD:  Dr. Alfred Levins, CHIEF COMPLAINT:  Melena  ? 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   ? ?History of Present Illness:  ?71 yo M presenting to [facility] ED from home via EMS with c/o black tarry stools for the past 2 days.  Most recent episode was earlier this morning. He also reports associated dyspnea and lightheadedness on exertion.  Also reported crampy abdominal discomfort just prior to bowel movements.  Patient is taking Eliquis for chronic Atrial fibrillation, also reports he takes aspirin occasionally for pain control.  He denies other history of GI bleeding or cirrhosis. He does admit to vomiting episodes, but denies coffee-ground emesis, hematemesis, or hematochezia. He denies abdominal pain/chest pain. Over the last several days he reports feeling short of breath with exertion and dizzy. ? ?Of note, patient was recently hospitalized at [facility] from 04/19/21 to 04/30/21 after being sent over from his nephrologist with lab work noting hyperkalemia and worsening renal function.  Patient found to have a creatinine of 15.  Chest x-ray also noted scattered nodular airspace disease concerning for pneumonia.  Patient admitted to the hospital service and started on antibiotics.  Nephrology consulted and work-up underway for vasculitis.  Renal function continued to decline and patient had dialysis catheter placed and started dialysis on 3/30.  Hospital course complicated by urinary retention which has since resolved and treatment of multifocal pneumonia. For ANCA-associated vasculitis, he received Solu-Medrol which was changed to p.o.  prednisone.  Started on rituximab with next dose planned in 10 days.  Seen by pulmonary and rheumatology.  On Bactrim 3 times a week for prophylaxis. ? ?ED Course: ?Per EDP report, rectal exam grossly positive with black stool that is guaiac positive upon testing. Patient pale, hypotensive in A-fib  ?Initial Vital Signs: 98, 20, 114, 84/47, 94% on RA ?Significant Labs: Glucose 138, BUN 107, creatinine 3.75, calcium 6.8, high-sensitivity troponin 63, lactic acid 5.0, WBC 17.7, hemoglobin 4.1, hematocrit 13.1, platelets 149, PT 21, INR 1.8 ?Imaging ?Chest X-ray>>IMPRESSION: ?There is interval placement of left IJ central venous catheter with ?its tip at the junction of left innominate vein and superior vena ?cava. There is improvement in aeration of right lower lung fields. ?Residual patchy infiltrates are seen in the left lower lung fields ?suggesting atelectasis/pneumonia. There are no signs of alveolar ?pulmonary edema. There is no pneumothorax. ?Medications Administered:  Protonix bolus started, 250 mL IVF bolus, Kcentra, 1 unit emergent PRBC's, levophed drip started ? ?PCCM asked to admit.  GI consulted. ? ?Pertinent  Medical History  ?HFrEF (LVEF 40-45%) ?ESRD on iHD ?ANCA-associated vasculitis ?Chronic Atrial Fibrillation on Eliquis ?HTN ?Hypothyroidism ?HLD ?CAD ?Gout ?T2DM ? ?Micro Data:  ?4/18: Blood culture x2 >> ? ?Antimicrobials:  ?Cefepime 4/18>> ?Flagyl 4/18>> ?Vancomycin 4/18>> ? ?Significant Hospital Events: ?Including procedures, antibiotic start and stop dates in addition to other pertinent events   ?4/18: Presented to ED with melena, hemorrhagic shock.  PCCM asked to admit.  Will place central line. GI consulted.  Ordered for 3 units of blood. ? ?Interim History / Subjective:  ?-Presented to ED earlier due to multiple episodes of melena with associated shortness of breath on  exertion and lightheadedness ?-Found to have acute blood loss anemia with hemorrhagic shock ?-Received Kcentra, IV Protonix  bolus and drip and 1 unit of emergent blood in the ED ?-Ordered for 2 additional units of PRBCs ?-GI has been consulted, will likely plan for EGD once patient more stable ?-We will place central line due to vasopressors, multiple infusions, frequent lab draws ~patient and his wife are in agreement for placement and give consent ? ?Objective   ?Blood pressure 102/81, pulse (!) 53, temperature 98 ?F (36.7 ?C), temperature source Oral, resp. rate 20, height 5\' 11"  (1.803 m), weight 86.2 kg, SpO2 99 %. ?   ?   ? ?Intake/Output Summary (Last 24 hours) at 05/11/2021 1108 ?Last data filed at 05/11/2021 0800 ?Gross per 24 hour  ?Intake 649.85 ml  ?Output --  ?Net 649.85 ml  ? ?Filed Weights  ? 05/11/21 0508  ?Weight: 86.2 kg  ? ? ?Examination: ?General: Acute on chronically ill-appearing male, laying in bed, on room air, no acute distress ?HENT: Atraumatic, normocephalic, neck supple, no JVD, dry mucous membranes ?Lungs: Clear to auscultation bilaterally, even, nonlabored, normal effort ?Cardiovascular: Tachycardia, irregular irregular rhythm, no murmurs, rubs, gallops ?Abdomen: Soft, nontender, nondistended, no guarding rebound tenderness, bowel sounds positive x4 ?Extremities: No deformities, 3+ edema bilateral lower extremities, no cyanosis or clubbing ?Neuro: Awake, alert and oriented x4, moves all extremities to command, no focal deficits, speech clear, pupils PERRLA ?GU: Deferred ? ?Resolved Hospital Problem list   ? ? ?Assessment & Plan:  ? ?Acute Upper Gastrointestinal Bleed in the setting of chronic systemic anticoagulation ?Acute blood loss anemia ?PMHx: A-fib on Eliquis ?Eliquis reversed with Kcentra in ED, Protonix bolus started in ED, 1 unit of uncross-matched blood started in ED ?- continue with additional 2 units of PRBC's ?- Protonix gtt ?- Maintain up to date type & screen ?- continuous cardiac monitoring ?- NPO ?- Monitor for s/s of bleeding ?- Daily CBC, PT/ INR monitoring PRN (follow H&H q6h) ?- Transfuse  for Hgb <8 ?-Continue to hold Eliquis ?- GI consulted, appreciate input ~ plan for EGD once stabilized and Eliquis on hold for 2 days (if further episodes of bleeding and drop in H&H, recommends tagged RBC scan) ?  ?Hemorrhagic Shock ?Mildly elevated Troponin, suspect demand ischemia ?Chronic Atrial Fibrillation on Eliquis ?Chronic HFrEF without acute exacerbation ?-Continuous cardiac monitoring ?-Maintain MAP >65 ?-Transfusions as indicated ?-Vasopressors as needed to maintain MAP goal ?-Discussed with Dr. Mortimer Fries, will start stress dose steroids given pt was previously on steroids for vasculitis ?-Trend lactic acid until normalized ?-Trend HS Troponin until peaked ?-Echocardiogram 04/28/21: LVEF 40-45%, indeterminate diastolic parameters, RV systolic function low normal ?-Unable to anticoagulate due to GI bleed ?-Consider Cardiology consult ? ?Leukocytosis ?Meets SIRS Criteria on admission (WBC 17.7, HR 114, Lactic 5.0) ?Recently treated for Multifocal Pneumonia 3/27-4/7, completed course of Azithromycin & Ceftriaxone, discharged home on Bactrim DS 3x per week ?-Monitor fever curve ?-Trend WBC's & Procalcitonin ?-Follow cultures as above ?-Discussed with Dr Mortimer Fries, will start empiric Cefepime, Flagyl, and Vancomycin pending cultures & sensitivities due to immunocompromised status s/p Rituximab on 04/30/21 ? ?ESRD on iHD secondary to glomerulonephritis ?-Monitor I&O's / urinary output ?-Follow BMP ?-Ensure adequate renal perfusion ?-Avoid nephrotoxic agents as able ?-Replace electrolytes as indicated ?-Consult Nephrology, appreciate input ~HD as per Nephrology ?  ? ?Best Practice (right click and "Reselect all SmartList Selections" daily)  ? ?Diet/type: NPO ?DVT prophylaxis: SCD ?GI prophylaxis: PPI ?Lines: Central line and yes and it is still needed ?  Foley:  N/A ?Code Status:  full code ?Last date of multidisciplinary goals of care discussion [05/11/21] ? ?Updated pt's wife and daughter at bedside 05/11/21.  All questions  answered to their satisfaction. ? ?Labs   ?CBC: ?Recent Labs  ?Lab 05/11/21 ?7639  ?WBC 17.7*  ?NEUTROABS 13.7*  ?HGB 4.1*  ?HCT 13.1*  ?MCV 92.3  ?PLT 149*  ? ? ?Basic Metabolic Panel: ?Recent Labs  ?Lab

## 2021-05-11 NOTE — ED Notes (Signed)
Family Member in room at this time and updated with care plan. ?

## 2021-05-11 NOTE — ED Notes (Signed)
Report given to Freddrick March, RN in ICU.  ?

## 2021-05-12 DIAGNOSIS — K922 Gastrointestinal hemorrhage, unspecified: Secondary | ICD-10-CM

## 2021-05-12 DIAGNOSIS — J189 Pneumonia, unspecified organism: Secondary | ICD-10-CM | POA: Diagnosis not present

## 2021-05-12 DIAGNOSIS — J9601 Acute respiratory failure with hypoxia: Secondary | ICD-10-CM | POA: Diagnosis not present

## 2021-05-12 DIAGNOSIS — I7782 Antineutrophilic cytoplasmic antibody (ANCA) vasculitis: Secondary | ICD-10-CM | POA: Diagnosis not present

## 2021-05-12 DIAGNOSIS — I132 Hypertensive heart and chronic kidney disease with heart failure and with stage 5 chronic kidney disease, or end stage renal disease: Secondary | ICD-10-CM | POA: Diagnosis not present

## 2021-05-12 DIAGNOSIS — I5022 Chronic systolic (congestive) heart failure: Secondary | ICD-10-CM | POA: Diagnosis not present

## 2021-05-12 DIAGNOSIS — I482 Chronic atrial fibrillation, unspecified: Secondary | ICD-10-CM | POA: Diagnosis not present

## 2021-05-12 DIAGNOSIS — N186 End stage renal disease: Secondary | ICD-10-CM | POA: Diagnosis not present

## 2021-05-12 LAB — CBC
HCT: 20.3 % — ABNORMAL LOW (ref 39.0–52.0)
Hemoglobin: 7 g/dL — ABNORMAL LOW (ref 13.0–17.0)
MCH: 29.9 pg (ref 26.0–34.0)
MCHC: 34.5 g/dL (ref 30.0–36.0)
MCV: 86.8 fL (ref 80.0–100.0)
Platelets: 103 10*3/uL — ABNORMAL LOW (ref 150–400)
RBC: 2.34 MIL/uL — ABNORMAL LOW (ref 4.22–5.81)
RDW: 15.1 % (ref 11.5–15.5)
WBC: 18.8 10*3/uL — ABNORMAL HIGH (ref 4.0–10.5)
nRBC: 0.3 % — ABNORMAL HIGH (ref 0.0–0.2)

## 2021-05-12 LAB — HEMOGLOBIN AND HEMATOCRIT, BLOOD
HCT: 20.5 % — ABNORMAL LOW (ref 39.0–52.0)
HCT: 19.9 % — ABNORMAL LOW (ref 39.0–52.0)
HCT: 24.4 % — ABNORMAL LOW (ref 39.0–52.0)
HCT: 22.3 % — ABNORMAL LOW (ref 39.0–52.0)
HCT: 20.9 % — ABNORMAL LOW (ref 39.0–52.0)
Hemoglobin: 7.1 g/dL — ABNORMAL LOW (ref 13.0–17.0)
Hemoglobin: 6.9 g/dL — ABNORMAL LOW (ref 13.0–17.0)
Hemoglobin: 8.6 g/dL — ABNORMAL LOW (ref 13.0–17.0)
Hemoglobin: 7.7 g/dL — ABNORMAL LOW (ref 13.0–17.0)
Hemoglobin: 7.4 g/dL — ABNORMAL LOW (ref 13.0–17.0)

## 2021-05-12 LAB — PROTIME-INR
INR: 1.3 — ABNORMAL HIGH (ref 0.8–1.2)
Prothrombin Time: 15.8 seconds — ABNORMAL HIGH (ref 11.4–15.2)

## 2021-05-12 LAB — COMPREHENSIVE METABOLIC PANEL
ALT: 35 U/L (ref 0–44)
AST: 26 U/L (ref 15–41)
Albumin: 2 g/dL — ABNORMAL LOW (ref 3.5–5.0)
Alkaline Phosphatase: 31 U/L — ABNORMAL LOW (ref 38–126)
Anion gap: 11 (ref 5–15)
BUN: 146 mg/dL — ABNORMAL HIGH (ref 8–23)
CO2: 20 mmol/L — ABNORMAL LOW (ref 22–32)
Calcium: 7.1 mg/dL — ABNORMAL LOW (ref 8.9–10.3)
Chloride: 103 mmol/L (ref 98–111)
Creatinine, Ser: 4.82 mg/dL — ABNORMAL HIGH (ref 0.61–1.24)
GFR, Estimated: 12 mL/min — ABNORMAL LOW (ref >60)
Glucose, Bld: 114 mg/dL — ABNORMAL HIGH (ref 70–99)
Potassium: 4.3 mmol/L (ref 3.5–5.1)
Sodium: 134 mmol/L — ABNORMAL LOW (ref 135–145)
Total Bilirubin: 1.2 mg/dL (ref 0.3–1.2)
Total Protein: 3.9 g/dL — ABNORMAL LOW (ref 6.5–8.1)

## 2021-05-12 LAB — TROPONIN I (HIGH SENSITIVITY): Troponin I (High Sensitivity): 294 ng/L (ref <18)

## 2021-05-12 LAB — MAGNESIUM: Magnesium: 1.9 mg/dL (ref 1.7–2.4)

## 2021-05-12 LAB — HEPATITIS B SURFACE ANTIGEN: Hepatitis B Surface Ag: NONREACTIVE

## 2021-05-12 LAB — PROCALCITONIN: Procalcitonin: 0.85 ng/mL

## 2021-05-12 LAB — PREPARE RBC (CROSSMATCH)

## 2021-05-12 LAB — PHOSPHORUS: Phosphorus: 5 mg/dL — ABNORMAL HIGH (ref 2.5–4.6)

## 2021-05-12 LAB — HEPATITIS B SURFACE ANTIBODY,QUALITATIVE: Hep B S Ab: NONREACTIVE

## 2021-05-12 MED ORDER — BOOST / RESOURCE BREEZE PO LIQD CUSTOM
1.0000 | Freq: Three times a day (TID) | ORAL | Status: DC
  Administered 2021-05-12 – 2021-05-13 (×3): 1 via ORAL

## 2021-05-12 MED ORDER — SODIUM CHLORIDE 0.9% IV SOLUTION: Freq: Once | INTRAVENOUS | Status: DC

## 2021-05-12 MED ORDER — AMIODARONE IV BOLUS ONLY 150 MG/100ML
150.0000 mg | Freq: Once | INTRAVENOUS | Status: AC
  Administered 2021-05-12: 150 mg via INTRAVENOUS
  Filled 2021-05-12: qty 100

## 2021-05-12 MED ORDER — NOREPINEPHRINE 16 MG/250ML-% IV SOLN
0.0000 ug/min | INTRAVENOUS | Status: DC
  Filled 2021-05-12: qty 250

## 2021-05-12 MED ORDER — MAGNESIUM SULFATE 2 GM/50ML IV SOLN
2.0000 g | Freq: Once | INTRAVENOUS | Status: AC
  Administered 2021-05-12: 2 g via INTRAVENOUS
  Filled 2021-05-12: qty 50

## 2021-05-12 NOTE — Progress Notes (Signed)
PHARMACY CONSULT NOTE - FOLLOW UP ? ?Pharmacy Consult for Electrolyte Monitoring and Replacement  ? ?Recent Labs: ?Potassium (mmol/L)  ?Date Value  ?05/12/2021 4.3  ? ?Magnesium (mg/dL)  ?Date Value  ?05/12/2021 1.9  ? ?Calcium (mg/dL)  ?Date Value  ?05/12/2021 7.1 (L)  ? ?Albumin (g/dL)  ?Date Value  ?05/12/2021 2.0 (L)  ? ?Phosphorus (mg/dL)  ?Date Value  ?05/12/2021 5.0 (H)  ? ?Sodium (mmol/L)  ?Date Value  ?05/12/2021 134 (L)  ?06/26/2019 138  ? ?Corrected Calcium: 8.7 mg/dl ? ?Assessment: ?71 year old male presenting with melena. PMH includes ESRD on HD MWF, atrial fibrillation on Eliquis 5 mg BID (unknown last dose), glomerulonephritis, HFrEF EF 40-45%, hypothyroidism, hypertension, type 2 diabetes mellitus. Admitted with acute GI bleed.  ? ?Received KCentra 4500 units x 1 4/18 AM. Receiving blood product 4/18 AM. EGD planned 4/20 ? ?Goal of Therapy:  ?K 4-5 ?Mag > 2 ?All other electrolytes within normal limits ? ?Plan:  ?Magnesium borderline, but still below goal after replacement 4/18. Give magnesium 2 grams x 1 ?Follow up electrolytes in the AM ? ?Wynelle Cleveland, PharmD ?Pharmacy Resident  ?05/12/2021 ?8:26 AM ? ? ?

## 2021-05-12 NOTE — Progress Notes (Signed)
Patient with two large black stools.  Patient reports abdominal cramping, lightheadedness, and blood pressure has trended down (97/57).  GI and Darel Hong notified ?

## 2021-05-12 NOTE — Progress Notes (Signed)
?[facility]  ?ROUNDING NOTE  ? ?Subjective:  ? 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   ? ? Patient has been admitted for Hemorrhagic shock ([place]) [R57.8] ?GIB (gastrointestinal bleeding) [K92.2] ?Upper GI bleed [K92.2] ? ? ?Update: ?Feels better today. ?Wife at bedside ?Now has foley ?Good UOP ?Patient asking for food ? ? ?Objective:  ?Vital signs in last 24 hours:  ?Temp:  [98 ?F (36.7 ?C)-100.2 ?F (37.9 ?C)] 98 ?F (36.7 ?C) (04/19 0830) ?Pulse Rate:  [77-141] 122 (04/19 0830) ?Resp:  [10-27] 18 (04/19 0830) ?BP: (87-119)/(54-102) 107/67 (04/19 0830) ?SpO2:  [92 %-100 %] 95 % (04/19 0830) ?Weight:  [95 kg] 95 kg (04/19 0500) ? ?Weight change: 6.017 kg ?Filed Weights  ? 05/11/21 0508 05/11/21 0830 05/12/21 0500  ?Weight: 86.2 kg 92.2 kg 95 kg  ? ? ?Intake/Output: ?I/O last 3 completed shifts: ?In: 2801.1 [I.V.:440.3; Blood:1385; IV Piggyback:975.8] ?Out: 3400 [Urine:3400] ?  ?Intake/Output this shift: ? Total I/O ?In: 21.5 [I.V.:21.5] ?Out: -  ? ?Physical Exam: ?General: NAD  ?Head: Normocephalic, atraumatic.    ?Eyes: Anicteric  ?Lungs:  Clear to auscultation, normal effort  ?Heart: Irregular rate and rhythm  ?Abdomen:  Soft, nontender, nondistended  ?Extremities: + peripheral edema.  ?Neurologic: Alert, oriented    ?Skin: Warm, dry  ?Access: Rt permcath placed on 04/22/21 by Dr Lucky Cowboy  ? ? ?Basic Metabolic Panel: ?Recent Labs  ?Lab 05/11/21 ?8182 05/11/21 ?2147 05/12/21 ?9937  ?NA 136 135 134*  ?K 3.9 4.3 4.3  ?CL 100 103 103  ?CO2 23 20* 20*  ?GLUCOSE 138* 113* 114*  ?BUN 107* 140* 146*  ?CREATININE 3.75* 4.57* 4.82*  ?CALCIUM 6.8* 7.0* 7.1*  ?MG 1.7 1.8 1.9  ?PHOS  --   --  5.0*  ? ? ? ?Liver Function Tests: ?Recent Labs  ?Lab 05/11/21 ?1696 05/12/21 ?7893  ?AST 23 26  ?ALT 18 35  ?ALKPHOS 31* 31*   ?BILITOT 0.5 1.2  ?PROT 3.7* 3.9*  ?ALBUMIN 1.8* 2.0*  ? ? ?No results for input(s): LIPASE, AMYLASE in the last 168 hours. ? ?No results for input(s): AMMONIA in the last 168 hours. ? ?CBC: ?Recent Labs  ?Lab 05/11/21 ?8101 05/11/21 ?1316 05/11/21 ?2146 05/12/21 ?0239 05/12/21 ?7510 05/12/21 ?2585  ?WBC 17.7*  --   --   --  18.8*  --   ?NEUTROABS 13.7*  --   --   --   --   --   ?HGB 4.1* 6.3* 7.5* 7.1* 7.0* 6.9*  ?HCT 13.1* 18.7* 21.8* 20.5* 20.3* 19.9*  ?MCV 92.3  --   --   --  86.8  --   ?PLT 149*  --   --   --  103*  --   ? ? ? ?Cardiac Enzymes: ?No results for input(s): CKTOTAL, CKMB, CKMBINDEX, TROPONINI in the last 168 hours. ? ?BNP: ?Invalid input(s): POCBNP ? ?CBG: ?Recent Labs  ?Lab 05/11/21 ?0820  ?GLUCAP 95  ? ? ? ?Microbiology: ?Results for orders placed or performed during the hospital encounter of 05/11/21  ?Culture, blood (Routine X 2) w Reflex to ID Panel     Status: None (Preliminary result)  ? Collection Time: 05/11/21  1:16 PM  ? Specimen: BLOOD  ?Result Value Ref Range Status  ? Specimen Description BLOOD LEFT HAND  Final  ? Special Requests   Final  ?  BOTTLES  DRAWN AEROBIC AND ANAEROBIC Blood Culture adequate volume  ? Culture   Final  ?  NO GROWTH < 24 HOURS ?Performed at [facility], [address] [place], [place] [address] ?  ? Report Status PENDING  Incomplete  ?Culture, blood (Routine X 2) w Reflex to ID Panel     Status: None (Preliminary result)  ? Collection Time: 05/11/21  1:16 PM  ? Specimen: BLOOD  ?Result Value Ref Range Status  ? Specimen Description BLOOD RIGHT HAND  Final  ? Special Requests   Final  ?  BOTTLES DRAWN AEROBIC ONLY Blood Culture results may not be optimal due to an inadequate volume of blood received in culture bottles  ? Culture   Final  ?  NO GROWTH < 24 HOURS ?Performed at [facility], [address] [place], [place] [address] ?  ? Report Status PENDING  Incomplete  ?MRSA Next Gen by PCR, Nasal     Status: None  ? Collection Time:  05/11/21  7:00 PM  ? Specimen: Nasal Mucosa; Nasal Swab  ?Result Value Ref Range Status  ? MRSA by PCR Next Gen NOT DETECTED NOT DETECTED Final  ?  Comment: (NOTE) ?The GeneXpert MRSA Assay (FDA approved for NASAL specimens only), ?is one component of a comprehensive MRSA colonization surveillance ?program. It is not intended to diagnose MRSA infection nor to guide ?or monitor treatment for MRSA infections. ?Test performance is not FDA approved in patients less than 2 years ?old. ?Performed at [facility], [place], ?[place] [address] ?  ? ? ?Coagulation Studies: ?Recent Labs  ?  05/11/21 ?5427 05/12/21 ?0623  ?LABPROT 21.0* 15.8*  ?INR 1.8* 1.3*  ? ? ? ?Urinalysis: ?No results for input(s): COLORURINE, LABSPEC, [place], GLUCOSEU, HGBUR, BILIRUBINUR, KETONESUR, PROTEINUR, UROBILINOGEN, NITRITE, LEUKOCYTESUR in the last 72 hours. ? ?Invalid input(s): APPERANCEUR ?  ? ? ?Imaging: ?DG Chest Port 1 View ? ?Result Date: 05/11/2021 ?CLINICAL DATA:  Placement of central venous catheter EXAM: PORTABLE CHEST 1 VIEW COMPARISON:  04/23/2021 FINDINGS: There is interval placement of left IJ central venous catheter with its tip at the junction of left innominate vein and superior vena cava. Right IJ dialysis catheter is noted with its tip at the junction of superior vena cava and right atrium. Transverse diameter of heart is increased. There are linear densities in the lower lung fields, more so on the left side. There is interval improvement in aeration in the right lower lung fields. Left lateral CP angle is indistinct. There is no pneumothorax. IMPRESSION: There is interval placement of left IJ central venous catheter with its tip at the junction of left innominate vein and superior vena cava. There is improvement in aeration of right lower lung fields. Residual patchy infiltrates are seen in the left lower lung fields suggesting atelectasis/pneumonia. There are no signs of alveolar pulmonary edema.  There is no pneumothorax. Electronically Signed   By: Elmer Picker M.D.   On: 05/11/2021 10:04   ? ? ?Medications:  ? ? ceFEPime (MAXIPIME) IV 1 g (05/12/21 0929)  ? lactated ringers Stopped (05/11/21 7628)  ? magnesium sulfate bolus IVPB 2 g (05/12/21 0924)  ? metronidazole Stopped (05/11/21 2249)  ? norepinephrine (LEVOPHED) Adult infusion    ? pantoprazole 8 mg/hr (05/12/21 0900)  ? ? sodium chloride   Intravenous Once  ? Chlorhexidine Gluconate Cloth  6 each Topical Q0600  ? hydrocortisone sod succinate (SOLU-CORTEF) inj  100 mg Intravenous Q8H  ? [START ON  05/14/2021] pantoprazole  40 mg Intravenous Q12H  ? ?docusate sodium, metoprolol tartrate, polyethylene glycol ? ?Assessment/ Plan:  ?Blake West is a 71 y.o.  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  Patient presents to the emergency department with complaints of nausea, vomiting, generalized decline.  Patient has been admitted for Hemorrhagic shock ([place]) [R57.8] ?GIB (gastrointestinal bleeding) [K92.2] ?Upper GI bleed [K92.2] ? ? ?Acute Kidney Injury with hyperkalemia on chronic kidney disease stage IV with baseline creatinine 3.0 and GFR of 21 on 02/17/21.  ?AKI multifactorial with contributions from ANCA vasculitis and ATN.  ?  ?Chronic kidney disease is secondary to diabetes, NSAID use and hypertension.  ? ?Suspicion of untreated vasculitis leading to rapidly progressive glomerulonephritis ?CT abdomen pelvis negative for obstruction.  No exposure to IV contrast.  Patient received initial dialysis 04/22/21.  ?QuantiFERON-TB gold obtained on 04/22/21 ?Completed renal biopsy 04/26/21.  Preliminary results an ANCA related disease, 38% fibrous-cellular crescents, 50% interstitial fibrosis with moderate to severe scarring and ATN.  ? ?Given rituximab 1 gm on 04/30/2021 ?Was on Prednisone 60 md  daily ?Bactrim DS three times per week ? ?- stress dose steroids at present. High BUN likely due to steroids ?- DIalysis MWF schedule ?- will wait and see about timing of next rituximab dose ? ? ?Lab Results  ?Comp

## 2021-05-12 NOTE — Progress Notes (Signed)
H and H sent per order.  Results pending ? ?

## 2021-05-12 NOTE — Progress Notes (Signed)
? ?Jonathon Bellows , MD ?[address], [place], [place], [place], [address] ?[address] [place], [place], [place], [address] ?Phone: [phone]  ?Fax: [phone] ? ? ?Blake West is being followed for GI bleed  Day 1 of follow up  ? ?Subjective: ?Doing well no abdominal pain , no bowel movements, no chest pain, no difficulty breathing  ? ? ?Objective: ?Vital signs in last 24 hours: ?Vitals:  ? 05/12/21 0300 05/12/21 0400 05/12/21 0500 05/12/21 0600  ?BP:      ?Pulse: (!) 124 (!) 116 (!) 115 (!) 102  ?Resp: 12 14 14 16   ?Temp:  98 ?F (36.7 ?C)    ?TempSrc:  Axillary    ?SpO2: 95% 95% 95% 95%  ?Weight:   95 kg   ?Height:      ? ?Weight change: 6.017 kg ? ?Intake/Output Summary (Last 24 hours) at 05/12/2021 0805 ?Last data filed at 05/12/2021 [phone] ?Gross per 24 hour  ?Intake 2151.25 ml  ?Output 3400 ml  ?Net -1248.75 ml  ? ? ? ?Exam: ?Heart:: S1S2 present, without murmur or extra heart sounds, or irregular rate and rhythm ?Lungs: normal and clear to auscultation ?Abdomen: soft, nontender, normal bowel sounds ? ? ?Lab Results: ?@LABTEST2 @ ?Micro Results: ?Recent Results (from the past 240 hour(s))  ?MRSA Next Gen by PCR, Nasal     Status: None  ? Collection Time: 05/11/21  7:00 PM  ? Specimen: Nasal Mucosa; Nasal Swab  ?Result Value Ref Range Status  ? MRSA by PCR Next Gen NOT DETECTED NOT DETECTED Final  ?  Comment: (NOTE) ?The GeneXpert MRSA Assay (FDA approved for NASAL specimens only), ?is one component of a comprehensive MRSA colonization surveillance ?program. It is not intended to diagnose MRSA infection nor to guide ?or monitor treatment for MRSA infections. ?Test performance is not FDA approved in patients less than 2 years ?old. ?Performed at [facility], [place], ?[place] [address] ?  ? ?Studies/Results: ?DG Chest Port 1 View ? ?Result Date: 05/11/2021 ?CLINICAL DATA:  Placement of central venous catheter EXAM: PORTABLE CHEST 1 VIEW COMPARISON:  04/23/2021 FINDINGS: There is  interval placement of left IJ central venous catheter with its tip at the junction of left innominate vein and superior vena cava. Right IJ dialysis catheter is noted with its tip at the junction of superior vena cava and right atrium. Transverse diameter of heart is increased. There are linear densities in the lower lung fields, more so on the left side. There is interval improvement in aeration in the right lower lung fields. Left lateral CP angle is indistinct. There is no pneumothorax. IMPRESSION: There is interval placement of left IJ central venous catheter with its tip at the junction of left innominate vein and superior vena cava. There is improvement in aeration of right lower lung fields. Residual patchy infiltrates are seen in the left lower lung fields suggesting atelectasis/pneumonia. There are no signs of alveolar pulmonary edema. There is no pneumothorax. Electronically Signed   By: Elmer Picker M.D.   On: 05/11/2021 10:04   ?Medications: I have reviewed the patient's current medications. ?Scheduled Meds: ? sodium chloride   Intravenous Once  ? Chlorhexidine Gluconate Cloth  6 each Topical Q0600  ? hydrocortisone sod succinate (SOLU-CORTEF) inj  100 mg Intravenous Q8H  ? [START ON 05/14/2021] pantoprazole  40 mg Intravenous Q12H  ? ?Continuous Infusions: ? ceFEPime (MAXIPIME) IV Stopped (05/11/21 1231)  ? lactated ringers Stopped (05/11/21 5974)  ? metronidazole Stopped (05/11/21  2249)  ? norepinephrine (LEVOPHED) Adult infusion    ? pantoprazole 8 mg/hr (05/12/21 4332)  ? vancomycin    ? ?PRN Meds:.docusate sodium, metoprolol tartrate, polyethylene glycol ? ? ?  Latest Ref Rng & Units 05/12/2021  ?  6:59 AM 05/12/2021  ?  5:20 AM 05/12/2021  ?  2:39 AM  ?CBC  ?WBC 4.0 - 10.5 K/uL  18.8     ?Hemoglobin 13.0 - 17.0 g/dL 6.9   7.0   7.1    ?Hematocrit 39.0 - 52.0 % 19.9   20.3   20.5    ?Platelets 150 - 400 K/uL  103     ? ? ?Assessment: ?Principal Problem: ?  GIB (gastrointestinal bleeding) ?Active  Problems: ?  Hemorrhagic shock ([place]) ? ?Blake West is a 71 y.o. y/o male with with history of CKD, atrial fibrillation on Eliquis presented to the emergency room with a 1 day history of melena hemoglobin was 4 g.  Elevated BUN/creatinine ratio.  Suspect severe upper GI bleed as he presented in shock in the ER.Hb stable post transfusion ?  ?  ?Plan ?1.  2 large-bore IV cannulas.  Monitor CBC and transfuse as needed. ?2.  IV PPI gtt. ?3.  Hold Eliquis after 2 days of holding Eliquis with plan for EGD which would be Thursday ie tomorrow . ?4.  If having further episodes of bleeding and drop in hemoglobin consider tagged RBC scan ? ? ?I have discussed alternative options, risks & benefits,  which include, but are not limited to, bleeding, infection, perforation,respiratory complication & drug reaction.  The patient agrees with this plan & written consent will be obtained.   ? ? ? ? ? LOS: 1 day  ? ?Jonathon Bellows, MD ?05/12/2021, 8:05 AM  ?

## 2021-05-12 NOTE — Progress Notes (Addendum)
? ?NAME:  Blake West, MRN:  [id number], DOB:  06/15/1950, LOS: 1 ?ADMISSION DATE:  05/11/2021, CONSULTATION DATE:  05/11/2021 ?REFERRING MD:  Dr. Alfred Levins, CHIEF COMPLAINT:  Melena  ? 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   ? ?History of Present Illness:  ?71 yo M presenting to [facility] ED from home via EMS with c/o black tarry stools for the past 2 days.  Most recent episode was earlier this morning. He also reports associated dyspnea and lightheadedness on exertion.  Also reported crampy abdominal discomfort just prior to bowel movements.  Patient is taking Eliquis for chronic Atrial fibrillation, also reports he takes aspirin occasionally for pain control.  He denies other history of GI bleeding or cirrhosis. He does admit to vomiting episodes, but denies coffee-ground emesis, hematemesis, or hematochezia. He denies abdominal pain/chest pain. Over the last several days he reports feeling short of breath with exertion and dizzy. ? ?Of note, patient was recently hospitalized at [facility] from 04/19/21 to 04/30/21 after being sent over from his nephrologist with lab work noting hyperkalemia and worsening renal function.  Patient found to have a creatinine of 15.  Chest x-ray also noted scattered nodular airspace disease concerning for pneumonia.  Patient admitted to the hospital service and started on antibiotics.  Nephrology consulted and work-up underway for vasculitis.  Renal function continued to decline and patient had dialysis catheter placed and started dialysis on 3/30.  Hospital course complicated by urinary retention which has since resolved and treatment of multifocal pneumonia. For ANCA-associated vasculitis, he received Solu-Medrol which was changed to p.o.  prednisone.  Started on rituximab with next dose planned in 10 days.  Seen by pulmonary and rheumatology.  On Bactrim 3 times a week for prophylaxis. ? ?ED Course: ?Per EDP report, rectal exam grossly positive with black stool that is guaiac positive upon testing. Patient pale, hypotensive in A-fib  ?Initial Vital Signs: 98, 20, 114, 84/47, 94% on RA ?Significant Labs: Glucose 138, BUN 107, creatinine 3.75, calcium 6.8, high-sensitivity troponin 63, lactic acid 5.0, WBC 17.7, hemoglobin 4.1, hematocrit 13.1, platelets 149, PT 21, INR 1.8 ?Imaging ?Chest X-ray>>IMPRESSION: ?There is interval placement of left IJ central venous catheter with ?its tip at the junction of left innominate vein and superior vena ?cava. There is improvement in aeration of right lower lung fields. ?Residual patchy infiltrates are seen in the left lower lung fields ?suggesting atelectasis/pneumonia. There are no signs of alveolar ?pulmonary edema. There is no pneumothorax. ?Medications Administered:  Protonix bolus started, 250 mL IVF bolus, Kcentra, 1 unit emergent PRBC's, levophed drip started ? ?PCCM asked to admit.  GI consulted. ? ?Pertinent  Medical History  ?HFrEF (LVEF 40-45%) ?ESRD on iHD ?ANCA-associated vasculitis ?Chronic Atrial Fibrillation on Eliquis ?HTN ?Hypothyroidism ?HLD ?CAD ?Gout ?T2DM ? ?Micro Data:  ?4/18: Blood culture x2 >> ? ?Antimicrobials:  ?Cefepime 4/18>> ?Flagyl 4/18>> ?Vancomycin 4/18>>4/19 ? ?Significant Hospital Events: ?Including procedures, antibiotic start and stop dates in addition to other pertinent events   ?4/18: Presented to ED with melena, hemorrhagic shock.  PCCM asked to admit.  Will place central line. GI consulted.  Transfused 4 units of blood. ?4/19: Overnight with A.fib w/ RVR, rate better controlled s/p 150 mg Amiodarone bolus & 2.5 mg IV Metoprolol.  Weaned of Levophed.  Hemoglobin remains stable, will give additional 1 unit pRBC's with HD today.  GI plan for EGD tomorrow. D/c  Vancomycin ? ?Interim History / Subjective:  ?-Overnight with A-fib w/ RVR ~ required 2.5 mg IV Metoprolol & 150 mg Amiodarone bolus ?-Remains in A-fib, rate more controlled, HR 90-110 ~ considering Cardiology consult ?-HS Troponin peaked at 347, consistent with demand ischemia ?-No bleeding reported overnight ?-S/p 4 units of PRBC's yesterday ~ Hgb remains fairly stable at 6.9 ( 6.3 ~ 7.5 ~ 7.1 ~ 7.0 ~ 6.9) ?-Considering transfusion of additional unit of pRBC if pt to undergo HD today ~ will discuss with Dr. Mortimer Fries ?-Weaned off Levophed, lactic acid has normalized ?-GI planning for EGD tomorrow ?-Discussed with Dr. Mortimer Fries, will d/c Vanc and leave Cefepime and Flagyl for now until cultures have resulted ? ?Objective   ?Blood pressure 107/70, pulse (!) 102, temperature 98 ?F (36.7 ?C), temperature source Axillary, resp. rate 16, height 5\' 11"  (1.803 m), weight 95 kg, SpO2 95 %. ?CVP:  [6 mmHg-12 mmHg] 12 mmHg  ?   ? ?Intake/Output Summary (Last 24 hours) at 05/12/2021 0801 ?Last data filed at 05/12/[phone] [id number] ?Gross per 24 hour  ?Intake 2151.25 ml  ?Output 3400 ml  ?Net -1248.75 ml  ? ? ?Filed Weights  ? 05/11/21 0508 05/11/21 0830 05/12/21 0500  ?Weight: 86.2 kg 92.2 kg 95 kg  ? ? ?Examination: ?General: Acute on chronically ill-appearing male, laying in bed, on room air, no acute distress ?HENT: Atraumatic, normocephalic, neck supple, no JVD, dry mucous membranes ?Lungs: Clear to auscultation bilaterally, even, nonlabored, normal effort ?Cardiovascular: Tachycardia, irregular irregular rhythm, no murmurs, rubs, gallops ?Abdomen: Soft, nontender, nondistended, no guarding rebound tenderness, bowel sounds positive x4 ?Extremities: No deformities, 3+ edema bilateral lower extremities, no cyanosis or clubbing ?Neuro: Awake, alert and oriented x4, moves all extremities to command, no focal deficits, speech clear, pupils PERRLA ?GU: Voiding in urinal ? ?Resolved Hospital Problem list   ? ? ?Assessment & Plan:  ? ?Acute Upper  Gastrointestinal Bleed in the setting of chronic systemic anticoagulation ?Acute blood loss anemia ?PMHx: A-fib on Eliquis ?Eliquis reversed with Kcentra in ED, Protonix bolus started in ED, 1 unit of uncross-matched blood started in ED ?- Protonix gtt ?- Maintain up to date type & screen ?- continuous cardiac monitoring ?- NPO ~ advance as per GI ~ discussed with GI, pt may have clear liquids till MN tonight, then NPO for EGD ?- Monitor for s/s of bleeding ?- Daily CBC, PT/ INR monitoring PRN (follow H&H q6h) ?- Transfuse for Hgb <8 ?-S/p 4 units of pRBCs on 4/18, will give 1 additional unit today 4/19 with HD ?-Continue to hold Eliquis ?- GI following, appreciate input ~ plan for EGD once stabilized and Eliquis on hold for 2 days ~ likely Thursday 4/20 (if further episodes of bleeding and drop in H&H, recommends tagged RBC scan) ?  ?Hemorrhagic Shock ~ RESOLVED ?Mildly elevated Troponin, suspect demand ischemia ?Atrial Fibrillation w/ RVR ?Chronic HFrEF without acute exacerbation ?PMHx: Chronic Atrial Fibrillation on Eliquis ?-Continuous cardiac monitoring ?-Maintain MAP >65 ?-Transfusions as indicated ?-Vasopressors as needed to maintain MAP goal ~ currently weaned off ?-Continue stress dose steroids given pt was previously on steroids for vasculitis ?-Lactic acid has normalized ?-HS Troponin peaked at 347 ?-Echocardiogram 04/28/21: LVEF 40-45%, indeterminate diastolic parameters, RV systolic function low normal ?-Unable to anticoagulate due to GI bleed ?-S/p Amiodarone bolus 4/19 with better HR control, Prn Metoprolol  ?-Consider Cardiology consult ? ?Leukocytosis ?Meets SIRS  Criteria on admission (WBC 17.7, HR 114, Lactic 5.0) ?Recently treated for Multifocal Pneumonia 3/27-4/7, completed course of Azithromycin & Ceftriaxone, discharged home on Bactrim DS 3x per week ?-Monitor fever curve ?-Trend WBC's & Procalcitonin ?-Follow cultures as above ?-Discussed with Dr Mortimer Fries, will continue  empiric Cefepime & Flagyl  (d/c Vancomycin 4/19) pending cultures & sensitivities due to immunocompromised status s/p Rituximab on 04/30/21 ? ?ESRD on iHD secondary to glomerulonephritis ?-Monitor I&O's / urinary output ?-Follow BMP ?-Ensure adequat

## 2021-05-12 NOTE — TOC Initial Note (Signed)
Transition of Care (TOC) - Initial/Assessment Note  ? ? ?Patient Details  ?Name: Blake West ?MRN: [id number] ?Date of Birth: 02-Mar-1950 ? ?Transition of Care (TOC) CM/SW Contact:    ?Shelbie Hutching, RN ?Phone Number: ?05/12/2021, 12:19 PM ? ?Clinical Narrative:                 ?Patient admitted for GI bleeding, has had 5 units of blood so far.  He is dialysis patient MWF at home.  Wife provides his transportation.  Patient is independent at home, at last discharge earlier this month he was set up with [facility], Corene Cornea with [facility] is aware of admission.   ?TOC will cont to follow.  ? ?Expected Discharge Plan: [place] ?Barriers to Discharge: Continued Medical Work up ? ? ?Patient Goals and CMS Choice ?Patient states their goals for this hospitalization and ongoing recovery are:: patient is hungry and wants to be able to eat ?  ?  ? ?Expected Discharge Plan and Services ?Expected Discharge Plan: [place] ?  ?Discharge Planning Services: CM Consult ?  ?Living arrangements for the past 2 months: [place] ?                ?DME Arranged: N/A ?DME Agency: NA ?  ?  ?  ?HH Arranged: PT, RN, OT ?[place] Agency: [place] ([place]) ?Date HH Agency Contacted: 05/12/21 ?Time [place]: 1218 ?Representative spoke with at [place]: Corene Cornea ? ?Prior Living Arrangements/Services ?Living arrangements for the past 2 months: [place] ?Lives with:: Spouse ?Patient language and need for interpreter reviewed:: Yes ?Do you feel safe going back to the place where you live?: Yes      ?Need for Family Participation in Patient Care: Yes (Comment) ?Care giver support system in place?: Yes (comment) ?Current home services: DME (walker, wheelchair, bedside commode) ?Criminal Activity/Legal Involvement Pertinent to Current Situation/Hospitalization: No - Comment as needed ? ?Activities of Daily Living ?Home Assistive Devices/Equipment: Shower chair without back, Environmental consultant  (specify type), Wheelchair ?ADL Screening (condition at time of admission) ?Patient's cognitive ability adequate to safely complete daily activities?: Yes ?Is the patient deaf or have difficulty hearing?: No ?Does the patient have difficulty seeing, even when wearing glasses/contacts?: No ?Does the patient have difficulty concentrating, remembering, or making decisions?: No ?Patient able to express need for assistance with ADLs?: Yes ?Does the patient have difficulty dressing or bathing?: Yes ?Independently performs ADLs?: No ?Communication: Independent ?Dressing (OT): Needs assistance ?Is this a change from baseline?: Pre-admission baseline ?Grooming: Needs assistance ?Is this a change from baseline?: Pre-admission baseline ?Feeding: Independent ?Bathing: Needs assistance ?Is this a change from baseline?: Pre-admission baseline ?Toileting: Independent ?Is this a change from baseline?: Pre-admission baseline ?In/Out Bed: Needs assistance ?Is this a change from baseline?: Change from baseline, expected to last <3 days ?Walks in Home: Independent ?Does the patient have difficulty walking or climbing stairs?: Yes ?Weakness of Legs: Both ?Weakness of Arms/Hands: Both ? ?Permission Sought/Granted ?Permission sought to share information with : Case Manager, Family Supports, Other (comment) ?Permission granted to share information with : Yes, Verbal Permission Granted ? Share Information with NAME: Susie Settlemyre ? Permission granted to share info w AGENCY: [facility] ? Permission granted to share info w Relationship: wife ? Permission granted to share info w Contact Information: [phone] ? ?Emotional Assessment ?Appearance:: Appears stated age ?Attitude/Demeanor/Rapport: Engaged ?Affect (typically observed): Accepting ?Orientation: : Oriented to Self, Oriented to Place, Oriented to  Time,  Oriented to Situation ?Alcohol / Substance Use: Not Applicable ?Psych Involvement: No (comment) ? ?Admission diagnosis:  Hemorrhagic  shock ([place]) [R57.8] ?GIB (gastrointestinal bleeding) [K92.2] ?Upper GI bleed [K92.2] ?Patient Active Problem List  ? Diagnosis Date Noted  ? GIB (gastrointestinal bleeding) 05/11/2021  ? Hemorrhagic shock ([place])   ? Leukocytosis 04/29/2021  ? Acute urinary retention 04/25/2021  ? Delirium 04/25/2021  ? Acute hypoxemic respiratory failure ([place]) 04/23/2021  ? ESRD (end stage renal disease) on dialysis [facility])   ? ANCA-associated vasculitis ([place])   ? Malnutrition of moderate degree 04/21/2021  ? High anion gap metabolic acidosis 28/00/3491  ? Severe sepsis with acute organ dysfunction ([place]) 04/19/2021  ? Diarrhea 04/19/2021  ? Acute renal failure superimposed on stage 4 chronic kidney disease ([place]) 04/19/2021  ? Hyperkalemia 04/19/2021  ? Multifocal pneumonia 04/19/2021  ? Chronic atrial fibrillation with RVR ([place]) 04/19/2021  ? Cough productive of clear sputum 11/08/2019  ? Chronic HFrEF (heart failure with reduced ejection fraction) ([place]) 08/08/2019  ? Vitamin D deficiency 08/08/2019  ? Nonischemic cardiomyopathy ([place]) 06/27/2019  ? Coronary artery disease involving native coronary artery of native heart without angina pectoris 06/27/2019  ? Polycythemia 06/27/2019  ? Hypothyroidism 05/11/2019  ? Gout of multiple sites 05/11/2019  ? Type 2 diabetes mellitus ([place]) 04/26/2019  ? Hyperlipidemia 04/26/2019  ? Acute congestive heart failure ([place]) 04/25/2019  ? Essential hypertension 04/25/2019  ? OSA on CPAP 04/25/2019  ? Class 2 severe obesity due to excess calories with serious comorbidity and body mass index (BMI) of 36.0 to 36.9 in adult [facility]) 04/25/2019  ? ?PCP:  Leonel Ramsay, MD ?Pharmacy:   ?[place], [place] [address] ?[place] [address] ?Phone: [phone] Fax: [phone] ? ? ? ? ?Social Determinants of Health (SDOH) Interventions ?  ? ?Readmission Risk Interventions ? ?  04/22/2021  ? 12:24 PM  ?Readmission Risk Prevention Plan  ?Transportation Screening  Complete  ?PCP or Specialist Appt within 3-5 Days Complete  ?Social Work Consult for [place] Planning/Counseling Complete  ?Palliative Care Screening Not Applicable  ?Medication Review Press photographer) Complete  ? ? ? ?

## 2021-05-12 NOTE — Progress Notes (Addendum)
Patient went into Afib with RVR with rates upto upper 140s sustaining. Patient has hx of Afib on metoprolol for rate control and Eliquis. Admitted  on 05/11/2021 with Hemorrhagic Shock in the setting of Acute GI Bleed s/p  transfusion with 4 units PRBCs. ? ? ?AFib+RVR ?Hx of Chronic Atrial Fibrillation on Metoprolol and Eliquis ?Likely provoked in the setting of Acute GI Bleed ?Elevated Troponin likely demand ischemia in the setting of Afib with RVR ?- Serial EKGs ?- Trend Troponins ?- TSH, FT4 ?- TTEcho ?- Metoprolol 2.5 mg PRN. If no improvement will consider Start Amiodarone 150 mg IV bolus, then 1mg /min x6 hrs, then 0.5mg /min for 18 hours if normal Qtc given hypotension ?- Hold Eliquis/Anticoagulation in the setting og GI Bleed ?- Keep NPO  ?- Cardiology Consult ? ? ?Rufina Falco, DNP, CCRN, FNP-C, AGACNP-BC ?Acute Care & Family Nurse Practitioner  ?[facility]  ?See Amion for personal pager ?PCCM on call pager [phone] until 7 am ? ? ?

## 2021-05-12 NOTE — Progress Notes (Signed)
Patient is alert and oriented x4. He is also very talkative. He is concerned about going to dialysis today. H & H is 6.9 on last recheck. No complaints of SOB dizziness. Did well overnight. Multiple requests for ice chips. VSS. He also had no bowel movements overnight. No acute distress noted at this time. Call bell in reach. Will continue to monitor.  ?

## 2021-05-12 NOTE — Progress Notes (Signed)
Hemodialysis Post Treatment Note: ?  ?Treatment date: 05/12/2021 ?  ?Treatment time:  3 hours  ?  ?Access:  CVC ?  ?UF Removed: -619 ml ?  ?Next Treatment: 05/14/21 ?  ?Patient started dialysis as ordered. Patient experience asymptomatic tachycardia, Dr. Candiss Norse made aware, see patient chart for interventions ordered.  Patient received 1 unit of blood during dialysis, patient tolerated well. Patient with increase confusion last 30 minutes of treatment, floor nurse aware. Report given to floor nurse Silver Huguenin, RN. ? ?

## 2021-05-12 NOTE — Progress Notes (Signed)
Bleeding scan ordered but test unable to be preformed due to [facility] closing at 4pm and they have no on-call staff. Weldon Picking, NP notified  ?

## 2021-05-13 ENCOUNTER — Inpatient Hospital Stay: Payer: PPO | Admitting: Registered Nurse

## 2021-05-13 ENCOUNTER — Encounter
Admission: EM | Disposition: A | Discharge status: Home Health | Payer: Self-pay | Source: Home / Self Care | Attending: Internal Medicine

## 2021-05-13 ENCOUNTER — Ambulatory Visit
Admission: RE | Admit: 2021-05-13 | Discharge: 2021-05-13 | Disposition: A | Discharge status: Home / Self Care | Payer: MEDICAID | Source: Ambulatory Visit | Attending: Orthopedic Surgery | Admitting: Orthopedic Surgery

## 2021-05-13 ENCOUNTER — Encounter: Payer: Self-pay | Admitting: Internal Medicine

## 2021-05-13 DIAGNOSIS — N017 Rapidly progressive nephritic syndrome with diffuse crescentic glomerulonephritis: Secondary | ICD-10-CM

## 2021-05-13 DIAGNOSIS — K922 Gastrointestinal hemorrhage, unspecified: Secondary | ICD-10-CM | POA: Diagnosis not present

## 2021-05-13 HISTORY — PX: ESOPHAGOGASTRODUODENOSCOPY (EGD) WITH PROPOFOL: SHX5813

## 2021-05-13 LAB — HEMOGLOBIN AND HEMATOCRIT, BLOOD
HCT: 20.6 % — ABNORMAL LOW (ref 39.0–52.0)
HCT: 21.1 % — ABNORMAL LOW (ref 39.0–52.0)
HCT: 20.1 % — ABNORMAL LOW (ref 39.0–52.0)
Hemoglobin: 7.1 g/dL — ABNORMAL LOW (ref 13.0–17.0)
Hemoglobin: 7.1 g/dL — ABNORMAL LOW (ref 13.0–17.0)
Hemoglobin: 6.8 g/dL — ABNORMAL LOW (ref 13.0–17.0)

## 2021-05-13 LAB — BASIC METABOLIC PANEL
Anion gap: 7 (ref 5–15)
BUN: 90 mg/dL — ABNORMAL HIGH (ref 8–23)
CO2: 25 mmol/L (ref 22–32)
Calcium: 6.8 mg/dL — ABNORMAL LOW (ref 8.9–10.3)
Chloride: 104 mmol/L (ref 98–111)
Creatinine, Ser: 3.68 mg/dL — ABNORMAL HIGH (ref 0.61–1.24)
GFR, Estimated: 17 mL/min — ABNORMAL LOW (ref >60)
Glucose, Bld: 125 mg/dL — ABNORMAL HIGH (ref 70–99)
Potassium: 3.6 mmol/L (ref 3.5–5.1)
Sodium: 136 mmol/L (ref 135–145)

## 2021-05-13 LAB — PROTIME-INR
INR: 1.3 — ABNORMAL HIGH (ref 0.8–1.2)
Prothrombin Time: 15.7 seconds — ABNORMAL HIGH (ref 11.4–15.2)

## 2021-05-13 LAB — APTT: aPTT: 26 seconds (ref 24–36)

## 2021-05-13 LAB — HEPATITIS B SURFACE ANTIBODY, QUANTITATIVE: Hep B S AB Quant (Post): <3.1 m[IU]/mL — ABNORMAL LOW (ref >9.9)

## 2021-05-13 LAB — PHOSPHORUS: Phosphorus: 4.9 mg/dL — ABNORMAL HIGH (ref 2.5–4.6)

## 2021-05-13 LAB — PREPARE RBC (CROSSMATCH)

## 2021-05-13 LAB — CBC
HCT: 18.7 % — ABNORMAL LOW (ref 39.0–52.0)
Hemoglobin: 6.3 g/dL — ABNORMAL LOW (ref 13.0–17.0)
MCH: 31 pg (ref 26.0–34.0)
MCHC: 33.7 g/dL (ref 30.0–36.0)
MCV: 92.1 fL (ref 80.0–100.0)
Platelets: 96 10*3/uL — ABNORMAL LOW (ref 150–400)
RBC: 2.03 MIL/uL — ABNORMAL LOW (ref 4.22–5.81)
RDW: 15.3 % (ref 11.5–15.5)
WBC: 13.9 10*3/uL — ABNORMAL HIGH (ref 4.0–10.5)
nRBC: 1.3 % — ABNORMAL HIGH (ref 0.0–0.2)

## 2021-05-13 LAB — PROCALCITONIN: Procalcitonin: 0.62 ng/mL

## 2021-05-13 LAB — MAGNESIUM: Magnesium: 1.7 mg/dL (ref 1.7–2.4)

## 2021-05-13 SURGERY — ESOPHAGOGASTRODUODENOSCOPY (EGD) WITH PROPOFOL
Anesthesia: General

## 2021-05-13 MED ORDER — LIDOCAINE HCL (CARDIAC) PF 100 MG/5ML IV SOSY
PREFILLED_SYRINGE | INTRAVENOUS | Status: DC | PRN
  Administered 2021-05-13: 40 mg via INTRAVENOUS

## 2021-05-13 MED ORDER — LACTATED RINGERS IV SOLN: INTRAVENOUS | Status: DC | PRN

## 2021-05-13 MED ORDER — POTASSIUM CHLORIDE 20 MEQ PO PACK
20.0000 meq | PACK | Freq: Once | ORAL | Status: AC
  Administered 2021-05-13: 20 meq via ORAL
  Filled 2021-05-13: qty 1

## 2021-05-13 MED ORDER — MAGNESIUM SULFATE 2 GM/50ML IV SOLN
2.0000 g | Freq: Once | INTRAVENOUS | Status: AC
  Administered 2021-05-13: 2 g via INTRAVENOUS
  Filled 2021-05-13: qty 50

## 2021-05-13 MED ORDER — PROPOFOL 500 MG/50ML IV EMUL
INTRAVENOUS | Status: DC | PRN
  Administered 2021-05-13: 125 ug/kg/min via INTRAVENOUS

## 2021-05-13 MED ORDER — PROPOFOL 10 MG/ML IV BOLUS
INTRAVENOUS | Status: DC | PRN
  Administered 2021-05-13: 70 mg via INTRAVENOUS

## 2021-05-13 MED ORDER — SODIUM CHLORIDE 0.9% IV SOLUTION: Freq: Once | INTRAVENOUS | Status: AC

## 2021-05-13 MED ORDER — LACTATED RINGERS IV BOLUS
500.0000 mL | Freq: Once | INTRAVENOUS | Status: AC
  Administered 2021-05-13: 500 mL via INTRAVENOUS

## 2021-05-13 MED ORDER — SODIUM CHLORIDE 0.9 % IV SOLN: INTRAVENOUS | Status: DC

## 2021-05-13 NOTE — H&P (Signed)
? ? ? ?Jonathon Bellows, MD ?[address], [place], [place], [place], [address] ?[address] [place], [place], [place], [address] ?Phone: [phone]  ?Fax: [phone] ? ?Primary Care Physician:  Leonel Ramsay, MD ? ? ?Pre-Procedure History & Physical: ?HPI:  Blake West is a 71 y.o. male is here for an endoscopy  ?  ?Past Medical History:  ?Diagnosis Date  ? Abnormal EKG   ? Biventricular failure ([place])   ? CAD (coronary artery disease)   ? Colorectal polyps   ? Gout   ? Hyperlipidemia LDL goal <70   ? Hypertension   ? Hypothyroidism   ? Kidney stone   ? Pneumonia   ? Testicular hypofunction   ? Type 2 diabetes mellitus ([place]) 04/26/2019  ? ? ?Past Surgical History:  ?Procedure Laterality Date  ? CARDIAC CATHETERIZATION    ? DIALYSIS/PERMA CATHETER INSERTION N/A 04/22/2021  ? Procedure: DIALYSIS/PERMA CATHETER INSERTION;  Surgeon: Algernon Huxley, MD;  Location: [facility];  Service: Cardiovascular;  Laterality: N/A;  ? FOOT SURGERY    ? RIGHT/LEFT HEART CATH AND CORONARY ANGIOGRAPHY N/A 04/30/2019  ? Procedure: RIGHT/LEFT HEART CATH AND CORONARY ANGIOGRAPHY;  Surgeon: Troy Sine, MD;  Location: [facility];  Service: Cardiovascular;  Laterality: N/A;  ? ? ?Prior to Admission medications   ?Medication Sig Start Date End Date Taking? Authorizing Provider  ?acidophilus (RISAQUAD) CAPS capsule Take 2 capsules by mouth 3 (three) times daily. 04/30/21  Yes Annita Brod, MD  ?apixaban (ELIQUIS) 5 MG TABS tablet Take 1 tablet (5 mg total) by mouth 2 (two) times daily. 04/30/21  Yes Annita Brod, MD  ?Ascorbic Acid (VITAMIN C) 100 MG tablet Take 100 mg by mouth daily.   Yes [provider]  ?calcium-vitamin D (OSCAL WITH D) 500-5 MG-MCG tablet Take 1 tablet by mouth.   Yes [provider]  ?magnesium 30 MG tablet Take 30 mg by mouth 2 (two) times daily.   Yes [provider]  ?metoprolol succinate (TOPROL-XL) 50 MG 24 hr tablet Take 3 tablets (150 mg total) by mouth  daily. ?Patient taking differently: Take 75 mg by mouth daily. 05/01/21  Yes Annita Brod, MD  ?omega-3 acid ethyl esters (LOVAZA) 1 g capsule Take 1 g by mouth 2 (two) times daily.   Yes [provider]  ?predniSONE (DELTASONE) 20 MG tablet Take 3 tablets (60 mg total) by mouth daily with breakfast. 05/01/21  Yes Annita Brod, MD  ?QUEtiapine (SEROQUEL) 100 MG tablet Take 1 tablet (100 mg total) by mouth at bedtime. 04/30/21  Yes Annita Brod, MD  ?sulfamethoxazole-trimethoprim (BACTRIM DS) 800-160 MG tablet Take 1 tablet by mouth 3 (three) times a week. ?Patient taking differently: Take 1 tablet by mouth 3 (three) times a week. Take 3 tablets by mouth Monday, Wednesday, Friday 05/03/21  Yes Annita Brod, MD  ?thiamine 250 MG tablet Take 500 mg by mouth daily.   Yes [provider]  ?levothyroxine (SYNTHROID) 25 MCG tablet Take 25 mcg by mouth every morning. ?Patient not taking: Reported on 05/11/2021 01/14/21   [provider]  ? ? ?Allergies as of 05/11/2021 - Review Complete 05/11/2021  ?Allergen Reaction Noted  ? Codeine Other (See Comments) 07/12/2013  ? ? ?Family History  ?Problem Relation Age of Onset  ? Heart attack Mother 39  ? Pulmonary fibrosis Father   ? Congestive Heart Failure Brother   ? Heart attack Brother   ? Heart  attack Brother   ? Early death Sister   ? Kidney disease Paternal Grandfather   ? Diabetes Sister   ? ? ?Social History  ? ?Socioeconomic History  ? Marital status: Married  ?  Spouse name: Not on file  ? Number of children: Not on file  ? Years of education: Not on file  ? Highest education level: Not on file  ?Occupational History  ? Occupation: Chief Financial Officer  ?Tobacco Use  ? Smoking status: Former  ?  Packs/day: 1.00  ?  Years: 30.00  ?  Pack years: 30.00  ?  Types: Cigarettes  ?  Quit date: 01/24/2002  ?  Years since quitting: 19.3  ? Smokeless tobacco: Never  ?Vaping Use  ? Vaping Use: Never used  ?Substance and Sexual Activity  ?  Alcohol use: No  ? Drug use: No  ? Sexual activity: Not on file  ?Other Topics Concern  ? Not on file  ?Social History Narrative  ? Not on file  ? ?Social Determinants of Health  ? ?Financial Resource Strain: Not on file  ?Food Insecurity: Not on file  ?Transportation Needs: Not on file  ?Physical Activity: Not on file  ?Stress: Not on file  ?Social Connections: Not on file  ?Intimate Partner Violence: Not on file  ? ? ?Review of Systems: ?See HPI, otherwise negative ROS ? ?Physical Exam: ?BP 101/65   Pulse (!) 114   Temp 97.9 ?F (36.6 ?C)   Resp 11   Ht 5\' 11"  (1.803 m)   Wt 94.2 kg   SpO2 94%   BMI 28.96 kg/m?  ?General:   Alert,  pleasant and cooperative in NAD ?Head:  Normocephalic and atraumatic. ?Neck:  Supple; no masses or thyromegaly. ?Lungs:  Clear throughout to auscultation, normal respiratory effort.    ?Heart:  +S1, +S2, Regular rate and rhythm, No edema. ?Abdomen:  Soft, nontender and nondistended. Normal bowel sounds, without guarding, and without rebound.   ?Neurologic:  Alert and  oriented x4;  grossly normal neurologically. ? ?Impression/Plan: ?Blake West is here for an endoscopy  to be performed for  evaluation of melena ?   ?Risks, benefits, limitations, and alternatives regarding endoscopy have been reviewed with the patient.  Questions have been answered.  All parties agreeable. ? ? ?Jonathon Bellows, MD  05/13/2021, 11:33 AM  ?

## 2021-05-13 NOTE — Op Note (Signed)
[facility], [place] ?Gastroenterology ?Patient Name: Blake West ?Procedure Date: 05/13/2021 10:48 AM ?MRN: [id number] ?Account #: [id number] ?Date of Birth: 12-24-1950 ?Admit Type: Outpatient ?Age: 71 ?Room: [facility] ENDO ROOM 4 ?Gender: Male ?Note Status: Finalized ?Instrument Name: Upper Endoscope [id number],EKCMK Endoscope [id number] ?Procedure:             Upper GI endoscopy ?Indications:           Melena ?Providers:             Jonathon Bellows MD, MD ?Referring MD:          Adrian Prows (Referring MD) ?Medicines:             Monitored Anesthesia Care ?Complications:         No immediate complications. ?Procedure:             Pre-Anesthesia Assessment: ?                       - Prior to the procedure, a History and Physical was  ?                       performed, and patient medications, allergies and  ?                       sensitivities were reviewed. The patient's tolerance  ?                       of previous anesthesia was reviewed. ?                       - The risks and benefits of the procedure and the  ?                       sedation options and risks were discussed with the  ?                       patient. All questions were answered and informed  ?                       consent was obtained. ?                       - ASA Grade Assessment: III - A patient with severe  ?                       systemic disease. ?                       After obtaining informed consent, the endoscope was  ?                       passed under direct vision. Throughout the procedure,  ?                       the patient's blood pressure, pulse, and oxygen  ?                       saturations were monitored continuously. The Endoscope  ?                       was introduced  through the mouth, and advanced to the  ?                       third part of duodenum. The Endoscope was introduced  ?                       through the and advanced to the. The upper GI  ?                       endoscopy was accomplished with ease. The  patient  ?                       tolerated the procedure well. ?Findings: ?     The esophagus was normal. ?     The stomach was normal. ?     One non-bleeding cratered duodenal ulcer with a clean ulcer base  ?     (Forrest Class III) was found in the second portion of the duodenum. The  ?     lesion was 15 mm in largest dimension. ?Impression:            - Normal esophagus. ?                       - Normal stomach. ?                       - Non-bleeding duodenal ulcer with a clean ulcer base  ?                       (Forrest Class III). ?                       - No specimens collected. ?Recommendation:        - Return patient to hospital ward for ongoing care. ?                       - Likely bleed from large duodenal ulcer- presently no  ?                       bleeding and stomach had no blood and was clean. ?                       Avoid NSAID's ?                       IV PPI GTT for 72 hours ?                       IF has further bleeding get tagged rbc scan to confirm  ?                       bleeding is from ulcer and not from a second site .  ?                       Ulcer in a a very tight turn between folds . ?Procedure Code(s):     --- Professional --- ?                       [phone], Esophagogastroduodenoscopy, flexible,  ?  transoral; diagnostic, including collection of  ?                       specimen(s) by brushing or washing, when performed  ?                       (separate procedure) ?Diagnosis Code(s):     --- Professional --- ?                       K26.9, Duodenal ulcer, unspecified as acute or  ?                       chronic, without hemorrhage or perforation ?                       K92.1, Melena (includes Hematochezia) ?CPT copyright 2019 American Medical Association. All rights reserved. ?The codes documented in this report are preliminary and upon coder review may  ?be revised to meet current compliance requirements. ?Jonathon Bellows, MD ?Jonathon Bellows MD, MD ?05/13/2021 11:47:26  AM ?This report has been signed electronically. ?Number of Addenda: 0 ?Note Initiated On: 05/13/2021 10:48 AM ?Estimated Blood Loss:  Estimated blood loss: none. ?     [facility] ?

## 2021-05-13 NOTE — Progress Notes (Signed)
?[facility]  ?ROUNDING NOTE  ? ?Subjective:  ? 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   ? ? Patient has been admitted for Hemorrhagic shock ([place]) [R57.8] ?GIB (gastrointestinal bleeding) [K92.2] ?Upper GI bleed [K92.2] ? ? ?Update: ?Feels better today. ?Wife at bedside ?Now has foley ?Good UOP ?Patient asking for food ? ? ?Objective:  ?Vital signs in last 24 hours:  ?Temp:  [97.1 ?F (36.2 ?C)-99.5 ?F (37.5 ?C)] 97.9 ?F (36.6 ?C) (04/20 6237) ?Pulse Rate:  [25-220] 114 (04/20 0854) ?Resp:  [11-24] 11 (04/20 0854) ?BP: (81-130)/(55-101) 101/65 (04/20 0854) ?SpO2:  [86 %-100 %] 94 % (04/20 0845) ?Weight:  [94.2 kg-94.8 kg] 94.2 kg (04/20 0453) ? ?Weight change: 2.6 kg ?Filed Weights  ? 05/12/21 1016 05/12/21 1424 05/13/21 0453  ?Weight: 94.8 kg 94.4 kg 94.2 kg  ? ? ?Intake/Output: ?I/O last 3 completed shifts: ?In: 1215.5 [I.V.:403.4; Blood:370; IV Piggyback:442] ?Out: 2576 [SEGBT:5176] ?  ?Intake/Output this shift: ? Total I/O ?In: 0  ?Out: 150 [Urine:150] ? ?Physical Exam: ?General: NAD  ?Head: Normocephalic, atraumatic.    ?Eyes: Anicteric  ?Lungs:  Clear to auscultation, normal effort,  ?Heart: Irregular rate and rhythm  ?Abdomen:  Soft, nontender, nondistended  ?Extremities: + peripheral edema.  ?Neurologic: Alert, oriented    ?Skin: Warm, dry  ?Access: Rt permcath placed on 04/22/21 by Dr Lucky Cowboy  ? ? ?Basic Metabolic Panel: ?Recent Labs  ?Lab 05/11/21 ?1607 05/11/21 ?2147 05/12/21 ?3710 05/13/21 ?0532  ?NA 136 135 134* 136  ?K 3.9 4.3 4.3 3.6  ?CL 100 103 103 104  ?CO2 23 20* 20* 25  ?GLUCOSE 138* 113* 114* 125*  ?BUN 107* 140* 146* 90*  ?CREATININE 3.75* 4.57* 4.82* 3.68*  ?CALCIUM 6.8* 7.0* 7.1* 6.8*  ?MG 1.7 1.8 1.9 1.7  ?PHOS  --   --  5.0* 4.9*  ? ? ? ?Liver Function Tests: ?Recent Labs  ?Lab  05/11/21 ?6269 05/12/21 ?4854  ?AST 23 26  ?ALT 18 35  ?ALKPHOS 31* 31*  ?BILITOT 0.5 1.2  ?PROT 3.7* 3.9*  ?ALBUMIN 1.8* 2.0*  ? ? ?No results for input(s): LIPASE, AMYLASE in the last 168 hours. ? ?No results for input(s): AMMONIA in the last 168 hours. ? ?CBC: ?Recent Labs  ?Lab 05/11/21 ?6270 05/11/21 ?1316 05/12/21 ?3500 05/12/21 ?9381 05/12/21 ?1300 05/12/21 ?1613 05/12/21 ?1855 05/13/21 ?0114 05/13/21 ?0532  ?WBC 17.7*  --  18.8*  --   --   --   --   --  13.9*  ?NEUTROABS 13.7*  --   --   --   --   --   --   --   --   ?HGB 4.1*   < > 7.0*   < > 8.6* 7.7* 7.4* 7.1* 6.3*  ?HCT 13.1*   < > 20.3*   < > 24.4* 22.3* 20.9* 20.6* 18.7*  ?MCV 92.3  --  86.8  --   --   --   --   --  92.1  ?PLT 149*  --  103*  --   --   --   --   --  96*  ? < > = values in this interval not displayed.  ? ? ? ?Cardiac Enzymes: ?No results for input(s): CKTOTAL, CKMB, CKMBINDEX, TROPONINI in the last 168 hours. ? ?BNP: ?Invalid input(s): POCBNP ? ?CBG: ?Recent Labs  ?Lab 05/11/21 ?  0820  ?GLUCAP 95  ? ? ? ?Microbiology: ?Results for orders placed or performed during the hospital encounter of 05/11/21  ?Culture, blood (Routine X 2) w Reflex to ID Panel     Status: None (Preliminary result)  ? Collection Time: 05/11/21  1:16 PM  ? Specimen: BLOOD  ?Result Value Ref Range Status  ? Specimen Description BLOOD LEFT HAND  Final  ? Special Requests   Final  ?  BOTTLES DRAWN AEROBIC AND ANAEROBIC Blood Culture adequate volume  ? Culture   Final  ?  NO GROWTH 2 DAYS ?Performed at [facility], [address], [place], [place] [address] ?  ? Report Status PENDING  Incomplete  ?Culture, blood (Routine X 2) w Reflex to ID Panel     Status: None (Preliminary result)  ? Collection Time: 05/11/21  1:16 PM  ? Specimen: BLOOD  ?Result Value Ref Range Status  ? Specimen Description BLOOD RIGHT HAND  Final  ? Special Requests   Final  ?  BOTTLES DRAWN AEROBIC ONLY Blood Culture results may not be optimal due to an inadequate volume of blood  received in culture bottles  ? Culture   Final  ?  NO GROWTH 2 DAYS ?Performed at [facility], [address] [place], [place] [address] ?  ? Report Status PENDING  Incomplete  ?MRSA Next Gen by PCR, Nasal     Status: None  ? Collection Time: 05/11/21  7:00 PM  ? Specimen: Nasal Mucosa; Nasal Swab  ?Result Value Ref Range Status  ? MRSA by PCR Next Gen NOT DETECTED NOT DETECTED Final  ?  Comment: (NOTE) ?The GeneXpert MRSA Assay (FDA approved for NASAL specimens only), ?is one component of a comprehensive MRSA colonization surveillance ?program. It is not intended to diagnose MRSA infection nor to guide ?or monitor treatment for MRSA infections. ?Test performance is not FDA approved in patients less than 2 years ?old. ?Performed at [facility], [place], ?[place] [address] ?  ? ? ?Coagulation Studies: ?Recent Labs  ?  05/11/21 ?2202 05/12/21 ?5427 05/13/21 ?0532  ?LABPROT 21.0* 15.8* 15.7*  ?INR 1.8* 1.3* 1.3*  ? ? ? ?Urinalysis: ?No results for input(s): COLORURINE, LABSPEC, Rachel, GLUCOSEU, HGBUR, BILIRUBINUR, KETONESUR, PROTEINUR, UROBILINOGEN, NITRITE, LEUKOCYTESUR in the last 72 hours. ? ?Invalid input(s): APPERANCEUR ?  ? ? ?Imaging: ?DG Chest Port 1 View ? ?Result Date: 05/11/2021 ?CLINICAL DATA:  Placement of central venous catheter EXAM: PORTABLE CHEST 1 VIEW COMPARISON:  04/23/2021 FINDINGS: There is interval placement of left IJ central venous catheter with its tip at the junction of left innominate vein and superior vena cava. Right IJ dialysis catheter is noted with its tip at the junction of superior vena cava and right atrium. Transverse diameter of heart is increased. There are linear densities in the lower lung fields, more so on the left side. There is interval improvement in aeration in the right lower lung fields. Left lateral CP angle is indistinct. There is no pneumothorax. IMPRESSION: There is interval placement of left IJ central venous catheter with its  tip at the junction of left innominate vein and superior vena cava. There is improvement in aeration of right lower lung fields. Residual patchy infiltrates are seen in the left lower lung fields suggesting atelectasis/pneumonia. There are no signs of alveolar pulmonary edema. There is no pneumothorax. Electronically Signed   By: Elmer Picker M.D.   On: 05/11/2021 10:04   ? ? ?Medications:  ? ?  ceFEPime (MAXIPIME) IV Stopped (05/12/21 6381)  ? lactated ringers Stopped (05/11/21 7711)  ? metronidazole Stopped (05/12/21 2316)  ? norepinephrine (LEVOPHED) Adult infusion    ? pantoprazole 8 mg/hr (05/13/21 0311)  ? ? sodium chloride   Intravenous Once  ? sodium chloride   Intravenous Once  ? Chlorhexidine Gluconate Cloth  6 each Topical Q0600  ? feeding supplement  1 Container Oral TID BM  ? hydrocortisone sod succinate (SOLU-CORTEF) inj  100 mg Intravenous Q8H  ? [START ON 05/14/2021] pantoprazole  40 mg Intravenous Q12H  ? ?docusate sodium, metoprolol tartrate, polyethylene glycol ? ?Assessment/ Plan:  ?Blake West is a 71 y.o.  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  Patient presents to the emergency department with complaints of nausea, vomiting, generalized decline.  Patient has been admitted for Hemorrhagic shock ([place]) [R57.8] ?GIB (gastrointestinal bleeding) [K92.2] ?Upper GI bleed [K92.2] ? ? ?Acute Kidney Injury with hyperkalemia on chronic kidney disease stage IV with baseline creatinine 3.0 and GFR of 21 on 02/17/21.  ?AKI multifactorial with contributions from ANCA vasculitis ([place]) (N01.7) and ATN.  ?  Chronic kidney disease is secondary to diabetes, NSAID use and hypertension.  ? ?CT abdomen pelvis negative for obstruction.  No exposure to IV contrast.  Patient received initial dialysis 04/22/21.  ?QuantiFERON-TB gold obtained on  04/22/21 ?Renal biopsy 04/26/21. : ANCA related disease, 38% fibrous-cellular crescents, 50% interstitial fibrosis with moderate to severe scarring and ATN.  ? ?Given rituximab 1 gm on 04/30/2021 ?Was on Prednisone 60 mg dai

## 2021-05-13 NOTE — Transfer of Care (Signed)
Immediate Anesthesia Transfer of Care Note ? ?Patient: Blake West ? ?Procedure(s) Performed: ESOPHAGOGASTRODUODENOSCOPY (EGD) WITH PROPOFOL ? ?Patient Location: PACU and Endoscopy Unit ? ?Anesthesia Type:General ? ?Level of Consciousness: awake, alert  and oriented ? ?Airway & Oxygen Therapy: Patient Spontanous Breathing ? ?Post-op Assessment: Report given to RN and Post -op Vital signs reviewed and stable ? ?Post vital signs: Reviewed and stable ? ?Last Vitals:  ?Vitals Value Taken Time  ?BP 92/71 05/13/21 1155  ?Temp    ?Pulse 137 05/13/21 1156  ?Resp 15 05/13/21 1156  ?SpO2 94 % 05/13/21 1156  ?Vitals shown include unvalidated device data. ? ?Last Pain:  ?Vitals:  ? 05/13/21 1053  ?TempSrc: Oral  ?PainSc:   ?   ? ?  ? ?Complications: No notable events documented. ?

## 2021-05-13 NOTE — Progress Notes (Addendum)
PHARMACY CONSULT NOTE - FOLLOW UP ? ?Pharmacy Consult for Electrolyte Monitoring and Replacement  ? ?Recent Labs: ?Potassium (mmol/L)  ?Date Value  ?05/13/2021 3.6  ? ?Magnesium (mg/dL)  ?Date Value  ?05/13/2021 1.7  ? ?Calcium (mg/dL)  ?Date Value  ?05/13/2021 6.8 (L)  ? ?Albumin (g/dL)  ?Date Value  ?05/12/2021 2.0 (L)  ? ?Phosphorus (mg/dL)  ?Date Value  ?05/13/2021 4.9 (H)  ? ?Sodium (mmol/L)  ?Date Value  ?05/13/2021 136  ?06/26/2019 138  ? ?Corrected Calcium: 8.4 mg/dl ? ?Assessment: ?71 year old male presenting with melena. PMH includes ESRD on HD MWF, atrial fibrillation on Eliquis 5 mg BID (unknown last dose), glomerulonephritis, HFrEF EF 40-45%, hypothyroidism, hypertension, type 2 diabetes mellitus. Admitted with acute GI bleed.  ? ?Received KCentra 4500 units x 1 4/18 AM. EGD planned 4/20 ? ?Goal of Therapy:  ?K 4-5 ?Mag > 2 ?All other electrolytes within normal limits ? ?Plan:  ?Kcl 20 meq PO packets x 1 ?Magnesium sulfate 2 grams x 1. ?Follow up electrolytes in the AM ? ?Wynelle Cleveland, PharmD ?Pharmacy Resident  ?05/13/2021 ?7:20 AM ?

## 2021-05-13 NOTE — Anesthesia Preprocedure Evaluation (Signed)
Anesthesia Evaluation  ?Patient identified by MRN, date of birth, ID band ?Patient awake ? ? ? ?Reviewed: ?Allergy & Precautions, H&P , NPO status , Patient's Chart, lab work & pertinent test results, reviewed documented beta blocker date and time  ? ?History of Anesthesia Complications ?Negative for: history of anesthetic complications ? ?Airway ?Mallampati: II ? ?TM Distance: >3 FB ?Neck ROM: full ? ? ? Dental ? ?(+) Dental Advidsory Given, Edentulous Upper, Edentulous Lower ?  ?Pulmonary ?neg shortness of breath, sleep apnea , pneumonia, COPD, neg recent URI, former smoker,  ?  ?Pulmonary exam normal ?breath sounds clear to auscultation ? ? ? ? ? ? Cardiovascular ?Exercise Tolerance: Good ?hypertension, (-) angina+ CAD and +CHF  ?(-) Past MI and (-) Cardiac Stents + dysrhythmias Atrial Fibrillation (-) Valvular Problems/Murmurs ?Rhythm:regular Rate:Normal ? ? ?  ?Neuro/Psych ?PSYCHIATRIC DISORDERS negative neurological ROS ?   ? GI/Hepatic ?negative GI ROS, Neg liver ROS,   ?Endo/Other  ?diabetesHypothyroidism  ? Renal/GU ?ESRF and DialysisRenal disease  ?negative genitourinary ?  ?Musculoskeletal ? ? Abdominal ?  ?Peds ? Hematology ?negative hematology ROS ?(+)   ?Anesthesia Other Findings ?Past Medical History: ?No date: Abnormal EKG ?No date: Biventricular failure ([place]) ?No date: CAD (coronary artery disease) ?No date: Colorectal polyps ?No date: Gout ?No date: Hyperlipidemia LDL goal <70 ?No date: Hypertension ?No date: Hypothyroidism ?No date: Kidney stone ?No date: Pneumonia ?No date: Testicular hypofunction ?04/26/2019: Type 2 diabetes mellitus ([place]) ? ? Reproductive/Obstetrics ?negative OB ROS ? ?  ? ? ? ? ? ? ? ? ? ? ? ? ? ?  ?  ? ? ? ? ? ? ? ? ?Anesthesia Physical ?Anesthesia Plan ? ?ASA: 4 ? ?Anesthesia Plan: General  ? ?Post-op Pain Management:   ? ?Induction: Intravenous ? ?PONV Risk Score and Plan: 2 and Propofol infusion and TIVA ? ?Airway Management Planned: Natural  Airway and Nasal Cannula ? ?Additional Equipment:  ? ?Intra-op Plan:  ? ?Post-operative Plan:  ? ?Informed Consent: I have reviewed the patients History and Physical, chart, labs and discussed the procedure including the risks, benefits and alternatives for the proposed anesthesia with the patient or authorized representative who has indicated his/her understanding and acceptance.  ? ? ? ?Dental Advisory Given ? ?Plan Discussed with: Anesthesiologist, CRNA and Surgeon ? ?Anesthesia Plan Comments:   ? ? ? ? ? ? ?Anesthesia Quick Evaluation ? ?

## 2021-05-13 NOTE — Anesthesia Procedure Notes (Signed)
Date/Time: 05/13/2021 11:44 AM ?Performed by: Doreen Salvage, CRNA ?Pre-anesthesia Checklist: Patient identified, Emergency Drugs available, Suction available and Patient being monitored ?Patient Re-evaluated:Patient Re-evaluated prior to induction ?Oxygen Delivery Method: Nasal cannula ?Induction Type: IV induction ?Dental Injury: Teeth and Oropharynx as per pre-operative assessment  ?Comments: Nasal cannula with etCO2 monitoring ? ? ? ? ?

## 2021-05-13 NOTE — Anesthesia Postprocedure Evaluation (Signed)
Anesthesia Post Note ? ?Patient: Blake West ? ?Procedure(s) Performed: ESOPHAGOGASTRODUODENOSCOPY (EGD) WITH PROPOFOL ? ?Patient location during evaluation: Endoscopy ?Anesthesia Type: General ?Level of consciousness: awake and alert ?Pain management: pain level controlled ?Vital Signs Assessment: post-procedure vital signs reviewed and stable ?Respiratory status: spontaneous breathing, nonlabored ventilation, respiratory function stable and patient connected to nasal cannula oxygen ?Cardiovascular status: blood pressure returned to baseline and stable ?Postop Assessment: no apparent nausea or vomiting ?Anesthetic complications: no ? ? ?No notable events documented. ? ? ?Last Vitals:  ?Vitals:  ? 05/13/21 1154 05/13/21 1214  ?BP: 92/71 95/74  ?Pulse:  (!) 120  ?Resp: 17 17  ?Temp: 36.6 ?C   ?SpO2:  94%  ?  ?Last Pain:  ?Vitals:  ? 05/13/21 1154  ?TempSrc: Temporal  ?PainSc:   ? ? ?  ?  ?  ?  ?  ?  ? ?Martha Clan ? ? ? ? ?

## 2021-05-13 NOTE — Progress Notes (Signed)
? ?NAME:  Blake West, MRN:  [id number], DOB:  Mar 25, 1950, LOS: 2 ?ADMISSION DATE:  05/11/2021, CONSULTATION DATE:  05/11/2021 ?REFERRING MD:  Dr. Alfred Levins, CHIEF COMPLAINT:  Melena  ? ?Brief Pt Description / Synopsis:  ?Blake West with PMH significant for chronic kidney disease stage IV with glomerulonephritis,  ANCA-associated vasculitis, and HFrEF (LVEF 40-45%), Atrial fibrillation on Eliquis who is  admitted 05/11/2021 with Hemorrhagic Shock in the setting of Acute GI Bleed.   ? ?History of Present Illness:  ?Blake West presenting to [facility] ED from home via EMS with c/o black tarry stools for the past 2 days.  Most recent episode was earlier this morning. He also reports associated dyspnea and lightheadedness on exertion.  Also reported crampy abdominal discomfort just prior to bowel movements.  Patient is taking Eliquis for chronic Atrial fibrillation, also reports he takes aspirin occasionally for pain control.  He denies other history of GI bleeding or cirrhosis. He does admit to vomiting episodes, but denies coffee-ground emesis, hematemesis, or hematochezia. He denies abdominal pain/chest pain. Over the last several days he reports feeling short of breath with exertion and dizzy. ? ?Of note, patient was recently hospitalized at [facility] from 04/19/21 to 04/30/21 after being sent over from his nephrologist with lab work noting hyperkalemia and worsening renal function.  Patient found to have a creatinine of Blake.  Chest x-ray also noted scattered nodular airspace disease concerning for pneumonia.  Patient admitted to the hospital service and started on antibiotics.  Nephrology consulted and work-up underway for vasculitis.  Renal function continued to decline and patient had dialysis catheter placed and started dialysis on 3/30.  Hospital course complicated by urinary retention which has since resolved and treatment of multifocal pneumonia. For ANCA-associated vasculitis, he received Solu-Medrol which was changed to p.o.  prednisone.  Started on rituximab with next dose planned in 10 days.  Seen by pulmonary and rheumatology.  On Bactrim 3 times a week for prophylaxis. ? ?ED Course: ?Per EDP report, rectal exam grossly positive with black stool that is guaiac positive upon testing. Patient pale, hypotensive in A-fib  ?Initial Vital Signs: 98, 20, 114, 84/47, 94% on RA ?Significant Labs: Glucose 138, BUN 107, creatinine 3.75, calcium 6.8, high-sensitivity troponin 63, lactic acid 5.0, WBC 17.7, hemoglobin 4.1, hematocrit 13.1, platelets 149, PT 21, INR 1.8 ?Imaging ?Chest X-ray>>There is interval placement of left IJ central venous catheter with ?its tip at the junction of left innominate vein and superior vena cava. There is improvement in aeration of right lower lung fields. Residual patchy infiltrates are seen in the left lower lung fields suggesting atelectasis/pneumonia. There are no signs of alveolar pulmonary edema. There is no pneumothorax. ?Medications Administered:  Protonix bolus started, 250 mL IVF bolus, Kcentra, 1 unit emergent PRBC's, levophed drip started ? ?PCCM asked to admit.  GI consulted. ? ?Pertinent  Medical History  ?HFrEF (LVEF 40-45%) ?ESRD on iHD ?ANCA-associated vasculitis ?Chronic Atrial Fibrillation on Eliquis ?HTN ?Hypothyroidism ?HLD ?CAD ?Gout ?T2DM ? ?Micro Data:  ?4/18: Blood culture x2 >>negative  ? ?Antimicrobials:  ?Cefepime 4/18>> ?Flagyl 4/18>> ?Vancomycin 4/18>>4/19 ? ?Significant Hospital Events: ?Including procedures, antibiotic start and stop dates in addition to other pertinent events   ?4/18: Presented to ED with melena, hemorrhagic shock.  PCCM asked to admit.  Will place central line. GI consulted.  Transfused 4 units of blood. ?4/19: Overnight with A.fib w/ RVR, rate better controlled s/p 150 mg Amiodarone bolus & 2.5 mg IV Metoprolol.  Weaned of Levophed.  Hemoglobin remains stable, will give additional 1 unit pRBC's with HD today.  GI plan for EGD tomorrow. D/c Vancomycin ?4/20: Pts  hgb 6.3 to receive 1 unit of pRBC's and undergo upper endoscopy  ? ?Interim History / Subjective:  ?No complaints at this time and no active signs of bleeding  ? ?Objective   ?Blood pressure 101/65, pulse (!) 114, temperature 97.9 ?F (36.6 ?C), resp. rate 11, height 5\' 11"  (1.803 West), weight 94.2 kg, SpO2 94 %. ?   ?   ? ?Intake/Output Summary (Last 24 hours) at 05/13/2021 0928 ?Last data filed at 05/13/2021 0093 ?Gross per 24 hour  ?Intake 507.76 ml  ?Output 26 ml  ?Net 481.76 ml  ? ?Filed Weights  ? 05/12/21 1016 05/12/21 1424 05/13/21 0453  ?Weight: 94.8 kg 94.4 kg 94.2 kg  ? ? ?Examination: ?General: Acutely ill appearing West resting in bed, NAD  ?HEENT: Supple, no JVD ?Lungs: Clear throughout, even, non labored  ?Cardiovascular: NSR, rrr, no West/R/G, 2+ radial/1+ distal pulses present, 3+ bilateral lower extremity edema   ?Abdomen: +BS x4, obese, soft, non tender, non distended  ?Extremities: Normal bulk and tone,  ?Neuro: Alert and oriented, follows commands, PERRL  ?GU: Voiding via urinal ? ?Resolved Hospital Problem list   ? ? ?Assessment & Plan:  ? ?Acute Upper Gastrointestinal Bleed in the setting of chronic systemic anticoagulation ?Acute blood loss anemia ?PMHx: A-fib on Eliquis ?Eliquis reversed with Kcentra in ED, Protonix bolus started in ED, 1 unit of uncross-matched blood started in ED ?- Protonix gtt ?- Maintain map >65 ?- Continuous telemetry monitoring  ?- NPO for now ?- Monitor for s/s of bleeding ?- Daily CBC, PT/ INR monitoring PRN (follow H&H q6h) ?- Transfuse for Hgb <8 ?- Hold outpatient eliquis ?- GI consulted appreciate input~plans for upper endoscopy 04/20 ?  ?Hemorrhagic Shock ~ RESOLVED ?Mildly elevated Troponin, suspect demand ischemia ?Atrial Fibrillation w/ RVR ?Chronic HFrEF without acute exacerbation ?PMHx: Chronic Atrial Fibrillation on Eliquis ?-Continuous cardiac monitoring ?-Maintain MAP >65 ?-Transfusions as indicated ?-Vasopressors as needed to maintain MAP goal ?-Continue  stress dose steroids given pt was previously on steroids for vasculitis ?-HS Troponin peaked at 347 ?-Echocardiogram 04/28/21: LVEF 40-45%, indeterminate diastolic parameters, RV systolic function low normal ?-Unable to anticoagulate due to GI bleed ?-Prn metoprolol for hr control ? ?Leukocytosis ?Meets SIRS Criteria on admission (WBC 17.7, HR 114, Lactic 5.0) ?Recently treated for Multifocal Pneumonia 3/27-4/7, completed course of Azithromycin & Ceftriaxone, discharged home on Bactrim DS 3x per week ?-Monitor fever curve ?-Trend WBC's & Procalcitonin ?-Follow cultures as above ?-Continue empiric cefepime and flagyl for now ? ?ESRD on iHD secondary to glomerulonephritis ?-Monitor I&O's / urinary output ?-Follow BMP ?-Ensure adequate renal perfusion ?-Avoid nephrotoxic agents as able ?-Replace electrolytes as indicated ?-Nephrology consulted appreciate input~HD per recommendations  ?  ?Best Practice (right click and "Reselect all SmartList Selections" daily)  ? ?Diet/type: NPO ?DVT prophylaxis: SCD (no chemical prophylaxis/anticoagulation given GI bleed) ?GI prophylaxis: PPI ?Lines: Central line and yes and it is still needed ?Foley:  N/A ?Code Status:  full code ?Last date of multidisciplinary goals of care discussion [05/13/21] ? ?Labs   ?CBC: ?Recent Labs  ?Lab 05/11/21 ?8182 05/11/21 ?1316 05/12/21 ?9937 05/12/21 ?1696 05/12/21 ?1300 05/12/21 ?1613 05/12/21 ?1855 05/13/21 ?0114 05/13/21 ?0532  ?WBC 17.7*  --  18.8*  --   --   --   --   --  13.9*  ?NEUTROABS 13.7*  --   --   --   --   --   --   --   --   ?  HGB 4.1*   < > 7.0*   < > 8.6* 7.7* 7.4* 7.1* 6.3*  ?HCT 13.1*   < > 20.3*   < > 24.4* 22.3* 20.9* 20.6* 18.7*  ?MCV 92.3  --  86.8  --   --   --   --   --  92.1  ?PLT 149*  --  103*  --   --   --   --   --  96*  ? < > = values in this interval not displayed.  ? ? ?Basic Metabolic Panel: ?Recent Labs  ?Lab 05/11/21 ?1856 05/11/21 ?2147 05/12/21 ?3149 05/13/21 ?0532  ?NA 136 135 134* 136  ?K 3.9 4.3 4.3 3.6  ?CL 100  103 103 104  ?CO2 23 20* 20* 25  ?GLUCOSE 138* 113* 114* 125*  ?BUN 107* 140* 146* 90*  ?CREATININE 3.75* 4.57* 4.82* 3.68*  ?CALCIUM 6.8* 7.0* 7.1* 6.8*  ?MG 1.7 1.8 1.9 1.7  ?PHOS  --   --  5.0* 4.9*  ? ?G

## 2021-05-14 ENCOUNTER — Inpatient Hospital Stay: Payer: PPO

## 2021-05-14 ENCOUNTER — Encounter: Payer: Self-pay | Admitting: Gastroenterology

## 2021-05-14 LAB — CBC
HCT: 20.1 % — ABNORMAL LOW (ref 39.0–52.0)
Hemoglobin: 6.8 g/dL — ABNORMAL LOW (ref 13.0–17.0)
MCH: 30.9 pg (ref 26.0–34.0)
MCHC: 33.8 g/dL (ref 30.0–36.0)
MCV: 91.4 fL (ref 80.0–100.0)
Platelets: 106 10*3/uL — ABNORMAL LOW (ref 150–400)
RBC: 2.2 MIL/uL — ABNORMAL LOW (ref 4.22–5.81)
RDW: 15.9 % — ABNORMAL HIGH (ref 11.5–15.5)
WBC: 9.9 10*3/uL (ref 4.0–10.5)
nRBC: 1.4 % — ABNORMAL HIGH (ref 0.0–0.2)

## 2021-05-14 LAB — BASIC METABOLIC PANEL
Anion gap: 10 (ref 5–15)
BUN: 112 mg/dL — ABNORMAL HIGH (ref 8–23)
CO2: 24 mmol/L (ref 22–32)
Calcium: 7.2 mg/dL — ABNORMAL LOW (ref 8.9–10.3)
Chloride: 104 mmol/L (ref 98–111)
Creatinine, Ser: 4.8 mg/dL — ABNORMAL HIGH (ref 0.61–1.24)
GFR, Estimated: 12 mL/min — ABNORMAL LOW (ref >60)
Glucose, Bld: 122 mg/dL — ABNORMAL HIGH (ref 70–99)
Potassium: 3.4 mmol/L — ABNORMAL LOW (ref 3.5–5.1)
Sodium: 138 mmol/L (ref 135–145)

## 2021-05-14 LAB — HEMOGLOBIN AND HEMATOCRIT, BLOOD
HCT: 20.7 % — ABNORMAL LOW (ref 39.0–52.0)
HCT: 18 % — ABNORMAL LOW (ref 39.0–52.0)
HCT: 21.7 % — ABNORMAL LOW (ref 39.0–52.0)
HCT: 19.2 % — ABNORMAL LOW (ref 39.0–52.0)
Hemoglobin: 7 g/dL — ABNORMAL LOW (ref 13.0–17.0)
Hemoglobin: 6 g/dL — ABNORMAL LOW (ref 13.0–17.0)
Hemoglobin: 7.4 g/dL — ABNORMAL LOW (ref 13.0–17.0)
Hemoglobin: 6.6 g/dL — ABNORMAL LOW (ref 13.0–17.0)

## 2021-05-14 LAB — PHOSPHORUS: Phosphorus: 5.5 mg/dL — ABNORMAL HIGH (ref 2.5–4.6)

## 2021-05-14 LAB — MAGNESIUM: Magnesium: 2.2 mg/dL (ref 1.7–2.4)

## 2021-05-14 LAB — PREPARE RBC (CROSSMATCH)

## 2021-05-14 MED ORDER — PEG 3350-KCL-NA BICARB-NACL 420 G PO SOLR
4000.0000 mL | Freq: Once | ORAL | Status: AC
  Administered 2021-05-14: 4000 mL via ORAL
  Filled 2021-05-14 (×2): qty 4000

## 2021-05-14 MED ORDER — SODIUM CHLORIDE 0.9% FLUSH
10.0000 mL | Freq: Two times a day (BID) | INTRAVENOUS | Status: DC
  Administered 2021-05-14 – 2021-05-17 (×4): 10 mL

## 2021-05-14 MED ORDER — HYDROCORTISONE SOD SUC (PF) 100 MG IJ SOLR
100.0000 mg | Freq: Every day | INTRAMUSCULAR | Status: DC
  Administered 2021-05-15 – 2021-05-16 (×2): 100 mg via INTRAVENOUS
  Filled 2021-05-14 (×2): qty 2

## 2021-05-14 MED ORDER — SODIUM CHLORIDE 0.9 % IV SOLN: INTRAVENOUS | Status: DC

## 2021-05-14 MED ORDER — POTASSIUM CHLORIDE 20 MEQ PO PACK
40.0000 meq | PACK | Freq: Once | ORAL | Status: DC
Start: 2021-05-14 — End: 2021-05-14

## 2021-05-14 MED ORDER — TECHNETIUM TC 99M-LABELED RED BLOOD CELLS IV KIT
20.0000 | PACK | Freq: Once | INTRAVENOUS | Status: AC | PRN
  Administered 2021-05-14: 23.25 via INTRAVENOUS

## 2021-05-14 MED ORDER — HEPARIN SODIUM (PORCINE) 1000 UNIT/ML IJ SOLN
INTRAMUSCULAR | Status: AC
  Filled 2021-05-14: qty 10

## 2021-05-14 MED ORDER — SODIUM CHLORIDE 0.9% IV SOLUTION: Freq: Once | INTRAVENOUS | Status: AC

## 2021-05-14 MED ORDER — SODIUM CHLORIDE 0.9% FLUSH: 10.0000 mL | INTRAVENOUS | Status: DC | PRN

## 2021-05-14 MED ORDER — SODIUM CHLORIDE 0.9% IV SOLUTION: Freq: Once | INTRAVENOUS | Status: DC

## 2021-05-14 NOTE — Progress Notes (Signed)
Hemodialysis Post Treatment Note: ?  ?Treatment date: 05/14/2021 ?  ?Treatment time:  3 hours  ?  ?Access:  CVC ?  ?UF Removed: -330 ml ?  ?Next Treatment: 05/17/21 ?  ?Patient started dialysis as ordered. Patient continue to experience asymptomatic tachycardia. Dr. Candiss Norse saw patient prior to treatment and decrease BFR to 250. Patient received 1 unit of blood during dialysis, patient tolerated well. Report given to floor nurse Karen Kitchens Rose Phi, RN. ?

## 2021-05-14 NOTE — Progress Notes (Signed)
? ?NAME:  Blake West, MRN:  [id number], DOB:  1950-11-08, LOS: 3 ?ADMISSION DATE:  05/11/2021, CONSULTATION DATE:  05/11/2021 ?REFERRING MD:  Dr. Alfred Levins, CHIEF COMPLAINT:  Melena  ? 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   ? ?History of Present Illness:  ?71 yo M presenting to [facility] ED from home via EMS with c/o black tarry stools for the past 2 days.  Most recent episode was earlier this morning. He also reports associated dyspnea and lightheadedness on exertion.  Also reported crampy abdominal discomfort just prior to bowel movements.  Patient is taking Eliquis for chronic Atrial fibrillation, also reports he takes aspirin occasionally for pain control.  He denies other history of GI bleeding or cirrhosis. He does admit to vomiting episodes, but denies coffee-ground emesis, hematemesis, or hematochezia. He denies abdominal pain/chest pain. Over the last several days he reports feeling short of breath with exertion and dizzy. ? ?Of note, patient was recently hospitalized at [facility] from 04/19/21 to 04/30/21 after being sent over from his nephrologist with lab work noting hyperkalemia and worsening renal function.  Patient found to have a creatinine of 15.  Chest x-ray also noted scattered nodular airspace disease concerning for pneumonia.  Patient admitted to the hospital service and started on antibiotics.  Nephrology consulted and work-up underway for vasculitis.  Renal function continued to decline and patient had dialysis catheter placed and started dialysis on 3/30.  Hospital course complicated by urinary retention which has since resolved and treatment of multifocal pneumonia. For ANCA-associated vasculitis, he received Solu-Medrol which was changed to p.o.  prednisone.  Started on rituximab with next dose planned in 10 days.  Seen by pulmonary and rheumatology.  On Bactrim 3 times a week for prophylaxis. ? ?ED Course: ?Per EDP report, rectal exam grossly positive with black stool that is guaiac positive upon testing. Patient pale, hypotensive in A-fib  ?Initial Vital Signs: 98, 20, 114, 84/47, 94% on RA ?Significant Labs: Glucose 138, BUN 107, creatinine 3.75, calcium 6.8, high-sensitivity troponin 63, lactic acid 5.0, WBC 17.7, hemoglobin 4.1, hematocrit 13.1, platelets 149, PT 21, INR 1.8 ?Imaging ?Chest X-ray>>There is interval placement of left IJ central venous catheter with ?its tip at the junction of left innominate vein and superior vena cava. There is improvement in aeration of right lower lung fields. Residual patchy infiltrates are seen in the left lower lung fields suggesting atelectasis/pneumonia. There are no signs of alveolar pulmonary edema. There is no pneumothorax. ?Medications Administered:  Protonix bolus started, 250 mL IVF bolus, Kcentra, 1 unit emergent PRBC's, levophed drip started ? ?PCCM asked to admit.  GI consulted. ? ?Pertinent  Medical History  ?HFrEF (LVEF 40-45%) ?ESRD on iHD ?ANCA-associated vasculitis ?Chronic Atrial Fibrillation on Eliquis ?HTN ?Hypothyroidism ?HLD ?CAD ?Gout ?T2DM ? ?Micro Data:  ?4/18: Blood culture x2 >>negative  ? ?Antimicrobials:  ?Cefepime 4/18>> ?Flagyl 4/18>> ?Vancomycin 4/18>>4/19 ? ?Significant Hospital Events: ?Including procedures, antibiotic start and stop dates in addition to other pertinent events   ?4/18: Presented to ED with melena, hemorrhagic shock.  PCCM asked to admit.  Will place central line. GI consulted.  Transfused 4 units of blood. ?4/19: Overnight with A.fib w/ RVR, rate better controlled s/p 150 mg Amiodarone bolus & 2.5 mg IV Metoprolol.  Weaned of Levophed.  Hemoglobin remains stable, will give additional 1 unit pRBC's with HD today.  GI plan for EGD tomorrow. D/c Vancomycin ?4/20: Pts  hgb 6.3 to receive 1 unit of pRBC's and undergo upper endoscopy  ?4/20: Upper endoscopy revealed the esophagus was normal. The stomach was normal. One non-bleeding cratered duodenal ulcer with a clean ulcer base (Forrest Class III) was found in the second portion of the duodenum. The lesion was 15 mm in largest dimension. ?4/21: Pt with episode of melena hgb 6.0 transfusing 1 unit pRBC's.  Contacting GI to determine if pt will need colonoscopy  ? ?Interim History / Subjective:  ?Pt with 1 episode of melena this am hgb 6.0.  Will receive 1 unit of pRBC's ? ?Objective   ?Blood pressure 117/88, pulse (!) 134, temperature 97.7 ?F (36.5 ?C), temperature source Oral, resp. rate 14, height $RemoveBe'5\' 11"'mCkZhNgWN$  (1.803 m), weight 94 kg, SpO2 93 %. ?   ?   ? ?Intake/Output Summary (Last 24 hours) at 05/14/2021 0914 ?Last data filed at 05/14/2021 2751 ?Gross per 24 hour  ?Intake 1741.82 ml  ?Output 1050 ml  ?Net 691.82 ml  ? ?Filed Weights  ? 05/12/21 1424 05/13/21 0453 05/14/21 0432  ?Weight: 94.4 kg 94.2 kg 94 kg  ? ? ?Examination: ?General: Acutely ill appearing male resting in bed, NAD  ?HEENT: Supple, no JVD ?Lungs: Clear throughout, even, non labored  ?Cardiovascular: NSR, rrr, no M/R/G, 2+ radial/1+ distal pulses present, 2+ bilateral lower extremity edema   ?Abdomen: +BS x4, obese, soft, non tender, non distended  ?Extremities: Normal bulk and tone,  ?Neuro: Alert and oriented, follows commands, PERRL  ?GU: Indwelling foley catheter draining yellow urine  ? ?Resolved Hospital Problem list   ?Hemorrhagic Shock  ? ?Assessment & Plan:  ? ?Acute Upper Gastrointestinal Bleed in the setting of chronic systemic anticoagulation ?Acute blood loss anemia ?PMHx: A-fib on Eliquis ?Eliquis reversed with Kcentra in ED, Protonix bolus started in ED, 1 unit of uncross-matched blood started in ED ?- Protonix gtt ?- Maintain map >65 ?- Continuous telemetry monitoring  ?- Clear liquids for now  ?- Monitor for s/s of bleeding ?- Daily CBC, PT/ INR  monitoring PRN (follow H&H q6h) ?- Transfuse for Hgb <8 ?- Hold outpatient eliquis ?- GI consulted appreciate input ?  ?Mildly elevated Troponin, suspect demand ischemia ?Atrial Fibrillation w/ RVR~improving  ?Chronic HFrEF without acute exacerbation ?PMHx: Chronic Atrial Fibrillation on Eliquis ?- Continuous cardiac monitoring ?- Maintain MAP >65 ?- Transfusions as indicated ?- Continue stress dose steroids given pt was previously on steroids for vasculitis ?- HS Troponin peaked at 347 ?- Echocardiogram 04/28/21: LVEF 40-45%, indeterminate diastolic parameters, RV systolic function low normal ?- Unable to anticoagulate due to GI bleed ?- Prn metoprolol for hr control ? ?Leukocytosis ?Met SIRS Criteria on admission (WBC 17.7, HR 114, Lactic 5.0) ?Recently treated for Multifocal Pneumonia 3/27-4/7, completed course of Azithromycin & Ceftriaxone, discharged home on Bactrim DS 3x per week ?- Monitor fever curve ?- Trend WBC's & Procalcitonin ?- Follow cultures as above ?- Continue empiric flagyl for now ? ?ESRD on iHD secondary to glomerulonephritis ?- Monitor I&O's / urinary output ?- Follow BMP ?- Ensure adequate renal perfusion ?- Avoid nephrotoxic agents as able ?- Replace electrolytes as indicated ?- Nephrology consulted appreciate input~HD per recommendations  ?  ?Best Practice (right click and "Reselect all SmartList Selections" daily)  ? ?Diet/type: Clear  ?DVT prophylaxis: SCD (no chemical prophylaxis/anticoagulation given GI bleed) ?GI prophylaxis: PPI ?Lines: Central line and yes and  it is still needed ?Foley:  N/A ?Code Status:  full code ?Last date of multidisciplinary goals of care discussion [05/14/21] ? ?Labs   ?CBC: ?Recent Labs  ?Lab 05/11/21 ?1537 05/11/21 ?1316 05/12/21 ?9432 05/12/21 ?7614 05/13/21 ?0532 05/13/21 ?1431 05/13/21 ?2031 05/14/21 ?0140 05/14/21 ?7092 05/14/21 ?9574  ?WBC 17.7*  --  18.8*  --  13.9*  --   --   --  9.9  --   ?NEUTROABS 13.7*  --   --   --   --   --   --   --   --   --    ?HGB 4.1*   < > 7.0*   < > 6.3* 7.1* 6.8* 7.0* 6.8* 6.0*  ?HCT 13.1*   < > 20.3*   < > 18.7* 21.1* 20.1* 20.7* 20.1* 18.0*  ?MCV 92.3  --  86.8  --  92.1  --   --   --  91.4  --   ?PLT 149*  --  103*  --  96*  -

## 2021-05-14 NOTE — Progress Notes (Signed)
?[facility]  ?ROUNDING NOTE  ? ?Subjective:  ? 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   ? ? Patient has been admitted for Hemorrhagic shock ([place]) [R57.8] ?GIB (gastrointestinal bleeding) [K92.2] ?Upper GI bleed [K92.2] ? ? ?Update: ?There are still concerns about bleeding ?Seen prior to starting dialysis ?HR irregular, tachycardic ? ? ?Objective:  ?Vital signs in last 24 hours:  ?Temp:  [97.7 ?F (36.5 ?C)-98.7 ?F (37.1 ?C)] 98.3 ?F (36.8 ?C) (04/21 1301) ?Pulse Rate:  [25-136] 112 (04/21 1400) ?Resp:  [11-25] 11 (04/21 1400) ?BP: (93-131)/(61-99) 129/79 (04/21 1400) ?SpO2:  [87 %-97 %] 93 % (04/21 1400) ?Weight:  [94 kg-96.6 kg] 96.4 kg (04/21 1301) ? ?Weight change: -0.8 kg ?Filed Weights  ? 05/14/21 0432 05/14/21 0918 05/14/21 1301  ?Weight: 94 kg 96.6 kg 96.4 kg  ? ? ?Intake/Output: ?I/O last 3 completed shifts: ?In: 2026.6 [P.O.:720; I.V.:516.7; Blood:340; Other:100; IV Piggyback:349.9] ?Out: 1595 [FAOZH:0865] ?  ?Intake/Output this shift: ? Total I/O ?In: 312 [Blood:312] ?Out: -52 [Urine:275] ? ?Physical Exam: ?General: NAD  ?Head: Normocephalic, atraumatic.    ?Eyes: Anicteric  ?Lungs:  Clear to auscultation, normal effort,  ?Heart: Irregular rate and rhythm  ?Abdomen:  Soft, nontender, nondistended  ?Extremities: + peripheral edema.  ?Neurologic: Alert, oriented    ?Skin: Warm, dry  ?Access: Rt permcath placed on 04/22/21 by Dr Lucky Cowboy  ? ? ?Basic Metabolic Panel: ?Recent Labs  ?Lab 05/11/21 ?7846 05/11/21 ?2147 05/12/21 ?9629 05/13/21 ?0532 05/14/21 ?0434  ?NA 136 135 134* 136 138  ?K 3.9 4.3 4.3 3.6 3.4*  ?CL 100 103 103 104 104  ?CO2 23 20* 20* 25 24  ?GLUCOSE 138* 113* 114* 125* 122*  ?BUN 107* 140* 146* 90* 112*  ?CREATININE 3.75* 4.57* 4.82* 3.68* 4.80*  ?CALCIUM 6.8* 7.0* 7.1* 6.8* 7.2*  ?MG 1.7  1.8 1.9 1.7 2.2  ?PHOS  --   --  5.0* 4.9* 5.5*  ? ? ? ?Liver Function Tests: ?Recent Labs  ?Lab 05/11/21 ?5284 05/12/21 ?1324  ?AST 23 26  ?ALT 18 35  ?ALKPHOS 31* 31*  ?BILITOT 0.5 1.2  ?PROT 3.7* 3.9*  ?ALBUMIN 1.8* 2.0*  ? ? ?No results for input(s): LIPASE, AMYLASE in the last 168 hours. ? ?No results for input(s): AMMONIA in the last 168 hours. ? ?CBC: ?Recent Labs  ?Lab 05/11/21 ?4010 05/11/21 ?1316 05/12/21 ?2725 05/12/21 ?3664 05/13/21 ?0532 05/13/21 ?1431 05/13/21 ?2031 05/14/21 ?0140 05/14/21 ?0434 05/14/21 ?4034 05/14/21 ?1323  ?WBC 17.7*  --  18.8*  --  13.9*  --   --   --  9.9  --   --   ?NEUTROABS 13.7*  --   --   --   --   --   --   --   --   --   --   ?HGB 4.1*   < > 7.0*   < > 6.3*   < > 6.8* 7.0* 6.8* 6.0* 7.4*  ?HCT 13.1*   < > 20.3*   < > 18.7*   < > 20.1* 20.7* 20.1* 18.0* 21.7*  ?MCV 92.3  --  86.8  --  92.1  --   --   --  91.4  --   --   ?PLT 149*  --  103*  --  96*  --   --   --  106*  --   --   ? < > =  values in this interval not displayed.  ? ? ? ?Cardiac Enzymes: ?No results for input(s): CKTOTAL, CKMB, CKMBINDEX, TROPONINI in the last 168 hours. ? ?BNP: ?Invalid input(s): POCBNP ? ?CBG: ?Recent Labs  ?Lab 05/11/21 ?0820  ?GLUCAP 95  ? ? ? ?Microbiology: ?Results for orders placed or performed during the hospital encounter of 05/11/21  ?Culture, blood (Routine X 2) w Reflex to ID Panel     Status: None (Preliminary result)  ? Collection Time: 05/11/21  1:16 PM  ? Specimen: BLOOD  ?Result Value Ref Range Status  ? Specimen Description BLOOD LEFT HAND  Final  ? Special Requests   Final  ?  BOTTLES DRAWN AEROBIC AND ANAEROBIC Blood Culture adequate volume  ? Culture   Final  ?  NO GROWTH 3 DAYS ?Performed at [facility], [address], [place], [place] [address] ?  ? Report Status PENDING  Incomplete  ?Culture, blood (Routine X 2) w Reflex to ID Panel     Status: None (Preliminary result)  ? Collection Time: 05/11/21  1:16 PM  ? Specimen: BLOOD  ?Result Value Ref Range Status  ?  Specimen Description BLOOD RIGHT HAND  Final  ? Special Requests   Final  ?  BOTTLES DRAWN AEROBIC ONLY Blood Culture results may not be optimal due to an inadequate volume of blood received in culture bottles  ? Culture   Final  ?  NO GROWTH 3 DAYS ?Performed at [facility], [address], [place], [place] [address] ?  ? Report Status PENDING  Incomplete  ?MRSA Next Gen by PCR, Nasal     Status: None  ? Collection Time: 05/11/21  7:00 PM  ? Specimen: Nasal Mucosa; Nasal Swab  ?Result Value Ref Range Status  ? MRSA by PCR Next Gen NOT DETECTED NOT DETECTED Final  ?  Comment: (NOTE) ?The GeneXpert MRSA Assay (FDA approved for NASAL specimens only), ?is one component of a comprehensive MRSA colonization surveillance ?program. It is not intended to diagnose MRSA infection nor to guide ?or monitor treatment for MRSA infections. ?Test performance is not FDA approved in patients less than 2 years ?old. ?Performed at [facility], [place], ?[place] [address] ?  ? ? ?Coagulation Studies: ?Recent Labs  ?  05/12/21 ?1601 05/13/21 ?0532  ?LABPROT 15.8* 15.7*  ?INR 1.3* 1.3*  ? ? ? ?Urinalysis: ?No results for input(s): COLORURINE, LABSPEC, [place], GLUCOSEU, HGBUR, BILIRUBINUR, KETONESUR, PROTEINUR, UROBILINOGEN, NITRITE, LEUKOCYTESUR in the last 72 hours. ? ?Invalid input(s): APPERANCEUR ?  ? ? ?Imaging: ?No results found. ? ? ?Medications:  ? ? lactated ringers Stopped (05/11/21 0932)  ? metronidazole Stopped (05/14/21 0016)  ? ? Chlorhexidine Gluconate Cloth  6 each Topical Q0600  ? feeding supplement  1 Container Oral TID BM  ? heparin sodium (porcine)      ? [START ON 05/15/2021] hydrocortisone sod succinate (SOLU-CORTEF) inj  100 mg Intravenous Daily  ? pantoprazole  40 mg Intravenous Q12H  ? ?docusate sodium, metoprolol tartrate, polyethylene glycol ? ?Assessment/ Plan:  ?[name] is a 71 y.o.  male with past medical conditions including hypertension, chronic heart failure  with reduced ejection fraction, biventricular heart failure, chronic kidney disease stage IV with glomerulonephritis.  Patient presents to the emergency department with complaints of nausea, vomiting, generalized decline.  Patient has been admitted for Hemorrhagic shock ([place]) [R57.8] ?GIB (gastrointestinal bleeding) [K92.2] ?Upper GI bleed [K92.2] ? ? ?Acute Kidney Injury with hyperkalemia on chronic kidney disease  stage IV with baseline creatinine 3.0 and GFR of 21 on 02/17/21.  ?AKI multifactorial with contributions from ANCA vasculitis ([facility]) (N01.7) and ATN.  ?  Chronic kidney disease is secondary to diabetes, NSAID use and hypertension.  ? ?CT abdomen pelvis negative for obstruction.  No exposure to IV contrast.  Patient received initial dialysis 04/22/21.  ?QuantiFERON-TB gold obtained on 04/22/21 ?Renal biopsy 04/26/21. : ANCA related disease, 38% fibrous-cellular crescents, 50% interstitial fibrosis with moderate to severe scarring and ATN.  ? ?Given rituximab 1 gm on 04/30/2021 ?Was on Prednisone 60 mg daily ?Bactrim DS three times per week ? ?- stress dose steroids at present. High BUN likely due to steroids ?- Dialysis MWF schedule ?- will wait and see about timing of next rituximab dose; once hemodynamically stable ?- HD today with slow BFR and no volume removal ? ? ?Lab Results  ?Component Value Date  ? CREATININE 4.80 (H) 05/14/2021  ? CREATININE 3.68 (H) 05/13/2021  ? CREATININE 4.82 (H) 05/12/2021  ? ? ?Intake/Output Summary (Last 24 hours) at 05/14/2021 1557 ?Last data filed at 05/14/2021 1300 ?Gross per 24 hour  ?Intake 1044.13 ml  ?Output 745 ml  ?Net 299.13 ml  ? ? ? ?2.   Anemia of acute GI blood loss and chronic kidney disease ?- blood transfusion as per icu team.  ?- continue EPO with HD. ?- EGD showed cratered duodenal ulcer (4/20) ? ?3.Marland Kitchen   Chronic systolic heart failure. And Atrial Fibrrilation ? Echo from 4//5/23 shows EF 40-45% with moderately decreased left ventricular function, global  hypokinesis   ? Rate controlled with metoprolol as outpatient ? ?4. LE edema ?Good UOP ?No fluid removal with HD ? ? ? ?  ? ? LOS: 3 ?Nadirah Socorro ?4/21/20233:57 PM ?  ?

## 2021-05-14 NOTE — Progress Notes (Signed)
PHARMACY CONSULT NOTE - FOLLOW UP ? ?Pharmacy Consult for Electrolyte Monitoring and Replacement  ? ?Recent Labs: ?Potassium (mmol/L)  ?Date Value  ?05/14/2021 3.4 (L)  ? ?Magnesium (mg/dL)  ?Date Value  ?05/14/2021 2.2  ? ?Calcium (mg/dL)  ?Date Value  ?05/14/2021 7.2 (L)  ? ?Albumin (g/dL)  ?Date Value  ?05/12/2021 2.0 (L)  ? ?Phosphorus (mg/dL)  ?Date Value  ?05/14/2021 5.5 (H)  ? ?Sodium (mmol/L)  ?Date Value  ?05/14/2021 138  ?06/26/2019 138  ? ?Corrected Calcium: 8.4 mg/dl ? ?Assessment: ?71 year old male presenting with melena. PMH includes ESRD on HD MWF, atrial fibrillation on Eliquis 5 mg BID (unknown last dose), glomerulonephritis, HFrEF EF 40-45%, hypothyroidism, hypertension, type 2 diabetes mellitus. Admitted with acute GI bleed.  ? ?Received KCentra 4500 units x 1 4/18 AM. EGD found duodenal ulcer. ? ?Goal of Therapy:  ?K 4-5 ?Mag > 2 ?All other electrolytes within normal limits ? ?Plan:  ?Receiving HD, therefore no replacement today. ?Follow up electrolytes in the AM ? ?Wynelle Cleveland, PharmD ?Pharmacy Resident  ?05/14/2021 ?7:12 AM ?

## 2021-05-14 NOTE — Anesthesia Preprocedure Evaluation (Addendum)
Anesthesia Evaluation  ?Patient identified by MRN, date of birth, ID band ?Patient awake ? ? ? ?Reviewed: ?Allergy & Precautions, H&P , NPO status , Patient's Chart, lab work & pertinent test results, reviewed documented beta blocker date and time  ? ?History of Anesthesia Complications ?Negative for: history of anesthetic complications ? ?Airway ?Mallampati: III ? ?TM Distance: >3 FB ?Neck ROM: full ? ? ? Dental ? ?(+) Dental Advidsory Given, Edentulous Upper, Edentulous Lower ?  ?Pulmonary ?neg shortness of breath, sleep apnea , pneumonia, COPD, neg recent URI, former smoker,  ?Recently treated for Multifocal Pneumonia 3/27-4/7, completed course of Azithromycin & Ceftriaxone, discharged home on Bactrim DS 3x per week ? ?Chest X-ray>>There is interval placement of left IJ central venous catheter with ?its tip at the junction of left innominate vein and superior vena cava. There is improvement in aeration of right lower lung fields. Residual patchy infiltrates are seen in the left lower lung fields suggesting atelectasis/pneumonia. There are no signs of alveolar pulmonary edema. There is no pneumothorax ? On [place] ? ?breath sounds clear to auscultation ? ? ? ? ? ? Cardiovascular ?Exercise Tolerance: Good ?hypertension, (-) angina+ CAD and +CHF  ?(-) Past MI and (-) Cardiac Stents + dysrhythmias Atrial Fibrillation (-) Valvular Problems/Murmurs ?Rhythm:regular Rate:Normal ? ?ANCA-associated vasculiti- he received Solu-Medrol which was changed to p.o. prednisone.  Started on rituximab  ? ?Mildly elevated Troponin, suspect demand ischemia- HS Troponin peaked at 347 ? ?Echocardiogram 04/28/21: LVEF 40-45%, indeterminate diastolic parameters, RV systolic function low normal ?  ?Neuro/Psych ?PSYCHIATRIC DISORDERS negative neurological ROS ?   ? GI/Hepatic ?negative GI ROS, Neg liver ROS,   ?Endo/Other  ?diabetes, Type 2Hypothyroidism Stress dose steroids at present ?Chronic steroid use ?  Renal/GU ?Dialysis and CRFRenal diseaseAcute Kidney Injury with hyperkalemia on chronic kidney disease stage IV  ? ?HD on 4/21  ?negative genitourinary ?  ?Musculoskeletal ? ? Abdominal ?(+) + obese,   ?Peds ? Hematology ? ?(+) Blood dyscrasia, anemia , EGD showed large non bleeding duodenal ulcer. Pt with continued drop in Hgb.   ?Anesthesia Other Findings ?Hypokalemia ? ?Past Medical History: ?No date: Abnormal EKG ?No date: Biventricular failure ([place]) ?No date: CAD (coronary artery disease) ?No date: Colorectal polyps ?No date: Gout ?No date: Hyperlipidemia LDL goal <70 ?No date: Hypertension ?No date: Hypothyroidism ?No date: Kidney stone ?No date: Pneumonia ?No date: Testicular hypofunction ?04/26/2019: Type 2 diabetes mellitus ([place]) ? ? Reproductive/Obstetrics ?negative OB ROS ? ?  ? ? ? ? ? ? ? ? ? ? ? ? ? ?  ?  ? ? ? ? ? ? ?Anesthesia Physical ? ?Anesthesia Plan ? ?ASA: 3 ? ?Anesthesia Plan: General  ? ?Post-op Pain Management: Minimal or no pain anticipated  ? ?Induction: Intravenous ? ?PONV Risk Score and Plan: 2 and Propofol infusion and TIVA ? ?Airway Management Planned: Natural Airway and Nasal Cannula ? ?Additional Equipment:  ? ?Intra-op Plan:  ? ?Post-operative Plan:  ? ?Informed Consent: I have reviewed the patients History and Physical, chart, labs and discussed the procedure including the risks, benefits and alternatives for the proposed anesthesia with the patient or authorized representative who has indicated his/her understanding and acceptance.  ? ? ? ?Dental advisory given ? ?Plan Discussed with: Anesthesiologist, CRNA and Surgeon ? ?Anesthesia Plan Comments:   ? ? ? ? ? ?Anesthesia Quick Evaluation ? ?

## 2021-05-14 NOTE — Progress Notes (Signed)
? ?  Jonathon Bellows , MD ?[address] [place], [place], [place], [address] ?[address] [place], [place], [place], [address] ?Phone: [phone]  ?Fax: [phone] ? ?Patient in radiology with nuclear scan : I havnt seen the patient today  ? ? ?Blake West is a 71 y.o. y/o male with with history of CKD, atrial fibrillation on Eliquis presented to the emergency room with a 1 day history of melena hemoglobin was 4 g.  Elevated BUN/creatinine ratio. H/o NSADI use, EGD showed large non bleeding duodenal ulcer, since yesterday further drop in HB to 6 grams ?  ?  ?Plan ?1.  2 large-bore IV cannulas.  Monitor CBC and transfuse as needed. ?2.  IV PPI gtt. ?3.  Hold Eliquis ?4. Discussed with ICU NP and patient going to have a tagged RBC scan.  If the tagged RBC scan shows a source of bleed would recommend vascular surgery to embolize the lesion if not would recommend to commence bowel prep for colonoscopy tomorrow ? ? ? LOS: 3 days  ? ?Jonathon Bellows, MD ?05/14/2021, 1:34 PM  ?

## 2021-05-15 ENCOUNTER — Encounter
Admission: EM | Disposition: A | Discharge status: Home Health | Payer: Self-pay | Source: Home / Self Care | Attending: Internal Medicine

## 2021-05-15 ENCOUNTER — Inpatient Hospital Stay: Payer: PPO | Admitting: Anesthesiology

## 2021-05-15 DIAGNOSIS — K921 Melena: Secondary | ICD-10-CM | POA: Diagnosis not present

## 2021-05-15 DIAGNOSIS — K264 Chronic or unspecified duodenal ulcer with hemorrhage: Secondary | ICD-10-CM

## 2021-05-15 DIAGNOSIS — K922 Gastrointestinal hemorrhage, unspecified: Secondary | ICD-10-CM | POA: Diagnosis not present

## 2021-05-15 DIAGNOSIS — R578 Other shock: Secondary | ICD-10-CM | POA: Diagnosis not present

## 2021-05-15 HISTORY — PX: COLONOSCOPY: SHX5424

## 2021-05-15 LAB — HEMOGLOBIN AND HEMATOCRIT, BLOOD
HCT: 25.5 % — ABNORMAL LOW (ref 39.0–52.0)
HCT: 27 % — ABNORMAL LOW (ref 39.0–52.0)
Hemoglobin: 8.7 g/dL — ABNORMAL LOW (ref 13.0–17.0)
Hemoglobin: 9 g/dL — ABNORMAL LOW (ref 13.0–17.0)

## 2021-05-15 LAB — BPAM RBC
Blood Product Expiration Date: 202304252359
Blood Product Expiration Date: 202305022359
Blood Product Expiration Date: 202305022359
Blood Product Expiration Date: 202305022359
Blood Product Expiration Date: 202305042359
Blood Product Expiration Date: 202305042359
Blood Product Expiration Date: 202305042359
Blood Product Expiration Date: 202305112359
ISSUE DATE / TIME: 202304180535
ISSUE DATE / TIME: 202304180815
ISSUE DATE / TIME: 202304181459
ISSUE DATE / TIME: 202304181728
ISSUE DATE / TIME: 202304191113
ISSUE DATE / TIME: 202304200834
ISSUE DATE / TIME: 202304211005
ISSUE DATE / TIME: 202304212056
Unit Type and Rh: 5100
Unit Type and Rh: 600
Unit Type and Rh: 600
Unit Type and Rh: 600
Unit Type and Rh: 600
Unit Type and Rh: 600
Unit Type and Rh: 600
Unit Type and Rh: 600

## 2021-05-15 LAB — BASIC METABOLIC PANEL
Anion gap: 8 (ref 5–15)
BUN: 61 mg/dL — ABNORMAL HIGH (ref 8–23)
CO2: 26 mmol/L (ref 22–32)
Calcium: 7.3 mg/dL — ABNORMAL LOW (ref 8.9–10.3)
Chloride: 102 mmol/L (ref 98–111)
Creatinine, Ser: 3.59 mg/dL — ABNORMAL HIGH (ref 0.61–1.24)
GFR, Estimated: 17 mL/min — ABNORMAL LOW (ref >60)
Glucose, Bld: 88 mg/dL (ref 70–99)
Potassium: 3 mmol/L — ABNORMAL LOW (ref 3.5–5.1)
Sodium: 136 mmol/L (ref 135–145)

## 2021-05-15 LAB — TYPE AND SCREEN
ABO/RH(D): A NEG
ABO/RH(D): A NEG
Antibody Screen: NEGATIVE
Antibody Screen: NEGATIVE
Unit division: 0
Unit division: 0
Unit division: 0
Unit division: 0
Unit division: 0
Unit division: 0
Unit division: 0
Unit division: 0

## 2021-05-15 LAB — CBC WITH DIFFERENTIAL/PLATELET
Abs Immature Granulocytes: 0.09 10*3/uL — ABNORMAL HIGH (ref 0.00–0.07)
Basophils Absolute: 0 10*3/uL (ref 0.0–0.1)
Basophils Relative: 0 %
Eosinophils Absolute: 0.3 10*3/uL (ref 0.0–0.5)
Eosinophils Relative: 3 %
HCT: 26.9 % — ABNORMAL LOW (ref 39.0–52.0)
Hemoglobin: 9 g/dL — ABNORMAL LOW (ref 13.0–17.0)
Immature Granulocytes: 1 %
Lymphocytes Relative: 14 %
Lymphs Abs: 1.1 10*3/uL (ref 0.7–4.0)
MCH: 30.7 pg (ref 26.0–34.0)
MCHC: 33.5 g/dL (ref 30.0–36.0)
MCV: 91.8 fL (ref 80.0–100.0)
Monocytes Absolute: 0.5 10*3/uL (ref 0.1–1.0)
Monocytes Relative: 6 %
Neutro Abs: 5.8 10*3/uL (ref 1.7–7.7)
Neutrophils Relative %: 76 %
Platelets: 104 10*3/uL — ABNORMAL LOW (ref 150–400)
RBC: 2.93 MIL/uL — ABNORMAL LOW (ref 4.22–5.81)
RDW: 15.5 % (ref 11.5–15.5)
WBC: 7.7 10*3/uL (ref 4.0–10.5)
nRBC: 1.7 % — ABNORMAL HIGH (ref 0.0–0.2)

## 2021-05-15 LAB — IRON AND TIBC
Iron: 76 ug/dL (ref 45–182)
Saturation Ratios: 46 % — ABNORMAL HIGH (ref 17.9–39.5)
TIBC: 165 ug/dL — ABNORMAL LOW (ref 250–450)
UIBC: 89 ug/dL

## 2021-05-15 LAB — PHOSPHORUS: Phosphorus: 4.4 mg/dL (ref 2.5–4.6)

## 2021-05-15 LAB — FERRITIN: Ferritin: 973 ng/mL — ABNORMAL HIGH (ref 24–336)

## 2021-05-15 LAB — FOLATE: Folate: 7.9 ng/mL (ref >5.9)

## 2021-05-15 LAB — MAGNESIUM: Magnesium: 2 mg/dL (ref 1.7–2.4)

## 2021-05-15 LAB — GLUCOSE, CAPILLARY: Glucose-Capillary: 71 mg/dL (ref 70–99)

## 2021-05-15 SURGERY — COLONOSCOPY WITH PROPOFOL
Anesthesia: General

## 2021-05-15 SURGERY — COLONOSCOPY
Anesthesia: General

## 2021-05-15 MED ORDER — PANTOPRAZOLE SODIUM 40 MG PO TBEC
40.0000 mg | DELAYED_RELEASE_TABLET | Freq: Two times a day (BID) | ORAL | Status: DC
Start: 2021-05-16 — End: 2021-05-18
  Administered 2021-05-16 – 2021-05-18 (×4): 40 mg via ORAL
  Filled 2021-05-15 (×4): qty 1

## 2021-05-15 MED ORDER — SODIUM CHLORIDE 0.9 % IV SOLN: INTRAVENOUS | Status: DC

## 2021-05-15 MED ORDER — PROPOFOL 500 MG/50ML IV EMUL
INTRAVENOUS | Status: DC | PRN
  Administered 2021-05-15: 50 mg via INTRAVENOUS
  Administered 2021-05-15: 100 ug/kg/min via INTRAVENOUS
  Administered 2021-05-15: 50 mg via INTRAVENOUS

## 2021-05-15 MED ORDER — LIDOCAINE HCL (CARDIAC) PF 100 MG/5ML IV SOSY
PREFILLED_SYRINGE | INTRAVENOUS | Status: DC | PRN
  Administered 2021-05-15: 50 mg via INTRAVENOUS

## 2021-05-15 MED ORDER — POTASSIUM CHLORIDE CRYS ER 20 MEQ PO TBCR
20.0000 meq | EXTENDED_RELEASE_TABLET | Freq: Once | ORAL | Status: AC
  Administered 2021-05-15: 20 meq via ORAL
  Filled 2021-05-15: qty 1

## 2021-05-15 NOTE — Progress Notes (Signed)
IV Team consult to remove central line. Notified primary RN, Karl Luke. He is aware and will remove line per order ?

## 2021-05-15 NOTE — Transfer of Care (Signed)
Immediate Anesthesia Transfer of Care Note ? ?Patient: Blake West ? ?Procedure(s) Performed: COLONOSCOPY ? ?Patient Location: PACU and Endoscopy Unit ? ?Anesthesia Type:MAC ? ?Level of Consciousness: awake, alert  and oriented ? ?Airway & Oxygen Therapy: Patient Spontanous Breathing and Patient connected to nasal cannula oxygen ? ?Post-op Assessment: Report given to RN and Post -op Vital signs reviewed and stable ? ?Post vital signs: Reviewed and stable ? ?Last Vitals:  ?Vitals Value Taken Time  ?BP 86/66 1011  ?Temp    ?Pulse 127 05/15/21 1012  ?Resp 18 05/15/21 1012  ?SpO2 94 % 05/15/21 1012  ?Vitals shown include unvalidated device data. ? ?Last Pain:  ?Vitals:  ? 05/15/21 0800  ?TempSrc: Oral  ?PainSc:   ?   ? ?Patients Stated Pain Goal: 0 (05/15/21 0300) ? ?Complications: No notable events documented. ?

## 2021-05-15 NOTE — Anesthesia Postprocedure Evaluation (Signed)
Anesthesia Post Note ? ?Patient: Blake West ? ?Procedure(s) Performed: COLONOSCOPY ? ?Patient location during evaluation: Endoscopy ?Anesthesia Type: General ?Level of consciousness: awake and alert ?Pain management: pain level controlled ?Vital Signs Assessment: post-procedure vital signs reviewed and stable ?Respiratory status: spontaneous breathing, nonlabored ventilation, respiratory function stable and patient connected to nasal cannula oxygen ?Cardiovascular status: blood pressure returned to baseline and stable ?Postop Assessment: no apparent nausea or vomiting ?Anesthetic complications: no ? ? ?No notable events documented. ? ? ?Last Vitals:  ?Vitals:  ? 05/15/21 1200 05/15/21 1300  ?BP: 119/83 110/71  ?Pulse: 100 (!) 107  ?Resp: 17 20  ?Temp:    ?SpO2: 97% 93%  ?  ?Last Pain:  ?Vitals:  ? 05/15/21 1032  ?TempSrc:   ?PainSc: 0-No pain  ? ? ?  ?  ?  ?  ?  ?  ? ?[place] Ouch ? ? ? ? ?

## 2021-05-15 NOTE — Progress Notes (Signed)
Pt transported to colonoscopy via bed accompanied by RN's from department.  ?

## 2021-05-15 NOTE — Progress Notes (Signed)
Pt returned from colonoscopy procedure in NAD. Alert and oriented; wants to eat. VSS.  ?

## 2021-05-15 NOTE — Progress Notes (Signed)
? ?NAME:  Blake West, MRN:  [id number], DOB:  March 20, 1950, LOS: 4 ?ADMISSION DATE:  05/11/2021, CONSULTATION DATE:  05/11/2021 ?REFERRING MD:  Dr. Alfred Levins, CHIEF COMPLAINT:  Melena  ? 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   ? ?History of Present Illness:  ?71 yo M presenting to [facility] ED from home via EMS with c/o black tarry stools for the past 2 days.  Most recent episode was earlier this morning. He also reports associated dyspnea and lightheadedness on exertion.  Also reported crampy abdominal discomfort just prior to bowel movements.  Patient is taking Eliquis for chronic Atrial fibrillation, also reports he takes aspirin occasionally for pain control.  He denies other history of GI bleeding or cirrhosis. He does admit to vomiting episodes, but denies coffee-ground emesis, hematemesis, or hematochezia. He denies abdominal pain/chest pain. Over the last several days he reports feeling short of breath with exertion and dizzy. ? ?Of note, patient was recently hospitalized at [facility] from 04/19/21 to 04/30/21 after being sent over from his nephrologist with lab work noting hyperkalemia and worsening renal function.  Patient found to have a creatinine of 15.  Chest x-ray also noted scattered nodular airspace disease concerning for pneumonia.  Patient admitted to the hospital service and started on antibiotics.  Nephrology consulted and work-up underway for vasculitis.  Renal function continued to decline and patient had dialysis catheter placed and started dialysis on 3/30.  Hospital course complicated by urinary retention which has since resolved and treatment of multifocal pneumonia. For ANCA-associated vasculitis, he received Solu-Medrol which was changed to p.o.  prednisone.  Started on rituximab with next dose planned in 10 days.  Seen by pulmonary and rheumatology.  On Bactrim 3 times a week for prophylaxis. ? ?ED Course: ?Per EDP report, rectal exam grossly positive with black stool that is guaiac positive upon testing. Patient pale, hypotensive in A-fib  ?Initial Vital Signs: 98, 20, 114, 84/47, 94% on RA ?Significant Labs: Glucose 138, BUN 107, creatinine 3.75, calcium 6.8, high-sensitivity troponin 63, lactic acid 5.0, WBC 17.7, hemoglobin 4.1, hematocrit 13.1, platelets 149, PT 21, INR 1.8 ?Imaging ?Chest X-ray>>There is interval placement of left IJ central venous catheter with ?its tip at the junction of left innominate vein and superior vena cava. There is improvement in aeration of right lower lung fields. Residual patchy infiltrates are seen in the left lower lung fields suggesting atelectasis/pneumonia. There are no signs of alveolar pulmonary edema. There is no pneumothorax. ?Medications Administered:  Protonix bolus started, 250 mL IVF bolus, Kcentra, 1 unit emergent PRBC's, levophed drip started ? ?PCCM asked to admit.  GI consulted. ? ?Pertinent  Medical History  ?HFrEF (LVEF 40-45%) ?ESRD on iHD ?ANCA-associated vasculitis ?Chronic Atrial Fibrillation on Eliquis ?HTN ?Hypothyroidism ?HLD ?CAD ?Gout ?T2DM ? ?Micro Data:  ?4/18: Blood culture x2 >>negative  ? ?Antimicrobials:  ?Cefepime 4/18>> ?Flagyl 4/18>> ?Vancomycin 4/18>>4/19 ? ?Significant Hospital Events: ?Including procedures, antibiotic start and stop dates in addition to other pertinent events   ?4/18: Presented to ED with melena, hemorrhagic shock.  PCCM asked to admit.  Will place central line. GI consulted.  Transfused 4 units of blood. ?4/19: Overnight with A.fib w/ RVR, rate better controlled s/p 150 mg Amiodarone bolus & 2.5 mg IV Metoprolol.  Weaned of Levophed.  Hemoglobin remains stable, will give additional 1 unit pRBC's with HD today.  GI plan for EGD tomorrow. D/c Vancomycin ?4/20: Pts  hgb 6.3 to receive 1 unit of pRBC's and undergo upper endoscopy  ?4/20: Upper endoscopy revealed the esophagus was normal. The stomach was normal. One non-bleeding cratered duodenal ulcer with a clean ulcer base (Forrest Class III) was found in the second portion of the duodenum. The lesion was 15 mm in largest dimension. ?4/21: Pt with episode of melena hgb 6.0 transfusing 1 unit pRBC's.  Contacting GI to determine if pt will need colonoscopy  ?4/21: NM GI Blood Loss Study revealed no evidence of active GI bleed. ?4/22: Pt required 1 unit of pRBC's overnight.  Colonoscopy scheduled for today  ? ?Interim History / Subjective:  ?Pt had a drop in hgb overnight 6.6 requiring 1 unit of pRBC's with hgb increasing to 9.0 ? ?Objective   ?Blood pressure (!) 125/100, pulse (!) 114, temperature 98 ?F (36.7 ?C), temperature source Oral, resp. rate (!) 31, height 5' 11"  (1.803 m), weight 96.4 kg, SpO2 93 %. ?CVP:  [1 mmHg-2 mmHg] 1 mmHg  ?   ? ?Intake/Output Summary (Last 24 hours) at 05/15/2021 0934 ?Last data filed at 05/15/2021 0830 ?Gross per 24 hour  ?Intake 5771.17 ml  ?Output 345 ml  ?Net 5426.17 ml  ? ?Filed Weights  ? 05/14/21 0432 05/14/21 0918 05/14/21 1301  ?Weight: 94 kg 96.6 kg 96.4 kg  ? ? ?Examination: ?General: Acutely ill appearing male resting in bed, NAD  ?HEENT: Supple, no JVD ?Lungs: Clear throughout, even, non labored  ?Cardiovascular: NSR, rrr, no M/R/G, 2+ radial/1+ distal pulses present, 2+ bilateral lower extremity edema   ?Abdomen: +BS x4, obese, soft, non tender, non distended  ?Extremities: Normal bulk and tone,  ?Neuro: Alert and oriented, follows commands, PERRL  ?GU: Indwelling foley catheter draining yellow urine  ? ?Resolved Hospital Problem list   ?Hemorrhagic Shock  ? ?Assessment & Plan:  ? ?Acute Upper Gastrointestinal Bleed in the setting of chronic systemic anticoagulation ?Acute blood loss anemia ?PMHx: A-fib on Eliquis ?Eliquis reversed with Kcentra in ED, Protonix bolus started in ED, 1  unit of uncross-matched blood started in ED ?- Protonix gtt ?- Maintain map >65 ?- Continuous telemetry monitoring  ?- Clear liquids for now  ?- Monitor for s/s of bleeding ?- Daily CBC, PT/ INR monitoring PRN (follow H&H q6h) ?- Transfuse for Hgb <8 ?- Hold outpatient eliquis ?- GI consulted appreciate input~pt to undergo Colonoscopy today 04/22 ?  ?Mildly elevated Troponin, suspect demand ischemia ?Atrial Fibrillation w/ RVR~improving  ?Chronic HFrEF without acute exacerbation ?PMHx: Chronic Atrial Fibrillation on Eliquis ?- Continuous cardiac monitoring ?- Maintain MAP >65 ?- Transfusions as indicated ?- Continue stress dose steroids given pt was previously on steroids for vasculitis ?- HS Troponin peaked at 347 ?- Echocardiogram 04/28/21: LVEF 40-45%, indeterminate diastolic parameters, RV systolic function low normal ?- Unable to anticoagulate due to GI bleed ?- Prn metoprolol for hr control ? ?Leukocytosis ?Met SIRS Criteria on admission (WBC 17.7, HR 114, Lactic 5.0) ?Recently treated for Multifocal Pneumonia 3/27-4/7, completed course of Azithromycin & Ceftriaxone, discharged home on Bactrim DS 3x per week ?- Monitor fever curve ?- Trend WBC's & Procalcitonin ?- Follow cultures as above ?- Continue empiric flagyl for now ? ?ESRD on iHD secondary to glomerulonephritis ?- Monitor I&O's / urinary output ?- Follow BMP ?- Ensure adequate renal perfusion ?- Avoid nephrotoxic agents as able ?- Replace electrolytes as indicated ?- Nephrology consulted  appreciate input~HD per recommendations  ?  ?Best Practice (right click and "Reselect all SmartList Selections" daily)  ? ?Diet/type: NPO ?DVT prophylaxis: SCD (no chemical prophylaxis/anticoagulation given GI bleed) ?GI prophylaxis: PPI ?Lines: Central line and yes and it is still needed ?Foley:  N/A ?Code Status:  full code ?Last date of multidisciplinary goals of care discussion [05/14/21] ? ?Labs   ?CBC: ?Recent Labs  ?Lab 05/11/21 ?8296 05/11/21 ?1316 05/12/21 ?0390  05/12/21 ?5646 05/13/21 ?0532 05/13/21 ?1431 05/14/21 ?0434 05/14/21 ?0744 05/14/21 ?1323 05/14/21 ?1900 05/15/21 ?0113 05/15/21 ?0422  ?WBC 17.7*  --  18.8*  --  13.9*  --  9.9  --   --   --  7.7  --

## 2021-05-15 NOTE — Progress Notes (Signed)
PHARMACY CONSULT NOTE - FOLLOW UP ? ?Pharmacy Consult for Electrolyte Monitoring and Replacement  ? ?Recent Labs: ?Potassium (mmol/L)  ?Date Value  ?05/15/2021 3.0 (L)  ? ?Magnesium (mg/dL)  ?Date Value  ?05/15/2021 2.0  ? ?Calcium (mg/dL)  ?Date Value  ?05/15/2021 7.3 (L)  ? ?Albumin (g/dL)  ?Date Value  ?05/12/2021 2.0 (L)  ? ?Phosphorus (mg/dL)  ?Date Value  ?05/15/2021 4.4  ? ?Sodium (mmol/L)  ?Date Value  ?05/15/2021 136  ?06/26/2019 138  ? ?Corrected Calcium: 8.4 mg/dl ? ?Assessment: ?71 year old male presenting with melena. PMH includes ESRD on HD MWF, atrial fibrillation on Eliquis 5 mg BID (unknown last dose), glomerulonephritis, HFrEF EF 40-45%, hypothyroidism, hypertension, type 2 diabetes mellitus. Admitted with acute GI bleed.  ? ?Goal of Therapy:  ?K 4-5 ?Mag > 2 ?All other electrolytes within normal limits ? ?Plan:  ?Will give Kcl 20 mEq PO x 1.  ?Follow up electrolytes in the AM ? ?Oswald Hillock, PharmD ?05/15/2021 ?8:44 AM ?

## 2021-05-15 NOTE — Progress Notes (Signed)
?[facility]  ?ROUNDING NOTE  ? ?Subjective:  ? 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   ? ? Patient has been admitted for Hemorrhagic shock ([place]) [R57.8] ?GIB (gastrointestinal bleeding) [K92.2] ?Upper GI bleed [K92.2] ? ? ?Update: ?Patient resting comfortably, wife, brother, sister-in-law at bedside ?Alert and oriented ?Appears to be in good spirits ?Nuclear med Bleeding scan negative for active bleeding ? ? ?Objective:  ?Vital signs in last 24 hours:  ?Temp:  [97 ?F (36.1 ?C)-98.1 ?F (36.7 ?C)] 97 ?F (36.1 ?C) (04/22 1012) ?Pulse Rate:  [52-142] 103 (04/22 1100) ?Resp:  [11-31] 18 (04/22 1100) ?BP: (86-147)/(66-109) 147/82 (04/22 1100) ?SpO2:  [90 %-97 %] 97 % (04/22 1100) ? ?Weight change: 2.6 kg ?Filed Weights  ? 05/14/21 0432 05/14/21 0918 05/14/21 1301  ?Weight: 94 kg 96.6 kg 96.4 kg  ? ? ?Intake/Output: ?I/O last 3 completed shifts: ?In: 5136.1 [P.O.:4000; I.V.:164.1; Blood:672; IV Piggyback:300] ?Out: 795 [Urine:1125] ?  ?Intake/Output this shift: ? Total I/O ?In: 991.1 [I.V.:90.3; Blood:857.5; IV Piggyback:43.3] ?Out: 150 [Urine:150] ? ?Physical Exam: ?General: NAD  ?Head: Normocephalic, atraumatic.    ?Eyes: Anicteric  ?Lungs:  Clear to auscultation, normal effort,  ?Heart: Irregular rate and rhythm  ?Abdomen:  Soft, nontender, nondistended  ?Extremities: + peripheral edema.  ?Neurologic: Alert, oriented    ?Skin: Warm, dry  ?Access: Rt permcath placed on 04/22/21 by Dr Lucky Cowboy  ? ? ?Basic Metabolic Panel: ?Recent Labs  ?Lab 05/11/21 ?2147 05/12/21 ?1308 05/13/21 ?0532 05/14/21 ?6578 05/15/21 ?0113  ?NA 135 134* 136 138 136  ?K 4.3 4.3 3.6 3.4* 3.0*  ?CL 103 103 104 104 102  ?CO2 20* 20* 25 24 26   ?GLUCOSE 113* 114* 125* 122* 88  ?BUN 140* 146* 90* 112* 61*  ?CREATININE 4.57* 4.82* 3.68* 4.80* 3.59*   ?CALCIUM 7.0* 7.1* 6.8* 7.2* 7.3*  ?MG 1.8 1.9 1.7 2.2 2.0  ?PHOS  --  5.0* 4.9* 5.5* 4.4  ? ? ? ?Liver Function Tests: ?Recent Labs  ?Lab 05/11/21 ?4696 05/12/21 ?2952  ?AST 23 26  ?ALT 18 35  ?ALKPHOS 31* 31*  ?BILITOT 0.5 1.2  ?PROT 3.7* 3.9*  ?ALBUMIN 1.8* 2.0*  ? ? ?No results for input(s): LIPASE, AMYLASE in the last 168 hours. ? ?No results for input(s): AMMONIA in the last 168 hours. ? ?CBC: ?Recent Labs  ?Lab 05/11/21 ?8413 05/11/21 ?1316 05/12/21 ?2440 05/12/21 ?1027 05/13/21 ?0532 05/13/21 ?1431 05/14/21 ?0434 05/14/21 ?0744 05/14/21 ?1323 05/14/21 ?1900 05/15/21 ?0113 05/15/21 ?0422  ?WBC 17.7*  --  18.8*  --  13.9*  --  9.9  --   --   --  7.7  --   ?NEUTROABS 13.7*  --   --   --   --   --   --   --   --   --  5.8  --   ?HGB 4.1*   < > 7.0*   < > 6.3*   < > 6.8* 6.0* 7.4* 6.6* 9.0* 8.7*  ?HCT 13.1*   < > 20.3*   < > 18.7*   < > 20.1* 18.0* 21.7* 19.2* 26.9* 25.5*  ?MCV 92.3  --  86.8  --  92.1  --  91.4  --   --   --  91.8  --   ?PLT 149*  --  103*  --  96*  --  106*  --   --   --  104*  --   ? < > = values in this interval not displayed.  ? ? ? ?Cardiac Enzymes: ?No results for input(s): CKTOTAL, CKMB, CKMBINDEX, TROPONINI in the last 168 hours. ? ?BNP: ?Invalid input(s): POCBNP ? ?CBG: ?Recent Labs  ?Lab 05/11/21 ?0820 05/15/21 ?7124  ?GLUCAP 95 71  ? ? ? ?Microbiology: ?Results for orders placed or performed during the hospital encounter of 05/11/21  ?Culture, blood (Routine X 2) w Reflex to ID Panel     Status: None (Preliminary result)  ? Collection Time: 05/11/21  1:16 PM  ? Specimen: BLOOD  ?Result Value Ref Range Status  ? Specimen Description BLOOD LEFT HAND  Final  ? Special Requests   Final  ?  BOTTLES DRAWN AEROBIC AND ANAEROBIC Blood Culture adequate volume  ? Culture   Final  ?  NO GROWTH 4 DAYS ?Performed at [facility], [address], [place], [place] [address] ?  ? Report Status PENDING  Incomplete  ?Culture, blood (Routine X 2) w Reflex to ID Panel     Status: None  (Preliminary result)  ? Collection Time: 05/11/21  1:16 PM  ? Specimen: BLOOD  ?Result Value Ref Range Status  ? Specimen Description BLOOD RIGHT HAND  Final  ? Special Requests   Final  ?  BOTTLES DRAWN AEROBIC ONLY Blood Culture results may not be optimal due to an inadequate volume of blood received in culture bottles  ? Culture   Final  ?  NO GROWTH 4 DAYS ?Performed at [facility], [address] [place], [place] [address] ?  ? Report Status PENDING  Incomplete  ?MRSA Next Gen by PCR, Nasal     Status: None  ? Collection Time: 05/11/21  7:00 PM  ? Specimen: Nasal Mucosa; Nasal Swab  ?Result Value Ref Range Status  ? MRSA by PCR Next Gen NOT DETECTED NOT DETECTED Final  ?  Comment: (NOTE) ?The GeneXpert MRSA Assay (FDA approved for NASAL specimens only), ?is one component of a comprehensive MRSA colonization surveillance ?program. It is not intended to diagnose MRSA infection nor to guide ?or monitor treatment for MRSA infections. ?Test performance is not FDA approved in patients less than 2 years ?old. ?Performed at [facility], [place], ?[place] [address] ?  ? ? ?Coagulation Studies: ?Recent Labs  ?  05/13/21 ?0532  ?LABPROT 15.7*  ?INR 1.3*  ? ? ? ?Urinalysis: ?No results for input(s): COLORURINE, LABSPEC, [place], GLUCOSEU, HGBUR, BILIRUBINUR, KETONESUR, PROTEINUR, UROBILINOGEN, NITRITE, LEUKOCYTESUR in the last 72 hours. ? ?Invalid input(s): APPERANCEUR ?  ? ? ?Imaging: ?NM GI Blood Loss ? ?Result Date: 05/14/2021 ?CLINICAL DATA:  GI bleed EXAM: NUCLEAR MEDICINE GASTROINTESTINAL BLEEDING SCAN TECHNIQUE: Sequential abdominal images were obtained following intravenous administration of Tc-41m labeled red blood cells. RADIOPHARMACEUTICALS:  23.25 mCi Tc-80m pertechnetate in-vitro labeled red cells. COMPARISON:  None. FINDINGS: No suspicious scintigraphic activity to suggest GI bleed. Uptake seen in the lower pelvis is likely penile blood pool activity. IMPRESSION: No  evidence of active GI bleed. Electronically Signed   By: Yetta Glassman M.D.   On: 05/14/2021 16:31   ? ? ?Medications:  ? ? sodium chloride Stopped (05/15/21 1128)  ? metronidazole 100 mL/hr at 05/15/21 1159  ? ? sodium chloride   Intravenous Once  ? Chlorhexidine Gluconate Cloth  6 each Topical Q0600  ? hydrocortisone sod succinate (SOLU-CORTEF) inj  100 mg Intravenous Daily  ? [START ON 05/16/2021] pantoprazole  40 mg Oral BID  ?  pantoprazole  40 mg Intravenous Q12H  ? potassium chloride  20 mEq Oral Once  ? sodium chloride flush  10-40 mL Intracatheter Q12H  ? ?docusate sodium, metoprolol tartrate, polyethylene glycol, sodium chloride flush ? ?Assessment/ Plan:  ?Mr. DANTRE YEARWOOD is a 71 y.o.  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  Patient presents to the emergency department with complaints of nausea, vomiting, generalized decline.  Patient has been admitted for Hemorrhagic shock ([place]) [R57.8] ?GIB (gastrointestinal bleeding) [K92.2] ?Upper GI bleed [K92.2] ? ?[place], MWF, right PermCath ? ?Acute Kidney Injury with hyperkalemia on chronic kidney disease stage IV with baseline creatinine 3.0 and GFR of 21 on 02/17/21.  ?AKI multifactorial with contributions from ANCA vasculitis ([facility]) (N01.7) and ATN.  ?  Chronic kidney disease is secondary to diabetes, NSAID use and hypertension.  ? ?CT abdomen pelvis negative for obstruction.  No exposure to IV contrast.  Patient received initial dialysis 04/22/21.  ?QuantiFERON-TB gold obtained on 04/22/21 ?Renal biopsy 04/26/21. : ANCA related disease, 38% fibrous-cellular crescents, 50% interstitial fibrosis with moderate to severe scarring and ATN.  ? ?Given rituximab 1 gm on 04/30/2021 ?Was on Prednisone 60 mg daily ?Bactrim DS three times per week ? ?-BUN may be elevated due to steroid  use. ?-Patient due for next dose of rituximab early this week, will determine if this can be completed inpatient. ?-Dialysis received yesterday with lowed BFR due to tachycardia. ? ? ?Lab Results  ?Component Value Date  ? CRE

## 2021-05-15 NOTE — Op Note (Signed)
[facility] ?Gastroenterology ?Patient Name: Blake West ?Procedure Date: 05/15/2021 8:56 AM ?MRN: [id number] ?Account #: [id number] ?Date of Birth: 30-Aug-1950 ?Admit Type: Inpatient ?Age: 71 ?Room: [facility] ENDO ROOM 4 ?Gender: Male ?Note Status: Finalized ?Instrument Name: Colonoscope [id number] ?Procedure:             Colonoscopy ?Providers:             Lin Landsman MD, MD ?Medicines:             General Anesthesia ?Complications:         No immediate complications. Estimated blood loss: None. ?Procedure:             Pre-Anesthesia Assessment: ?                       - Prior to the procedure, a History and Physical was  ?                       performed, and patient medications and allergies were  ?                       reviewed. The patient is competent. The risks and  ?                       benefits of the procedure and the sedation options and  ?                       risks were discussed with the patient. All questions  ?                       were answered and informed consent was obtained.  ?                       Patient identification and proposed procedure were  ?                       verified by the physician, the nurse, the  ?                       anesthesiologist, the anesthetist and the technician  ?                       in the pre-procedure area in the procedure room in the  ?                       endoscopy suite. Mental Status Examination: alert and  ?                       oriented. Airway Examination: normal oropharyngeal  ?                       airway and neck mobility. Respiratory Examination:  ?                       clear to auscultation. CV Examination: normal.  ?                       Prophylactic Antibiotics: The patient does not require  ?  prophylactic antibiotics. Prior Anticoagulants: The  ?                       patient has taken no previous anticoagulant or  ?                       antiplatelet agents. ASA Grade Assessment: III - A  ?                        patient with severe systemic disease. After reviewing  ?                       the risks and benefits, the patient was deemed in  ?                       satisfactory condition to undergo the procedure. The  ?                       anesthesia plan was to use general anesthesia.  ?                       Immediately prior to administration of medications,  ?                       the patient was re-assessed for adequacy to receive  ?                       sedatives. The heart rate, respiratory rate, oxygen  ?                       saturations, blood pressure, adequacy of pulmonary  ?                       ventilation, and response to care were monitored  ?                       throughout the procedure. The physical status of the  ?                       patient was re-assessed after the procedure. ?                       After obtaining informed consent, the colonoscope was  ?                       passed under direct vision. Throughout the procedure,  ?                       the patient's blood pressure, pulse, and oxygen  ?                       saturations were monitored continuously. The  ?                       Colonoscope was introduced through the anus and  ?                       advanced to the the cecum, identified by appendiceal  ?  orifice and ileocecal valve. The colonoscopy was  ?                       unusually difficult due to significant looping and the  ?                       patient's body habitus. Successful completion of the  ?                       procedure was aided by applying abdominal pressure.  ?                       The quality of the bowel preparation was adequate. ?Findings: ?     The perianal and digital rectal examinations were normal. Pertinent  ?     negatives include normal sphincter tone and no palpable rectal lesions. ?     Multiple diverticula were found in the recto-sigmoid colon and sigmoid  ?     colon. ?     Non-bleeding external  hemorrhoids were found during retroflexion. The  ?     hemorrhoids were medium-sized. ?     The exam was otherwise without abnormality. Light yellow stool of small  ?     amounts ?Impression:            - Diverticulosis in the recto-sigmoid colon and in the  ?                       sigmoid colon. ?                       - Non-bleeding external hemorrhoids. ?                       - The examination was otherwise normal. ?                       - No specimens collected. ?Recommendation:        - Return patient to hospital ward for ongoing care. ?                       - Resume regular diet today. ?                       - Continue present medications. ?Procedure Code(s):     --- Professional --- ?                       [phone], Colonoscopy, flexible; diagnostic, including  ?                       collection of specimen(s) by brushing or washing, when  ?                       performed (separate procedure) ?Diagnosis Code(s):     --- Professional --- ?                       K64.4, Residual hemorrhoidal skin tags ?                       K57.30, Diverticulosis of large intestine without  ?  perforation or abscess without bleeding ?CPT copyright 2019 [employer]. All rights reserved. ?The codes documented in this report are preliminary and upon coder review may  ?be revised to meet current compliance requirements. ?Dr. Ulyess Mort ?Quinesha Selinger Raeanne Gathers MD, MD ?05/15/2021 10:08:07 AM ?This report has been signed electronically. ?Number of Addenda: 0 ?Note Initiated On: 05/15/2021 8:56 AM ?Scope Withdrawal Time: 0 hours 9 minutes 28 seconds  ?Total Procedure Duration: 0 hours 23 minutes 10 seconds  ?Estimated Blood Loss:  Estimated blood loss: none. ?     [facility] ?

## 2021-05-16 DIAGNOSIS — R578 Other shock: Secondary | ICD-10-CM | POA: Diagnosis not present

## 2021-05-16 DIAGNOSIS — D62 Acute posthemorrhagic anemia: Secondary | ICD-10-CM

## 2021-05-16 DIAGNOSIS — R6510 Systemic inflammatory response syndrome (SIRS) of non-infectious origin without acute organ dysfunction: Secondary | ICD-10-CM | POA: Diagnosis present

## 2021-05-16 DIAGNOSIS — E876 Hypokalemia: Secondary | ICD-10-CM

## 2021-05-16 DIAGNOSIS — Z7901 Long term (current) use of anticoagulants: Secondary | ICD-10-CM

## 2021-05-16 HISTORY — DX: Acute posthemorrhagic anemia: D62

## 2021-05-16 HISTORY — DX: Hypokalemia: E87.6

## 2021-05-16 HISTORY — DX: Systemic inflammatory response syndrome (sirs) of non-infectious origin without acute organ dysfunction: R65.10

## 2021-05-16 LAB — CBC WITH DIFFERENTIAL/PLATELET
Abs Immature Granulocytes: 0.07 10*3/uL (ref 0.00–0.07)
Basophils Absolute: 0 10*3/uL (ref 0.0–0.1)
Basophils Relative: 0 %
Eosinophils Absolute: 0.2 10*3/uL (ref 0.0–0.5)
Eosinophils Relative: 3 %
HCT: 25.3 % — ABNORMAL LOW (ref 39.0–52.0)
Hemoglobin: 8.3 g/dL — ABNORMAL LOW (ref 13.0–17.0)
Immature Granulocytes: 1 %
Lymphocytes Relative: 21 %
Lymphs Abs: 1.3 10*3/uL (ref 0.7–4.0)
MCH: 31.3 pg (ref 26.0–34.0)
MCHC: 32.8 g/dL (ref 30.0–36.0)
MCV: 95.5 fL (ref 80.0–100.0)
Monocytes Absolute: 0.5 10*3/uL (ref 0.1–1.0)
Monocytes Relative: 8 %
Neutro Abs: 4 10*3/uL (ref 1.7–7.7)
Neutrophils Relative %: 67 %
Platelets: 120 10*3/uL — ABNORMAL LOW (ref 150–400)
RBC: 2.65 MIL/uL — ABNORMAL LOW (ref 4.22–5.81)
RDW: 17.7 % — ABNORMAL HIGH (ref 11.5–15.5)
WBC: 6 10*3/uL (ref 4.0–10.5)
nRBC: 1.2 % — ABNORMAL HIGH (ref 0.0–0.2)

## 2021-05-16 LAB — MAGNESIUM: Magnesium: 1.9 mg/dL (ref 1.7–2.4)

## 2021-05-16 LAB — BASIC METABOLIC PANEL
Anion gap: 6 (ref 5–15)
BUN: 77 mg/dL — ABNORMAL HIGH (ref 8–23)
CO2: 24 mmol/L (ref 22–32)
Calcium: 6.8 mg/dL — ABNORMAL LOW (ref 8.9–10.3)
Chloride: 107 mmol/L (ref 98–111)
Creatinine, Ser: 4.83 mg/dL — ABNORMAL HIGH (ref 0.61–1.24)
GFR, Estimated: 12 mL/min — ABNORMAL LOW (ref >60)
Glucose, Bld: 140 mg/dL — ABNORMAL HIGH (ref 70–99)
Potassium: 3.2 mmol/L — ABNORMAL LOW (ref 3.5–5.1)
Sodium: 137 mmol/L (ref 135–145)

## 2021-05-16 LAB — CULTURE, BLOOD (ROUTINE X 2)
Culture: NO GROWTH
Culture: NO GROWTH
Special Requests: ADEQUATE

## 2021-05-16 LAB — PHOSPHORUS: Phosphorus: 5 mg/dL — ABNORMAL HIGH (ref 2.5–4.6)

## 2021-05-16 LAB — VITAMIN B12: Vitamin B-12: 1045 pg/mL — ABNORMAL HIGH (ref 180–914)

## 2021-05-16 MED ORDER — SULFAMETHOXAZOLE-TRIMETHOPRIM 800-160 MG PO TABS
1.0000 | ORAL_TABLET | ORAL | Status: DC
  Administered 2021-05-17: 1 via ORAL
  Filled 2021-05-16: qty 1

## 2021-05-16 MED ORDER — PREDNISONE 50 MG PO TABS
60.0000 mg | ORAL_TABLET | Freq: Every day | ORAL | Status: DC
  Administered 2021-05-17 – 2021-05-18 (×2): 60 mg via ORAL
  Filled 2021-05-16 (×2): qty 1

## 2021-05-16 MED ORDER — METOPROLOL TARTRATE 25 MG PO TABS
25.0000 mg | ORAL_TABLET | Freq: Two times a day (BID) | ORAL | Status: DC
  Administered 2021-05-16 – 2021-05-18 (×4): 25 mg via ORAL
  Filled 2021-05-16 (×4): qty 1

## 2021-05-16 MED ORDER — POTASSIUM CHLORIDE CRYS ER 20 MEQ PO TBCR
20.0000 meq | EXTENDED_RELEASE_TABLET | Freq: Once | ORAL | Status: AC
  Administered 2021-05-16: 20 meq via ORAL
  Filled 2021-05-16: qty 1

## 2021-05-16 NOTE — Progress Notes (Signed)
?[facility]  ?ROUNDING NOTE  ? ?Subjective:  ? 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   ? ? Patient has been admitted for Hemorrhagic shock ([place]) [R57.8] ?GIB (gastrointestinal bleeding) [K92.2] ?Upper GI bleed [K92.2] ? ? ?Update: ?Patient currently lying in bed wife at bedside ?Alert and oriented ?Denies pain or discomfort ? ? ?Objective:  ?Vital signs in last 24 hours:  ?Temp:  [97.6 ?F (36.4 ?C)-99.2 ?F (37.3 ?C)] 98.4 ?F (36.9 ?C) (04/23 0900) ?Pulse Rate:  [71-126] 94 (04/23 1215) ?Resp:  [12-30] 18 (04/23 1215) ?BP: (96-139)/(50-96) 128/72 (04/23 1215) ?SpO2:  [91 %-98 %] 91 % (04/23 1215) ?Weight:  [96 kg] 96 kg (04/23 0500) ? ?Weight change: -0.6 kg ?Filed Weights  ? 05/14/21 0918 05/14/21 1301 05/16/21 0500  ?Weight: 96.6 kg 96.4 kg 96 kg  ? ? ?Intake/Output: ?I/O last 3 completed shifts: ?In: 6007.7 [P.O.:4000; I.V.:136.4; Blood:1582.5; IV Piggyback:288.7] ?Out: 625 [Urine:625] ?  ?Intake/Output this shift: ? Total I/O ?In: 371.7 [P.O.:240; I.V.:21.6; IV Piggyback:110.1] ?Out: -  ? ?Physical Exam: ?General: NAD  ?Head: Normocephalic, atraumatic.    ?Eyes: Anicteric  ?Lungs:  Clear to auscultation, normal effort,  ?Heart: Irregular rate and rhythm  ?Abdomen:  Soft, nontender, nondistended  ?Extremities: + peripheral edema.  ?Neurologic: Alert, oriented    ?Skin: Warm, dry  ?Access: Rt permcath placed on 04/22/21 by Dr Lucky Cowboy  ? ? ?Basic Metabolic Panel: ?Recent Labs  ?Lab 05/12/21 ?1740 05/13/21 ?0532 05/14/21 ?0434 05/15/21 ?0113 05/16/21 ?8144  ?NA 134* 136 138 136 137  ?K 4.3 3.6 3.4* 3.0* 3.2*  ?CL 103 104 104 102 107  ?CO2 20* 25 24 26 24   ?GLUCOSE 114* 125* 122* 88 140*  ?BUN 146* 90* 112* 61* 77*  ?CREATININE 4.82* 3.68* 4.80* 3.59* 4.83*  ?CALCIUM 7.1* 6.8* 7.2* 7.3* 6.8*  ?MG 1.9 1.7 2.2  2.0 1.9  ?PHOS 5.0* 4.9* 5.5* 4.4 5.0*  ? ? ? ?Liver Function Tests: ?Recent Labs  ?Lab 05/11/21 ?8185 05/12/21 ?6314  ?AST 23 26  ?ALT 18 35  ?ALKPHOS 31* 31*  ?BILITOT 0.5 1.2  ?PROT 3.7* 3.9*  ?ALBUMIN 1.8* 2.0*  ? ? ?No results for input(s): LIPASE, AMYLASE in the last 168 hours. ? ?No results for input(s): AMMONIA in the last 168 hours. ? ?CBC: ?Recent Labs  ?Lab 05/11/21 ?9702 05/11/21 ?1316 05/12/21 ?6378 05/12/21 ?5885 05/13/21 ?0532 05/13/21 ?1431 05/14/21 ?0434 05/14/21 ?0744 05/14/21 ?1900 05/15/21 ?0113 05/15/21 ?0422 05/15/21 ?1750 05/16/21 ?0277  ?WBC 17.7*  --  18.8*  --  13.9*  --  9.9  --   --  7.7  --   --  6.0  ?NEUTROABS 13.7*  --   --   --   --   --   --   --   --  5.8  --   --  4.0  ?HGB 4.1*   < > 7.0*   < > 6.3*   < > 6.8*   < > 6.6* 9.0* 8.7* 9.0* 8.3*  ?HCT 13.1*   < > 20.3*   < > 18.7*   < > 20.1*   < > 19.2* 26.9* 25.5* 27.0* 25.3*  ?MCV 92.3  --  86.8  --  92.1  --  91.4  --   --  91.8  --   --  95.5  ?PLT 149*  --  103*  --  96*  --  106*  --   --  104*  --   --  120*  ? < > = values in this interval not displayed.  ? ? ? ?Cardiac Enzymes: ?No results for input(s): CKTOTAL, CKMB, CKMBINDEX, TROPONINI in the last 168 hours. ? ?BNP: ?Invalid input(s): POCBNP ? ?CBG: ?Recent Labs  ?Lab 05/11/21 ?0820 05/15/21 ?2671  ?GLUCAP 95 71  ? ? ? ?Microbiology: ?Results for orders placed or performed during the hospital encounter of 05/11/21  ?Culture, blood (Routine X 2) w Reflex to ID Panel     Status: None  ? Collection Time: 05/11/21  1:16 PM  ? Specimen: BLOOD  ?Result Value Ref Range Status  ? Specimen Description BLOOD LEFT HAND  Final  ? Special Requests   Final  ?  BOTTLES DRAWN AEROBIC AND ANAEROBIC Blood Culture adequate volume  ? Culture   Final  ?  NO GROWTH 5 DAYS ?Performed at [facility], [address] [place], [place] [address] ?  ? Report Status 05/16/2021 FINAL  Final  ?Culture, blood (Routine X 2) w Reflex to ID Panel     Status: None  ? Collection Time: 05/11/21   1:16 PM  ? Specimen: BLOOD  ?Result Value Ref Range Status  ? Specimen Description BLOOD RIGHT HAND  Final  ? Special Requests   Final  ?  BOTTLES DRAWN AEROBIC ONLY Blood Culture results may not be optimal due to an inadequate volume of blood received in culture bottles  ? Culture   Final  ?  NO GROWTH 5 DAYS ?Performed at [facility], [address] [place], [place] [address] ?  ? Report Status 05/16/2021 FINAL  Final  ?MRSA Next Gen by PCR, Nasal     Status: None  ? Collection Time: 05/11/21  7:00 PM  ? Specimen: Nasal Mucosa; Nasal Swab  ?Result Value Ref Range Status  ? MRSA by PCR Next Gen NOT DETECTED NOT DETECTED Final  ?  Comment: (NOTE) ?The GeneXpert MRSA Assay (FDA approved for NASAL specimens only), ?is one component of a comprehensive MRSA colonization surveillance ?program. It is not intended to diagnose MRSA infection nor to guide ?or monitor treatment for MRSA infections. ?Test performance is not FDA approved in patients less than 2 years ?old. ?Performed at [facility], [place], ?[place] [address] ?  ? ? ?Coagulation Studies: ?No results for input(s): LABPROT, INR in the last 72 hours. ? ? ?Urinalysis: ?No results for input(s): COLORURINE, LABSPEC, [place], GLUCOSEU, HGBUR, BILIRUBINUR, KETONESUR, PROTEINUR, UROBILINOGEN, NITRITE, LEUKOCYTESUR in the last 72 hours. ? ?Invalid input(s): APPERANCEUR ?  ? ? ?Imaging: ?NM GI Blood Loss ? ?Result Date: 05/14/2021 ?CLINICAL DATA:  GI bleed EXAM: NUCLEAR MEDICINE GASTROINTESTINAL BLEEDING SCAN TECHNIQUE: Sequential abdominal images were obtained following intravenous administration of Tc-37m labeled red blood cells. RADIOPHARMACEUTICALS:  23.25 mCi Tc-75m pertechnetate in-vitro labeled red cells. COMPARISON:  None. FINDINGS: No suspicious scintigraphic activity to suggest GI bleed. Uptake seen in the lower pelvis is likely penile blood pool activity. IMPRESSION: No evidence of active GI bleed. Electronically Signed    By: Yetta Glassman M.D.   On: 05/14/2021 16:31   ? ? ?Medications:  ? ? sodium chloride Stopped (05/15/21 2345)  ? ? sodium chloride   Intravenous Once  ? Chlorhexidine Gluconate Cloth  6 each Topical Q0600  ? hydrocortisone sod succinate (SOLU-CORTEF) inj  100 mg Intravenous Daily  ? metoprolol tartrate  25 mg  Oral BID  ? pantoprazole  40 mg Oral BID  ? sodium chloride flush  10-40 mL Intracatheter Q12H  ? [START ON 05/17/2021] sulfamethoxazole-trimethoprim  1 tablet Oral Once per day on Mon Wed Fri  ? ?docusate sodium, metoprolol tartrate, polyethylene glycol, sodium chloride flush ? ?Assessment/ Plan:  ?Blake West is a 71 y.o.  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  Patient presents to the emergency department with complaints of nausea, vomiting, generalized decline.  Patient has been admitted for Hemorrhagic shock ([place]) [R57.8] ?GIB (gastrointestinal bleeding) [K92.2] ?Upper GI bleed [K92.2] ? ?[place], MWF, right PermCath ? ?Acute Kidney Injury with hyperkalemia on chronic kidney disease stage IV with baseline creatinine 3.0 and GFR of 21 on 02/17/21.  ?AKI multifactorial with contributions from ANCA vasculitis ([facility]) (N01.7) and ATN.  ?  Chronic kidney disease is secondary to diabetes, NSAID use and hypertension.  ? ?CT abdomen pelvis negative for obstruction.  No exposure to IV contrast.  Patient received initial dialysis 04/22/21.  ?QuantiFERON-TB gold obtained on 04/22/21 ?Renal biopsy 04/26/21. : ANCA related disease, 38% fibrous-cellular crescents, 50% interstitial fibrosis with moderate to severe scarring and ATN.  ? ?Given rituximab 1 gm on 04/30/2021 ?Was on Prednisone 60 mg daily ?Bactrim DS three times per week ? ?-Next dialysis treatment will be tomorrow ?- We will order rituximab 1 g infusion for Tuesday ?- Patient  temporarily on Solu-Cortef, will need to transition to prednisone once completed. ? ? ?Lab Results  ?Component Value Date  ? CREATININE 4.83 (H) 05/16/2021  ? CREATININE 3.59 (H) 05/15/2021  ? CREATININE 4.80

## 2021-05-16 NOTE — Assessment & Plan Note (Signed)
Likely reactive in the setting of bleeding.  Patient is also on steroids for ANCA vasculitis. ?Patient had multifocal pneumonia recently, now recovered. ?Monitor for fevers or other evidence of infection. ?Follow CBCs. ?

## 2021-05-16 NOTE — Assessment & Plan Note (Signed)
C ANCA vasculitis. ?Nephrology following. ?On dialysis. ?

## 2021-05-16 NOTE — Evaluation (Signed)
Physical Therapy Evaluation ?Patient Details ?Name: Blake West ?MRN: [id number] ?DOB: 10/05/1950 ?Today's Date: 05/16/2021 ? ?History of Present Illness ? Patient is a 71 y.o male with PMH significant for chronic kidney disease stage IV with glomerulonephritis,  ANCA-associated vasculitis, and HFrEF (LVEF 40-45%), Atrial fibrillation on Eliquis who is  admitted 05/11/2021 with Hemorrhagic Shock in the setting of Acute GI Bleed.  ?Clinical Impression ? Patient is agreeable to PT evaluation. Supportive spouse at the bedside. Patient reports he was ambulatory without assistive device prior to admission and spouse assists with bathing at home.  ?Patient reports he has not been out of the bed in 5 days and is eager to mobilize as able. Vitals were monitored throughout session. The patient required minimal assistance for bed mobility and needed assistance for standing x 2 bouts. He was able to stand for 2.5 minutes with each standing bout. He was able to take a few steps with rolling walker along edge of bed. Blood pressure sitting 119/92 and standing was 121/95. Heart rate does increased with standing activity up to 150's, which patient reports is not uncommon for him, with no physical symptoms reported by the patient.  The patient and spouse are hopeful that patient will return home when medically ready. Recommend PT to maximize independence and decrease caregiver burden. Anticipate patient will be able to return home with family support as long as he continues to have improved functional independence and activity tolerance.  ? ?   ? ?Recommendations for follow up therapy are one component of a multi-disciplinary discharge planning process, led by the attending physician.  Recommendations may be updated based on patient status, additional functional criteria and insurance authorization. ? ?Follow Up Recommendations Home health PT ? ?  ?Assistance Recommended at Discharge Intermittent Supervision/Assistance  ?Patient can  return home with the following ? A little help with walking and/or transfers;A lot of help with bathing/dressing/bathroom;Help with stairs or ramp for entrance;Assist for transportation ? ?  ?Equipment Recommendations None recommended by PT  ?Recommendations for Other Services ?    ?  ?Functional Status Assessment Patient has had a recent decline in their functional status and demonstrates the ability to make significant improvements in function in a reasonable and predictable amount of time.  ? ?  ?Precautions / Restrictions Precautions ?Precautions: Fall ?Restrictions ?Weight Bearing Restrictions: No  ? ?  ? ?Mobility ? Bed Mobility ?Overal bed mobility: Needs Assistance ?Bed Mobility: Supine to Sit, Sit to Supine ?  ?  ?Supine to sit: Min assist ?Sit to supine: Min assist ?  ?General bed mobility comments: assistance for trunk support to sit upright. assistance for LE support to return to bed. verbal cues for technique and sequencing ?  ? ?Transfers ?Overall transfer level: Needs assistance ?Equipment used: Rolling walker (2 wheels) ?Transfers: Sit to/from Stand ?Sit to Stand: Min assist, Mod assist ?  ?  ?  ?  ?  ?General transfer comment: Min A for first stand and Mod A for second stand. verbal cues for technique ?  ? ?Ambulation/Gait ?Ambulation/Gait assistance: Min guard ?Gait Distance (Feet): 2 Feet ?Assistive device: Rolling walker (2 wheels) ?  ?  ?  ?Pre-gait activities: 2 bouts of standing performed with standing tolerance of ~ 2.5 minutes with each stand to assess readiness for ambulation. rolling walker used for UE support. supervision- Min guard for safety. mild dizziness is initially reported with standing that subsides with increased standing time. hear rate does increase with standing into the 150's ?General Gait  Details: patient able to side step along edge of bed with Min guard for safety and cues for technique. unable to ambulate further due to fatigue with standing activity ? ?Stairs ?  ?  ?  ?   ?  ? ?Wheelchair Mobility ?  ? ?Modified Rankin (Stroke Patients Only) ?  ? ?  ? ?Balance Overall balance assessment: Needs assistance ?Sitting-balance support: No upper extremity supported ?Sitting balance-Leahy Scale: Good ?  ?  ?Standing balance support: Bilateral upper extremity supported, During functional activity ?Standing balance-Leahy Scale: Fair ?Standing balance comment: with rolling walker for UE support ?  ?  ?  ?  ?  ?  ?  ?  ?  ?  ?  ?   ? ? ? ?Pertinent Vitals/Pain Pain Assessment ?Pain Assessment: No/denies pain  ? ? ?Home Living Family/patient expects to be discharged to:: Private residence ?Living Arrangements: Spouse/significant other ?Available Help at Discharge: Family;Available 24 hours/day ?Type of Home: House ?Home Access: Stairs to enter ?Entrance Stairs-Rails: Right;Left;Can reach both ?Entrance Stairs-Number of Steps: 3 ?  ?Home Layout: Two level;Able to live on main level with bedroom/bathroom ?Home Equipment: Conservation officer, nature (2 wheels);Wheelchair - manual;Shower seat (lift chair) ?   ?  ?Prior Function Prior Level of Function : Independent/Modified Independent ?  ?  ?  ?  ?  ?  ?Mobility Comments: Mod I with ambulation ?ADLs Comments: spouse helps with bathing ?  ? ? ?Hand Dominance  ?   ? ?  ?Extremity/Trunk Assessment  ? Upper Extremity Assessment ?Upper Extremity Assessment: Generalized weakness ?  ? ?Lower Extremity Assessment ?Lower Extremity Assessment: Generalized weakness (able to perform SLR bilaterally from supine position. endurance impaired for sustained activity in standing) ?  ? ?   ?Communication  ? Communication: No difficulties  ?Cognition Arousal/Alertness: Awake/alert ?Behavior During Therapy: [facility] for tasks assessed/performed ?Overall Cognitive Status: Within Functional Limits for tasks assessed ?  ?  ?  ?  ?  ?  ?  ?  ?  ?  ?  ?  ?  ?  ?  ?  ?General Comments: patient is able to follow all direcions without difficulty ?  ?  ? ?  ?General Comments   ? ?  ?Exercises     ? ?Assessment/Plan  ?  ?PT Assessment Patient needs continued PT services  ?PT Problem List Decreased strength;Decreased range of motion;Decreased activity tolerance;Decreased balance;Decreased mobility ? ?   ?  ?PT Treatment Interventions DME instruction;Gait training;Stair training;Functional mobility training;Therapeutic activities;Therapeutic exercise;Balance training;Neuromuscular re-education;Cognitive remediation;Patient/family education   ? ?PT Goals (Current goals can be found in the Care Plan section)  ?Acute Rehab PT Goals ?Patient Stated Goal: to get better ?PT Goal Formulation: With patient/family ?Time For Goal Achievement: 05/30/21 ?Potential to Achieve Goals: Good ? ?  ?Frequency Min 2X/week ?  ? ? ?Co-evaluation   ?  ?  ?  ?  ? ? ?  ?AM-PAC PT "6 Clicks" Mobility  ?Outcome Measure Help needed turning from your back to your side while in a flat bed without using bedrails?: A Little ?Help needed moving from lying on your back to sitting on the side of a flat bed without using bedrails?: A Little ?Help needed moving to and from a bed to a chair (including a wheelchair)?: A Lot ?Help needed standing up from a chair using your arms (e.g., wheelchair or bedside chair)?: A Little ?Help needed to walk in hospital room?: A Little ?Help needed climbing 3-5 steps with a railing? :  A Lot ?6 Click Score: 16 ? ?  ?End of Session Equipment Utilized During Treatment: Gait belt ?Activity Tolerance: Patient tolerated treatment well ?Patient left: in bed;with call bell/phone within reach;with family/visitor present;with nursing/sitter in room (sitting up on edge of bed with family present per his request) ?Nurse Communication: Mobility status (elevated HR with activity) ?PT Visit Diagnosis: Unsteadiness on feet (R26.81);Muscle weakness (generalized) (M62.81);Difficulty in walking, not elsewhere classified (R26.2) ?  ? ?Time: 9150-4136 ?PT Time Calculation (min) (ACUTE ONLY): 46 min ? ? ?Charges:   PT Evaluation ?$PT  Eval Moderate Complexity: 1 Mod ?PT Treatments ?$Therapeutic Activity: 23-37 mins ?  ?   ? ?Minna Merritts, PT, MPT ? ? ?Percell Locus ?05/16/2021, 2:19 PM ? ?

## 2021-05-16 NOTE — Assessment & Plan Note (Signed)
Recently started on dialysis in setting of ANCA vasculitis/RPGN.   ?--Nephrology is following. ?-- Dialysis schedule MWF ?-- Access is right PermCath ?

## 2021-05-16 NOTE — Assessment & Plan Note (Signed)
Eliquis on hold due to GI bleeding and anemia requiring multiple blood transfusions. ?Labile heart rates, intermittently in RVR. ?-- Continue metoprolol 25 mg twice daily ?-- IV metoprolol pushes as needed ?-- Telemetry ?-- Adjust rate control agents as needed ?

## 2021-05-16 NOTE — Hospital Course (Signed)
71 year old male with past medical history of CKD stage IV with glomerulonephritis, ANCA associated vasculitis, recently started on hemodialysis during recent admission, HFrEF (EF 40-45%), A-fib on Eliquis who was admitted to the ICU on 05/11/2021 with hemorrhagic shock in the setting of acute GI bleed.  He reported black tarry stools for 2 days at home, cramping abdominal discomfort prior to BMs, as well as dyspnea on exertion and intermittent dizziness. ? ?Recent admission 3-27 through 04/30/2021, sent by his nephrologist due to hyperkalemia and renal failure with creatinine of 15.  He was started on dialysis 04/22/2021.  He was also treated for multifocal pneumonia that admission.  That admission also complicated by urinary retention which has resolved.  He was treated with IV Solu-Medrol and transition to prednisone for ANCA associated vasculitis.  Started on rituximab.  Being followed by pulmonary and rheumatology.  On prophylactic Bactrim 3 times weekly. ? ?ED course --- EDP reported grossly positive black stool, guaiac positive.  BP 84/47, HR 114, RR 20, afebrile without hypoxia.  Significant labs:  Glucose 138, BUN 107, creatinine 3.75, calcium 6.8, high-sensitivity troponin 63, lactic acid 5.0, WBC 17.7, hemoglobin 4.1, hematocrit 13.1, platelets 149, PT 21, INR 1.8.   ? ?Patient was started on IV Protonix, fluid resuscitated, treated with Kcentra and emergently transfused 1 unit PRBCs.  Due to hemorrhagic shock, Levophed drip was started and patient was admitted to ICU. ? ?Gastroenterology consulted and patient was taken for EGD on 4/20 which revealed 1 nonbleeding cratered duodenal ulcer with a clean ulcer base in the second portion of the duodenum 15 mm in largest dimension.   ?Colonoscopy was performed 05/15/2021 which showed diverticulosis in the rectosigmoid and sigmoid colon, nonbleeding external hemorrhoids, otherwise normal exam. ? ?In total, 6 units PRBCs transfused between 4/18 and 4/21. ?Hemoglobin  now stable 8's to 9.0 since 4/22. ? ?TRH assumed care on 05/16/2021.  Patient off pressors and stable for transfer out to PCU. ? ? ? ? ? ? ? ?

## 2021-05-16 NOTE — Assessment & Plan Note (Signed)
Renal biopsy confirmed on 04/26/2021. ?On rituximab, last dose given 04/30/2021. ?Has been on prednisone 60 mg daily. ?On prophylactic Bactrim 3 times weekly. ?Nephrology following. ?

## 2021-05-16 NOTE — Assessment & Plan Note (Signed)
And hemorrhagic shock on admission.   ?BPs now stable. ?Resumed on home metoprolol. ?Monitor for hypotension. ?Adjust antihypertensives as needed ?

## 2021-05-16 NOTE — Assessment & Plan Note (Signed)
Appears euvolemic - Continue metoprolol 

## 2021-05-16 NOTE — Assessment & Plan Note (Signed)
Likely due to bleeding duodenal ulcer.  No bleeding source was seen on colonoscopy. ?Resolved. ?

## 2021-05-16 NOTE — Assessment & Plan Note (Signed)
Status post transfusion of 2 units PRBCs total since admission.  Secondary to duodenal ulcer. ?-- Monitor CBC ?-- Transfuse for hemoglobin less than 7 or signs of active bleeding ?

## 2021-05-16 NOTE — Assessment & Plan Note (Signed)
Well-controlled, last A1c 5.9%. ?Appears not on medications outpatient. ?Monitor fasting glucose for now. ?Start sliding scale insulin if consistently above 180. ?

## 2021-05-16 NOTE — Progress Notes (Signed)
PHARMACY CONSULT NOTE - FOLLOW UP ? ?Pharmacy Consult for Electrolyte Monitoring and Replacement  ? ?Recent Labs: ?Potassium (mmol/L)  ?Date Value  ?05/16/2021 3.2 (L)  ? ?Magnesium (mg/dL)  ?Date Value  ?05/16/2021 1.9  ? ?Calcium (mg/dL)  ?Date Value  ?05/16/2021 6.8 (L)  ? ?Albumin (g/dL)  ?Date Value  ?05/12/2021 2.0 (L)  ? ?Phosphorus (mg/dL)  ?Date Value  ?05/16/2021 5.0 (H)  ? ?Sodium (mmol/L)  ?Date Value  ?05/16/2021 137  ?06/26/2019 138  ? ?Corrected Calcium: 8.4 mg/dl ? ?Assessment: ?70 year old male presenting with melena. PMH includes ESRD on HD MWF, atrial fibrillation on Eliquis 5 mg BID (unknown last dose), glomerulonephritis, HFrEF EF 40-45%, hypothyroidism, hypertension, type 2 diabetes mellitus. Admitted with acute GI bleed.  ? ?Goal of Therapy:  ?K 4-5 ?Mag > 2 ?All other electrolytes within normal limits ? ?Plan:  ?Will give Kcl 20 mEq PO x 1.  ?Follow up electrolytes in the AM ? ?Oswald Hillock, PharmD ?05/16/2021 ?8:24 AM ?

## 2021-05-16 NOTE — Assessment & Plan Note (Signed)
Resolved.  Patient required Levophed in the setting of GI bleed.  BP is now stable off pressors.  Monitor.  Maintain MAP above 65. ?

## 2021-05-16 NOTE — Assessment & Plan Note (Addendum)
Appears due to duodenal ulcer which was seen on EGD, see report.  Bleeding appears resolved with stable hemoglobin since 4/22. ?--Appreciate GI recommendations ?-- Continue PPI twice daily indefinitely ?

## 2021-05-16 NOTE — Progress Notes (Signed)
?Progress Note ? ? ?Patient: Blake West HFS:[id number] DOB: 12/20/1950 DOA: 05/11/2021     5 ?DOS: the patient was seen and examined on 05/16/2021 ?  ?Brief hospital course: ?71 year old male with past medical history of CKD stage IV with glomerulonephritis, ANCA associated vasculitis, recently started on hemodialysis during recent admission, HFrEF (EF 40-45%), A-fib on Eliquis who was admitted to the ICU on 05/11/2021 with hemorrhagic shock in the setting of acute GI bleed.  He reported black tarry stools for 2 days at home, cramping abdominal discomfort prior to BMs, as well as dyspnea on exertion and intermittent dizziness. ? ?Recent admission 3-27 through 04/30/2021, sent by his nephrologist due to hyperkalemia and renal failure with creatinine of 15.  He was started on dialysis 04/22/2021.  He was also treated for multifocal pneumonia that admission.  That admission also complicated by urinary retention which has resolved.  He was treated with IV Solu-Medrol and transition to prednisone for ANCA associated vasculitis.  Started on rituximab.  Being followed by pulmonary and rheumatology.  On prophylactic Bactrim 3 times weekly. ? ?ED course --- EDP reported grossly positive black stool, guaiac positive.  BP 84/47, HR 114, RR 20, afebrile without hypoxia.  Significant labs:  Glucose 138, BUN 107, creatinine 3.75, calcium 6.8, high-sensitivity troponin 63, lactic acid 5.0, WBC 17.7, hemoglobin 4.1, hematocrit 13.1, platelets 149, PT 21, INR 1.8.   ? ?Patient was started on IV Protonix, fluid resuscitated, treated with Kcentra and emergently transfused 1 unit PRBCs.  Due to hemorrhagic shock, Levophed drip was started and patient was admitted to ICU. ? ?Gastroenterology consulted and patient was taken for EGD on 4/20 which revealed 1 nonbleeding cratered duodenal ulcer with a clean ulcer base in the second portion of the duodenum 15 mm in largest dimension.   ?Colonoscopy was performed 05/15/2021 which showed  diverticulosis in the rectosigmoid and sigmoid colon, nonbleeding external hemorrhoids, otherwise normal exam. ? ?In total, 6 units PRBCs transfused between 4/18 and 4/21. ?Hemoglobin now stable 8's to 9.0 since 4/22. ? ?TRH assumed care on 05/16/2021.  Patient off pressors and stable for transfer out to PCU. ? ? ? ? ? ? ? ? ?Assessment and Plan: ?* Hemorrhagic shock ([place]) ?Resolved.  Patient required Levophed in the setting of GI bleed.  BP is now stable off pressors.  Monitor.  Maintain MAP above 65. ? ?Acute blood loss anemia ([facility]) ?Status post transfusion of 2 units PRBCs total since admission.  Secondary to duodenal ulcer. ?-- Monitor CBC ?-- Transfuse for hemoglobin less than 7 or signs of active bleeding ? ?Hematochezia ?Likely due to bleeding duodenal ulcer.  No bleeding source was seen on colonoscopy. ?Resolved. ? ?GIB (gastrointestinal bleeding) ?Appears due to duodenal ulcer which was seen on EGD, see report.  Bleeding appears resolved with stable hemoglobin since 4/22. ?--Appreciate GI recommendations ?-- Continue PPI twice daily indefinitely ? ?ESRD (end stage renal disease) on dialysis [facility]) ?Recently started on dialysis in setting of ANCA vasculitis/RPGN.   ?--Nephrology is following. ?-- Dialysis schedule MWF ?-- Access is right PermCath ? ?Rapidly progressive nephritic syndrome, diffuse crescentic glomerulonephritis ?C ANCA vasculitis. ?Nephrology following. ?On dialysis. ? ?ANCA-associated vasculitis ([place]) ?Renal biopsy confirmed on 04/26/2021. ?On rituximab, last dose given 04/30/2021. ?Has been on prednisone 60 mg daily. ?On prophylactic Bactrim 3 times weekly. ?Nephrology following. ? ?Chronic atrial fibrillation with RVR ([place]) ?Eliquis on hold due to GI bleeding and anemia requiring multiple blood transfusions. ?Labile heart rates, intermittently in RVR. ?-- Continue metoprolol 25 mg  twice daily ?-- IV metoprolol pushes as needed ?-- Telemetry ?-- Adjust rate control agents as needed ? ?Chronic  HFrEF (heart failure with reduced ejection fraction) ([place]) ?Appears euvolemic.  Continue metoprolol ? ?Leukocytosis ?Likely reactive in the setting of bleeding.  Patient is also on steroids for ANCA vasculitis. ?Patient had multifocal pneumonia recently, now recovered. ?Monitor for fevers or other evidence of infection. ?Follow CBCs. ? ?Essential hypertension ?And hemorrhagic shock on admission.   ?BPs now stable. ?Resumed on home metoprolol. ?Monitor for hypotension. ?Adjust antihypertensives as needed ? ?Chronic anticoagulation ?On Eliquis for A-fib.  Presented with GI bleeding.  Eliquis on hold. ? ?SIRS (systemic inflammatory response syndrome) (HCC) ?Patient met SIRS criteria on admission due to tachycardia, leukocytosis and hypotension, lactic 5.0.  Patient had recently been treated for multifocal pneumonia 3/27 through 4/7, completed full course of Rocephin and Zithromax.  Remains on Bactrim prophylaxis. ?-- No evidence of infection at this time ?-- Stop empiric Flagyl ?-- Monitor fever curve, CBC, follow cultures ? ?Vitamin D deficiency ?. ? ?Type 2 diabetes mellitus ([place]) ?Well-controlled, last A1c 5.9%. ?Appears not on medications outpatient. ?Monitor fasting glucose for now. ?Start sliding scale insulin if consistently above 180. ? ? ? ? ?  ? ?Subjective: Patient seen in ICU this morning with wife at bedside.  He reports feeling better.  No chest pain, palpitations shortness of breath, dizziness.  No recurrence of melanotic stools. ? ?Physical Exam: ?Vitals:  ? 05/16/21 1200 05/16/21 1215 05/16/21 1500 05/16/21 1551  ?BP: 118/76 128/72 124/82 136/76  ?Pulse: 100 94 100 (!) 56  ?Resp: _0 ?Temp:    98.3 ?F (36.8 ?C)  ?TempSrc:      ?SpO2: 92% 91% 97% 96%  ?Weight:      ?Height:      ? ?General exam: awake, alert, no acute distress ?HEENT: moist mucus membranes, hearing grossly normal  ?Respiratory system: CTAB, no wheezes, rales or rhonchi, normal respiratory effort. ?Cardiovascular system: normal  S1/S2, irregularly irregular,  no pedal edema, right IJ PermCath in place.   ?Gastrointestinal system: soft, NT, ND, no HSM felt, +bowel sounds. ?Central nervous system: A&O x4. no gross focal neurologic deficits, normal speech ?Extremities: moves all, no edema, normal tone ?Skin: dry, intact, normal temperature ?Psychiatry: normal mood, congruent affect, judgement and insight appear normal ? ? ?Data Reviewed: ? ?Notable labs: K3.2, glucose 140, BUN 77, creatinine 4.83, calcium 6.8, phosphorus 5.0, GFR 12 hemoglobin 8.3 down slightly from 9.0, platelets improved from 104K up to 120 K ? ?Family Communication: Wife at bedside on rounds ? ?Disposition: ?Status is: Inpatient ?Remains inpatient appropriate because: Severity of illness with recently resolved hemorrhagic shock with severe anemia requiring multiple transfusions.  Requires further close monitoring for stability prior to safe discharge.  Weaning IV steroids ? ? Planned Discharge Destination: Home ? ? ? ?Time spent: 45 minutes ? ?Author: ?Ezekiel Slocumb, DO ?05/16/2021 5:15 PM ? ?For on call review [email or web address].  ?

## 2021-05-16 NOTE — Assessment & Plan Note (Signed)
On Eliquis for A-fib.  Presented with GI bleeding.  Eliquis on hold. ?

## 2021-05-16 NOTE — Assessment & Plan Note (Signed)
K today 3.2, replacing.  ?Cautious replacement given renal failure. ?Monitor BMP. ?

## 2021-05-16 NOTE — Progress Notes (Signed)
Report called to nurse receiving patient into 231, and pt transported to room via bed accompanied by nurse tech. Telemetry applied and box confirmed with CCMD.  ?

## 2021-05-16 NOTE — Assessment & Plan Note (Signed)
Patient met SIRS criteria on admission due to tachycardia, leukocytosis and hypotension, lactic 5.0.  Patient had recently been treated for multifocal pneumonia 3/27 through 4/7, completed full course of Rocephin and Zithromax.  Remains on Bactrim prophylaxis. ?-- No evidence of infection at this time ?-- Stop empiric Flagyl ?-- Monitor fever curve, CBC, follow cultures ?

## 2021-05-17 ENCOUNTER — Encounter: Payer: Self-pay | Admitting: Gastroenterology

## 2021-05-17 DIAGNOSIS — R578 Other shock: Secondary | ICD-10-CM | POA: Diagnosis not present

## 2021-05-17 LAB — PHOSPHORUS: Phosphorus: 5 mg/dL — ABNORMAL HIGH (ref 2.5–4.6)

## 2021-05-17 LAB — CBC WITH DIFFERENTIAL/PLATELET
Abs Immature Granulocytes: 0.08 10*3/uL — ABNORMAL HIGH (ref 0.00–0.07)
Basophils Absolute: 0 10*3/uL (ref 0.0–0.1)
Basophils Relative: 0 %
Eosinophils Absolute: 0.3 10*3/uL (ref 0.0–0.5)
Eosinophils Relative: 4 %
HCT: 24.9 % — ABNORMAL LOW (ref 39.0–52.0)
Hemoglobin: 8.1 g/dL — ABNORMAL LOW (ref 13.0–17.0)
Immature Granulocytes: 1 %
Lymphocytes Relative: 28 %
Lymphs Abs: 1.8 10*3/uL (ref 0.7–4.0)
MCH: 31.2 pg (ref 26.0–34.0)
MCHC: 32.5 g/dL (ref 30.0–36.0)
MCV: 95.8 fL (ref 80.0–100.0)
Monocytes Absolute: 0.4 10*3/uL (ref 0.1–1.0)
Monocytes Relative: 7 %
Neutro Abs: 3.8 10*3/uL (ref 1.7–7.7)
Neutrophils Relative %: 60 %
Platelets: 130 10*3/uL — ABNORMAL LOW (ref 150–400)
RBC: 2.6 MIL/uL — ABNORMAL LOW (ref 4.22–5.81)
RDW: 17.7 % — ABNORMAL HIGH (ref 11.5–15.5)
WBC: 6.4 10*3/uL (ref 4.0–10.5)
nRBC: 0.3 % — ABNORMAL HIGH (ref 0.0–0.2)

## 2021-05-17 LAB — HEMOGLOBIN AND HEMATOCRIT, BLOOD
HCT: 26.3 % — ABNORMAL LOW (ref 39.0–52.0)
Hemoglobin: 8.8 g/dL — ABNORMAL LOW (ref 13.0–17.0)

## 2021-05-17 LAB — ALBUMIN: Albumin: 2.1 g/dL — ABNORMAL LOW (ref 3.5–5.0)

## 2021-05-17 LAB — BASIC METABOLIC PANEL
Anion gap: 9 (ref 5–15)
BUN: 89 mg/dL — ABNORMAL HIGH (ref 8–23)
CO2: 23 mmol/L (ref 22–32)
Calcium: 6.8 mg/dL — ABNORMAL LOW (ref 8.9–10.3)
Chloride: 106 mmol/L (ref 98–111)
Creatinine, Ser: 5.59 mg/dL — ABNORMAL HIGH (ref 0.61–1.24)
GFR, Estimated: 10 mL/min — ABNORMAL LOW (ref >60)
Glucose, Bld: 90 mg/dL (ref 70–99)
Potassium: 3.3 mmol/L — ABNORMAL LOW (ref 3.5–5.1)
Sodium: 138 mmol/L (ref 135–145)

## 2021-05-17 LAB — MAGNESIUM: Magnesium: 1.9 mg/dL (ref 1.7–2.4)

## 2021-05-17 MED ORDER — EPOETIN ALFA 10000 UNIT/ML IJ SOLN
4000.0000 [IU] | INTRAMUSCULAR | Status: DC
  Administered 2021-05-17: 4000 [IU] via SUBCUTANEOUS

## 2021-05-17 MED ORDER — POTASSIUM CHLORIDE CRYS ER 20 MEQ PO TBCR
40.0000 meq | EXTENDED_RELEASE_TABLET | Freq: Once | ORAL | Status: AC
  Administered 2021-05-17: 40 meq via ORAL
  Filled 2021-05-17: qty 2

## 2021-05-17 MED ORDER — FAMOTIDINE IN NACL 20-0.9 MG/50ML-% IV SOLN
20.0000 mg | Freq: Once | INTRAVENOUS | Status: DC | PRN
  Filled 2021-05-17: qty 50

## 2021-05-17 MED ORDER — METHYLPREDNISOLONE SODIUM SUCC 125 MG IJ SOLR
125.0000 mg | Freq: Once | INTRAMUSCULAR | Status: DC | PRN
  Filled 2021-05-17: qty 2

## 2021-05-17 MED ORDER — SODIUM CHLORIDE 0.9 % IV SOLN: INTRAVENOUS | Status: DC | PRN

## 2021-05-17 MED ORDER — HEPARIN SODIUM (PORCINE) 1000 UNIT/ML IJ SOLN
INTRAMUSCULAR | Status: AC
  Filled 2021-05-17: qty 10

## 2021-05-17 MED ORDER — SODIUM CHLORIDE 0.9 % IV BOLUS: 1000.0000 mL | Freq: Once | INTRAVENOUS | Status: DC | PRN

## 2021-05-17 MED ORDER — ALBUTEROL SULFATE (2.5 MG/3ML) 0.083% IN NEBU
2.5000 mg | INHALATION_SOLUTION | Freq: Once | RESPIRATORY_TRACT | Status: DC | PRN
  Filled 2021-05-17: qty 3

## 2021-05-17 MED ORDER — DIPHENHYDRAMINE HCL 25 MG PO CAPS
50.0000 mg | ORAL_CAPSULE | Freq: Once | ORAL | Status: AC
  Administered 2021-05-18: 50 mg via ORAL
  Filled 2021-05-17: qty 2

## 2021-05-17 MED ORDER — SODIUM CHLORIDE 0.9 % IV SOLN
1000.0000 mg | Freq: Once | INTRAVENOUS | Status: AC
  Administered 2021-05-18: 1000 mg via INTRAVENOUS
  Filled 2021-05-17: qty 100

## 2021-05-17 MED ORDER — EPINEPHRINE PF 1 MG/ML IJ SOLN
0.3000 mg | INTRAMUSCULAR | Status: DC | PRN
  Filled 2021-05-17: qty 1

## 2021-05-17 MED ORDER — MAGNESIUM SULFATE 2 GM/50ML IV SOLN
2.0000 g | Freq: Once | INTRAVENOUS | Status: AC
  Administered 2021-05-17: 2 g via INTRAVENOUS
  Filled 2021-05-17: qty 50

## 2021-05-17 MED ORDER — EPOETIN ALFA 4000 UNIT/ML IJ SOLN
INTRAMUSCULAR | Status: AC
  Filled 2021-05-17: qty 1

## 2021-05-17 MED ORDER — ACETAMINOPHEN 325 MG PO TABS
650.0000 mg | ORAL_TABLET | Freq: Once | ORAL | Status: AC
  Administered 2021-05-18: 650 mg via ORAL
  Filled 2021-05-17: qty 2

## 2021-05-17 MED ORDER — DIPHENHYDRAMINE HCL 50 MG/ML IJ SOLN: 50.0000 mg | Freq: Once | INTRAMUSCULAR | Status: DC | PRN

## 2021-05-17 NOTE — Progress Notes (Signed)
?[facility]  ?ROUNDING NOTE  ? ?Subjective:  ? 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   ? ? Patient has been admitted for Hemorrhagic shock ([place]) [R57.8] ?GIB (gastrointestinal bleeding) [K92.2] ?Upper GI bleed [K92.2] ? ? ?Update: ?Patient seen and evaluated during dialysis ?  ?HEMODIALYSIS FLOWSHEET: ? ?Blood Flow Rate (mL/min): 300 mL/min ?Arterial Pressure (mmHg): -170 mmHg ?Venous Pressure (mmHg): 80 mmHg ?Transmembrane Pressure (mmHg): 50 mmHg ?Ultrafiltration Rate (mL/min): 500 mL/min ?Dialysate Flow Rate (mL/min): 500 ml/min ?Conductivity: Machine : 13.7 ?Conductivity: Machine : 13.7 ?Dialysis Fluid Bolus: Normal Saline ?Bolus Amount (mL): 250 mL ?Dialysate Change: Other (comment) (3.0 k) ? ?No complaints at this time ? ? ?Objective:  ?Vital signs in last 24 hours:  ?Temp:  [97.7 ?F (36.5 ?C)-98.3 ?F (36.8 ?C)] 97.8 ?F (36.6 ?C) (04/24 9758) ?Pulse Rate:  [56-150] 84 (04/24 1130) ?Resp:  [15-20] 16 (04/24 1130) ?BP: (113-186)/(72-149) 122/105 (04/24 1130) ?SpO2:  [91 %-97 %] 95 % (04/24 0743) ?Weight:  [96.4 kg-96.6 kg] 96.6 kg (04/24 0958) ? ?Weight change: 0.4 kg ?Filed Weights  ? 05/16/21 0500 05/17/21 0351 05/17/21 0958  ?Weight: 96 kg 96.4 kg 96.6 kg  ? ? ?Intake/Output: ?I/O last 3 completed shifts: ?In: 996.7 [P.O.:500; I.V.:21.6; Blood:365; IV Piggyback:110.1] ?Out: 1266 [Urine:1266] ?  ?Intake/Output this shift: ? Total I/O ?In: 240 [P.O.:240] ?Out: -  ? ?Physical Exam: ?General: NAD  ?Head: Normocephalic, atraumatic.    ?Eyes: Anicteric  ?Lungs:  Clear to auscultation, normal effort  ?Heart: Irregular rate and rhythm  ?Abdomen:  Soft, nontender, nondistended  ?Extremities: + peripheral edema.  ?Neurologic: Alert, oriented    ?Skin: Warm, dry  ?Access: Rt permcath placed on 04/22/21 by Dr  Lucky Cowboy  ? ? ?Basic Metabolic Panel: ?Recent Labs  ?Lab 05/13/21 ?0532 05/14/21 ?0434 05/15/21 ?0113 05/16/21 ?8325 05/17/21 ?0543  ?NA 136 138 136 137 138  ?K 3.6 3.4* 3.0* 3.2* 3.3*  ?CL 104 104 102 107 106  ?CO2 25 24 26 24 23   ?GLUCOSE 125* 122* 88 140* 90  ?BUN 90* 112* 61* 77* 89*  ?CREATININE 3.68* 4.80* 3.59* 4.83* 5.59*  ?CALCIUM 6.8* 7.2* 7.3* 6.8* 6.8*  ?MG 1.7 2.2 2.0 1.9 1.9  ?PHOS 4.9* 5.5* 4.4 5.0* 5.0*  ? ? ? ?Liver Function Tests: ?Recent Labs  ?Lab 05/11/21 ?4982 05/12/21 ?6415  ?AST 23 26  ?ALT 18 35  ?ALKPHOS 31* 31*  ?BILITOT 0.5 1.2  ?PROT 3.7* 3.9*  ?ALBUMIN 1.8* 2.0*  ? ? ?No results for input(s): LIPASE, AMYLASE in the last 168 hours. ? ?No results for input(s): AMMONIA in the last 168 hours. ? ?CBC: ?Recent Labs  ?Lab 05/11/21 ?8309 05/11/21 ?1316 05/13/21 ?0532 05/13/21 ?1431 05/14/21 ?0434 05/14/21 ?0744 05/15/21 ?0113 05/15/21 ?0422 05/15/21 ?1750 05/16/21 ?4076 05/17/21 ?0543  ?WBC 17.7*   < > 13.9*  --  9.9  --  7.7  --   --  6.0 6.4  ?NEUTROABS 13.7*  --   --   --   --   --  5.8  --   --  4.0 3.8  ?HGB 4.1*   < > 6.3*   < > 6.8*   < > 9.0* 8.7* 9.0* 8.3* 8.1*  ?HCT 13.1*   < > 18.7*   < > 20.1*   < > 26.9* 25.5* 27.0* 25.3* 24.9*  ?MCV 92.3   < >  92.1  --  91.4  --  91.8  --   --  95.5 95.8  ?PLT 149*   < > 96*  --  106*  --  104*  --   --  120* 130*  ? < > = values in this interval not displayed.  ? ? ? ?Cardiac Enzymes: ?No results for input(s): CKTOTAL, CKMB, CKMBINDEX, TROPONINI in the last 168 hours. ? ?BNP: ?Invalid input(s): POCBNP ? ?CBG: ?Recent Labs  ?Lab 05/11/21 ?0820 05/15/21 ?6606  ?GLUCAP 95 71  ? ? ? ?Microbiology: ?Results for orders placed or performed during the hospital encounter of 05/11/21  ?Culture, blood (Routine X 2) w Reflex to ID Panel     Status: None  ? Collection Time: 05/11/21  1:16 PM  ? Specimen: BLOOD  ?Result Value Ref Range Status  ? Specimen Description BLOOD LEFT HAND  Final  ? Special Requests   Final  ?  BOTTLES DRAWN AEROBIC AND ANAEROBIC Blood  Culture adequate volume  ? Culture   Final  ?  NO GROWTH 5 DAYS ?Performed at [facility], [address] [place], [place] [address] ?  ? Report Status 05/16/2021 FINAL  Final  ?Culture, blood (Routine X 2) w Reflex to ID Panel     Status: None  ? Collection Time: 05/11/21  1:16 PM  ? Specimen: BLOOD  ?Result Value Ref Range Status  ? Specimen Description BLOOD RIGHT HAND  Final  ? Special Requests   Final  ?  BOTTLES DRAWN AEROBIC ONLY Blood Culture results may not be optimal due to an inadequate volume of blood received in culture bottles  ? Culture   Final  ?  NO GROWTH 5 DAYS ?Performed at [facility], [address] [place], [place] [address] ?  ? Report Status 05/16/2021 FINAL  Final  ?MRSA Next Gen by PCR, Nasal     Status: None  ? Collection Time: 05/11/21  7:00 PM  ? Specimen: Nasal Mucosa; Nasal Swab  ?Result Value Ref Range Status  ? MRSA by PCR Next Gen NOT DETECTED NOT DETECTED Final  ?  Comment: (NOTE) ?The GeneXpert MRSA Assay (FDA approved for NASAL specimens only), ?is one component of a comprehensive MRSA colonization surveillance ?program. It is not intended to diagnose MRSA infection nor to guide ?or monitor treatment for MRSA infections. ?Test performance is not FDA approved in patients less than 2 years ?old. ?Performed at [facility], [place], ?[place] [address] ?  ? ? ?Coagulation Studies: ?No results for input(s): LABPROT, INR in the last 72 hours. ? ? ?Urinalysis: ?No results for input(s): COLORURINE, LABSPEC, [place], GLUCOSEU, HGBUR, BILIRUBINUR, KETONESUR, PROTEINUR, UROBILINOGEN, NITRITE, LEUKOCYTESUR in the last 72 hours. ? ?Invalid input(s): APPERANCEUR ?  ? ? ?Imaging: ?No results found. ? ? ?Medications:  ? ? sodium chloride Stopped (05/15/21 2345)  ? magnesium sulfate bolus IVPB    ? ? sodium chloride   Intravenous Once  ? Chlorhexidine Gluconate Cloth  6 each Topical Q0600  ? metoprolol tartrate  25 mg Oral BID  ? pantoprazole  40  mg Oral BID  ? predniSONE  60 mg Oral Q breakfast  ? sodium chloride flush  10-40 mL Intracatheter Q12H  ? sulfamethoxazole-trimethoprim  1 tablet Oral Once per day on Mon Wed Fri  ? ?docusate sodium, metoprolol tartrate, polyethylene glycol, sodium chloride flush ? ?Assessment/ Plan:  ?Blake West is a 71 y.o.  male with past medical conditions including hypertension,  chronic heart failure with reduced ejection fraction, biventricular heart failure, chronic kidney disease stage IV with glomerulonephritis.  Patient presents to the emergency department with complaints of nausea, vomiting, generalized decline.  Patient has been admitted for Hemorrhagic shock ([place]) [R57.8] ?GIB (gastrointestinal bleeding) [K92.2] ?Upper GI bleed [K92.2] ? ?[facility], MWF, right PermCath ? ?Acute Kidney Injury with hyperkalemia on chronic kidney disease stage IV with baseline creatinine 3.0 and GFR of 21 on 02/17/21.  ?AKI multifactorial with contributions from ANCA vasculitis (RPGN) (N01.7) and ATN.  ?  Chronic kidney disease is secondary to diabetes, NSAID use and hypertension.  ? ?CT abdomen pelvis negative for obstruction.  No exposure to IV contrast.  Patient received initial dialysis 04/22/21.  ?QuantiFERON-TB gold obtained on 04/22/21 ?Renal biopsy 04/26/21. : ANCA related disease, 38% fibrous-cellular crescents, 50% interstitial fibrosis with moderate to severe scarring and ATN.  ? ?Given rituximab 1 gm on 04/30/2021 ?Was on Prednisone 60 mg daily ?Bactrim DS three times per week ? ?-Next dialysis treatment will be Wednesday ?- Will receive Rituximab 1 g infusion Tuesday ?- Prednisone 60mg  restarted today ? ? ?Lab Results  ?Component Value Date  ? CREATININE 5.59 (H) 05/17/2021  ? CREATININE 4.83 (H) 05/16/2021  ? CREATININE 3.59 (H) 05/15/2021  ? ? ?Intake/Output Summary (Last 24 hours) at 05/17/2021 1137 ?Last data filed at 05/17/2021 0749 ?Gross per 24 hour  ?Intake 500 ml  ?Output 941 ml  ?Net -441 ml   ? ? ? ?2.   Anemia of acute GI blood loss and chronic kidney disease ? EGD showed cratered duodenal ulcer (4/20) ?-Colonoscopy completed 05/15/21 showed diverticulosis and hemorrhoids, no active bleeding. ?--H

## 2021-05-17 NOTE — Care Management Important Message (Signed)
Important Message ? ?Patient Details  ?Name: Blake West ?MRN: [id number] ?Date of Birth: [birth year] ? ? ?Medicare Important Message Given:  Yes ? ? ? ? ?Dannette Barbara ?05/17/2021, 12:09 PM ?

## 2021-05-17 NOTE — Progress Notes (Signed)
Hemodialysis Post Treatment Note ? ?17 May 2021 ? ?Access: RIJ CVC ? ?Scheduled Treatment Time: 3 hrs. ? ?UF Removed: 1004 ml ? ?Next Scheduled Treatment: 05/19/21 ? ?Note: ? ?Patient tolerates treatment without incident, targeted UF reached, CVC functions well, BFR maintained. Patient without concerns. Patient returns to assigned room.  ?

## 2021-05-17 NOTE — Progress Notes (Addendum)
PT Cancellation Note ? ?Patient Details ?Name: JOFFRE LUCKS ?MRN: [id number] ?DOB: 1951-01-08 ? ? ?Cancelled Treatment:    Reason Eval/Treat Not Completed:  (Patient just getting back to bed from sitting up at the bedside for breakfast. He is now waiting to go to hemodialysis this morning. PT will try again in the afternoon as schedule allows.) ? ?Minna Merritts, PT, MPT ? ?Percell Locus ?05/17/2021, 8:49 AM ?

## 2021-05-17 NOTE — Progress Notes (Signed)
?Progress Note ? ? ?Patient: Blake West BPZ:[id number] DOB: Dec 20, 1950 DOA: 05/11/2021     6 ?DOS: the patient was seen and examined on 05/17/2021 ?  ?Brief hospital course: ?71 year old male with past medical history of CKD stage IV with glomerulonephritis, ANCA associated vasculitis, recently started on hemodialysis during recent admission, HFrEF (EF 40-45%), A-fib on Eliquis who was admitted to the ICU on 05/11/2021 with hemorrhagic shock in the setting of acute GI bleed.  He reported black tarry stools for 2 days at home, cramping abdominal discomfort prior to BMs, as well as dyspnea on exertion and intermittent dizziness. ? ?Recent admission 3-27 through 04/30/2021, sent by his nephrologist due to hyperkalemia and renal failure with creatinine of 15.  He was started on dialysis 04/22/2021.  He was also treated for multifocal pneumonia that admission.  That admission also complicated by urinary retention which has resolved.  He was treated with IV Solu-Medrol and transition to prednisone for ANCA associated vasculitis.  Started on rituximab.  Being followed by pulmonary and rheumatology.  On prophylactic Bactrim 3 times weekly. ? ?ED course --- EDP reported grossly positive black stool, guaiac positive.  BP 84/47, HR 114, RR 20, afebrile without hypoxia.  Significant labs:  Glucose 138, BUN 107, creatinine 3.75, calcium 6.8, high-sensitivity troponin 63, lactic acid 5.0, WBC 17.7, hemoglobin 4.1, hematocrit 13.1, platelets 149, PT 21, INR 1.8.   ? ?Patient was started on IV Protonix, fluid resuscitated, treated with Kcentra and emergently transfused 1 unit PRBCs.  Due to hemorrhagic shock, Levophed drip was started and patient was admitted to ICU. ? ?Gastroenterology consulted and patient was taken for EGD on 4/20 which revealed 1 nonbleeding cratered duodenal ulcer with a clean ulcer base in the second portion of the duodenum 15 mm in largest dimension.   ?Colonoscopy was performed 05/15/2021 which showed  diverticulosis in the rectosigmoid and sigmoid colon, nonbleeding external hemorrhoids, otherwise normal exam. ? ?In total, 6 units PRBCs transfused between 4/18 and 4/21. ?Hemoglobin now stable 8's to 9.0 since 4/22. ? ?TRH assumed care on 05/16/2021.  Patient off pressors and stable for transfer out to PCU. ? ? ? ? ? ? ? ? ?Assessment and Plan: ?* Hemorrhagic shock ([place]) ?Resolved.  Patient required Levophed in the setting of GI bleed.  BP is now stable off pressors.  Monitor.  Maintain MAP above 65. ? ?Acute blood loss anemia ([facility]) ?Status post transfusion of 2 units PRBCs total since admission.  Secondary to duodenal ulcer. ?-- Monitor CBC ?-- Transfuse for hemoglobin less than 7 or signs of active bleeding ? ?Hematochezia ?Likely due to bleeding duodenal ulcer.  No bleeding source was seen on colonoscopy. ?Resolved. ? ?GIB (gastrointestinal bleeding) ?Appears due to duodenal ulcer which was seen on EGD, see report.  Bleeding appears resolved with stable hemoglobin since 4/22. ?--Appreciate GI recommendations ?-- Continue PPI twice daily indefinitely ? ?ESRD (end stage renal disease) on dialysis [facility]) ?Recently started on dialysis in setting of ANCA vasculitis/RPGN.   ?--Nephrology is following. ?-- Dialysis schedule MWF ?-- Access is right PermCath ? ?Rapidly progressive nephritic syndrome, diffuse crescentic glomerulonephritis ?C ANCA vasculitis. ?Nephrology following. ?On dialysis. ? ?ANCA-associated vasculitis ([place]) ?Renal biopsy confirmed on 04/26/2021. ?On rituximab, last dose given 04/30/2021. ?Has been on prednisone 60 mg daily. ?On prophylactic Bactrim 3 times weekly. ?Nephrology following. ? ?Chronic atrial fibrillation with RVR ([place]) ?Eliquis on hold due to GI bleeding and anemia requiring multiple blood transfusions. ?Labile heart rates, intermittently in RVR. ?-- Continue metoprolol 25 mg  twice daily ?-- IV metoprolol pushes as needed ?-- Telemetry ?-- Adjust rate control agents as needed ? ?Chronic  HFrEF (heart failure with reduced ejection fraction) ([place]) ?Appears euvolemic.  Continue metoprolol ? ?Leukocytosis ?Likely reactive in the setting of bleeding.  Patient is also on steroids for ANCA vasculitis. ?Patient had multifocal pneumonia recently, now recovered. ?Monitor for fevers or other evidence of infection. ?Follow CBCs. ? ?Essential hypertension ?And hemorrhagic shock on admission.   ?BPs now stable. ?Resumed on home metoprolol. ?Monitor for hypotension. ?Adjust antihypertensives as needed ? ?Hypokalemia ?K today 3.2, replacing.  ?Cautious replacement given renal failure. ?Monitor BMP. ? ?Chronic anticoagulation ?On Eliquis for A-fib.  Presented with GI bleeding.  Eliquis on hold. ? ?SIRS (systemic inflammatory response syndrome) (HCC) ?Patient met SIRS criteria on admission due to tachycardia, leukocytosis and hypotension, lactic 5.0.  Patient had recently been treated for multifocal pneumonia 3/27 through 4/7, completed full course of Rocephin and Zithromax.  Remains on Bactrim prophylaxis. ?-- No evidence of infection at this time ?-- Stop empiric Flagyl ?-- Monitor fever curve, CBC, follow cultures ? ?Vitamin D deficiency ?. ? ?Type 2 diabetes mellitus ([place]) ?Well-controlled, last A1c 5.9%. ?Appears not on medications outpatient. ?Monitor fasting glucose for now. ?Start sliding scale insulin if consistently above 180. ? ? ? ? ?  ? ?Subjective: Patient seen during dialysis today.  He reports feeling weak and needing to work with therapy again.  States he is not sure how he will do up on his feet.  Otherwise denies any acute complaints at this time.  Overall feeling better. ? ?Physical Exam: ?Vitals:  ? 05/17/21 1300 05/17/21 1302 05/17/21 1314 05/17/21 1402  ?BP: 129/83 124/86  137/78  ?Pulse: (!) 112 85 (!) 112 (!) 101  ?Resp:    16  ?Temp:  98.3 ?F (36.8 ?C)  98.2 ?F (36.8 ?C)  ?TempSrc:  Oral  Oral  ?SpO2:    94%  ?Weight:  95.3 kg    ?Height:      ? ?General exam: awake, alert, no acute  distress ?HEENT: moist mucus membranes, hearing grossly normal  ?Respiratory system: CTAB, no wheezes, rales or rhonchi, normal respiratory effort. ?Cardiovascular system: normal S1/S2, RRR, no pedal edema.   ?Central nervous system: A&O x3. no gross focal neurologic deficits, normal speech ?Extremities: moves all, no cyanosis, normal tone ?Skin: dry, intact, normal temperature ?Psychiatry: normal mood, congruent affect, judgement and insight appear normal ? ? ? ?Data Reviewed: ? ?Notable labs: K3.3, BUN 89, creatinine 5.59, calcium 6.8, phosphorus 5.0, GFR 10, hemoglobin 8.1 from 8.3 ? ?Family Communication: None present in dialysis when seen ? ?Disposition: ?Status is: Inpatient ?Remains inpatient appropriate because: Electrolyte abnormalities, requires further work with PT/OT to evaluate discharge needs.  Discharge pending clearance by nephrology. ? ? ? Planned Discharge Destination: Home with Home Health ? ? ? ?Time spent: 35 minutes ? ?Author: ?Ezekiel Slocumb, DO ?05/17/2021 3:01 PM ? ?For on call review [email or web address].  ?

## 2021-05-17 NOTE — Progress Notes (Signed)
PHARMACY CONSULT NOTE - FOLLOW UP ? ?Pharmacy Consult for Electrolyte Monitoring and Replacement  ? ?Recent Labs: ?Potassium (mmol/L)  ?Date Value  ?05/17/2021 3.3 (L)  ? ?Magnesium (mg/dL)  ?Date Value  ?05/17/2021 1.9  ? ?Calcium (mg/dL)  ?Date Value  ?05/17/2021 6.8 (L)  ? ?Albumin (g/dL)  ?Date Value  ?05/12/2021 2.0 (L)  ? ?Phosphorus (mg/dL)  ?Date Value  ?05/17/2021 5.0 (H)  ? ?Sodium (mmol/L)  ?Date Value  ?05/17/2021 138  ?06/26/2019 138  ? ?Corrected Calcium: 8.4 mg/dl ? ?Assessment: ?71 year old male presenting with melena. PMH includes ESRD on HD MWF, atrial fibrillation on Eliquis 5 mg BID (unknown last dose), glomerulonephritis, HFrEF EF 40-45%, hypothyroidism, hypertension, type 2 diabetes mellitus. Admitted with acute GI bleed.  ? ?Goal of Therapy:  ?K 4-5 ?Mag > 2 ?All other electrolytes within normal limits ? ?Plan:  ?K: 3.2>3.3 after KCL 20 mEq PO x1.  ?Will replete with KCL 57mEq PO x1, since HD scheduled today. ?Mg 2>1.9 ?Will replete with MgSO4 2g IV x1. ?No other repletion at this time. ?Follow up electrolytes in the AM ? ?Lorna Dibble, PharmD, BCCP ?Clinical Pharmacist ?05/17/2021 7:31 AM ? ?

## 2021-05-17 NOTE — Progress Notes (Signed)
Physical Therapy Treatment ?Patient Details ?Name: Blake West ?MRN: [id number] ?DOB: [birth year] ?Today's Date: 05/17/2021 ? ? ?History of Present Illness Patient is a 71 y.o male with PMH significant for chronic kidney disease stage IV with glomerulonephritis,  ANCA-associated vasculitis, and HFrEF (LVEF 40-45%), Atrial fibrillation on Eliquis who is  admitted 05/11/2021 with Hemorrhagic Shock in the setting of Acute GI Bleed. ? ?  ?PT Comments  ? ? Patient agreeable to PT following dialysis. He performed multiple transfers this session including to recliner chair and back to bed. No physical lifting assistance required for transfers, Min guard for safety. Patient needs rest breaks between bouts of standing and LE exercises. Limited activity tolerance overall and patient fatigued from dialysis, however increased independence with mobility this session. PT will continue to follow. Spouse is very supportive and will provide assistance at home with DME in place already. ?  ?Recommendations for follow up therapy are one component of a multi-disciplinary discharge planning process, led by the attending physician.  Recommendations may be updated based on patient status, additional functional criteria and insurance authorization. ? ?Follow Up Recommendations ? Home health PT ?  ?  ?Assistance Recommended at Discharge Intermittent Supervision/Assistance  ?Patient can return home with the following A little help with walking and/or transfers;A lot of help with bathing/dressing/bathroom;Help with stairs or ramp for entrance;Assist for transportation ?  ?Equipment Recommendations ? None recommended by PT  ?  ?Recommendations for Other Services   ? ? ?  ?Precautions / Restrictions Precautions ?Precautions: Fall ?Restrictions ?Weight Bearing Restrictions: No  ?  ? ?Mobility ? Bed Mobility ?Overal bed mobility: Needs Assistance ?Bed Mobility: Supine to Sit, Sit to Supine ?  ?  ?Supine to sit: Supervision ?Sit to supine:  Supervision ?  ?General bed mobility comments: no physical assistance required for bed mobility. cues for task initiation. patient is using bed rails for support ?  ? ?Transfers ?Overall transfer level: Needs assistance ?Equipment used: Rolling walker (2 wheels) ?Transfers: Sit to/from Stand, Bed to chair/wheelchair/BSC ?Sit to Stand: Min guard ?  ?Step pivot transfers: Min guard ?  ?  ?  ?General transfer comment: verbal cues for technique for standing for independence and safety. multiple standing bouts performed this session. ?  ? ?Ambulation/Gait ?  ?  ?  ?  ?  ?  ?  ?General Gait Details: did not progress ambulation due to fatigue with activity, although patient has improved independence with transfers this session. ? ? ?Stairs ?  ?  ?  ?  ?  ? ? ?Wheelchair Mobility ?  ? ?Modified Rankin (Stroke Patients Only) ?  ? ? ?  ?Balance Overall balance assessment: Needs assistance ?Sitting-balance support: Feet supported ?Sitting balance-Leahy Scale: Good ?  ?  ?Standing balance support: Bilateral upper extremity supported ?Standing balance-Leahy Scale: Fair ?Standing balance comment: patient relying on rolling walker for support in standing ?  ?  ?  ?  ?  ?  ?  ?  ?  ?  ?  ?  ? ?  ?Cognition Arousal/Alertness: Awake/alert ?Behavior During Therapy: [facility] for tasks assessed/performed ?Overall Cognitive Status: Within Functional Limits for tasks assessed ?  ?  ?  ?  ?  ?  ?  ?  ?  ?  ?  ?  ?  ?  ?  ?  ?  ?  ?  ? ?  ?Exercises General Exercises - Lower Extremity ?Ankle Circles/Pumps: AROM, Strengthening, Both, 10 reps, Seated ?Long Arc Quad: AAROM, Hotel manager,  Both, 10 reps, Seated ?Hip ABduction/ADduction: AAROM, Strengthening, Both, 10 reps, Seated ?Hip Flexion/Marching: AROM, Strengthening, Both, Seated (x 2 reps each) ?Other Exercises ?Other Exercises: verbal cues for exercise technique ? ?  ?General Comments General comments (skin integrity, edema, etc.): patient was up in the recliner chair to eat lunch and to  complete LE exercises. he was fatigued with activity and ultimately requested to return to bed to rest. ?  ?  ? ?Pertinent Vitals/Pain Pain Assessment ?Pain Assessment: No/denies pain  ? ? ?Home Living   ?  ?  ?  ?  ?  ?  ?  ?  ?  ?   ?  ?Prior Function    ?  ?  ?   ? ?PT Goals (current goals can now be found in the care plan section) Acute Rehab PT Goals ?Patient Stated Goal: to get better ?PT Goal Formulation: With patient/family ?Time For Goal Achievement: 05/30/21 ?Potential to Achieve Goals: Good ?Progress towards PT goals: Progressing toward goals ? ?  ?Frequency ? ? ? Min 2X/week ? ? ? ?  ?PT Plan Current plan remains appropriate  ? ? ?Co-evaluation   ?  ?  ?  ?  ? ?  ?AM-PAC PT "6 Clicks" Mobility   ?Outcome Measure ? Help needed turning from your back to your side while in a flat bed without using bedrails?: A Little ?Help needed moving from lying on your back to sitting on the side of a flat bed without using bedrails?: A Little ?Help needed moving to and from a bed to a chair (including a wheelchair)?: A Little ?Help needed standing up from a chair using your arms (e.g., wheelchair or bedside chair)?: A Little ?Help needed to walk in hospital room?: A Little ?Help needed climbing 3-5 steps with a railing? : A Lot ?6 Click Score: 17 ? ?  ?End of Session   ?Activity Tolerance: Patient tolerated treatment well ?Patient left: in bed;with call bell/phone within reach;with bed alarm set ?Nurse Communication: Mobility status ?PT Visit Diagnosis: Unsteadiness on feet (R26.81);Muscle weakness (generalized) (M62.81);Difficulty in walking, not elsewhere classified (R26.2) ?  ? ? ?Time: 9741-6384 ?PT Time Calculation (min) (ACUTE ONLY): 39 min ? ?Charges:  $Therapeutic Exercise: 8-22 mins ?$Therapeutic Activity: 23-37 mins          ?          ? ?Minna Merritts, PT, MPT ? ? ? ?Percell Locus ?05/17/2021, 3:47 PM ? ?

## 2021-05-17 NOTE — Plan of Care (Signed)
?  Problem: Education: ?Goal: Knowledge of General Education information will improve ?Description: Including pain rating scale, medication(s)/side effects and non-pharmacologic comfort measures ?Outcome: Progressing ?  ?Problem: Clinical Measurements: ?Goal: Ability to maintain clinical measurements within normal limits will improve ?Outcome: Progressing ?Goal: Respiratory complications will improve ?Outcome: Progressing ?  ?Problem: Activity: ?Goal: Risk for activity intolerance will decrease ?Outcome: Progressing ?  ?Problem: Nutrition: ?Goal: Adequate nutrition will be maintained ?Outcome: Progressing ?  ?Problem: Coping: ?Goal: Level of anxiety will decrease ?Outcome: Progressing ?  ?Problem: Pain Managment: ?Goal: General experience of comfort will improve ?Outcome: Progressing ?  ?Problem: Safety: ?Goal: Ability to remain free from injury will improve ?Outcome: Progressing ?  ?Problem: Skin Integrity: ?Goal: Risk for impaired skin integrity will decrease ?Outcome: Progressing ?  ?

## 2021-05-18 LAB — CBC
HCT: 24.7 % — ABNORMAL LOW (ref 39.0–52.0)
Hemoglobin: 8.2 g/dL — ABNORMAL LOW (ref 13.0–17.0)
MCH: 31.7 pg (ref 26.0–34.0)
MCHC: 33.2 g/dL (ref 30.0–36.0)
MCV: 95.4 fL (ref 80.0–100.0)
Platelets: 137 10*3/uL — ABNORMAL LOW (ref 150–400)
RBC: 2.59 MIL/uL — ABNORMAL LOW (ref 4.22–5.81)
RDW: 17.6 % — ABNORMAL HIGH (ref 11.5–15.5)
WBC: 6.6 10*3/uL (ref 4.0–10.5)
nRBC: 0 % (ref 0.0–0.2)

## 2021-05-18 LAB — BASIC METABOLIC PANEL
Anion gap: 6 (ref 5–15)
BUN: 59 mg/dL — ABNORMAL HIGH (ref 8–23)
CO2: 24 mmol/L (ref 22–32)
Calcium: 6.8 mg/dL — ABNORMAL LOW (ref 8.9–10.3)
Chloride: 106 mmol/L (ref 98–111)
Creatinine, Ser: 4.2 mg/dL — ABNORMAL HIGH (ref 0.61–1.24)
GFR, Estimated: 14 mL/min — ABNORMAL LOW (ref >60)
Glucose, Bld: 102 mg/dL — ABNORMAL HIGH (ref 70–99)
Potassium: 3.5 mmol/L (ref 3.5–5.1)
Sodium: 136 mmol/L (ref 135–145)

## 2021-05-18 LAB — PREPARE RBC (CROSSMATCH)

## 2021-05-18 LAB — MAGNESIUM: Magnesium: 1.8 mg/dL (ref 1.7–2.4)

## 2021-05-18 LAB — PHOSPHORUS: Phosphorus: 4.7 mg/dL — ABNORMAL HIGH (ref 2.5–4.6)

## 2021-05-18 MED ORDER — METOPROLOL SUCCINATE ER 50 MG PO TB24
50.0000 mg | ORAL_TABLET | Freq: Every day | ORAL | 1 refills | Status: DC
Start: 2021-05-18 — End: 2021-07-09

## 2021-05-18 MED ORDER — PANTOPRAZOLE SODIUM 40 MG PO TBEC: 40.0000 mg | DELAYED_RELEASE_TABLET | Freq: Two times a day (BID) | ORAL | 1 refills | Status: AC

## 2021-05-18 NOTE — Progress Notes (Signed)
Discharged home with wife avs given ? ?

## 2021-05-18 NOTE — Discharge Summary (Signed)
?Physician Discharge Summary ?  ?Patient: Blake West MRN: [id number] DOB: [birth year]  ?Admit date:     05/11/2021  ?Discharge date: 05/30/21  ?Discharge Physician: Ezekiel Slocumb  ? ?PCP: Leonel Ramsay, MD  ? ?Recommendations at discharge:  ? ? Follow up with Primary Care in 1 week ?Repeat CBC, BMP, Mg in 1 week ?Consider resuming Eliquis once anemia has further improved, if benefits outweigh risks ?Follow up with nephrology and dialysis as scheduled ? ?Discharge Diagnoses: ?Principal Problem: ?  Hemorrhagic shock ([place]) ?Active Problems: ?  ESRD (end stage renal disease) on dialysis [facility]) ?  Acute blood loss anemia ([facility]) ?  ANCA-associated vasculitis ([place]) ?  Rapidly progressive nephritic syndrome, diffuse crescentic glomerulonephritis ?  Chronic HFrEF (heart failure with reduced ejection fraction) ([place]) ?  Chronic atrial fibrillation with RVR (HCC) ?  Essential hypertension ?  Type 2 diabetes mellitus ([place]) ?  Vitamin D deficiency ?  Chronic anticoagulation ? ?Resolved Problems: ?  GIB (gastrointestinal bleeding) ?  Hematochezia ?  Leukocytosis ?  SIRS (systemic inflammatory response syndrome) (HCC) ?  Hypokalemia ? ?Hospital Course: ?71 year old male with past medical history of CKD stage IV with glomerulonephritis, ANCA associated vasculitis, recently started on hemodialysis during recent admission, HFrEF (EF 40-45%), A-fib on Eliquis who was admitted to the ICU on 05/11/2021 with hemorrhagic shock in the setting of acute GI bleed.  He reported black tarry stools for 2 days at home, cramping abdominal discomfort prior to BMs, as well as dyspnea on exertion and intermittent dizziness. ? ?Recent admission 3-27 through 04/30/2021, sent by his nephrologist due to hyperkalemia and renal failure with creatinine of 15.  He was started on dialysis 04/22/2021.  He was also treated for multifocal pneumonia that admission.  That admission also complicated by urinary retention which has resolved.  He was treated with  IV Solu-Medrol and transition to prednisone for ANCA associated vasculitis.  Started on rituximab.  Being followed by pulmonary and rheumatology.  On prophylactic Bactrim 3 times weekly. ? ?ED course --- EDP reported grossly positive black stool, guaiac positive.  BP 84/47, HR 114, RR 20, afebrile without hypoxia.  Significant labs:  Glucose 138, BUN 107, creatinine 3.75, calcium 6.8, high-sensitivity troponin 63, lactic acid 5.0, WBC 17.7, hemoglobin 4.1, hematocrit 13.1, platelets 149, PT 21, INR 1.8.   ? ?Patient was started on IV Protonix, fluid resuscitated, treated with Kcentra and emergently transfused 1 unit PRBCs.  Due to hemorrhagic shock, Levophed drip was started and patient was admitted to ICU. ? ?Gastroenterology consulted and patient was taken for EGD on 4/20 which revealed 1 nonbleeding cratered duodenal ulcer with a clean ulcer base in the second portion of the duodenum 15 mm in largest dimension.   ?Colonoscopy was performed 05/15/2021 which showed diverticulosis in the rectosigmoid and sigmoid colon, nonbleeding external hemorrhoids, otherwise normal exam. ? ?In total, 6 units PRBCs transfused between 4/18 and 4/21. ?Hemoglobin now stable 8's to 9.0 since 4/22. ? ?TRH assumed care on 05/16/2021.  Patient off pressors and stable for transfer out to PCU. ? ? ? ? ? ? ? ? ?Assessment and Plan: ?* Hemorrhagic shock ([place]) ?Resolved.  Patient required Levophed in the setting of GI bleed.  BP is now stable off pressors.  Monitor.  Maintain MAP above 65. ? ?Acute blood loss anemia ([facility]) ?Status post transfusion of 2 units PRBCs total since admission.  Secondary to duodenal ulcer. ?-- Monitor CBC ?-- Transfuse for hemoglobin less than 7 or signs of active bleeding ? ?  ESRD (end stage renal disease) on dialysis [facility]) ?Recently started on dialysis in setting of ANCA vasculitis/RPGN.   ?--Nephrology is following. ?-- Dialysis schedule MWF ?-- Access is right PermCath ? ?Hematochezia-resolved as of  05/30/2021 ?Likely due to bleeding duodenal ulcer.  No bleeding source was seen on colonoscopy. ?Resolved. ? ?GIB (gastrointestinal bleeding)-resolved as of 05/30/2021 ?Appears due to duodenal ulcer which was seen on EGD, see report.  Bleeding appears resolved with stable hemoglobin since 4/22. ?--Appreciate GI recommendations ?-- Continue PPI twice daily indefinitely ? ?Rapidly progressive nephritic syndrome, diffuse crescentic glomerulonephritis ?C ANCA vasculitis. ?Nephrology following. ?On dialysis. ? ?ANCA-associated vasculitis ([place]) ?Renal biopsy confirmed on 04/26/2021. ?On rituximab, last dose given 04/30/2021. ?Has been on prednisone 60 mg daily. ?On prophylactic Bactrim 3 times weekly. ?Nephrology following. ? ?Chronic atrial fibrillation with RVR ([place]) ?Eliquis on hold due to GI bleeding and anemia requiring multiple blood transfusions. ?Labile heart rates, intermittently in RVR. ?-- Continue metoprolol 25 mg twice daily ?-- IV metoprolol pushes as needed ?-- Telemetry ?-- Adjust rate control agents as needed ? ?Chronic HFrEF (heart failure with reduced ejection fraction) ([place]) ?Appears euvolemic.  Continue metoprolol ? ?Leukocytosis-resolved as of 05/30/2021 ?Likely reactive in the setting of bleeding.  Patient is also on steroids for ANCA vasculitis. ?Patient had multifocal pneumonia recently, now recovered. ?Monitor for fevers or other evidence of infection. ?Follow CBCs. ? ?Essential hypertension ?And hemorrhagic shock on admission.   ?BPs now stable. ?Resumed on home metoprolol. ?Monitor for hypotension. ?Adjust antihypertensives as needed ? ?Chronic anticoagulation ?On Eliquis for A-fib.  Presented with GI bleeding.  Eliquis on hold. ? ?Vitamin D deficiency ?. ? ?Type 2 diabetes mellitus ([place]) ?Well-controlled, last A1c 5.9%. ?Appears not on medications outpatient. ?Monitor fasting glucose for now. ?Start sliding scale insulin if consistently above 180. ? ?Hypokalemia-resolved as of 05/30/2021 ?K today 3.2,  replacing.  ?Cautious replacement given renal failure. ?Monitor BMP. ? ?SIRS (systemic inflammatory response syndrome) (HCC)-resolved as of 05/30/2021 ?Patient met SIRS criteria on admission due to tachycardia, leukocytosis and hypotension, lactic 5.0.  Patient had recently been treated for multifocal pneumonia 3/27 through 4/7, completed full course of Rocephin and Zithromax.  Remains on Bactrim prophylaxis. ?-- No evidence of infection at this time ?-- Stop empiric Flagyl ?-- Monitor fever curve, CBC, follow cultures ? ? ? ? ?  ? ? ?Consultants: Nephrology, gastroenterology, PCCM ? ?Procedures performed: EGD 05/13/2021 ?Impression:            - Normal esophagus. ?                       - Normal stomach. ?                       - Non-bleeding duodenal ulcer with a clean ulcer base  ?                       ([place] Class III). ?                       - No specimens collected. ? ?Disposition: Home health ?Diet recommendation:  ?Discharge Diet Orders (From admission, onward)  ? ?  Start     Ordered  ? 05/18/21 0000  Diet - low sodium heart healthy       ? 05/18/21 1402  ? ?  ?  ? ?  ? ?Cardiac diet ?DISCHARGE MEDICATION: ?Allergies as of 05/18/2021   ? ?  Reactions  ? Codeine Other (See Comments)  ? Joints hurt  ?Other reaction(s): Other (See Comments) ?Joints hurt  ?Other reaction(s): Other (See Comments) ?Joints hurt  ?Joints hurt   ? ?  ? ?  ?Medication List  ?  ? ?STOP taking these medications   ? ?apixaban 5 MG Tabs tablet ?Commonly known as: ELIQUIS ?  ? ?  ? ?TAKE these medications   ? ?acidophilus Caps capsule ?Take 2 capsules by mouth 3 (three) times daily. ?  ?calcium-vitamin D 500-5 MG-MCG tablet ?Commonly known as: OSCAL WITH D ?Take 1 tablet by mouth. ?  ?magnesium 30 MG tablet ?Take 30 mg by mouth 2 (two) times daily. ?  ?metoprolol succinate 50 MG 24 hr tablet ?Commonly known as: TOPROL-XL ?Take 1 tablet (50 mg total) by mouth daily. ?What changed: how much to take ?  ?omega-3 acid ethyl esters 1 g  capsule ?Commonly known as: LOVAZA ?Take 1 g by mouth 2 (two) times daily. ?  ?pantoprazole 40 MG tablet ?Commonly known as: PROTONIX ?Take 1 tablet (40 mg total) by mouth 2 (two) times daily. ?  ?predniSONE 20 MG tablet ?Com

## 2021-05-18 NOTE — Progress Notes (Signed)
Physical Therapy Treatment ?Patient Details ?Name: Blake West ?MRN: [id number] ?DOB: 01-12-1951 ?Today's Date: 05/18/2021 ? ? ?History of Present Illness Patient is a 71 y.o male with PMH significant for chronic kidney disease stage IV with glomerulonephritis,  ANCA-associated vasculitis, and HFrEF (LVEF 40-45%), Atrial fibrillation on Eliquis who is  admitted 05/11/2021 with Hemorrhagic Shock in the setting of Acute GI Bleed. ? ?  ?PT Comments  ? ? Pt performed well with mobility training and with proper education was able to demonstrate functional ability to ascend/descend stairs.  Pt has two steps at home with bilateral railings that he can reach, and was only able to use the railing on the L side ascending.  Pt did have some difficulty with first step, but once positioning was changed, he was able to perform with much better technique.  Current discharge plans to home with HHPT remain appropriate at this time.  Pt will continue to benefit from skilled therapy in order to address deficits listed below. ?   ?Recommendations for follow up therapy are one component of a multi-disciplinary discharge planning process, led by the attending physician.  Recommendations may be updated based on patient status, additional functional criteria and insurance authorization. ? ?Follow Up Recommendations ? Home health PT ?  ?  ?Assistance Recommended at Discharge Intermittent Supervision/Assistance  ?Patient can return home with the following A little help with walking and/or transfers;A lot of help with bathing/dressing/bathroom;Help with stairs or ramp for entrance;Assist for transportation ?  ?Equipment Recommendations ? None recommended by PT  ?  ?Recommendations for Other Services   ? ? ?  ?Precautions / Restrictions Precautions ?Precautions: Fall ?Restrictions ?Weight Bearing Restrictions: No  ?  ? ?Mobility ? Bed Mobility ?Overal bed mobility: Needs Assistance ?Bed Mobility: Supine to Sit, Sit to Supine ?  ?  ?Supine to  sit: Supervision ?Sit to supine: Supervision ?  ?General bed mobility comments: pt upright in recliner upon arrival to room and returned there. ?  ? ?Transfers ?Overall transfer level: Needs assistance ?Equipment used: Rolling walker (2 wheels) ?Transfers: Sit to/from Stand ?Sit to Stand: Min guard ?  ?Step pivot transfers: Min guard ?  ?  ?  ?General transfer comment: verbal cues for technique for standing for independence and safety. multiple standing bouts performed this session. ?  ? ?Ambulation/Gait ?Ambulation/Gait assistance: Min guard ?Gait Distance (Feet): 30 Feet ?Assistive device: Rolling walker (2 wheels) ?Gait Pattern/deviations: Step-through pattern, Wide base of support ?Gait velocity: decreased ?  ?  ?General Gait Details: Pt able to ambulate around B pod x3 with one standing rest break. ? ? ?Stairs ?Stairs: Yes ?Stairs assistance: Min guard ?Stair Management: One rail Left, Step to pattern, Sideways, Forwards ?Number of Stairs: 4 ?General stair comments: Pt with difficulty with first step, but then able to perform with good technique and education provided. ? ? ?Wheelchair Mobility ?  ? ?Modified Rankin (Stroke Patients Only) ?  ? ? ?  ?Balance Overall balance assessment: Needs assistance ?Sitting-balance support: No upper extremity supported ?Sitting balance-Leahy Scale: Good ?  ?  ?Standing balance support: Bilateral upper extremity supported, During functional activity ?Standing balance-Leahy Scale: Fair ?Standing balance comment: with rolling walker for UE support ?  ?  ?  ?  ?  ?  ?  ?  ?  ?  ?  ?  ? ?  ?Cognition Arousal/Alertness: Awake/alert ?Behavior During Therapy: [facility] for tasks assessed/performed ?Overall Cognitive Status: Within Functional Limits for tasks assessed ?  ?  ?  ?  ?  ?  ?  ?  ?  ?  ?  ?  ?  ?  ?  ?  ?  ?  ?  ? ?  ?  Exercises   ? ?  ?General Comments   ?  ?  ? ?Pertinent Vitals/Pain Pain Assessment ?Pain Assessment: No/denies pain  ? ? ?Home Living   ?  ?  ?  ?  ?  ?  ?  ?  ?   ?   ?  ?Prior Function    ?  ?  ?   ? ?PT Goals (current goals can now be found in the care plan section) Acute Rehab PT Goals ?Patient Stated Goal: to get better ?PT Goal Formulation: With patient/family ?Time For Goal Achievement: 05/30/21 ?Potential to Achieve Goals: Good ?Progress towards PT goals: Progressing toward goals ? ?  ?Frequency ? ? ? Min 2X/week ? ? ? ?  ?PT Plan Current plan remains appropriate  ? ? ?Co-evaluation   ?  ?  ?  ?  ? ?  ?AM-PAC PT "6 Clicks" Mobility   ?Outcome Measure ? Help needed turning from your back to your side while in a flat bed without using bedrails?: A Little ?Help needed moving from lying on your back to sitting on the side of a flat bed without using bedrails?: A Little ?Help needed moving to and from a bed to a chair (including a wheelchair)?: A Little ?Help needed standing up from a chair using your arms (e.g., wheelchair or bedside chair)?: A Little ?Help needed to walk in hospital room?: A Little ?Help needed climbing 3-5 steps with a railing? : A Lot ?6 Click Score: 17 ? ?  ?End of Session Equipment Utilized During Treatment: Gait belt ?Activity Tolerance: Patient tolerated treatment well ?Patient left: in bed;with call bell/phone within reach;with bed alarm set ?Nurse Communication: Mobility status ?PT Visit Diagnosis: Unsteadiness on feet (R26.81);Muscle weakness (generalized) (M62.81);Difficulty in walking, not elsewhere classified (R26.2) ?  ? ? ?Time: 7290-2111 ?PT Time Calculation (min) (ACUTE ONLY): 10 min ? ?Charges:  $Gait Training: 8-22 mins          ?          ? ?Gwenlyn Saran, PT, DPT ?05/18/21, 3:49 PM ? ? ? ?[name] ?05/18/2021, 3:44 PM ? ?

## 2021-05-18 NOTE — Progress Notes (Signed)
?[facility]  ?ROUNDING NOTE  ? ?Subjective:  ? 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   ? ? Patient has been admitted for Hemorrhagic shock ([place]) [R57.8] ?GIB (gastrointestinal bleeding) [K92.2] ?Upper GI bleed [K92.2] ? ? ?Update: ? ?Patient seen sitting up in chair ?Completed breakfast tray at bedside ?States he feels well, able to ambulate in room with assistance.  ? ? ?Objective:  ?Vital signs in last 24 hours:  ?Temp:  [97.6 ?F (36.4 ?C)-98.3 ?F (36.8 ?C)] 98.3 ?F (36.8 ?C) (04/25 6045) ?Pulse Rate:  [51-112] 88 (04/25 0728) ?Resp:  [14-21] 16 (04/25 0728) ?BP: (103-148)/(70-105) 125/85 (04/25 0728) ?SpO2:  [91 %-96 %] 95 % (04/25 0728) ?Weight:  [94.7 kg-95.3 kg] 94.7 kg (04/25 0500) ? ?Weight change: 0.2 kg ?Filed Weights  ? 05/17/21 0958 05/17/21 1302 05/18/21 0500  ?Weight: 96.6 kg 95.3 kg 94.7 kg  ? ? ?Intake/Output: ?I/O last 3 completed shifts: ?In: 289.6 [P.O.:240; I.V.:0.5; IV Piggyback:49.2] ?Out: 2244 [Urine:1240; Other:1004] ?  ?Intake/Output this shift: ? No intake/output data recorded. ? ?Physical Exam: ?General: NAD  ?Head: Normocephalic, atraumatic.    ?Eyes: Anicteric  ?Lungs:  Clear to auscultation, normal effort  ?Heart: Irregular rate and rhythm  ?Abdomen:  Soft, nontender, nondistended  ?Extremities: + peripheral edema.  ?Neurologic: Alert, oriented    ?Skin: Warm, dry  ?Access: Rt permcath   ? ? ?Basic Metabolic Panel: ?Recent Labs  ?Lab 05/14/21 ?0434 05/15/21 ?0113 05/16/21 ?4098 05/17/21 ?1191 05/18/21 ?0602  ?NA 138 136 137 138 136  ?K 3.4* 3.0* 3.2* 3.3* 3.5  ?CL 104 102 107 106 106  ?CO2 24 26 24 23 24   ?GLUCOSE 122* 88 140* 90 102*  ?BUN 112* 61* 77* 89* 59*  ?CREATININE 4.80* 3.59* 4.83* 5.59* 4.20*  ?CALCIUM 7.2* 7.3* 6.8* 6.8* 6.8*  ?MG 2.2 2.0 1.9 1.9 1.8  ?PHOS 5.5*  4.4 5.0* 5.0* 4.7*  ? ? ? ?Liver Function Tests: ?Recent Labs  ?Lab 05/12/21 ?4782 05/17/21 ?1805  ?AST 26  --   ?ALT 35  --   ?ALKPHOS 31*  --   ?BILITOT 1.2  --   ?PROT 3.9*  --   ?ALBUMIN 2.0* 2.1*  ? ? ?No results for input(s): LIPASE, AMYLASE in the last 168 hours. ? ?No results for input(s): AMMONIA in the last 168 hours. ? ?CBC: ?Recent Labs  ?Lab 05/14/21 ?0434 05/14/21 ?9562 05/15/21 ?0113 05/15/21 ?0422 05/15/21 ?1750 05/16/21 ?1308 05/17/21 ?6578 05/17/21 ?1805 05/18/21 ?0602  ?WBC 9.9  --  7.7  --   --  6.0 6.4  --  6.6  ?NEUTROABS  --   --  5.8  --   --  4.0 3.8  --   --   ?HGB 6.8*   < > 9.0*   < > 9.0* 8.3* 8.1* 8.8* 8.2*  ?HCT 20.1*   < > 26.9*   < > 27.0* 25.3* 24.9* 26.3* 24.7*  ?MCV 91.4  --  91.8  --   --  95.5 95.8  --  95.4  ?PLT 106*  --  104*  --   --  120* 130*  --  137*  ? < > = values in this interval not displayed.  ? ? ? ?Cardiac Enzymes: ?No results for input(s): CKTOTAL, CKMB, CKMBINDEX, TROPONINI in the last 168 hours. ? ?BNP: ?Invalid input(s): POCBNP ? ?CBG: ?Recent Labs  ?  Lab 05/15/21 ?6333  ?GLUCAP 71  ? ? ? ?Microbiology: ?Results for orders placed or performed during the hospital encounter of 05/11/21  ?Culture, blood (Routine X 2) w Reflex to ID Panel     Status: None  ? Collection Time: 05/11/21  1:16 PM  ? Specimen: BLOOD  ?Result Value Ref Range Status  ? Specimen Description BLOOD LEFT HAND  Final  ? Special Requests   Final  ?  BOTTLES DRAWN AEROBIC AND ANAEROBIC Blood Culture adequate volume  ? Culture   Final  ?  NO GROWTH 5 DAYS ?Performed at [facility], [address] [place], [place] [address] ?  ? Report Status 05/16/2021 FINAL  Final  ?Culture, blood (Routine X 2) w Reflex to ID Panel     Status: None  ? Collection Time: 05/11/21  1:16 PM  ? Specimen: BLOOD  ?Result Value Ref Range Status  ? Specimen Description BLOOD RIGHT HAND  Final  ? Special Requests   Final  ?  BOTTLES DRAWN AEROBIC ONLY Blood Culture results may not be optimal due to an  inadequate volume of blood received in culture bottles  ? Culture   Final  ?  NO GROWTH 5 DAYS ?Performed at [facility], [address] [place],  [address] ?  ? Report Status 05/16/2021 FINAL  Final  ?MRSA Next Gen by PCR, Nasal     Status: None  ? Collection Time: 05/11/21  7:00 PM  ? Specimen: Nasal Mucosa; Nasal Swab  ?Result Value Ref Range Status  ? MRSA by PCR Next Gen NOT DETECTED NOT DETECTED Final  ?  Comment: (NOTE) ?The GeneXpert MRSA Assay (FDA approved for NASAL specimens only), ?is one component of a comprehensive MRSA colonization surveillance ?program. It is not intended to diagnose MRSA infection nor to guide ?or monitor treatment for MRSA infections. ?Test performance is not FDA approved in patients less than 2 years ?old. ?Performed at [facility], [place], ?[place] [address] ?  ? ? ?Coagulation Studies: ?No results for input(s): LABPROT, INR in the last 72 hours. ? ? ?Urinalysis: ?No results for input(s): COLORURINE, LABSPEC, [place], GLUCOSEU, HGBUR, BILIRUBINUR, KETONESUR, PROTEINUR, UROBILINOGEN, NITRITE, LEUKOCYTESUR in the last 72 hours. ? ?Invalid input(s): APPERANCEUR ?  ? ? ?Imaging: ?No results found. ? ? ?Medications:  ? ? sodium chloride Stopped (05/15/21 2345)  ? sodium chloride Stopped (05/17/21 1413)  ? famotidine (PEPCID) IV    ? sodium chloride    ? ? sodium chloride   Intravenous Once  ? Chlorhexidine Gluconate Cloth  6 each Topical Q0600  ? epoetin (EPOGEN/PROCRIT) injection  4,000 Units Subcutaneous Q M,W,F-HD  ? metoprolol tartrate  25 mg Oral BID  ? pantoprazole  40 mg Oral BID  ? predniSONE  60 mg Oral Q breakfast  ? sodium chloride flush  10-40 mL Intracatheter Q12H  ? sulfamethoxazole-trimethoprim  1 tablet Oral Once per day on Mon Wed Fri  ? ?sodium chloride, albuterol, diphenhydrAMINE, docusate sodium, EPINEPHrine, famotidine (PEPCID) IV, methylPREDNISolone (SOLU-MEDROL) injection, metoprolol tartrate, polyethylene glycol,  sodium chloride, sodium chloride flush ? ?Assessment/ Plan:  ?Blake West is a 71 y.o.  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  Patient presents to the emergency department with complaints of nausea, vomiting, generalized decline.  Patient has been admitted for Hemorrhagic shock ([place]) [R57.8] ?GIB (gastrointestinal bleeding) [K92.2] ?Upper GI bleed [  K92.2] ? ?[place], MWF, right PermCath ? ?Acute Kidney Injury with hyperkalemia on chronic kidney disease stage IV with baseline creatinine 3.0 and GFR of 21 on 02/17/21.  ?AKI multifactorial with contributions from ANCA vasculitis ([place]) (N01.7) and ATN.  ?  Chronic kidney disease is secondary to diabetes, NSAID use and hypertension.  ? ?CT abdomen pelvis negative for obstruction.  No exposure to IV contrast.  Patient received initial dialysis 04/22/21.  ?QuantiFERON-TB gold obtained on 04/22/21 ?Renal biopsy 04/26/21. : ANCA related disease, 38% fibrous-cellular crescents, 50% interstitial fibrosis with moderate to severe scarring and ATN.  ? ?Given rituximab 1 gm on 04/30/2021 ?Was on Prednisone 60 mg daily ?Bactrim DS three times per week ? ?- Rituximab 1 g ordered today ?- Prednisone 60mg  for vasculitis ?- Next dialysis treatment on Wednesday ? ?Lab Results  ?Component Value Date  ? CREATININE 4.20 (H) 05/18/2021  ? CREATININE 5.59 (H) 05/17/2021  ? CREATININE 4.83 (H) 05/16/2021  ? ? ?Intake/Output Summary (Last 24 hours) at 05/18/2021 1101 ?Last data filed at 05/18/2021 0007 ?Gross per 24 hour  ?Intake 49.61 ml  ?Output 1754 ml  ?Net -1704.39 ml  ? ? ? ?2.   Anemia of acute GI blood loss and chronic kidney disease ? EGD showed cratered duodenal ulcer (4/20) ?-Colonoscopy completed 05/15/21 showed diverticulosis and hemorrhoids, no active bleeding. ?--Hemoglobin 8.2.  Low dose EPO ordered with  dialysis ? ?3.[place] Kitchen   Chronic systolic heart failure. And Atrial Fibrrilation ? Echo from 4//5/23 shows EF 40-45% with moderately decreased left ventricular function, global hypokinesis  Home regimen includes meto

## 2021-05-18 NOTE — TOC Progression Note (Signed)
Transition of Care (TOC) - Progression Note  ? ? ?Patient Details  ?Name: BRADSHAW MINIHAN ?MRN: [id number] ?Date of Birth: [birth year] ? ?Transition of Care (TOC) CM/SW Contact  ?Laurena Slimmer, RN ?Phone Number: ?05/18/2021, 2:27 PM ? ?Clinical Narrative:    ?Per Corene Cornea at  [employer] patient is already active for RN/PT/ OT.  ? ? ?Expected Discharge Plan: [place] ?Barriers to Discharge: Continued Medical Work up ? ?Expected Discharge Plan and Services ?Expected Discharge Plan: [place] ?  ?Discharge Planning Services: CM Consult ?  ?Living arrangements for the past 2 months: [place] ?Expected Discharge Date: 05/18/21               ?DME Arranged: N/A ?DME Agency: NA ?  ?  ?  ?HH Arranged: PT, RN, OT ?[place] Agency: [place] ([place]) ?Date HH Agency Contacted: 05/12/21 ?Time [place]: 1218 ?Representative spoke with at [place]: Corene Cornea ? ? ?Social Determinants of Health (SDOH) Interventions ?  ? ?Readmission Risk Interventions ? ?  05/12/2021  ? 12:23 PM 04/22/2021  ? 12:24 PM  ?Readmission Risk Prevention Plan  ?Transportation Screening Complete Complete  ?PCP or Specialist Appt within 3-5 Days  Complete  ?Social Work Consult for [place] Planning/Counseling  Complete  ?Palliative Care Screening  Not Applicable  ?Medication Review Press photographer) Complete Complete  ?PCP or Specialist appointment within 3-5 days of discharge Complete   ?[place] or Home Care Consult Complete   ?SW Recovery Care/Counseling Consult Complete   ?Palliative Care Screening Complete   ?[place] Not Applicable   ? ? ?

## 2021-05-18 NOTE — Progress Notes (Signed)
Physical Therapy Treatment ?Patient Details ?Name: Blake West ?MRN: [id number] ?DOB: January 13, 1951 ?Today's Date: 05/18/2021 ? ? ?History of Present Illness Patient is a 71 y.o male with PMH significant for chronic kidney disease stage IV with glomerulonephritis,  ANCA-associated vasculitis, and HFrEF (LVEF 40-45%), Atrial fibrillation on Eliquis who is  admitted 05/11/2021 with Hemorrhagic Shock in the setting of Acute GI Bleed. ? ?  ?PT Comments  ? ? Pt received seated in recliner upon arrival to room and pt agreeable to therapy.  Pt able to perform STS with RW and good technique.  Pt notes slight dizziness upon standing, but is much better, pt stating his equilibrium is much better than prior sessions.  Pt then able to ambulate around nursing pod B x3 with good technique.  Pt does have significant genu valgus that pt states has been present since he was born.  Pt notes some increased pain in knee when ambulating due to the curvature in his legs, but is able to manage to continue performance.  Current discharge plans to home with HHPT remain appropriate at this time.  Pt will continue to benefit from skilled therapy in order to address deficits listed below. ? ?  ?Recommendations for follow up therapy are one component of a multi-disciplinary discharge planning process, led by the attending physician.  Recommendations may be updated based on patient status, additional functional criteria and insurance authorization. ? ?Follow Up Recommendations ? Home health PT ?  ?  ?Assistance Recommended at Discharge Intermittent Supervision/Assistance  ?Patient can return home with the following A little help with walking and/or transfers;A lot of help with bathing/dressing/bathroom;Help with stairs or ramp for entrance;Assist for transportation ?  ?Equipment Recommendations ? None recommended by PT  ?  ?Recommendations for Other Services   ? ? ?  ?Precautions / Restrictions Precautions ?Precautions: Fall ?Restrictions ?Weight  Bearing Restrictions: No  ?  ? ?Mobility ? Bed Mobility ?  ?  ?  ?  ?  ?  ?  ?General bed mobility comments: pt upright in recliner upon arrival to room and returned there. ?  ? ?Transfers ?Overall transfer level: Needs assistance ?Equipment used: Rolling walker (2 wheels) ?Transfers: Sit to/from Stand, Bed to chair/wheelchair/BSC ?Sit to Stand: Min guard ?  ?Step pivot transfers: Min guard ?  ?  ?  ?General transfer comment: verbal cues for technique for standing for independence and safety. multiple standing bouts performed this session. ?  ? ?Ambulation/Gait ?Ambulation/Gait assistance: Min guard ?Gait Distance (Feet): 300 Feet ?Assistive device: Rolling walker (2 wheels) ?Gait Pattern/deviations: Step-through pattern, Wide base of support ?Gait velocity: decreased ?  ?  ?General Gait Details: Pt able to ambulate around B pod x3 with one standing rest break. ? ? ?Stairs ?  ?  ?  ?  ?  ? ? ?Wheelchair Mobility ?  ? ?Modified Rankin (Stroke Patients Only) ?  ? ? ?  ?Balance Overall balance assessment: Needs assistance ?Sitting-balance support: No upper extremity supported ?Sitting balance-Leahy Scale: Good ?  ?  ?Standing balance support: Bilateral upper extremity supported, During functional activity ?Standing balance-Leahy Scale: Fair ?Standing balance comment: with rolling walker for UE support ?  ?  ?  ?  ?  ?  ?  ?  ?  ?  ?  ?  ? ?  ?Cognition Arousal/Alertness: Awake/alert ?Behavior During Therapy: [facility] for tasks assessed/performed ?Overall Cognitive Status: Within Functional Limits for tasks assessed ?  ?  ?  ?  ?  ?  ?  ?  ?  ?  ?  ?  ?  ?  ?  ?  ?  ?  ?  ? ?  ?  Exercises   ? ?  ?General Comments   ?  ?  ? ?Pertinent Vitals/Pain Pain Assessment ?Pain Assessment: No/denies pain  ? ? ?Home Living   ?  ?  ?  ?  ?  ?  ?  ?  ?  ?   ?  ?Prior Function    ?  ?  ?   ? ?PT Goals (current goals can now be found in the care plan section) Acute Rehab PT Goals ?Patient Stated Goal: to get better ?PT Goal Formulation: With  patient/family ?Time For Goal Achievement: 05/30/21 ?Potential to Achieve Goals: Good ?Progress towards PT goals: Progressing toward goals ? ?  ?Frequency ? ? ? Min 2X/week ? ? ? ?  ?PT Plan Current plan remains appropriate  ? ? ?Co-evaluation   ?  ?  ?  ?  ? ?  ?AM-PAC PT "6 Clicks" Mobility   ?Outcome Measure ? Help needed turning from your back to your side while in a flat bed without using bedrails?: A Little ?Help needed moving from lying on your back to sitting on the side of a flat bed without using bedrails?: A Little ?Help needed moving to and from a bed to a chair (including a wheelchair)?: A Little ?Help needed standing up from a chair using your arms (e.g., wheelchair or bedside chair)?: A Little ?Help needed to walk in hospital room?: A Little ?Help needed climbing 3-5 steps with a railing? : A Lot ?6 Click Score: 17 ? ?  ?End of Session Equipment Utilized During Treatment: Gait belt ?Activity Tolerance: Patient tolerated treatment well ?Patient left: in bed;with call bell/phone within reach;with bed alarm set ?Nurse Communication: Mobility status ?PT Visit Diagnosis: Unsteadiness on feet (R26.81);Muscle weakness (generalized) (M62.81);Difficulty in walking, not elsewhere classified (R26.2) ?  ? ? ?Time: 2536-6440 ?PT Time Calculation (min) (ACUTE ONLY): 13 min ? ?Charges:  $Gait Training: 8-22 mins          ?          ? ?Gwenlyn Saran, PT, DPT ?05/18/21, 1:45 PM ? ? ? ?[name] ?05/18/2021, 1:42 PM ? ?

## 2021-05-18 NOTE — Progress Notes (Signed)
Patient transferred dialysis centers. Patient is now set up at [facility] ([facility] 6am. Wife is aware and patient will start on Thursday 4/27 at 5:45am. ?

## 2021-05-19 DIAGNOSIS — E039 Hypothyroidism, unspecified: Secondary | ICD-10-CM | POA: Diagnosis not present

## 2021-05-19 DIAGNOSIS — Z955 Presence of coronary angioplasty implant and graft: Secondary | ICD-10-CM | POA: Diagnosis not present

## 2021-05-19 DIAGNOSIS — J189 Pneumonia, unspecified organism: Secondary | ICD-10-CM | POA: Diagnosis not present

## 2021-05-19 DIAGNOSIS — E440 Moderate protein-calorie malnutrition: Secondary | ICD-10-CM | POA: Diagnosis not present

## 2021-05-19 DIAGNOSIS — I132 Hypertensive heart and chronic kidney disease with heart failure and with stage 5 chronic kidney disease, or end stage renal disease: Secondary | ICD-10-CM | POA: Diagnosis not present

## 2021-05-19 DIAGNOSIS — M109 Gout, unspecified: Secondary | ICD-10-CM | POA: Diagnosis not present

## 2021-05-19 DIAGNOSIS — N39498 Other specified urinary incontinence: Secondary | ICD-10-CM | POA: Diagnosis not present

## 2021-05-19 DIAGNOSIS — R739 Hyperglycemia, unspecified: Secondary | ICD-10-CM | POA: Diagnosis not present

## 2021-05-19 DIAGNOSIS — Z7952 Long term (current) use of systemic steroids: Secondary | ICD-10-CM | POA: Diagnosis not present

## 2021-05-19 DIAGNOSIS — J9601 Acute respiratory failure with hypoxia: Secondary | ICD-10-CM | POA: Diagnosis not present

## 2021-05-19 DIAGNOSIS — I2510 Atherosclerotic heart disease of native coronary artery without angina pectoris: Secondary | ICD-10-CM | POA: Diagnosis not present

## 2021-05-19 DIAGNOSIS — I7782 Antineutrophilic cytoplasmic antibody (ANCA) vasculitis: Secondary | ICD-10-CM | POA: Diagnosis not present

## 2021-05-19 DIAGNOSIS — K5730 Diverticulosis of large intestine without perforation or abscess without bleeding: Secondary | ICD-10-CM | POA: Diagnosis not present

## 2021-05-19 DIAGNOSIS — Z7901 Long term (current) use of anticoagulants: Secondary | ICD-10-CM | POA: Diagnosis not present

## 2021-05-19 DIAGNOSIS — I700 Atherosclerosis of aorta: Secondary | ICD-10-CM | POA: Diagnosis not present

## 2021-05-19 DIAGNOSIS — I5022 Chronic systolic (congestive) heart failure: Secondary | ICD-10-CM | POA: Diagnosis not present

## 2021-05-19 DIAGNOSIS — N401 Enlarged prostate with lower urinary tract symptoms: Secondary | ICD-10-CM | POA: Diagnosis not present

## 2021-05-19 DIAGNOSIS — Z992 Dependence on renal dialysis: Secondary | ICD-10-CM | POA: Diagnosis not present

## 2021-05-19 DIAGNOSIS — I482 Chronic atrial fibrillation, unspecified: Secondary | ICD-10-CM | POA: Diagnosis not present

## 2021-05-19 DIAGNOSIS — N186 End stage renal disease: Secondary | ICD-10-CM | POA: Diagnosis not present

## 2021-05-21 DIAGNOSIS — N186 End stage renal disease: Secondary | ICD-10-CM | POA: Diagnosis not present

## 2021-05-21 DIAGNOSIS — N059 Unspecified nephritic syndrome with unspecified morphologic changes: Secondary | ICD-10-CM | POA: Diagnosis not present

## 2021-05-21 DIAGNOSIS — E039 Hypothyroidism, unspecified: Secondary | ICD-10-CM | POA: Diagnosis not present

## 2021-05-21 DIAGNOSIS — Z992 Dependence on renal dialysis: Secondary | ICD-10-CM | POA: Diagnosis not present

## 2021-05-21 DIAGNOSIS — Z09 Encounter for follow-up examination after completed treatment for conditions other than malignant neoplasm: Secondary | ICD-10-CM | POA: Diagnosis not present

## 2021-05-21 DIAGNOSIS — I4891 Unspecified atrial fibrillation: Secondary | ICD-10-CM | POA: Diagnosis not present

## 2021-05-21 DIAGNOSIS — I7782 Antineutrophilic cytoplasmic antibody (ANCA) vasculitis: Secondary | ICD-10-CM | POA: Diagnosis not present

## 2021-05-21 DIAGNOSIS — E119 Type 2 diabetes mellitus without complications: Secondary | ICD-10-CM | POA: Diagnosis not present

## 2021-05-23 DIAGNOSIS — Z992 Dependence on renal dialysis: Secondary | ICD-10-CM | POA: Diagnosis not present

## 2021-05-23 DIAGNOSIS — Z7952 Long term (current) use of systemic steroids: Secondary | ICD-10-CM | POA: Diagnosis not present

## 2021-05-23 DIAGNOSIS — M109 Gout, unspecified: Secondary | ICD-10-CM | POA: Diagnosis not present

## 2021-05-23 DIAGNOSIS — Z7901 Long term (current) use of anticoagulants: Secondary | ICD-10-CM | POA: Diagnosis not present

## 2021-05-23 DIAGNOSIS — I482 Chronic atrial fibrillation, unspecified: Secondary | ICD-10-CM | POA: Diagnosis not present

## 2021-05-23 DIAGNOSIS — N186 End stage renal disease: Secondary | ICD-10-CM | POA: Diagnosis not present

## 2021-05-23 DIAGNOSIS — I132 Hypertensive heart and chronic kidney disease with heart failure and with stage 5 chronic kidney disease, or end stage renal disease: Secondary | ICD-10-CM | POA: Diagnosis not present

## 2021-05-23 DIAGNOSIS — I5022 Chronic systolic (congestive) heart failure: Secondary | ICD-10-CM | POA: Diagnosis not present

## 2021-05-23 DIAGNOSIS — E440 Moderate protein-calorie malnutrition: Secondary | ICD-10-CM | POA: Diagnosis not present

## 2021-05-23 DIAGNOSIS — J189 Pneumonia, unspecified organism: Secondary | ICD-10-CM | POA: Diagnosis not present

## 2021-05-23 DIAGNOSIS — I700 Atherosclerosis of aorta: Secondary | ICD-10-CM | POA: Diagnosis not present

## 2021-05-23 DIAGNOSIS — R739 Hyperglycemia, unspecified: Secondary | ICD-10-CM | POA: Diagnosis not present

## 2021-05-23 DIAGNOSIS — I7782 Antineutrophilic cytoplasmic antibody (ANCA) vasculitis: Secondary | ICD-10-CM | POA: Diagnosis not present

## 2021-05-23 DIAGNOSIS — Z955 Presence of coronary angioplasty implant and graft: Secondary | ICD-10-CM | POA: Diagnosis not present

## 2021-05-23 DIAGNOSIS — N401 Enlarged prostate with lower urinary tract symptoms: Secondary | ICD-10-CM | POA: Diagnosis not present

## 2021-05-23 DIAGNOSIS — J9601 Acute respiratory failure with hypoxia: Secondary | ICD-10-CM | POA: Diagnosis not present

## 2021-05-23 DIAGNOSIS — E039 Hypothyroidism, unspecified: Secondary | ICD-10-CM | POA: Diagnosis not present

## 2021-05-23 DIAGNOSIS — N39498 Other specified urinary incontinence: Secondary | ICD-10-CM | POA: Diagnosis not present

## 2021-05-23 DIAGNOSIS — I2510 Atherosclerotic heart disease of native coronary artery without angina pectoris: Secondary | ICD-10-CM | POA: Diagnosis not present

## 2021-05-23 DIAGNOSIS — K5730 Diverticulosis of large intestine without perforation or abscess without bleeding: Secondary | ICD-10-CM | POA: Diagnosis not present

## 2021-05-24 DIAGNOSIS — K5730 Diverticulosis of large intestine without perforation or abscess without bleeding: Secondary | ICD-10-CM | POA: Diagnosis not present

## 2021-05-24 DIAGNOSIS — I2510 Atherosclerotic heart disease of native coronary artery without angina pectoris: Secondary | ICD-10-CM | POA: Diagnosis not present

## 2021-05-24 DIAGNOSIS — E440 Moderate protein-calorie malnutrition: Secondary | ICD-10-CM | POA: Diagnosis not present

## 2021-05-24 DIAGNOSIS — N39498 Other specified urinary incontinence: Secondary | ICD-10-CM | POA: Diagnosis not present

## 2021-05-24 DIAGNOSIS — N186 End stage renal disease: Secondary | ICD-10-CM | POA: Diagnosis not present

## 2021-05-24 DIAGNOSIS — I5022 Chronic systolic (congestive) heart failure: Secondary | ICD-10-CM | POA: Diagnosis not present

## 2021-05-24 DIAGNOSIS — Z992 Dependence on renal dialysis: Secondary | ICD-10-CM | POA: Diagnosis not present

## 2021-05-24 DIAGNOSIS — J9601 Acute respiratory failure with hypoxia: Secondary | ICD-10-CM | POA: Diagnosis not present

## 2021-05-24 DIAGNOSIS — I132 Hypertensive heart and chronic kidney disease with heart failure and with stage 5 chronic kidney disease, or end stage renal disease: Secondary | ICD-10-CM | POA: Diagnosis not present

## 2021-05-24 DIAGNOSIS — I700 Atherosclerosis of aorta: Secondary | ICD-10-CM | POA: Diagnosis not present

## 2021-05-24 DIAGNOSIS — E039 Hypothyroidism, unspecified: Secondary | ICD-10-CM | POA: Diagnosis not present

## 2021-05-24 DIAGNOSIS — R739 Hyperglycemia, unspecified: Secondary | ICD-10-CM | POA: Diagnosis not present

## 2021-05-24 DIAGNOSIS — Z7952 Long term (current) use of systemic steroids: Secondary | ICD-10-CM | POA: Diagnosis not present

## 2021-05-24 DIAGNOSIS — I482 Chronic atrial fibrillation, unspecified: Secondary | ICD-10-CM | POA: Diagnosis not present

## 2021-05-24 DIAGNOSIS — J189 Pneumonia, unspecified organism: Secondary | ICD-10-CM | POA: Diagnosis not present

## 2021-05-24 DIAGNOSIS — Z955 Presence of coronary angioplasty implant and graft: Secondary | ICD-10-CM | POA: Diagnosis not present

## 2021-05-24 DIAGNOSIS — I7782 Antineutrophilic cytoplasmic antibody (ANCA) vasculitis: Secondary | ICD-10-CM | POA: Diagnosis not present

## 2021-05-24 DIAGNOSIS — Z7901 Long term (current) use of anticoagulants: Secondary | ICD-10-CM | POA: Diagnosis not present

## 2021-05-24 DIAGNOSIS — N401 Enlarged prostate with lower urinary tract symptoms: Secondary | ICD-10-CM | POA: Diagnosis not present

## 2021-05-24 DIAGNOSIS — M109 Gout, unspecified: Secondary | ICD-10-CM | POA: Diagnosis not present

## 2021-05-25 DIAGNOSIS — E8779 Other fluid overload: Secondary | ICD-10-CM | POA: Diagnosis not present

## 2021-05-25 DIAGNOSIS — D509 Iron deficiency anemia, unspecified: Secondary | ICD-10-CM | POA: Diagnosis not present

## 2021-05-25 DIAGNOSIS — N179 Acute kidney failure, unspecified: Secondary | ICD-10-CM | POA: Diagnosis not present

## 2021-05-25 DIAGNOSIS — N184 Chronic kidney disease, stage 4 (severe): Secondary | ICD-10-CM | POA: Diagnosis not present

## 2021-05-26 DIAGNOSIS — E039 Hypothyroidism, unspecified: Secondary | ICD-10-CM | POA: Diagnosis not present

## 2021-05-26 DIAGNOSIS — N39498 Other specified urinary incontinence: Secondary | ICD-10-CM | POA: Diagnosis not present

## 2021-05-26 DIAGNOSIS — Z955 Presence of coronary angioplasty implant and graft: Secondary | ICD-10-CM | POA: Diagnosis not present

## 2021-05-26 DIAGNOSIS — Z992 Dependence on renal dialysis: Secondary | ICD-10-CM | POA: Diagnosis not present

## 2021-05-26 DIAGNOSIS — I132 Hypertensive heart and chronic kidney disease with heart failure and with stage 5 chronic kidney disease, or end stage renal disease: Secondary | ICD-10-CM | POA: Diagnosis not present

## 2021-05-26 DIAGNOSIS — J9601 Acute respiratory failure with hypoxia: Secondary | ICD-10-CM | POA: Diagnosis not present

## 2021-05-26 DIAGNOSIS — I700 Atherosclerosis of aorta: Secondary | ICD-10-CM | POA: Diagnosis not present

## 2021-05-26 DIAGNOSIS — N401 Enlarged prostate with lower urinary tract symptoms: Secondary | ICD-10-CM | POA: Diagnosis not present

## 2021-05-26 DIAGNOSIS — J189 Pneumonia, unspecified organism: Secondary | ICD-10-CM | POA: Diagnosis not present

## 2021-05-26 DIAGNOSIS — I2510 Atherosclerotic heart disease of native coronary artery without angina pectoris: Secondary | ICD-10-CM | POA: Diagnosis not present

## 2021-05-26 DIAGNOSIS — M109 Gout, unspecified: Secondary | ICD-10-CM | POA: Diagnosis not present

## 2021-05-26 DIAGNOSIS — E440 Moderate protein-calorie malnutrition: Secondary | ICD-10-CM | POA: Diagnosis not present

## 2021-05-26 DIAGNOSIS — Z7901 Long term (current) use of anticoagulants: Secondary | ICD-10-CM | POA: Diagnosis not present

## 2021-05-26 DIAGNOSIS — N186 End stage renal disease: Secondary | ICD-10-CM | POA: Diagnosis not present

## 2021-05-26 DIAGNOSIS — I5022 Chronic systolic (congestive) heart failure: Secondary | ICD-10-CM | POA: Diagnosis not present

## 2021-05-26 DIAGNOSIS — I7782 Antineutrophilic cytoplasmic antibody (ANCA) vasculitis: Secondary | ICD-10-CM | POA: Diagnosis not present

## 2021-05-26 DIAGNOSIS — K5730 Diverticulosis of large intestine without perforation or abscess without bleeding: Secondary | ICD-10-CM | POA: Diagnosis not present

## 2021-05-26 DIAGNOSIS — I482 Chronic atrial fibrillation, unspecified: Secondary | ICD-10-CM | POA: Diagnosis not present

## 2021-05-26 DIAGNOSIS — Z7952 Long term (current) use of systemic steroids: Secondary | ICD-10-CM | POA: Diagnosis not present

## 2021-05-26 DIAGNOSIS — R739 Hyperglycemia, unspecified: Secondary | ICD-10-CM | POA: Diagnosis not present

## 2021-05-30 ENCOUNTER — Encounter: Payer: Self-pay | Admitting: Internal Medicine

## 2021-05-31 DIAGNOSIS — E440 Moderate protein-calorie malnutrition: Secondary | ICD-10-CM | POA: Diagnosis not present

## 2021-05-31 DIAGNOSIS — Z955 Presence of coronary angioplasty implant and graft: Secondary | ICD-10-CM | POA: Diagnosis not present

## 2021-05-31 DIAGNOSIS — Z7952 Long term (current) use of systemic steroids: Secondary | ICD-10-CM | POA: Diagnosis not present

## 2021-05-31 DIAGNOSIS — I7782 Antineutrophilic cytoplasmic antibody (ANCA) vasculitis: Secondary | ICD-10-CM | POA: Diagnosis not present

## 2021-05-31 DIAGNOSIS — I700 Atherosclerosis of aorta: Secondary | ICD-10-CM | POA: Diagnosis not present

## 2021-05-31 DIAGNOSIS — I482 Chronic atrial fibrillation, unspecified: Secondary | ICD-10-CM | POA: Diagnosis not present

## 2021-05-31 DIAGNOSIS — I2510 Atherosclerotic heart disease of native coronary artery without angina pectoris: Secondary | ICD-10-CM | POA: Diagnosis not present

## 2021-05-31 DIAGNOSIS — Z992 Dependence on renal dialysis: Secondary | ICD-10-CM | POA: Diagnosis not present

## 2021-05-31 DIAGNOSIS — K5730 Diverticulosis of large intestine without perforation or abscess without bleeding: Secondary | ICD-10-CM | POA: Diagnosis not present

## 2021-05-31 DIAGNOSIS — J189 Pneumonia, unspecified organism: Secondary | ICD-10-CM | POA: Diagnosis not present

## 2021-05-31 DIAGNOSIS — N186 End stage renal disease: Secondary | ICD-10-CM | POA: Diagnosis not present

## 2021-05-31 DIAGNOSIS — I5022 Chronic systolic (congestive) heart failure: Secondary | ICD-10-CM | POA: Diagnosis not present

## 2021-05-31 DIAGNOSIS — N401 Enlarged prostate with lower urinary tract symptoms: Secondary | ICD-10-CM | POA: Diagnosis not present

## 2021-05-31 DIAGNOSIS — M109 Gout, unspecified: Secondary | ICD-10-CM | POA: Diagnosis not present

## 2021-05-31 DIAGNOSIS — J9601 Acute respiratory failure with hypoxia: Secondary | ICD-10-CM | POA: Diagnosis not present

## 2021-05-31 DIAGNOSIS — E039 Hypothyroidism, unspecified: Secondary | ICD-10-CM | POA: Diagnosis not present

## 2021-05-31 DIAGNOSIS — R739 Hyperglycemia, unspecified: Secondary | ICD-10-CM | POA: Diagnosis not present

## 2021-05-31 DIAGNOSIS — Z7901 Long term (current) use of anticoagulants: Secondary | ICD-10-CM | POA: Diagnosis not present

## 2021-05-31 DIAGNOSIS — N39498 Other specified urinary incontinence: Secondary | ICD-10-CM | POA: Diagnosis not present

## 2021-05-31 DIAGNOSIS — I132 Hypertensive heart and chronic kidney disease with heart failure and with stage 5 chronic kidney disease, or end stage renal disease: Secondary | ICD-10-CM | POA: Diagnosis not present

## 2021-06-04 ENCOUNTER — Emergency Department: Payer: PPO

## 2021-06-04 ENCOUNTER — Emergency Department
Admission: EM | Admit: 2021-06-04 | Discharge: 2021-06-04 | Disposition: A | Discharge status: Home / Self Care | Payer: PPO | Attending: Emergency Medicine | Admitting: Emergency Medicine

## 2021-06-04 ENCOUNTER — Other Ambulatory Visit: Payer: Self-pay

## 2021-06-04 DIAGNOSIS — T8242XA Displacement of vascular dialysis catheter, initial encounter: Secondary | ICD-10-CM | POA: Insufficient documentation

## 2021-06-04 DIAGNOSIS — T8249XA Other complication of vascular dialysis catheter, initial encounter: Secondary | ICD-10-CM | POA: Diagnosis not present

## 2021-06-04 DIAGNOSIS — N186 End stage renal disease: Secondary | ICD-10-CM | POA: Diagnosis not present

## 2021-06-04 DIAGNOSIS — Z992 Dependence on renal dialysis: Secondary | ICD-10-CM | POA: Insufficient documentation

## 2021-06-04 DIAGNOSIS — Y732 Prosthetic and other implants, materials and accessory gastroenterology and urology devices associated with adverse incidents: Secondary | ICD-10-CM | POA: Insufficient documentation

## 2021-06-04 DIAGNOSIS — I482 Chronic atrial fibrillation, unspecified: Secondary | ICD-10-CM | POA: Diagnosis not present

## 2021-06-04 DIAGNOSIS — Z452 Encounter for adjustment and management of vascular access device: Secondary | ICD-10-CM | POA: Diagnosis not present

## 2021-06-04 DIAGNOSIS — I120 Hypertensive chronic kidney disease with stage 5 chronic kidney disease or end stage renal disease: Secondary | ICD-10-CM | POA: Diagnosis not present

## 2021-06-04 DIAGNOSIS — I4891 Unspecified atrial fibrillation: Secondary | ICD-10-CM | POA: Diagnosis not present

## 2021-06-04 DIAGNOSIS — R918 Other nonspecific abnormal finding of lung field: Secondary | ICD-10-CM | POA: Diagnosis not present

## 2021-06-04 LAB — CBC WITH DIFFERENTIAL/PLATELET
Abs Immature Granulocytes: 0.72 10*3/uL — ABNORMAL HIGH (ref 0.00–0.07)
Basophils Absolute: 0 10*3/uL (ref 0.0–0.1)
Basophils Relative: 0 %
Eosinophils Absolute: 0 10*3/uL (ref 0.0–0.5)
Eosinophils Relative: 0 %
HCT: 26.8 % — ABNORMAL LOW (ref 39.0–52.0)
Hemoglobin: 8.6 g/dL — ABNORMAL LOW (ref 13.0–17.0)
Immature Granulocytes: 4 %
Lymphocytes Relative: 13 %
Lymphs Abs: 2.1 10*3/uL (ref 0.7–4.0)
MCH: 31.9 pg (ref 26.0–34.0)
MCHC: 32.1 g/dL (ref 30.0–36.0)
MCV: 99.3 fL (ref 80.0–100.0)
Monocytes Absolute: 0.4 10*3/uL (ref 0.1–1.0)
Monocytes Relative: 2 %
Neutro Abs: 13.1 10*3/uL — ABNORMAL HIGH (ref 1.7–7.7)
Neutrophils Relative %: 81 %
Platelets: 179 10*3/uL (ref 150–400)
RBC: 2.7 MIL/uL — ABNORMAL LOW (ref 4.22–5.81)
RDW: 17.3 % — ABNORMAL HIGH (ref 11.5–15.5)
WBC: 16.4 10*3/uL — ABNORMAL HIGH (ref 4.0–10.5)
nRBC: 0.2 % (ref 0.0–0.2)

## 2021-06-04 LAB — BASIC METABOLIC PANEL
Anion gap: 14 (ref 5–15)
BUN: 83 mg/dL — ABNORMAL HIGH (ref 8–23)
CO2: 21 mmol/L — ABNORMAL LOW (ref 22–32)
Calcium: 7.3 mg/dL — ABNORMAL LOW (ref 8.9–10.3)
Chloride: 103 mmol/L (ref 98–111)
Creatinine, Ser: 4.75 mg/dL — ABNORMAL HIGH (ref 0.61–1.24)
GFR, Estimated: 12 mL/min — ABNORMAL LOW (ref >60)
Glucose, Bld: 149 mg/dL — ABNORMAL HIGH (ref 70–99)
Potassium: 4.6 mmol/L (ref 3.5–5.1)
Sodium: 138 mmol/L (ref 135–145)

## 2021-06-04 LAB — PROTIME-INR
INR: 1 (ref 0.8–1.2)
Prothrombin Time: 13.4 seconds (ref 11.4–15.2)

## 2021-06-04 LAB — APTT: aPTT: 26 seconds (ref 24–36)

## 2021-06-04 MED ORDER — METOPROLOL TARTRATE 50 MG PO TABS
50.0000 mg | ORAL_TABLET | Freq: Once | ORAL | Status: AC
Start: 2021-06-04 — End: 2021-06-04
  Administered 2021-06-04: 50 mg via ORAL
  Filled 2021-06-04: qty 1

## 2021-06-04 NOTE — ED Provider Notes (Signed)
? ?[facility] ?Provider Note ? ? ? Event Date/Time  ? First MD Initiated Contact with Patient 06/04/21 1736   ?  (approximate) ? ? ?History  ? ?Vascular Access Problem ? ? ?HPI ? ?Blake West is a 71 y.o. male with a history of incisional disease and chronic atrial fibrillation who gets dialysis Tuesday Thursday Saturday through a right IJ tunneled dialysis catheter who comes ED complaining of displacement of his dialysis catheter today.  He last had dialysis yesterday.  Today while getting out of bed his catheter seem to get caught on the pillow, and later on it was inadvertently completely removed.  He denies any chest pain or shortness of breath or other acute symptoms currently. ?  ? ? ?Physical Exam  ? ?Triage Vital Signs: ?ED Triage Vitals [06/04/21 1417]  ?Enc Vitals Group  ?   BP (!) 130/95  ?   Pulse Rate (!) 117  ?   Resp 18  ?   Temp 98.3 ?F (36.8 ?C)  ?   Temp Source Oral  ?   SpO2 97 %  ?   Weight   ?   Height   ?   Head Circumference   ?   Peak Flow   ?   Pain Score 0  ?   Pain Loc   ?   Pain Edu?   ?   Excl. in [place]?   ? ? ?Most recent vital signs: ?Vitals:  ? 06/04/21 1930 06/04/21 1950  ?BP: (!) 128/94   ?Pulse: (!) 133 (!) 124  ?Resp: 15 16  ?Temp:    ?SpO2: 97% 97%  ? ? ? ?General: Awake, no distress.  ?CV:  Good peripheral perfusion.  Irregular rhythm, tachycardia heart rate 130 ?Resp:  Normal effort.  Clear to auscultation bilaterally ?Abd:  No distention.  Soft and nontender ?Other:  Neck soft and nontender.  No fullness or mass in the area of the tunneled catheter.  No inflammatory changes.  Skin penetration sites are hemostatic, no drainage. ? ? ?ED Results / Procedures / Treatments  ? ?Labs ?(all labs ordered are listed, but only abnormal results are displayed) ?Labs Reviewed  ?BASIC METABOLIC PANEL - Abnormal; Notable for the following components:  ?    Result Value  ? CO2 21 (*)   ? Glucose, Bld 149 (*)   ? BUN 83 (*)   ? Creatinine, Ser 4.75 (*)   ? Calcium 7.3 (*)    ? GFR, Estimated 12 (*)   ? All other components within normal limits  ?CBC WITH DIFFERENTIAL/PLATELET - Abnormal; Notable for the following components:  ? WBC 16.4 (*)   ? RBC 2.70 (*)   ? Hemoglobin 8.6 (*)   ? HCT 26.8 (*)   ? RDW 17.3 (*)   ? Neutro Abs 13.1 (*)   ? Abs Immature Granulocytes 0.72 (*)   ? All other components within normal limits  ?PROTIME-INR  ?APTT  ? ? ? ?EKG ? ?Interpreted by me ?Atrial fibrillation, rate of 133.  Normal axis and intervals.  Poor R wave progression.  Normal ST segments and T waves. ? ? ?RADIOLOGY ?Chest x-ray viewed and interpreted by me, appears normal except for small amount of haziness at the left base.  Radiology report reviewed. ? ? ? ?PROCEDURES: ? ?Critical Care performed: No ? ?Procedures ? ? ?MEDICATIONS ORDERED IN ED: ?Medications  ?metoprolol tartrate (LOPRESSOR) tablet 50 mg (50 mg Oral Given 06/04/21 1915)  ? ? ? ?  IMPRESSION / MDM / ASSESSMENT AND PLAN / ED COURSE  ?I reviewed the triage vital signs and the nursing notes. ?             ?               ? ?Differential diagnosis includes, but is not limited to, displaced dialysis catheter, pulmonary edema, pleural effusion, electrolyte abnormality ? ?**The patient is on the cardiac monitor to evaluate for evidence of arrhythmia and/or significant heart rate changes.**} ? ?Patient presents with displacement of his dialysis catheter.  He has no acute symptoms, and I have low suspicion for acute complication such as volume overload, pericardial effusion.  He has no confusion to suggest symptomatic azotemia.  He is in chronic atrial fibrillation and reports that his heart rate is always elevated around 130. ? ?He was given additional 50 mg oral metoprolol in the ED with improvement of his tachycardia.  Labs are at baseline.  No acute anemia or hyperkalemia. ? ?Situation discussed with vascular surgery Dr. Delana Meyer who will arrange for patient to be scheduled for a new dialysis catheter procedure insertion to be  performed in 3 days on Monday.  Patient can be discharged home over the weekend with return precautions, n.p.o. after midnight on Sunday to return to hospital. ? ?  ? ? ?FINAL CLINICAL IMPRESSION(S) / ED DIAGNOSES  ? ?Final diagnoses:  ?Displacement of vascular dialysis catheter, initial encounter ([place])  ?ESRD on hemodialysis ([place])  ?Chronic atrial fibrillation (HCC)  ? ? ? ?Rx / DC Orders  ? ?ED Discharge Orders   ? ? None  ? ?  ? ? ? ?Note:  This document was prepared using Dragon voice recognition software and may include unintentional dictation errors. ?  ?Carrie Mew, MD ?06/04/21 2016 ? ?

## 2021-06-04 NOTE — ED Notes (Signed)
Pt states that around 12 pm his dialysis port came out. Pt states he goes to dialysis Tuesday, Thursday, and Saturday. Pt states he went to dialysis yesterday. No signs of bleeding where dialysis port came out at.  ?

## 2021-06-04 NOTE — ED Notes (Signed)
Pt verbalized understanding of discharge instructions and follow-up care instructions. Pt advised if symptoms worsen to return to ED.  

## 2021-06-04 NOTE — ED Notes (Signed)
[place]ord MD aware of pt's HR.  ?

## 2021-06-04 NOTE — ED Notes (Signed)
Pt given sandwich tray and drink. 

## 2021-06-04 NOTE — Discharge Instructions (Addendum)
Your lab tests today are okay.  We discussed the situation with vascular surgery, and they will schedule a procedure to insert a new catheter on Monday afternoon.  They will call you Monday morning with instructions on when and where to return to the hospital for the procedure. ? ?Do not eat or drink anything after midnight Sunday night to be ready for the procedure. ? ?If you develop confusion, profound weakness, passing out, or trouble breathing over the weekend, please return to the emergency department. ?

## 2021-06-04 NOTE — ED Triage Notes (Signed)
Pt reports as he was laying in this morning his vas cath fell out, pt states that his last dialysis was yesterday. Pt is to see dr Candiss Norse. ?

## 2021-06-05 DIAGNOSIS — R338 Other retention of urine: Secondary | ICD-10-CM | POA: Diagnosis not present

## 2021-06-05 DIAGNOSIS — R410 Disorientation, unspecified: Secondary | ICD-10-CM | POA: Diagnosis not present

## 2021-06-07 ENCOUNTER — Encounter: Payer: Self-pay | Admitting: Vascular Surgery

## 2021-06-07 ENCOUNTER — Other Ambulatory Visit (INDEPENDENT_AMBULATORY_CARE_PROVIDER_SITE_OTHER): Payer: Self-pay | Admitting: Vascular Surgery

## 2021-06-07 ENCOUNTER — Encounter
Admission: RE | Disposition: A | Discharge status: Home / Self Care | Payer: Self-pay | Source: Ambulatory Visit | Attending: Vascular Surgery

## 2021-06-07 ENCOUNTER — Ambulatory Visit
Admission: RE | Admit: 2021-06-07 | Discharge: 2021-06-07 | Disposition: A | Discharge status: Home / Self Care | Payer: PPO | Source: Ambulatory Visit | Attending: Vascular Surgery | Admitting: Vascular Surgery

## 2021-06-07 DIAGNOSIS — N186 End stage renal disease: Secondary | ICD-10-CM

## 2021-06-07 DIAGNOSIS — Z992 Dependence on renal dialysis: Secondary | ICD-10-CM | POA: Diagnosis not present

## 2021-06-07 HISTORY — PX: DIALYSIS/PERMA CATHETER INSERTION: CATH118288

## 2021-06-07 LAB — POTASSIUM (ARMC VASCULAR LAB ONLY): Potassium (ARMC vascular lab): 4.5 mmol/L (ref 3.5–5.1)

## 2021-06-07 SURGERY — DIALYSIS/PERMA CATHETER INSERTION
Anesthesia: Moderate Sedation

## 2021-06-07 MED ORDER — METHYLPREDNISOLONE SODIUM SUCC 125 MG IJ SOLR: 125.0000 mg | Freq: Once | INTRAMUSCULAR | Status: DC | PRN

## 2021-06-07 MED ORDER — MIDAZOLAM HCL 2 MG/ML PO SYRP
8.0000 mg | ORAL_SOLUTION | Freq: Once | ORAL | Status: DC | PRN
Start: 2021-06-07 — End: 2021-06-07

## 2021-06-07 MED ORDER — CEFAZOLIN SODIUM-DEXTROSE 1-4 GM/50ML-% IV SOLN: 1.0000 g | INTRAVENOUS | Status: DC

## 2021-06-07 MED ORDER — HYDROMORPHONE HCL 1 MG/ML IJ SOLN: 1.0000 mg | Freq: Once | INTRAMUSCULAR | Status: DC | PRN

## 2021-06-07 MED ORDER — FENTANYL CITRATE PF 50 MCG/ML IJ SOSY
PREFILLED_SYRINGE | INTRAMUSCULAR | Status: AC
  Filled 2021-06-07: qty 2

## 2021-06-07 MED ORDER — FAMOTIDINE 20 MG PO TABS: 40.0000 mg | ORAL_TABLET | Freq: Once | ORAL | Status: DC | PRN

## 2021-06-07 MED ORDER — ONDANSETRON HCL 4 MG/2ML IJ SOLN: 4.0000 mg | Freq: Four times a day (QID) | INTRAMUSCULAR | Status: DC | PRN

## 2021-06-07 MED ORDER — HEPARIN SODIUM (PORCINE) 10000 UNIT/ML IJ SOLN
INTRAMUSCULAR | Status: AC
  Filled 2021-06-07: qty 1

## 2021-06-07 MED ORDER — MIDAZOLAM HCL 5 MG/5ML IJ SOLN
INTRAMUSCULAR | Status: AC
  Filled 2021-06-07: qty 5

## 2021-06-07 MED ORDER — CEFAZOLIN SODIUM-DEXTROSE 1-4 GM/50ML-% IV SOLN
INTRAVENOUS | Status: AC
  Filled 2021-06-07: qty 50

## 2021-06-07 MED ORDER — CEFAZOLIN SODIUM-DEXTROSE 1-4 GM/50ML-% IV SOLN
INTRAVENOUS | Status: DC | PRN
  Administered 2021-06-07: 1 g via INTRAVENOUS

## 2021-06-07 MED ORDER — DEXTROSE 5 % IV SOLN: 1.0000 g | Freq: Once | INTRAVENOUS | Status: DC

## 2021-06-07 MED ORDER — FENTANYL CITRATE (PF) 100 MCG/2ML IJ SOLN
INTRAMUSCULAR | Status: DC | PRN
Start: 2021-06-07 — End: 2021-06-07
  Administered 2021-06-07: 50 ug via INTRAVENOUS

## 2021-06-07 MED ORDER — SODIUM CHLORIDE 0.9 % IV SOLN: INTRAVENOUS | Status: DC

## 2021-06-07 MED ORDER — MIDAZOLAM HCL 2 MG/2ML IJ SOLN
INTRAMUSCULAR | Status: DC | PRN
  Administered 2021-06-07: 1 mg via INTRAVENOUS

## 2021-06-07 MED ORDER — DIPHENHYDRAMINE HCL 50 MG/ML IJ SOLN: 50.0000 mg | Freq: Once | INTRAMUSCULAR | Status: DC | PRN

## 2021-06-07 SURGICAL SUPPLY — 7 items
CATH CANNON HEMO 15FR 19 (HEMODIALYSIS SUPPLIES) ×1 IMPLANT
COVER PROBE U/S 5X48 (MISCELLANEOUS) ×1 IMPLANT
DERMABOND ADVANCED (GAUZE/BANDAGES/DRESSINGS) ×1
DERMABOND ADVANCED .7 DNX12 (GAUZE/BANDAGES/DRESSINGS) IMPLANT
PACK ANGIOGRAPHY (CUSTOM PROCEDURE TRAY) ×1 IMPLANT
SUT MNCRL AB 4-0 PS2 18 (SUTURE) ×1 IMPLANT
SUT PROLENE 0 CT 1 30 (SUTURE) ×1 IMPLANT

## 2021-06-07 NOTE — Op Note (Signed)
OPERATIVE NOTE ? ? ? ?PRE-OPERATIVE DIAGNOSIS: 1. ESRD ? ? ?POST-OPERATIVE DIAGNOSIS: same as above ? ?PROCEDURE: ?Ultrasound guidance for vascular access to the right internal jugular vein ?Fluoroscopic guidance for placement of catheter ?Placement of a 19 cm tip to cuff tunneled hemodialysis catheter via the right internal jugular vein ? ?SURGEON: [name], MD ? ?ANESTHESIA:  Local with Moderate conscious sedation for approximately 17 minutes using 1 mg of Versed and 50 mcg of Fentanyl ? ?ESTIMATED BLOOD LOSS: 5 cc ? ?FLUORO TIME: less than one minute ? ?CONTRAST: none ? ?FINDING(S): ?1.  Patent right internal jugular vein ? ?SPECIMEN(S):  None ? ?INDICATIONS:   ?Blake West is a 71 y.o.male who presents with renal failure and no current access.  The patient needs long term dialysis access for their ESRD, and a Permcath is necessary.  Risks and benefits are discussed and informed consent is obtained.   ? ?DESCRIPTION: ?After obtaining full informed written consent, the patient was brought back to the vascular suited. The patient's right neck and chest were sterilely prepped and draped in a sterile surgical field was created. Moderate conscious sedation was administered during a face to face encounter with the patient throughout the procedure with my supervision of the RN administering medicines and monitoring the patient's vital signs, pulse oximetry, telemetry and mental status throughout from the start of the procedure until the patient was taken to the recovery room.  The right internal jugular vein was visualized with ultrasound and found to be patent. It was then accessed under direct ultrasound guidance and a permanent image was recorded. A wire was placed. After skin nick and dilatation, the peel-away sheath was placed over the wire. ?I then turned my attention to an area under the clavicle. Approximately 1-2 fingerbreadths below the clavicle a small counterincision was created and tunneled from the  subclavicular incision to the access site. Using fluoroscopic guidance, a 19 centimeter tip to cuff tunneled hemodialysis catheter was selected, and tunneled from the subclavicular incision to the access site. It was then placed through the peel-away sheath and the peel-away sheath was removed. Using fluoroscopic guidance the catheter tips were parked in the right atrium. The appropriate distal connectors were placed. It withdrew blood well and flushed easily with heparinized saline and a concentrated heparin solution was then placed. It was secured to the chest wall with 2 Prolene sutures. The access incision was closed single 4-0 Monocryl. A 4-0 Monocryl pursestring suture was placed around the exit site. Sterile dressings were placed. ?The patient tolerated the procedure well and was taken to the recovery room in stable condition. ? ?COMPLICATIONS: None ? ?CONDITION: Stable ? ?[name], MD ?06/07/2021 ?12:20 PM ? ? ?This note was created with Dragon Medical transcription system. Any errors in dictation are purely unintentional.  ?

## 2021-06-08 DIAGNOSIS — I7782 Antineutrophilic cytoplasmic antibody (ANCA) vasculitis: Secondary | ICD-10-CM | POA: Diagnosis not present

## 2021-06-08 DIAGNOSIS — E119 Type 2 diabetes mellitus without complications: Secondary | ICD-10-CM | POA: Diagnosis not present

## 2021-06-08 DIAGNOSIS — Z992 Dependence on renal dialysis: Secondary | ICD-10-CM | POA: Diagnosis not present

## 2021-06-08 DIAGNOSIS — N186 End stage renal disease: Secondary | ICD-10-CM | POA: Diagnosis not present

## 2021-06-08 DIAGNOSIS — B0223 Postherpetic polyneuropathy: Secondary | ICD-10-CM | POA: Diagnosis not present

## 2021-06-10 DIAGNOSIS — I700 Atherosclerosis of aorta: Secondary | ICD-10-CM | POA: Diagnosis not present

## 2021-06-10 DIAGNOSIS — Z992 Dependence on renal dialysis: Secondary | ICD-10-CM | POA: Diagnosis not present

## 2021-06-10 DIAGNOSIS — I132 Hypertensive heart and chronic kidney disease with heart failure and with stage 5 chronic kidney disease, or end stage renal disease: Secondary | ICD-10-CM | POA: Diagnosis not present

## 2021-06-10 DIAGNOSIS — E039 Hypothyroidism, unspecified: Secondary | ICD-10-CM | POA: Diagnosis not present

## 2021-06-10 DIAGNOSIS — N186 End stage renal disease: Secondary | ICD-10-CM | POA: Diagnosis not present

## 2021-06-10 DIAGNOSIS — M109 Gout, unspecified: Secondary | ICD-10-CM | POA: Diagnosis not present

## 2021-06-10 DIAGNOSIS — Z955 Presence of coronary angioplasty implant and graft: Secondary | ICD-10-CM | POA: Diagnosis not present

## 2021-06-10 DIAGNOSIS — J9601 Acute respiratory failure with hypoxia: Secondary | ICD-10-CM | POA: Diagnosis not present

## 2021-06-10 DIAGNOSIS — I2510 Atherosclerotic heart disease of native coronary artery without angina pectoris: Secondary | ICD-10-CM | POA: Diagnosis not present

## 2021-06-10 DIAGNOSIS — N39498 Other specified urinary incontinence: Secondary | ICD-10-CM | POA: Diagnosis not present

## 2021-06-10 DIAGNOSIS — Z7952 Long term (current) use of systemic steroids: Secondary | ICD-10-CM | POA: Diagnosis not present

## 2021-06-10 DIAGNOSIS — J189 Pneumonia, unspecified organism: Secondary | ICD-10-CM | POA: Diagnosis not present

## 2021-06-10 DIAGNOSIS — R739 Hyperglycemia, unspecified: Secondary | ICD-10-CM | POA: Diagnosis not present

## 2021-06-10 DIAGNOSIS — E440 Moderate protein-calorie malnutrition: Secondary | ICD-10-CM | POA: Diagnosis not present

## 2021-06-10 DIAGNOSIS — I7782 Antineutrophilic cytoplasmic antibody (ANCA) vasculitis: Secondary | ICD-10-CM | POA: Diagnosis not present

## 2021-06-10 DIAGNOSIS — K5730 Diverticulosis of large intestine without perforation or abscess without bleeding: Secondary | ICD-10-CM | POA: Diagnosis not present

## 2021-06-10 DIAGNOSIS — I482 Chronic atrial fibrillation, unspecified: Secondary | ICD-10-CM | POA: Diagnosis not present

## 2021-06-10 DIAGNOSIS — N401 Enlarged prostate with lower urinary tract symptoms: Secondary | ICD-10-CM | POA: Diagnosis not present

## 2021-06-10 DIAGNOSIS — Z7901 Long term (current) use of anticoagulants: Secondary | ICD-10-CM | POA: Diagnosis not present

## 2021-06-10 DIAGNOSIS — I5022 Chronic systolic (congestive) heart failure: Secondary | ICD-10-CM | POA: Diagnosis not present

## 2021-06-14 DIAGNOSIS — E039 Hypothyroidism, unspecified: Secondary | ICD-10-CM | POA: Diagnosis not present

## 2021-06-14 DIAGNOSIS — Z955 Presence of coronary angioplasty implant and graft: Secondary | ICD-10-CM | POA: Diagnosis not present

## 2021-06-14 DIAGNOSIS — M109 Gout, unspecified: Secondary | ICD-10-CM | POA: Diagnosis not present

## 2021-06-14 DIAGNOSIS — I700 Atherosclerosis of aorta: Secondary | ICD-10-CM | POA: Diagnosis not present

## 2021-06-14 DIAGNOSIS — J189 Pneumonia, unspecified organism: Secondary | ICD-10-CM | POA: Diagnosis not present

## 2021-06-14 DIAGNOSIS — N39498 Other specified urinary incontinence: Secondary | ICD-10-CM | POA: Diagnosis not present

## 2021-06-14 DIAGNOSIS — R739 Hyperglycemia, unspecified: Secondary | ICD-10-CM | POA: Diagnosis not present

## 2021-06-14 DIAGNOSIS — N186 End stage renal disease: Secondary | ICD-10-CM | POA: Diagnosis not present

## 2021-06-14 DIAGNOSIS — E440 Moderate protein-calorie malnutrition: Secondary | ICD-10-CM | POA: Diagnosis not present

## 2021-06-14 DIAGNOSIS — I132 Hypertensive heart and chronic kidney disease with heart failure and with stage 5 chronic kidney disease, or end stage renal disease: Secondary | ICD-10-CM | POA: Diagnosis not present

## 2021-06-14 DIAGNOSIS — Z7952 Long term (current) use of systemic steroids: Secondary | ICD-10-CM | POA: Diagnosis not present

## 2021-06-14 DIAGNOSIS — Z992 Dependence on renal dialysis: Secondary | ICD-10-CM | POA: Diagnosis not present

## 2021-06-14 DIAGNOSIS — K5730 Diverticulosis of large intestine without perforation or abscess without bleeding: Secondary | ICD-10-CM | POA: Diagnosis not present

## 2021-06-14 DIAGNOSIS — I2510 Atherosclerotic heart disease of native coronary artery without angina pectoris: Secondary | ICD-10-CM | POA: Diagnosis not present

## 2021-06-14 DIAGNOSIS — I482 Chronic atrial fibrillation, unspecified: Secondary | ICD-10-CM | POA: Diagnosis not present

## 2021-06-14 DIAGNOSIS — I7782 Antineutrophilic cytoplasmic antibody (ANCA) vasculitis: Secondary | ICD-10-CM | POA: Diagnosis not present

## 2021-06-14 DIAGNOSIS — J9601 Acute respiratory failure with hypoxia: Secondary | ICD-10-CM | POA: Diagnosis not present

## 2021-06-14 DIAGNOSIS — Z7901 Long term (current) use of anticoagulants: Secondary | ICD-10-CM | POA: Diagnosis not present

## 2021-06-14 DIAGNOSIS — I5022 Chronic systolic (congestive) heart failure: Secondary | ICD-10-CM | POA: Diagnosis not present

## 2021-06-14 DIAGNOSIS — N401 Enlarged prostate with lower urinary tract symptoms: Secondary | ICD-10-CM | POA: Diagnosis not present

## 2021-06-17 ENCOUNTER — Ambulatory Visit: Payer: PPO | Admitting: Dietician

## 2021-06-18 DIAGNOSIS — J9601 Acute respiratory failure with hypoxia: Secondary | ICD-10-CM | POA: Diagnosis not present

## 2021-06-18 DIAGNOSIS — Z955 Presence of coronary angioplasty implant and graft: Secondary | ICD-10-CM | POA: Diagnosis not present

## 2021-06-18 DIAGNOSIS — I132 Hypertensive heart and chronic kidney disease with heart failure and with stage 5 chronic kidney disease, or end stage renal disease: Secondary | ICD-10-CM | POA: Diagnosis not present

## 2021-06-18 DIAGNOSIS — N401 Enlarged prostate with lower urinary tract symptoms: Secondary | ICD-10-CM | POA: Diagnosis not present

## 2021-06-18 DIAGNOSIS — R739 Hyperglycemia, unspecified: Secondary | ICD-10-CM | POA: Diagnosis not present

## 2021-06-18 DIAGNOSIS — Z7952 Long term (current) use of systemic steroids: Secondary | ICD-10-CM | POA: Diagnosis not present

## 2021-06-18 DIAGNOSIS — I5022 Chronic systolic (congestive) heart failure: Secondary | ICD-10-CM | POA: Diagnosis not present

## 2021-06-18 DIAGNOSIS — N186 End stage renal disease: Secondary | ICD-10-CM | POA: Diagnosis not present

## 2021-06-18 DIAGNOSIS — N39498 Other specified urinary incontinence: Secondary | ICD-10-CM | POA: Diagnosis not present

## 2021-06-18 DIAGNOSIS — Z7901 Long term (current) use of anticoagulants: Secondary | ICD-10-CM | POA: Diagnosis not present

## 2021-06-18 DIAGNOSIS — E440 Moderate protein-calorie malnutrition: Secondary | ICD-10-CM | POA: Diagnosis not present

## 2021-06-18 DIAGNOSIS — J189 Pneumonia, unspecified organism: Secondary | ICD-10-CM | POA: Diagnosis not present

## 2021-06-18 DIAGNOSIS — I428 Other cardiomyopathies: Secondary | ICD-10-CM | POA: Diagnosis not present

## 2021-06-18 DIAGNOSIS — I2510 Atherosclerotic heart disease of native coronary artery without angina pectoris: Secondary | ICD-10-CM | POA: Diagnosis not present

## 2021-06-18 DIAGNOSIS — I700 Atherosclerosis of aorta: Secondary | ICD-10-CM | POA: Diagnosis not present

## 2021-06-18 DIAGNOSIS — I7782 Antineutrophilic cytoplasmic antibody (ANCA) vasculitis: Secondary | ICD-10-CM | POA: Diagnosis not present

## 2021-06-18 DIAGNOSIS — K5730 Diverticulosis of large intestine without perforation or abscess without bleeding: Secondary | ICD-10-CM | POA: Diagnosis not present

## 2021-06-18 DIAGNOSIS — Z992 Dependence on renal dialysis: Secondary | ICD-10-CM | POA: Diagnosis not present

## 2021-06-18 DIAGNOSIS — R0989 Other specified symptoms and signs involving the circulatory and respiratory systems: Secondary | ICD-10-CM | POA: Diagnosis not present

## 2021-06-18 DIAGNOSIS — I10 Essential (primary) hypertension: Secondary | ICD-10-CM | POA: Diagnosis not present

## 2021-06-18 DIAGNOSIS — E039 Hypothyroidism, unspecified: Secondary | ICD-10-CM | POA: Diagnosis not present

## 2021-06-18 DIAGNOSIS — M109 Gout, unspecified: Secondary | ICD-10-CM | POA: Diagnosis not present

## 2021-06-18 DIAGNOSIS — J811 Chronic pulmonary edema: Secondary | ICD-10-CM | POA: Diagnosis not present

## 2021-06-18 DIAGNOSIS — I482 Chronic atrial fibrillation, unspecified: Secondary | ICD-10-CM | POA: Diagnosis not present

## 2021-06-23 DIAGNOSIS — E440 Moderate protein-calorie malnutrition: Secondary | ICD-10-CM | POA: Diagnosis not present

## 2021-06-23 DIAGNOSIS — I7782 Antineutrophilic cytoplasmic antibody (ANCA) vasculitis: Secondary | ICD-10-CM | POA: Diagnosis not present

## 2021-06-23 DIAGNOSIS — I2510 Atherosclerotic heart disease of native coronary artery without angina pectoris: Secondary | ICD-10-CM | POA: Diagnosis not present

## 2021-06-23 DIAGNOSIS — R739 Hyperglycemia, unspecified: Secondary | ICD-10-CM | POA: Diagnosis not present

## 2021-06-23 DIAGNOSIS — Z955 Presence of coronary angioplasty implant and graft: Secondary | ICD-10-CM | POA: Diagnosis not present

## 2021-06-23 DIAGNOSIS — N39498 Other specified urinary incontinence: Secondary | ICD-10-CM | POA: Diagnosis not present

## 2021-06-23 DIAGNOSIS — M109 Gout, unspecified: Secondary | ICD-10-CM | POA: Diagnosis not present

## 2021-06-23 DIAGNOSIS — N186 End stage renal disease: Secondary | ICD-10-CM | POA: Diagnosis not present

## 2021-06-23 DIAGNOSIS — J189 Pneumonia, unspecified organism: Secondary | ICD-10-CM | POA: Diagnosis not present

## 2021-06-23 DIAGNOSIS — I5022 Chronic systolic (congestive) heart failure: Secondary | ICD-10-CM | POA: Diagnosis not present

## 2021-06-23 DIAGNOSIS — E039 Hypothyroidism, unspecified: Secondary | ICD-10-CM | POA: Diagnosis not present

## 2021-06-23 DIAGNOSIS — I482 Chronic atrial fibrillation, unspecified: Secondary | ICD-10-CM | POA: Diagnosis not present

## 2021-06-23 DIAGNOSIS — Z992 Dependence on renal dialysis: Secondary | ICD-10-CM | POA: Diagnosis not present

## 2021-06-23 DIAGNOSIS — J9601 Acute respiratory failure with hypoxia: Secondary | ICD-10-CM | POA: Diagnosis not present

## 2021-06-23 DIAGNOSIS — I132 Hypertensive heart and chronic kidney disease with heart failure and with stage 5 chronic kidney disease, or end stage renal disease: Secondary | ICD-10-CM | POA: Diagnosis not present

## 2021-06-23 DIAGNOSIS — Z7901 Long term (current) use of anticoagulants: Secondary | ICD-10-CM | POA: Diagnosis not present

## 2021-06-23 DIAGNOSIS — Z7952 Long term (current) use of systemic steroids: Secondary | ICD-10-CM | POA: Diagnosis not present

## 2021-06-23 DIAGNOSIS — K5730 Diverticulosis of large intestine without perforation or abscess without bleeding: Secondary | ICD-10-CM | POA: Diagnosis not present

## 2021-06-23 DIAGNOSIS — I700 Atherosclerosis of aorta: Secondary | ICD-10-CM | POA: Diagnosis not present

## 2021-06-23 DIAGNOSIS — N401 Enlarged prostate with lower urinary tract symptoms: Secondary | ICD-10-CM | POA: Diagnosis not present

## 2021-06-24 DIAGNOSIS — Z992 Dependence on renal dialysis: Secondary | ICD-10-CM | POA: Diagnosis not present

## 2021-06-24 DIAGNOSIS — D649 Anemia, unspecified: Secondary | ICD-10-CM | POA: Diagnosis not present

## 2021-06-24 DIAGNOSIS — D631 Anemia in chronic kidney disease: Secondary | ICD-10-CM | POA: Diagnosis not present

## 2021-06-24 DIAGNOSIS — N184 Chronic kidney disease, stage 4 (severe): Secondary | ICD-10-CM | POA: Diagnosis not present

## 2021-06-24 DIAGNOSIS — N186 End stage renal disease: Secondary | ICD-10-CM | POA: Diagnosis not present

## 2021-06-24 DIAGNOSIS — N179 Acute kidney failure, unspecified: Secondary | ICD-10-CM | POA: Diagnosis not present

## 2021-06-25 DIAGNOSIS — N186 End stage renal disease: Secondary | ICD-10-CM | POA: Diagnosis not present

## 2021-06-25 DIAGNOSIS — J189 Pneumonia, unspecified organism: Secondary | ICD-10-CM | POA: Diagnosis not present

## 2021-06-25 DIAGNOSIS — I2510 Atherosclerotic heart disease of native coronary artery without angina pectoris: Secondary | ICD-10-CM | POA: Diagnosis not present

## 2021-06-25 DIAGNOSIS — N39498 Other specified urinary incontinence: Secondary | ICD-10-CM | POA: Diagnosis not present

## 2021-06-25 DIAGNOSIS — I700 Atherosclerosis of aorta: Secondary | ICD-10-CM | POA: Diagnosis not present

## 2021-06-25 DIAGNOSIS — K5730 Diverticulosis of large intestine without perforation or abscess without bleeding: Secondary | ICD-10-CM | POA: Diagnosis not present

## 2021-06-25 DIAGNOSIS — Z992 Dependence on renal dialysis: Secondary | ICD-10-CM | POA: Diagnosis not present

## 2021-06-25 DIAGNOSIS — Z7952 Long term (current) use of systemic steroids: Secondary | ICD-10-CM | POA: Diagnosis not present

## 2021-06-25 DIAGNOSIS — E039 Hypothyroidism, unspecified: Secondary | ICD-10-CM | POA: Diagnosis not present

## 2021-06-25 DIAGNOSIS — Z955 Presence of coronary angioplasty implant and graft: Secondary | ICD-10-CM | POA: Diagnosis not present

## 2021-06-25 DIAGNOSIS — I132 Hypertensive heart and chronic kidney disease with heart failure and with stage 5 chronic kidney disease, or end stage renal disease: Secondary | ICD-10-CM | POA: Diagnosis not present

## 2021-06-25 DIAGNOSIS — I482 Chronic atrial fibrillation, unspecified: Secondary | ICD-10-CM | POA: Diagnosis not present

## 2021-06-25 DIAGNOSIS — E440 Moderate protein-calorie malnutrition: Secondary | ICD-10-CM | POA: Diagnosis not present

## 2021-06-25 DIAGNOSIS — M109 Gout, unspecified: Secondary | ICD-10-CM | POA: Diagnosis not present

## 2021-06-25 DIAGNOSIS — R739 Hyperglycemia, unspecified: Secondary | ICD-10-CM | POA: Diagnosis not present

## 2021-06-25 DIAGNOSIS — N401 Enlarged prostate with lower urinary tract symptoms: Secondary | ICD-10-CM | POA: Diagnosis not present

## 2021-06-25 DIAGNOSIS — J9601 Acute respiratory failure with hypoxia: Secondary | ICD-10-CM | POA: Diagnosis not present

## 2021-06-25 DIAGNOSIS — I5022 Chronic systolic (congestive) heart failure: Secondary | ICD-10-CM | POA: Diagnosis not present

## 2021-06-25 DIAGNOSIS — Z7901 Long term (current) use of anticoagulants: Secondary | ICD-10-CM | POA: Diagnosis not present

## 2021-06-25 DIAGNOSIS — I7782 Antineutrophilic cytoplasmic antibody (ANCA) vasculitis: Secondary | ICD-10-CM | POA: Diagnosis not present

## 2021-06-28 DIAGNOSIS — I132 Hypertensive heart and chronic kidney disease with heart failure and with stage 5 chronic kidney disease, or end stage renal disease: Secondary | ICD-10-CM | POA: Diagnosis not present

## 2021-06-28 DIAGNOSIS — Z955 Presence of coronary angioplasty implant and graft: Secondary | ICD-10-CM | POA: Diagnosis not present

## 2021-06-28 DIAGNOSIS — Z7901 Long term (current) use of anticoagulants: Secondary | ICD-10-CM | POA: Diagnosis not present

## 2021-06-28 DIAGNOSIS — I2510 Atherosclerotic heart disease of native coronary artery without angina pectoris: Secondary | ICD-10-CM | POA: Diagnosis not present

## 2021-06-28 DIAGNOSIS — N401 Enlarged prostate with lower urinary tract symptoms: Secondary | ICD-10-CM | POA: Diagnosis not present

## 2021-06-28 DIAGNOSIS — I700 Atherosclerosis of aorta: Secondary | ICD-10-CM | POA: Diagnosis not present

## 2021-06-28 DIAGNOSIS — Z992 Dependence on renal dialysis: Secondary | ICD-10-CM | POA: Diagnosis not present

## 2021-06-28 DIAGNOSIS — I482 Chronic atrial fibrillation, unspecified: Secondary | ICD-10-CM | POA: Diagnosis not present

## 2021-06-28 DIAGNOSIS — I7782 Antineutrophilic cytoplasmic antibody (ANCA) vasculitis: Secondary | ICD-10-CM | POA: Diagnosis not present

## 2021-06-28 DIAGNOSIS — M109 Gout, unspecified: Secondary | ICD-10-CM | POA: Diagnosis not present

## 2021-06-28 DIAGNOSIS — Z7952 Long term (current) use of systemic steroids: Secondary | ICD-10-CM | POA: Diagnosis not present

## 2021-06-28 DIAGNOSIS — I5022 Chronic systolic (congestive) heart failure: Secondary | ICD-10-CM | POA: Diagnosis not present

## 2021-06-28 DIAGNOSIS — R739 Hyperglycemia, unspecified: Secondary | ICD-10-CM | POA: Diagnosis not present

## 2021-06-28 DIAGNOSIS — J9601 Acute respiratory failure with hypoxia: Secondary | ICD-10-CM | POA: Diagnosis not present

## 2021-06-28 DIAGNOSIS — J189 Pneumonia, unspecified organism: Secondary | ICD-10-CM | POA: Diagnosis not present

## 2021-06-28 DIAGNOSIS — E039 Hypothyroidism, unspecified: Secondary | ICD-10-CM | POA: Diagnosis not present

## 2021-06-28 DIAGNOSIS — E440 Moderate protein-calorie malnutrition: Secondary | ICD-10-CM | POA: Diagnosis not present

## 2021-06-28 DIAGNOSIS — N186 End stage renal disease: Secondary | ICD-10-CM | POA: Diagnosis not present

## 2021-06-28 DIAGNOSIS — K5730 Diverticulosis of large intestine without perforation or abscess without bleeding: Secondary | ICD-10-CM | POA: Diagnosis not present

## 2021-06-28 DIAGNOSIS — N39498 Other specified urinary incontinence: Secondary | ICD-10-CM | POA: Diagnosis not present

## 2021-06-30 DIAGNOSIS — Z7952 Long term (current) use of systemic steroids: Secondary | ICD-10-CM | POA: Diagnosis not present

## 2021-06-30 DIAGNOSIS — K5730 Diverticulosis of large intestine without perforation or abscess without bleeding: Secondary | ICD-10-CM | POA: Diagnosis not present

## 2021-06-30 DIAGNOSIS — I2510 Atherosclerotic heart disease of native coronary artery without angina pectoris: Secondary | ICD-10-CM | POA: Diagnosis not present

## 2021-06-30 DIAGNOSIS — M109 Gout, unspecified: Secondary | ICD-10-CM | POA: Diagnosis not present

## 2021-06-30 DIAGNOSIS — N186 End stage renal disease: Secondary | ICD-10-CM | POA: Diagnosis not present

## 2021-06-30 DIAGNOSIS — I700 Atherosclerosis of aorta: Secondary | ICD-10-CM | POA: Diagnosis not present

## 2021-06-30 DIAGNOSIS — I7782 Antineutrophilic cytoplasmic antibody (ANCA) vasculitis: Secondary | ICD-10-CM | POA: Diagnosis not present

## 2021-06-30 DIAGNOSIS — I132 Hypertensive heart and chronic kidney disease with heart failure and with stage 5 chronic kidney disease, or end stage renal disease: Secondary | ICD-10-CM | POA: Diagnosis not present

## 2021-06-30 DIAGNOSIS — R739 Hyperglycemia, unspecified: Secondary | ICD-10-CM | POA: Diagnosis not present

## 2021-06-30 DIAGNOSIS — I5022 Chronic systolic (congestive) heart failure: Secondary | ICD-10-CM | POA: Diagnosis not present

## 2021-06-30 DIAGNOSIS — J189 Pneumonia, unspecified organism: Secondary | ICD-10-CM | POA: Diagnosis not present

## 2021-06-30 DIAGNOSIS — N401 Enlarged prostate with lower urinary tract symptoms: Secondary | ICD-10-CM | POA: Diagnosis not present

## 2021-06-30 DIAGNOSIS — Z992 Dependence on renal dialysis: Secondary | ICD-10-CM | POA: Diagnosis not present

## 2021-06-30 DIAGNOSIS — N39498 Other specified urinary incontinence: Secondary | ICD-10-CM | POA: Diagnosis not present

## 2021-06-30 DIAGNOSIS — Z955 Presence of coronary angioplasty implant and graft: Secondary | ICD-10-CM | POA: Diagnosis not present

## 2021-06-30 DIAGNOSIS — I482 Chronic atrial fibrillation, unspecified: Secondary | ICD-10-CM | POA: Diagnosis not present

## 2021-06-30 DIAGNOSIS — Z7901 Long term (current) use of anticoagulants: Secondary | ICD-10-CM | POA: Diagnosis not present

## 2021-06-30 DIAGNOSIS — E440 Moderate protein-calorie malnutrition: Secondary | ICD-10-CM | POA: Diagnosis not present

## 2021-06-30 DIAGNOSIS — J9601 Acute respiratory failure with hypoxia: Secondary | ICD-10-CM | POA: Diagnosis not present

## 2021-06-30 DIAGNOSIS — E039 Hypothyroidism, unspecified: Secondary | ICD-10-CM | POA: Diagnosis not present

## 2021-07-02 DIAGNOSIS — M9903 Segmental and somatic dysfunction of lumbar region: Secondary | ICD-10-CM | POA: Diagnosis not present

## 2021-07-02 DIAGNOSIS — M9901 Segmental and somatic dysfunction of cervical region: Secondary | ICD-10-CM | POA: Diagnosis not present

## 2021-07-02 DIAGNOSIS — M9905 Segmental and somatic dysfunction of pelvic region: Secondary | ICD-10-CM | POA: Diagnosis not present

## 2021-07-02 DIAGNOSIS — M9904 Segmental and somatic dysfunction of sacral region: Secondary | ICD-10-CM | POA: Diagnosis not present

## 2021-07-03 ENCOUNTER — Emergency Department (HOSPITAL_COMMUNITY): Payer: PPO

## 2021-07-03 ENCOUNTER — Other Ambulatory Visit: Payer: Self-pay

## 2021-07-03 ENCOUNTER — Inpatient Hospital Stay (HOSPITAL_COMMUNITY)
Admission: EM | Admit: 2021-07-03 | Discharge: 2021-07-09 | DRG: 521 | Disposition: A | Discharge status: Skilled Nursing Facility | Payer: PPO | Attending: Family Medicine | Admitting: Family Medicine

## 2021-07-03 DIAGNOSIS — E1169 Type 2 diabetes mellitus with other specified complication: Secondary | ICD-10-CM | POA: Diagnosis not present

## 2021-07-03 DIAGNOSIS — N2581 Secondary hyperparathyroidism of renal origin: Secondary | ICD-10-CM | POA: Diagnosis present

## 2021-07-03 DIAGNOSIS — I132 Hypertensive heart and chronic kidney disease with heart failure and with stage 5 chronic kidney disease, or end stage renal disease: Secondary | ICD-10-CM | POA: Diagnosis not present

## 2021-07-03 DIAGNOSIS — M6259 Muscle wasting and atrophy, not elsewhere classified, multiple sites: Secondary | ICD-10-CM | POA: Diagnosis not present

## 2021-07-03 DIAGNOSIS — I2510 Atherosclerotic heart disease of native coronary artery without angina pectoris: Secondary | ICD-10-CM | POA: Diagnosis not present

## 2021-07-03 DIAGNOSIS — S79929A Unspecified injury of unspecified thigh, initial encounter: Secondary | ICD-10-CM | POA: Diagnosis not present

## 2021-07-03 DIAGNOSIS — Z79899 Other long term (current) drug therapy: Secondary | ICD-10-CM

## 2021-07-03 DIAGNOSIS — R410 Disorientation, unspecified: Secondary | ICD-10-CM | POA: Diagnosis present

## 2021-07-03 DIAGNOSIS — W010XXA Fall on same level from slipping, tripping and stumbling without subsequent striking against object, initial encounter: Secondary | ICD-10-CM | POA: Diagnosis present

## 2021-07-03 DIAGNOSIS — Z7952 Long term (current) use of systemic steroids: Secondary | ICD-10-CM

## 2021-07-03 DIAGNOSIS — E038 Other specified hypothyroidism: Secondary | ICD-10-CM | POA: Diagnosis not present

## 2021-07-03 DIAGNOSIS — M7989 Other specified soft tissue disorders: Secondary | ICD-10-CM | POA: Diagnosis not present

## 2021-07-03 DIAGNOSIS — Z7901 Long term (current) use of anticoagulants: Secondary | ICD-10-CM | POA: Diagnosis not present

## 2021-07-03 DIAGNOSIS — M25551 Pain in right hip: Secondary | ICD-10-CM | POA: Diagnosis not present

## 2021-07-03 DIAGNOSIS — M6281 Muscle weakness (generalized): Secondary | ICD-10-CM | POA: Diagnosis not present

## 2021-07-03 DIAGNOSIS — G8911 Acute pain due to trauma: Secondary | ICD-10-CM | POA: Diagnosis not present

## 2021-07-03 DIAGNOSIS — D539 Nutritional anemia, unspecified: Secondary | ICD-10-CM | POA: Diagnosis not present

## 2021-07-03 DIAGNOSIS — N186 End stage renal disease: Secondary | ICD-10-CM

## 2021-07-03 DIAGNOSIS — I482 Chronic atrial fibrillation, unspecified: Secondary | ICD-10-CM | POA: Diagnosis present

## 2021-07-03 DIAGNOSIS — R1312 Dysphagia, oropharyngeal phase: Secondary | ICD-10-CM | POA: Diagnosis not present

## 2021-07-03 DIAGNOSIS — I7782 Antineutrophilic cytoplasmic antibody (ANCA) vasculitis: Secondary | ICD-10-CM | POA: Diagnosis present

## 2021-07-03 DIAGNOSIS — J449 Chronic obstructive pulmonary disease, unspecified: Secondary | ICD-10-CM | POA: Diagnosis not present

## 2021-07-03 DIAGNOSIS — Z888 Allergy status to other drugs, medicaments and biological substances status: Secondary | ICD-10-CM | POA: Diagnosis not present

## 2021-07-03 DIAGNOSIS — R531 Weakness: Secondary | ICD-10-CM | POA: Diagnosis not present

## 2021-07-03 DIAGNOSIS — R4182 Altered mental status, unspecified: Secondary | ICD-10-CM | POA: Diagnosis not present

## 2021-07-03 DIAGNOSIS — G4733 Obstructive sleep apnea (adult) (pediatric): Secondary | ICD-10-CM | POA: Diagnosis not present

## 2021-07-03 DIAGNOSIS — Z471 Aftercare following joint replacement surgery: Secondary | ICD-10-CM | POA: Diagnosis not present

## 2021-07-03 DIAGNOSIS — I5042 Chronic combined systolic (congestive) and diastolic (congestive) heart failure: Secondary | ICD-10-CM | POA: Diagnosis present

## 2021-07-03 DIAGNOSIS — Z885 Allergy status to narcotic agent status: Secondary | ICD-10-CM | POA: Diagnosis not present

## 2021-07-03 DIAGNOSIS — R41841 Cognitive communication deficit: Secondary | ICD-10-CM | POA: Diagnosis not present

## 2021-07-03 DIAGNOSIS — S72001A Fracture of unspecified part of neck of right femur, initial encounter for closed fracture: Secondary | ICD-10-CM | POA: Diagnosis not present

## 2021-07-03 DIAGNOSIS — I4821 Permanent atrial fibrillation: Secondary | ICD-10-CM | POA: Diagnosis not present

## 2021-07-03 DIAGNOSIS — I120 Hypertensive chronic kidney disease with stage 5 chronic kidney disease or end stage renal disease: Secondary | ICD-10-CM | POA: Diagnosis not present

## 2021-07-03 DIAGNOSIS — R2689 Other abnormalities of gait and mobility: Secondary | ICD-10-CM | POA: Diagnosis not present

## 2021-07-03 DIAGNOSIS — I420 Dilated cardiomyopathy: Secondary | ICD-10-CM | POA: Diagnosis present

## 2021-07-03 DIAGNOSIS — I5022 Chronic systolic (congestive) heart failure: Secondary | ICD-10-CM | POA: Diagnosis present

## 2021-07-03 DIAGNOSIS — Z01810 Encounter for preprocedural cardiovascular examination: Secondary | ICD-10-CM | POA: Diagnosis not present

## 2021-07-03 DIAGNOSIS — I509 Heart failure, unspecified: Secondary | ICD-10-CM | POA: Diagnosis not present

## 2021-07-03 DIAGNOSIS — W19XXXA Unspecified fall, initial encounter: Principal | ICD-10-CM

## 2021-07-03 DIAGNOSIS — D696 Thrombocytopenia, unspecified: Secondary | ICD-10-CM | POA: Diagnosis not present

## 2021-07-03 DIAGNOSIS — R0689 Other abnormalities of breathing: Secondary | ICD-10-CM | POA: Diagnosis not present

## 2021-07-03 DIAGNOSIS — I152 Hypertension secondary to endocrine disorders: Secondary | ICD-10-CM | POA: Diagnosis not present

## 2021-07-03 DIAGNOSIS — D631 Anemia in chronic kidney disease: Secondary | ICD-10-CM | POA: Diagnosis not present

## 2021-07-03 DIAGNOSIS — E039 Hypothyroidism, unspecified: Secondary | ICD-10-CM | POA: Diagnosis not present

## 2021-07-03 DIAGNOSIS — Z8249 Family history of ischemic heart disease and other diseases of the circulatory system: Secondary | ICD-10-CM

## 2021-07-03 DIAGNOSIS — Z6830 Body mass index (BMI) 30.0-30.9, adult: Secondary | ICD-10-CM

## 2021-07-03 DIAGNOSIS — I4891 Unspecified atrial fibrillation: Secondary | ICD-10-CM | POA: Diagnosis not present

## 2021-07-03 DIAGNOSIS — E1122 Type 2 diabetes mellitus with diabetic chronic kidney disease: Secondary | ICD-10-CM | POA: Diagnosis present

## 2021-07-03 DIAGNOSIS — E875 Hyperkalemia: Secondary | ICD-10-CM | POA: Diagnosis not present

## 2021-07-03 DIAGNOSIS — I428 Other cardiomyopathies: Secondary | ICD-10-CM | POA: Diagnosis not present

## 2021-07-03 DIAGNOSIS — Z66 Do not resuscitate: Secondary | ICD-10-CM | POA: Diagnosis not present

## 2021-07-03 DIAGNOSIS — E119 Type 2 diabetes mellitus without complications: Secondary | ICD-10-CM

## 2021-07-03 DIAGNOSIS — Z833 Family history of diabetes mellitus: Secondary | ICD-10-CM

## 2021-07-03 DIAGNOSIS — R262 Difficulty in walking, not elsewhere classified: Secondary | ICD-10-CM | POA: Diagnosis not present

## 2021-07-03 DIAGNOSIS — Z992 Dependence on renal dialysis: Secondary | ICD-10-CM | POA: Diagnosis not present

## 2021-07-03 DIAGNOSIS — Z743 Need for continuous supervision: Secondary | ICD-10-CM | POA: Diagnosis not present

## 2021-07-03 DIAGNOSIS — Z96641 Presence of right artificial hip joint: Secondary | ICD-10-CM | POA: Diagnosis not present

## 2021-07-03 DIAGNOSIS — N250 Renal osteodystrophy: Secondary | ICD-10-CM | POA: Diagnosis not present

## 2021-07-03 DIAGNOSIS — Y92009 Unspecified place in unspecified non-institutional (private) residence as the place of occurrence of the external cause: Secondary | ICD-10-CM

## 2021-07-03 DIAGNOSIS — G8929 Other chronic pain: Secondary | ICD-10-CM | POA: Diagnosis present

## 2021-07-03 DIAGNOSIS — Z043 Encounter for examination and observation following other accident: Secondary | ICD-10-CM | POA: Diagnosis not present

## 2021-07-03 DIAGNOSIS — E785 Hyperlipidemia, unspecified: Secondary | ICD-10-CM | POA: Diagnosis present

## 2021-07-03 DIAGNOSIS — M84757A Complete oblique atypical femoral fracture, right leg, initial encounter for fracture: Secondary | ICD-10-CM | POA: Diagnosis not present

## 2021-07-03 DIAGNOSIS — S72041A Displaced fracture of base of neck of right femur, initial encounter for closed fracture: Secondary | ICD-10-CM | POA: Diagnosis not present

## 2021-07-03 DIAGNOSIS — E1159 Type 2 diabetes mellitus with other circulatory complications: Secondary | ICD-10-CM | POA: Diagnosis not present

## 2021-07-03 DIAGNOSIS — Z87891 Personal history of nicotine dependence: Secondary | ICD-10-CM

## 2021-07-03 HISTORY — DX: Other shock: R57.8

## 2021-07-03 LAB — COMPREHENSIVE METABOLIC PANEL
ALT: 31 U/L (ref 0–44)
AST: 24 U/L (ref 15–41)
Albumin: 3.1 g/dL — ABNORMAL LOW (ref 3.5–5.0)
Alkaline Phosphatase: 39 U/L (ref 38–126)
Anion gap: 17 — ABNORMAL HIGH (ref 5–15)
BUN: 74 mg/dL — ABNORMAL HIGH (ref 8–23)
CO2: 24 mmol/L (ref 22–32)
Calcium: 8.1 mg/dL — ABNORMAL LOW (ref 8.9–10.3)
Chloride: 97 mmol/L — ABNORMAL LOW (ref 98–111)
Creatinine, Ser: 4.67 mg/dL — ABNORMAL HIGH (ref 0.61–1.24)
GFR, Estimated: 13 mL/min — ABNORMAL LOW (ref >60)
Glucose, Bld: 137 mg/dL — ABNORMAL HIGH (ref 70–99)
Potassium: 4.7 mmol/L (ref 3.5–5.1)
Sodium: 138 mmol/L (ref 135–145)
Total Bilirubin: 0.7 mg/dL (ref 0.3–1.2)
Total Protein: 5.7 g/dL — ABNORMAL LOW (ref 6.5–8.1)

## 2021-07-03 LAB — CBC WITH DIFFERENTIAL/PLATELET
Abs Immature Granulocytes: 0.3 10*3/uL — ABNORMAL HIGH (ref 0.00–0.07)
Basophils Absolute: 0 10*3/uL (ref 0.0–0.1)
Basophils Relative: 0 %
Eosinophils Absolute: 0 10*3/uL (ref 0.0–0.5)
Eosinophils Relative: 0 %
HCT: 27.3 % — ABNORMAL LOW (ref 39.0–52.0)
Hemoglobin: 8.8 g/dL — ABNORMAL LOW (ref 13.0–17.0)
Immature Granulocytes: 4 %
Lymphocytes Relative: 14 %
Lymphs Abs: 1 10*3/uL (ref 0.7–4.0)
MCH: 33.1 pg (ref 26.0–34.0)
MCHC: 32.2 g/dL (ref 30.0–36.0)
MCV: 102.6 fL — ABNORMAL HIGH (ref 80.0–100.0)
Monocytes Absolute: 0.7 10*3/uL (ref 0.1–1.0)
Monocytes Relative: 9 %
Neutro Abs: 5.3 10*3/uL (ref 1.7–7.7)
Neutrophils Relative %: 73 %
Platelets: 145 10*3/uL — ABNORMAL LOW (ref 150–400)
RBC: 2.66 MIL/uL — ABNORMAL LOW (ref 4.22–5.81)
RDW: 17.1 % — ABNORMAL HIGH (ref 11.5–15.5)
WBC: 7.3 10*3/uL (ref 4.0–10.5)
nRBC: 0.5 % — ABNORMAL HIGH (ref 0.0–0.2)

## 2021-07-03 LAB — CBG MONITORING, ED: Glucose-Capillary: 125 mg/dL — ABNORMAL HIGH (ref 70–99)

## 2021-07-03 LAB — PROTIME-INR
INR: 1.1 (ref 0.8–1.2)
Prothrombin Time: 13.6 seconds (ref 11.4–15.2)

## 2021-07-03 LAB — AMMONIA: Ammonia: 11 umol/L (ref 9–35)

## 2021-07-03 MED ORDER — SULFAMETHOXAZOLE-TRIMETHOPRIM 800-160 MG PO TABS
1.0000 | ORAL_TABLET | ORAL | Status: DC
Start: 2021-07-05 — End: 2021-07-09
  Administered 2021-07-05 – 2021-07-09 (×3): 1 via ORAL
  Filled 2021-07-03 (×3): qty 1

## 2021-07-03 MED ORDER — METOPROLOL TARTRATE 5 MG/5ML IV SOLN: 5.0000 mg | Freq: Once | INTRAVENOUS | Status: DC

## 2021-07-03 MED ORDER — PANTOPRAZOLE SODIUM 40 MG PO TBEC
40.0000 mg | DELAYED_RELEASE_TABLET | Freq: Two times a day (BID) | ORAL | Status: DC
  Administered 2021-07-03 – 2021-07-09 (×12): 40 mg via ORAL
  Filled 2021-07-03 (×13): qty 1

## 2021-07-03 MED ORDER — MIDODRINE HCL 5 MG PO TABS
10.0000 mg | ORAL_TABLET | ORAL | Status: DC
Start: 2021-07-05 — End: 2021-07-05
  Filled 2021-07-03: qty 2

## 2021-07-03 MED ORDER — METOPROLOL SUCCINATE ER 50 MG PO TB24
50.0000 mg | ORAL_TABLET | Freq: Every day | ORAL | Status: DC
  Administered 2021-07-04: 50 mg via ORAL
  Filled 2021-07-03: qty 1

## 2021-07-03 MED ORDER — MAGNESIUM 30 MG PO TABS
30.0000 mg | ORAL_TABLET | Freq: Two times a day (BID) | ORAL | Status: DC
Start: 2021-07-03 — End: 2021-07-03

## 2021-07-03 MED ORDER — VITAMIN C 250 MG PO TABS
125.0000 mg | ORAL_TABLET | Freq: Every day | ORAL | Status: DC
  Administered 2021-07-04 – 2021-07-09 (×6): 125 mg via ORAL
  Filled 2021-07-03 (×6): qty 1

## 2021-07-03 MED ORDER — OMEGA-3-ACID ETHYL ESTERS 1 G PO CAPS
1.0000 g | ORAL_CAPSULE | Freq: Two times a day (BID) | ORAL | Status: DC
Start: 2021-07-03 — End: 2021-07-09
  Administered 2021-07-04 – 2021-07-09 (×11): 1 g via ORAL
  Filled 2021-07-03 (×12): qty 1

## 2021-07-03 MED ORDER — OYSTER SHELL CALCIUM/D3 500-5 MG-MCG PO TABS
1.0000 | ORAL_TABLET | Freq: Every day | ORAL | Status: DC
  Administered 2021-07-04 – 2021-07-09 (×5): 1 via ORAL
  Filled 2021-07-03 (×5): qty 1

## 2021-07-03 MED ORDER — OXYCODONE HCL 5 MG PO TABS
2.5000 mg | ORAL_TABLET | Freq: Four times a day (QID) | ORAL | Status: DC
  Administered 2021-07-03 – 2021-07-04 (×5): 2.5 mg via ORAL
  Filled 2021-07-03 (×5): qty 1

## 2021-07-03 MED ORDER — QUETIAPINE FUMARATE 100 MG PO TABS
100.0000 mg | ORAL_TABLET | Freq: Every day | ORAL | Status: DC
  Administered 2021-07-03 – 2021-07-08 (×6): 100 mg via ORAL
  Filled 2021-07-03: qty 4
  Filled 2021-07-03 (×5): qty 1

## 2021-07-03 MED ORDER — POLYETHYLENE GLYCOL 3350 17 G PO PACK
17.0000 g | PACK | Freq: Every day | ORAL | Status: DC | PRN
  Administered 2021-07-08: 17 g via ORAL
  Filled 2021-07-03: qty 1

## 2021-07-03 MED ORDER — ACETAMINOPHEN 325 MG PO TABS
650.0000 mg | ORAL_TABLET | Freq: Four times a day (QID) | ORAL | Status: DC
Start: 2021-07-03 — End: 2021-07-09
  Administered 2021-07-03 – 2021-07-09 (×23): 650 mg via ORAL
  Filled 2021-07-03 (×24): qty 2

## 2021-07-03 MED ORDER — ACETAMINOPHEN 650 MG RE SUPP
650.0000 mg | Freq: Four times a day (QID) | RECTAL | Status: DC
  Filled 2021-07-03: qty 1

## 2021-07-03 MED ORDER — FENTANYL CITRATE PF 50 MCG/ML IJ SOSY
50.0000 ug | PREFILLED_SYRINGE | INTRAMUSCULAR | Status: DC | PRN
  Administered 2021-07-03: 50 ug via INTRAVENOUS
  Filled 2021-07-03: qty 1

## 2021-07-03 MED ORDER — ONDANSETRON HCL 4 MG/2ML IJ SOLN
4.0000 mg | Freq: Once | INTRAMUSCULAR | Status: AC
  Administered 2021-07-03: 4 mg via INTRAVENOUS
  Filled 2021-07-03: qty 2

## 2021-07-03 MED ORDER — FENTANYL CITRATE PF 50 MCG/ML IJ SOSY
50.0000 ug | PREFILLED_SYRINGE | INTRAMUSCULAR | Status: DC | PRN
  Administered 2021-07-03 – 2021-07-04 (×4): 50 ug via INTRAVENOUS
  Filled 2021-07-03 (×4): qty 1

## 2021-07-03 NOTE — ED Triage Notes (Signed)
Pt tripped and fell. Slight shortening of the right leg with severe pain on movement. Pt also very confused. Only oriented to self and that he had a fall.

## 2021-07-03 NOTE — Assessment & Plan Note (Addendum)
Stable.  HD on TTS. -Nephrology following -RFP on HD days only -Avoid nephrotoxic agents -Midodrine 3 times weekly with HD

## 2021-07-03 NOTE — ED Provider Notes (Signed)
[facility] EMERGENCY DEPARTMENT Provider Note   CSN: [id number] Arrival date & time: 07/03/21  1556     History  Chief Complaint  Patient presents with   Blake West is a 71 y.o. male.  Pt is a 71y/o male with hx of diabetes, hypertension, coronary artery disease and CHF, hyperlipidemia, obesity, hypothyroidism. Currently completing dialysis Tuesday, Thursday, Saturday, chronic afib not on anticoagulation who is presenting today from home after a fall.  Patient's wife reports that he was walking and slipped falling onto his right side.  She did not note that he hit his head and patient denied as well but she reports since he fell he has seemed confused.  EMS reports when they arrived patient was complaining of severe pain in the right hip and he did receive 100 mcg of fentanyl but reports that even prior to the pain medication patient has not been his self.  He could not give his age but could answer other questions correctly.  However sometimes he just stares blankly when you ask him questions which his wife reports is very unusual.  He was not doing this prior to the fall.  Patient was seen by his doctor on 06/18/2021 and at that time had a productive cough and URI symptoms.  He had been started on Levaquin and it is unclear if that was improving.  Unsure if patient has had his normal medications this morning or when he last did dialysis.  We will have to wait for his wife for further information as patient is not able to provide this.  He does deny chest pain, shortness of breath, back pain, arm pain or abdominal pain.  He continually reports right hip pain.  The history is provided by the patient, medical records and the EMS personnel.  Fall       Home Medications Prior to Admission medications   Medication Sig Start Date End Date Taking? Authorizing Provider  acidophilus (RISAQUAD) CAPS capsule Take 2 capsules by mouth 3 (three) times daily. 04/30/21    Annita Brod, MD  Ascorbic Acid (VITAMIN C) 100 MG tablet Take 100 mg by mouth daily.    [provider]  calcium-vitamin D (OSCAL WITH D) 500-5 MG-MCG tablet Take 1 tablet by mouth.    [provider]  magnesium 30 MG tablet Take 30 mg by mouth 2 (two) times daily.    [provider]  metoprolol succinate (TOPROL-XL) 50 MG 24 hr tablet Take 1 tablet (50 mg total) by mouth daily. 05/18/21   Ezekiel Slocumb, DO  omega-3 acid ethyl esters (LOVAZA) 1 g capsule Take 1 g by mouth 2 (two) times daily.    [provider]  pantoprazole (PROTONIX) 40 MG tablet Take 1 tablet (40 mg total) by mouth 2 (two) times daily. 05/18/21   Ezekiel Slocumb, DO  predniSONE (DELTASONE) 20 MG tablet Take 3 tablets (60 mg total) by mouth daily with breakfast. 05/01/21   Annita Brod, MD  QUEtiapine (SEROQUEL) 100 MG tablet Take 1 tablet (100 mg total) by mouth at bedtime. 04/30/21   Annita Brod, MD  sulfamethoxazole-trimethoprim (BACTRIM DS) 800-160 MG tablet Take 1 tablet by mouth 3 (three) times a week. Patient not taking: Reported on 06/07/2021 05/03/21   Annita Brod, MD  thiamine 250 MG tablet Take 500 mg by mouth daily.    [provider]      Allergies    Codeine  Review of Systems   Review of Systems  Physical Exam Updated Vital Signs BP (!) 136/97 (BP Location: Left Arm)   Pulse (!) 146   Temp 98.4 F (36.9 C)   Resp 18   Ht 5\' 11"  (1.803 m)   Wt 90.7 kg   SpO2 96%   BMI 27.89 kg/m  Physical Exam Vitals and nursing note reviewed.  Constitutional:      General: He is not in acute distress.    Appearance: He is well-developed.  HENT:     Head: Normocephalic and atraumatic.     Mouth/Throat:     Mouth: Mucous membranes are moist.  Eyes:     Conjunctiva/sclera: Conjunctivae normal.     Pupils: Pupils are equal, round, and reactive to light.  Cardiovascular:     Rate and Rhythm: Normal rate and regular rhythm.     Heart  sounds: No murmur heard. Pulmonary:     Effort: Pulmonary effort is normal. No respiratory distress.     Breath sounds: Normal breath sounds. No wheezing or rales.     Comments: Dialysis cath present in the right upper chest Abdominal:     General: There is no distension.     Palpations: Abdomen is soft.     Tenderness: There is no abdominal tenderness. There is no guarding or rebound.  Musculoskeletal:        General: Tenderness present.     Cervical back: Normal range of motion and neck supple.     Right hip: Tenderness and bony tenderness present. Decreased range of motion.     Left hip: Tenderness present.     Right knee: Normal.     Left knee: Normal.     Right lower leg: No edema.     Left lower leg: No edema.     Right ankle: Normal.     Left ankle: Normal.  Skin:    General: Skin is warm and dry.     Findings: No erythema or rash.  Neurological:     Mental Status: He is alert.     Comments: Oriented to self but confused.  Noted to move all extremities without difficulty.  No facial droop.  Some difficulty following instructions  Psychiatric:        Behavior: Behavior normal.     ED Results / Procedures / Treatments   Labs (all labs ordered are listed, but only abnormal results are displayed) Labs Reviewed  CBC WITH DIFFERENTIAL/PLATELET - Abnormal; Notable for the following components:      Result Value   RBC 2.66 (*)    Hemoglobin 8.8 (*)    HCT 27.3 (*)    MCV 102.6 (*)    RDW 17.1 (*)    Platelets 145 (*)    nRBC 0.5 (*)    Abs Immature Granulocytes 0.30 (*)    All other components within normal limits  COMPREHENSIVE METABOLIC PANEL - Abnormal; Notable for the following components:   Chloride 97 (*)    Glucose, Bld 137 (*)    BUN 74 (*)    Creatinine, Ser 4.67 (*)    Calcium 8.1 (*)    Total Protein 5.7 (*)    Albumin 3.1 (*)    GFR, Estimated 13 (*)    Anion gap 17 (*)    All other components within normal limits  PROTIME-INR  AMMONIA  TYPE AND  SCREEN    EKG None  Radiology CT Head Wo Contrast  Result Date: 07/03/2021 CLINICAL DATA:  Fell status change of unknown cause. EXAM: CT HEAD WITHOUT CONTRAST CT CERVICAL SPINE WITHOUT CONTRAST TECHNIQUE: Multidetector CT imaging of the head and cervical spine was performed following the standard protocol without intravenous contrast. Multiplanar CT image reconstructions of the cervical spine were also generated. RADIATION DOSE REDUCTION: This exam was performed according to the departmental dose-optimization program which includes automated exposure control, adjustment of the mA and/or kV according to patient size and/or use of iterative reconstruction technique. COMPARISON:  09/17/2015 FINDINGS: CT HEAD FINDINGS Brain: No evidence of acute infarction, hemorrhage, hydrocephalus, extra-axial collection or mass lesion/mass effect. Mild ventricular sulcal enlargement reflecting age-appropriate volume loss. Mild periventricular white matter hypoattenuation is also present consistent with chronic microvascular ischemic change. Vascular: No hyperdense vessel or unexpected calcification. Skull: Normal. Negative for fracture or focal lesion. Sinuses/Orbits: Globes and orbits are unremarkable. Visualized sinuses are clear. Other: None. CT CERVICAL SPINE FINDINGS Alignment: Grade 1 anterolisthesis, C7 on T1 and C2 on C3, both degenerative. No other spondylolisthesis. Reversed cervical lordosis, apex at C3-C4. Skull base and vertebrae: No acute fracture. No primary bone lesion or focal pathologic process. Soft tissues and spinal canal: No prevertebral fluid or swelling. No visible canal hematoma. Disc levels: Mild to moderate loss of disc height at C2-C3. Marked loss of disc height at C3-C4, C4-C5, C5-C6 and C6-C7. Moderate loss of disc height at C7-T1. No evidence of a disc herniation. Upper chest: No acute findings. Other: None. IMPRESSION: HEAD CT 1. No acute intracranial abnormalities. 2. Age-appropriate volume  loss and mild chronic microvascular ischemic change. CERVICAL CT 1. No fracture or acute finding. 2. Degenerative changes as detailed. Electronically Signed   By: Lajean Manes M.D.   On: 07/03/2021 17:40   CT Cervical Spine Wo Contrast  Result Date: 07/03/2021 CLINICAL DATA:  [place] status change of unknown cause. EXAM: CT HEAD WITHOUT CONTRAST CT CERVICAL SPINE WITHOUT CONTRAST TECHNIQUE: Multidetector CT imaging of the head and cervical spine was performed following the standard protocol without intravenous contrast. Multiplanar CT image reconstructions of the cervical spine were also generated. RADIATION DOSE REDUCTION: This exam was performed according to the departmental dose-optimization program which includes automated exposure control, adjustment of the mA and/or kV according to patient size and/or use of iterative reconstruction technique. COMPARISON:  09/17/2015 FINDINGS: CT HEAD FINDINGS Brain: No evidence of acute infarction, hemorrhage, hydrocephalus, extra-axial collection or mass lesion/mass effect. Mild ventricular sulcal enlargement reflecting age-appropriate volume loss. Mild periventricular white matter hypoattenuation is also present consistent with chronic microvascular ischemic change. Vascular: No hyperdense vessel or unexpected calcification. Skull: Normal. Negative for fracture or focal lesion. Sinuses/Orbits: Globes and orbits are unremarkable. Visualized sinuses are clear. Other: None. CT CERVICAL SPINE FINDINGS Alignment: Grade 1 anterolisthesis, C7 on T1 and C2 on C3, both degenerative. No other spondylolisthesis. Reversed cervical lordosis, apex at C3-C4. Skull base and vertebrae: No acute fracture. No primary bone lesion or focal pathologic process. Soft tissues and spinal canal: No prevertebral fluid or swelling. No visible canal hematoma. Disc levels: Mild to moderate loss of disc height at C2-C3. Marked loss of disc height at C3-C4, C4-C5, C5-C6 and C6-C7. Moderate loss of disc  height at C7-T1. No evidence of a disc herniation. Upper chest: No acute findings. Other: None. IMPRESSION: HEAD CT 1. No acute intracranial abnormalities. 2. Age-appropriate volume loss and mild chronic microvascular ischemic change. CERVICAL CT 1. No fracture or acute finding. 2. Degenerative changes as detailed. Electronically Signed   By: Lajean Manes M.D.   On: 07/03/2021 17:40  DG Hip Unilat W or Wo Pelvis 2-3 Views Right  Result Date: 07/03/2021 CLINICAL DATA:  Trauma, fall EXAM: DG HIP (WITH OR WITHOUT PELVIS) 2-3V RIGHT COMPARISON:  None Available. FINDINGS: There is comminuted fracture in the neck of right femur. There is slight superior displacement of distal major fracture fragment. There is no dislocation. IMPRESSION: Comminuted, displaced fracture is seen in the neck of proximal right femur. Electronically Signed   By: Elmer Picker M.D.   On: 07/03/2021 16:41   DG Chest 1 View  Result Date: 07/03/2021 CLINICAL DATA:  Confusion and fall EXAM: CHEST  1 VIEW COMPARISON:  06/04/2021 FINDINGS: Cardiomegaly. Large-bore right neck multi lumen vascular catheter. Unchanged scarring and or atelectasis of the left lung base. The visualized skeletal structures are unremarkable. IMPRESSION: Cardiomegaly without acute abnormality of the lungs in AP portable projection. Electronically Signed   By: Delanna Ahmadi M.D.   On: 07/03/2021 16:41    Procedures Procedures    Medications Ordered in ED Medications  fentaNYL (SUBLIMAZE) injection 50 mcg (50 mcg Intravenous Given 07/03/21 1730)  ondansetron (ZOFRAN) injection 4 mg (4 mg Intravenous Given 07/03/21 1730)    ED Course/ Medical Decision Making/ A&P                           Medical Decision Making Amount and/or Complexity of Data Reviewed Independent Historian: EMS External Data Reviewed: notes.    Details: Recent doctor visit Labs: ordered. Decision-making details documented in ED Course. Radiology: ordered and independent  interpretation performed. Decision-making details documented in ED Course. ECG/medicine tests: ordered and independent interpretation performed. Decision-making details documented in ED Course.  Risk Prescription drug management. Decision regarding hospitalization.   Pt with multiple medical problems and comorbidities and presenting today with a complaint that caries a high risk for morbidity and mortality.  Presenting today after a fall with severe pain in the right hip and new confusion.  Per EMS patient was not confused prior to the fall.  However patient has been hospitalized recently and most recently saw his doctor on 26 May for URI symptoms and was started on Levaquin.  Confusion could be from possibly hitting his head versus related to the pain medication received for his hip pain.  Also concern for possible metabolic encephalopathy.  There was concern that patient may have pneumonia but currently oxygen saturation is 96% on room air.  Patient is found to be in atrial fibrillation with RVR today which seems to be an ongoing problem.  He does take 50 mg of metoprolol for this and when he was last seen by his doctor heart rate was in the 130s.  He denies any chest pain or shortness of breath at this time.  Also concern for electrolyte abnormality unclear when patient last had dialysis. Related to the fall concern for right hip fracture.  He is having some pain when moving the left hip as well and will get a pelvis to further evaluate.  Low suspicion for injury to the chest or abdomen.  5:47 PM On repeat evaluation patient is now awake and alert and back to his baseline. I have independently visualized and interpreted pt's images today.  Head CT without acute intracranial hemorrhage.  Hip and pelvic films show a right femoral neck fracture.  Radiology reports comminuted displaced fracture is seen in the proximal right femoral neck.  Chest x-ray without acute findings.  Radiology reports cervical  imaging is negative.  I independently  interpreted patient's EKG and labs today.  EKG with atrial fibrillation with RVR, CMP consistent with end-stage renal disease with normal potassium and sodium at this time, CBC with stable anemia with hemoglobin in the eights.  Family is now present also and patient reports he had dialysis today.  He has not had any new cough or congestion.  When discussing patient's heart rate he reports it is always high.  He did take his metoprolol today.  He is refusing any medication for rate control.  He drops as low as the 120s and he reports that is normal for him.  Spoke with Dr. Grayling Congress with orthopedic surgery and they will consult on the patient.  We will plan on admission to the hospitalist service for hip fracture.           Final Clinical Impression(s) / ED Diagnoses Final diagnoses:  Fall, initial encounter  Closed displaced fracture of right femoral neck (HCC)  Atrial fibrillation with rapid ventricular response ([place])  End stage renal disease on dialysis [facility])    Rx / DC Orders ED Discharge Orders     None         Blanchie Dessert, MD 07/03/21 1747

## 2021-07-03 NOTE — Assessment & Plan Note (Addendum)
Currently on Prednisone daily and Bactrim 3 times weekly for PCP ppx.  -continue prednisone taper to start 20mg  daily today -Continue Bactrim MWF

## 2021-07-03 NOTE — Assessment & Plan Note (Deleted)
Adequately controlled ranging 90s-110s/70s-80s. - continue metoprolol tartrate 37.5mg  BID

## 2021-07-03 NOTE — Significant Event (Signed)
Mr. Venhuizen presents with a right displaced femoral neck fracture after a fall.  At this time he does have a displaced femoral fracture that I do believe would benefit from arthroplasty.  To that effect I have asked Dr. Delila Spence to consult on his case in preparation for an arthroplasty.  We will plan for operating room on Monday.  We will plan for medical admission at this time and medical optimization.  Pending full consult with Dr. Clayborne Dana.

## 2021-07-03 NOTE — Assessment & Plan Note (Deleted)
States that he is unable to tolerate CPAP. On 4L [place] currently with normal saturations. -Wean oxygen  -Monitor saturations overnight, may need assistance with [place] to keep sats >92%

## 2021-07-03 NOTE — Hospital Course (Addendum)
Blake West is a 71 y.o.male with a history of chronic A-fib with RVR, ESRD on HD, HTN, ANCA associated vasculitis, CAD, CHF, OSA on CPAP, GI ulcers, T2DM who was admitted to the [facility] Teaching Service at [facility] for right femoral neck fracture 2/2 fall. His hospital course is detailed below:  Displaced fracture right femoral neck Presented after slip and fall with imaging notable for displaced and comminuted fracture to the neck of proximal right femur.  Orthopedic surgery consulted and performed right hip hemiarthroplasty.  At the time of discharge, pain was well controlled and was started on Os-Cal and vitamin D supplementation.  Chronic A-fib with RVR Patient remained off anticoagulation given recent GI bleeding and anemia which required 9 units PRBC transfusion during previous hospitalization.  CBC was trended throughout hospitalization and patient did not require transfusion.  Cardiology was consulted for management of A-fib with RVR and slowly increased metoprolol to finally metoprolol tartrate 37.5 mg twice daily.  Discharged with recommendations to continue metoprolol tartrate 37.5 mg twice daily, amiodarone 400 mg twice daily x7 days and then amiodarone 200 mg daily afterwards.  Coronary artery disease Patient was started on atorvastatin 80 mg daily, per cardiology.  Subclinical hypothyroidism Patient discontinued Synthroid in the past due to poor reaction (it is unknown what this poor reaction is).  Family is opted not to restart it.  Other chronic conditions were medically managed with home medications and formulary alternatives as necessary (T2DM, ANCA associated vasculitis, ESRD, OSA)  PCP Follow-up Recommendations: Recheck lipids and liver function in 8 weeks Consider initiation of bisphosphonate Ensure Amiodarone 200mg  daily

## 2021-07-03 NOTE — Assessment & Plan Note (Deleted)
Last A1c 5.9 in March. Next A1c due in September. Diet controlled.  -Check gluc on RFP -Hold SSI  -Lipid panel in the AM -Recommend high-intensity statin

## 2021-07-03 NOTE — Assessment & Plan Note (Deleted)
Mild, chronic, stable.  Likely has some degree of MDS.

## 2021-07-03 NOTE — Assessment & Plan Note (Addendum)
Generally remains in A-fib with RVR, asymptomatic. HR continues to be around 110s. Patient has declined aggressive rate control in the past.  Not on anticoagulation due to history of GI bleed.  Cardiology continues to work towards improving A-fib. -Cardiology following, appreciate recommendations -tele monitoring -Increase metoprolol tartrate to 37.5 mg twice daily -Amiodarone 400mg  BID x 7 days then followed by 200mg  daily -hold anticoagulation

## 2021-07-03 NOTE — Assessment & Plan Note (Addendum)
Hgb stable at 9.4. Given stability for several days, will NOT continue to monitor via labs. -Monitor for signs/symptoms of bleeding. -Continue Protonix 40 mg BID for chronic ulcers -Continue B vitamin supplementation

## 2021-07-03 NOTE — Assessment & Plan Note (Addendum)
Now s/p R hip hemiarthroplasty. Pain adequately controlled. Has minimally required PRN pain medications. Stable for SNF placement. -Ortho following, appreciate recommendations -PT/OT eval and treat Cefadroxil 500mg  BID x7 days, per orthopedics, today is day 4/7 -Pain control with Tylenol 650 mg q6h, Oxy 2.5 mg q4h PRN, Fentanyl 50 mcg q2h for breakthrough pain -Delirium precautions (hx of this on prior admissions) -Continue Os-cal, Vitamin D supplementation, added 1000u Vit D -Consider starting bisphosphate prior to d/c

## 2021-07-03 NOTE — H&P (Addendum)
Hospital Admission History and Physical Service Pager: [phone]  Patient name: Blake West Medical record number: [id number] Date of Birth: 1950/06/14 Age: 71 y.o. Gender: male  Primary Care Provider: Leonel Ramsay, MD Consultants: Ortho Code Status: Partial code - does not want intubation Preferred Emergency Contact:   Name Relation Home Work Mobile   Milham,Susie Spouse [phone]  [phone]  [phone] (patient's phone, wife has his phone and hers is not working)  Can also contact daughter Driscilla Grammes if wife unable to answer: [phone]  Chief Complaint: Fall  Assessment and Plan: RUSHI CHASEN is a 71 y.o. male presenting with right femoral neck fracture s/p fall. Admitted for medical optimization prior to ortho repair on 6/12 given his chronic medical conditions (see below).   * Displaced fracture of right femoral neck (HCC) s/p slip and fall. Displaced and comminuted fx to neck of proximal right femur. Ortho consulted in ED.  -Admit to FPTS, med-tele, attending Dr McDiarmid -Ortho consulted, appreciate recommendations -Tentatively planning for R hip arthroplasty on Monday 6/12 -Pain control with Tylenol 650 mg q6h, Oxy 2.5 mg q6h, 50 mcg fentanyl q2h for breakthrough pain -Will need PT/OT post operatively -Strict bed rest -Delirium precautions (hx of this on prior admissions) -Continue Os-cal, Vitamin D supplementation  -Consider starting bisphosphate prior to d/c   ESRD (end stage renal disease) on dialysis ([place]) Stable. CMP with slight hyperchloremic acidosis, AG 17. Received his normally scheduled HD today. Has HD cath to right IJ.  -Will need to contact Nephro consulted for HD management while admitted. Next HD session 6/13 -Daily renal function panel -Avoid nephrotoxic agents -Midodrine 3 times weekly with HD   ANCA-associated vasculitis (HCC) Currently on Prednisone daily and Bactrim 3 times weekly for PCP ppx.  -Will need to confirm prednisone  dosing 6/11 as patient is on taper -Continue Bactrim, next dose 6/12  Chronic atrial fibrillation with RVR ([place]) Patient chronically in a-fib with RVR. HR 130s currently. Off of anti-coagulation due to recent GI bleed (had Hgb 4 and required 9U blood transfusion during admission). Asymptomatic at this time. Reports not doing well with rate-control attempts in the past and would prefer to not have rate-controlled at this time. -tele monitoring -continue home metoprolol 50mg  daily -hold anticoagulation  -will plan for cardiology consult tomorrow, 6/11, for eval prior to OR 6/12   Essential hypertension Only on 50 mg Toprol-XL daily. Elevated but asymptomatic. Could be elevated in the setting of pain.  -Continue Toprol-XL  -Vitals per floor routine  Thrombocytopenia (HCC) Platelets 145k. Chronically low but stable, baseline appears to be around 130-140.   Macrocytic anemia Hemoglobin 8.8, MCV 102.6. Recently hospitalized for ABLA from duodenal ulcer bleed. Required 9U PRBC transfusion due to Hgb 4 at that time. Off of anti-coagulation. Denies any active bleeding. Transfusion threshold of 8.  -CBC daily -Monitor for signs/symptoms of bleeding. -Continue Protonix 40 mg BID  -Continue B vitamin supplementation  Type 2 diabetes mellitus (HCC) Last A1c 5.9 in March. Next A1c due in September. Diet controlled.  -Check gluc on RFP -Hold SSI  -Lipid panel in the AM -Recommend high-intensity statin   OSA (obstructive sleep apnea) States that he is unable to tolerate CPAP. On 1.5L [place] currently with normal saturations. -Wean oxygen  -Monitor saturations overnight, may need assistance with [place] to keep sats >92%   FEN/GI: renal diet  VTE Prophylaxis: SCD on left leg   Disposition: med-tele  History of Present Illness:  Blake West is a  71 y.o. male presenting with right hip fracture s/p fall.  Patient was trying to move to another room and "crashed". Per daughter, there is a slight  downgrade in a portion of the house and his walker gave away. He has also been having issues with his right knee "locking up" and fell forward and onto his right hip. He reports that his pain is currently 8/10. He was supposed to have "fluid pulled off" of his right knee yesterday but was in too much pain to go.  Lives with his wife, he was on the ground for a while and needed assistance with getting up. Daughter and a friend EMT went to check on patient then called for ambulance as they needed more assistance.  Patient notes that he has had afib with RVR for years and is asymptomatic for the most part, he does note that he has a bit more dyspnea with ambulation though.   Patient received all of his morning medications today.   Doesn't have oxygen at baseline, but was put on 1.5L most recently with the fentanyl dosing.   Has had issues with sundowning in the hospital previously and required sitters in the past.   In the ED, received 1 dose of fentanyl 50 mcg. Hip x-ray with comminuted displaced fracture seen in the neck of the proximal right femur.  Chest x-ray notable for cardiomegaly without any acute abnormalities in the lungs.  Head and C-spine CT unremarkable.   Review Of Systems: Per HPI with the following additions:  Positive for right hip pain  Denies: Syncope, abdominal pain, constipation, lightheadedness, weakness, dizziness, nausea, vomiting, hematochezia, palpitations, chest pain, shortness of breath.   Pertinent Past Medical History: Type 2 diabetes, diet controlled OSA on CPAP Congestive heart failure Coronary artery disease Recent GI bleed Remainder reviewed in history tab.   Pertinent Past Surgical History: Denies   Remainder reviewed in history tab.   Pertinent Social History: Tobacco use: Former Alcohol use: None Other Substance use: None Lives with wife  Pertinent Family History: Significant cardiac disease in mother (now deceased) and siblings Remainder  reviewed in history tab.   Important Outpatient Medications:  Current Facility-Administered Medications for the 07/03/21 encounter [facility] Encounter)  Medication   ceFAZolin (ANCEF) 1 g in dextrose 5 % 100 mL IVPB   Current Meds  Medication Sig   acetaminophen (TYLENOL) 325 MG tablet Take 650 mg by mouth every 6 (six) hours as needed for moderate pain.   acidophilus (RISAQUAD) CAPS capsule Take 2 capsules by mouth 3 (three) times daily.   Ascorbic Acid (VITAMIN C) 100 MG tablet Take 100 mg by mouth daily.   B Complex Vitamins (B COMPLEX PO) Take 1 capsule by mouth daily.   calcium-vitamin D (OSCAL WITH D) 500-5 MG-MCG tablet Take 1 tablet by mouth.   magnesium 30 MG tablet Take 30 mg by mouth 2 (two) times daily.   metoprolol succinate (TOPROL-XL) 50 MG 24 hr tablet Take 1 tablet (50 mg total) by mouth daily.   midodrine (PROAMATINE) 10 MG tablet Take 10 mg by mouth 3 (three) times a week. Tuesday, Thursday, and Saturday   omega-3 acid ethyl esters (LOVAZA) 1 g capsule Take 1 g by mouth 2 (two) times daily.   pantoprazole (PROTONIX) 40 MG tablet Take 1 tablet (40 mg total) by mouth 2 (two) times daily.   predniSONE (DELTASONE) 20 MG tablet Take 3 tablets (60 mg total) by mouth daily with breakfast. (Patient taking differently: Take 60 mg by mouth  daily with breakfast. Titrate down 1 tablet weekly till taking 1 tablet (20 mg) by mouth daily.)   Pyridoxine HCl (VITAMIN B6 PO) Take 1 capsule by mouth daily.   QUEtiapine (SEROQUEL) 100 MG tablet Take 1 tablet (100 mg total) by mouth at bedtime.   sulfamethoxazole-trimethoprim (BACTRIM DS) 800-160 MG tablet Take 1 tablet by mouth 3 (three) times a week.   thiamine 250 MG tablet Take 500 mg by mouth daily.   VITAMIN E PO Take 1 Capful by mouth daily.    Remainder reviewed in medication history.   Objective: BP 122/87   Pulse (!) 134   Temp 98.4 F (36.9 C)   Resp 19   Ht 5\' 11"  (1.803 m)   Wt 90.7 kg   SpO2 96%   BMI 27.89 kg/m   Exam: General: caucasian elderly male laying in bed, in mild discomfort, cooperative with examination.  Eyes: EOMI ENTM: Conjunctival pallor, dry mucous membranes  Neck: supple Cardiovascular: Tachycardic, irregularly irregular rhythm, 2+ pitting in BLE to knees, 2+ DP and PT pulses b/l Respiratory: CTAB in anterior fields Gastrointestinal: Normoactive bowel sounds, slight discomfort on palpation to lower quadrants without R/G MSK: Right leg externally rotated, pain to palpation or right hip, no ecchymosis or edema to right hip.  Derm: warm and dry  Neuro: Awake, alert, and oriented to person, place, situation but not time (states 2003). Follows commands. Lack of sensation on b/l lower legs equally Psych: Appropriate mood and affect  Labs:  CBC BMET  Recent Labs  Lab 07/03/21 1620  WBC 7.3  HGB 8.8*  HCT 27.3*  PLT 145*   Recent Labs  Lab 07/03/21 1620  NA 138  K 4.7  CL 97*  CO2 24  BUN 74*  CREATININE 4.67*  GLUCOSE 137*  CALCIUM 8.1*    Ammonia: 11 PT 13.6, INR 1.1  EKG: A-fib with RVR, rate 143bpm.  T wave inversions in leads II, III, V3, V5, V6. Prolonged QT 506  Imaging Studies Performed:  Imaging Study CT Head Wo Contrast  Result Date: 07/03/2021 CLINICAL DATA:  [place] status change of unknown cause. EXAM: CT HEAD WITHOUT CONTRAST CT CERVICAL SPINE WITHOUT CONTRAST TECHNIQUE: Multidetector CT imaging of the head and cervical spine was performed following the standard protocol without intravenous contrast. Multiplanar CT image reconstructions of the cervical spine were also generated. RADIATION DOSE REDUCTION: This exam was performed according to the departmental dose-optimization program which includes automated exposure control, adjustment of the mA and/or kV according to patient size and/or use of iterative reconstruction technique. COMPARISON:  09/17/2015 FINDINGS: CT HEAD FINDINGS Brain: No evidence of acute infarction, hemorrhage, hydrocephalus, extra-axial  collection or mass lesion/mass effect. Mild ventricular sulcal enlargement reflecting age-appropriate volume loss. Mild periventricular white matter hypoattenuation is also present consistent with chronic microvascular ischemic change. Vascular: No hyperdense vessel or unexpected calcification. Skull: Normal. Negative for fracture or focal lesion. Sinuses/Orbits: Globes and orbits are unremarkable. Visualized sinuses are clear. Other: None. CT CERVICAL SPINE FINDINGS Alignment: Grade 1 anterolisthesis, C7 on T1 and C2 on C3, both degenerative. No other spondylolisthesis. Reversed cervical lordosis, apex at C3-C4. Skull base and vertebrae: No acute fracture. No primary bone lesion or focal pathologic process. Soft tissues and spinal canal: No prevertebral fluid or swelling. No visible canal hematoma. Disc levels: Mild to moderate loss of disc height at C2-C3. Marked loss of disc height at C3-C4, C4-C5, C5-C6 and C6-C7. Moderate loss of disc height at C7-T1. No evidence of a disc herniation. Upper  chest: No acute findings. Other: None. IMPRESSION: HEAD CT 1. No acute intracranial abnormalities. 2. Age-appropriate volume loss and mild chronic microvascular ischemic change. CERVICAL CT 1. No fracture or acute finding. 2. Degenerative changes as detailed. Electronically Signed   By: Lajean Manes M.D.   On: 07/03/2021 17:40   CT Cervical Spine Wo Contrast  Result Date: 07/03/2021 CLINICAL DATA:  [place] status change of unknown cause. EXAM: CT HEAD WITHOUT CONTRAST CT CERVICAL SPINE WITHOUT CONTRAST TECHNIQUE: Multidetector CT imaging of the head and cervical spine was performed following the standard protocol without intravenous contrast. Multiplanar CT image reconstructions of the cervical spine were also generated. RADIATION DOSE REDUCTION: This exam was performed according to the departmental dose-optimization program which includes automated exposure control, adjustment of the mA and/or kV according to patient size  and/or use of iterative reconstruction technique. COMPARISON:  09/17/2015 FINDINGS: CT HEAD FINDINGS Brain: No evidence of acute infarction, hemorrhage, hydrocephalus, extra-axial collection or mass lesion/mass effect. Mild ventricular sulcal enlargement reflecting age-appropriate volume loss. Mild periventricular white matter hypoattenuation is also present consistent with chronic microvascular ischemic change. Vascular: No hyperdense vessel or unexpected calcification. Skull: Normal. Negative for fracture or focal lesion. Sinuses/Orbits: Globes and orbits are unremarkable. Visualized sinuses are clear. Other: None. CT CERVICAL SPINE FINDINGS Alignment: Grade 1 anterolisthesis, C7 on T1 and C2 on C3, both degenerative. No other spondylolisthesis. Reversed cervical lordosis, apex at C3-C4. Skull base and vertebrae: No acute fracture. No primary bone lesion or focal pathologic process. Soft tissues and spinal canal: No prevertebral fluid or swelling. No visible canal hematoma. Disc levels: Mild to moderate loss of disc height at C2-C3. Marked loss of disc height at C3-C4, C4-C5, C5-C6 and C6-C7. Moderate loss of disc height at C7-T1. No evidence of a disc herniation. Upper chest: No acute findings. Other: None. IMPRESSION: HEAD CT 1. No acute intracranial abnormalities. 2. Age-appropriate volume loss and mild chronic microvascular ischemic change. CERVICAL CT 1. No fracture or acute finding. 2. Degenerative changes as detailed. Electronically Signed   By: Lajean Manes M.D.   On: 07/03/2021 17:40   DG Hip Unilat W or Wo Pelvis 2-3 Views Right  Result Date: 07/03/2021 CLINICAL DATA:  Trauma, fall EXAM: DG HIP (WITH OR WITHOUT PELVIS) 2-3V RIGHT COMPARISON:  None Available. FINDINGS: There is comminuted fracture in the neck of right femur. There is slight superior displacement of distal major fracture fragment. There is no dislocation. IMPRESSION: Comminuted, displaced fracture is seen in the neck of proximal right  femur. Electronically Signed   By: Elmer Picker M.D.   On: 07/03/2021 16:41   DG Chest 1 View  Result Date: 07/03/2021 CLINICAL DATA:  Confusion and fall EXAM: CHEST  1 VIEW COMPARISON:  06/04/2021 FINDINGS: Cardiomegaly. Large-bore right neck multi lumen vascular catheter. Unchanged scarring and or atelectasis of the left lung base. The visualized skeletal structures are unremarkable. IMPRESSION: Cardiomegaly without acute abnormality of the lungs in AP portable projection. Electronically Signed   By: Delanna Ahmadi M.D.   On: 07/03/2021 16:41     Sharion Settler, DO 07/03/2021, 9:30 PM PGY-2, [place] Intern pager: [phone], text pages welcome Secure chat group [place]

## 2021-07-04 ENCOUNTER — Inpatient Hospital Stay (HOSPITAL_COMMUNITY): Payer: PPO | Admitting: Anesthesiology

## 2021-07-04 ENCOUNTER — Encounter (HOSPITAL_COMMUNITY): Payer: Self-pay | Admitting: Family Medicine

## 2021-07-04 DIAGNOSIS — I428 Other cardiomyopathies: Secondary | ICD-10-CM

## 2021-07-04 DIAGNOSIS — I482 Chronic atrial fibrillation, unspecified: Secondary | ICD-10-CM

## 2021-07-04 DIAGNOSIS — Z01810 Encounter for preprocedural cardiovascular examination: Secondary | ICD-10-CM

## 2021-07-04 DIAGNOSIS — S72001A Fracture of unspecified part of neck of right femur, initial encounter for closed fracture: Secondary | ICD-10-CM | POA: Diagnosis not present

## 2021-07-04 DIAGNOSIS — D696 Thrombocytopenia, unspecified: Secondary | ICD-10-CM

## 2021-07-04 DIAGNOSIS — R410 Disorientation, unspecified: Secondary | ICD-10-CM | POA: Insufficient documentation

## 2021-07-04 DIAGNOSIS — W19XXXA Unspecified fall, initial encounter: Secondary | ICD-10-CM

## 2021-07-04 DIAGNOSIS — I5022 Chronic systolic (congestive) heart failure: Secondary | ICD-10-CM

## 2021-07-04 DIAGNOSIS — Z992 Dependence on renal dialysis: Secondary | ICD-10-CM

## 2021-07-04 DIAGNOSIS — E1159 Type 2 diabetes mellitus with other circulatory complications: Secondary | ICD-10-CM

## 2021-07-04 DIAGNOSIS — D539 Nutritional anemia, unspecified: Secondary | ICD-10-CM

## 2021-07-04 DIAGNOSIS — N186 End stage renal disease: Secondary | ICD-10-CM

## 2021-07-04 DIAGNOSIS — I7782 Antineutrophilic cytoplasmic antibody (ANCA) vasculitis: Secondary | ICD-10-CM

## 2021-07-04 DIAGNOSIS — G4733 Obstructive sleep apnea (adult) (pediatric): Secondary | ICD-10-CM

## 2021-07-04 LAB — CBC
HCT: 27.1 % — ABNORMAL LOW (ref 39.0–52.0)
Hemoglobin: 8.9 g/dL — ABNORMAL LOW (ref 13.0–17.0)
MCH: 33.6 pg (ref 26.0–34.0)
MCHC: 32.8 g/dL (ref 30.0–36.0)
MCV: 102.3 fL — ABNORMAL HIGH (ref 80.0–100.0)
Platelets: 133 10*3/uL — ABNORMAL LOW (ref 150–400)
RBC: 2.65 MIL/uL — ABNORMAL LOW (ref 4.22–5.81)
RDW: 17.1 % — ABNORMAL HIGH (ref 11.5–15.5)
WBC: 7.4 10*3/uL (ref 4.0–10.5)
nRBC: 1.1 % — ABNORMAL HIGH (ref 0.0–0.2)

## 2021-07-04 LAB — LIPID PANEL
Cholesterol: 200 mg/dL (ref 0–200)
HDL: 37 mg/dL — ABNORMAL LOW (ref >40)
LDL Cholesterol: 124 mg/dL — ABNORMAL HIGH (ref 0–99)
Total CHOL/HDL Ratio: 5.4 RATIO
Triglycerides: 197 mg/dL — ABNORMAL HIGH (ref <150)
VLDL: 39 mg/dL (ref 0–40)

## 2021-07-04 LAB — RENAL FUNCTION PANEL
Albumin: 3 g/dL — ABNORMAL LOW (ref 3.5–5.0)
Anion gap: 15 (ref 5–15)
BUN: 80 mg/dL — ABNORMAL HIGH (ref 8–23)
CO2: 25 mmol/L (ref 22–32)
Calcium: 8.2 mg/dL — ABNORMAL LOW (ref 8.9–10.3)
Chloride: 98 mmol/L (ref 98–111)
Creatinine, Ser: 5.36 mg/dL — ABNORMAL HIGH (ref 0.61–1.24)
GFR, Estimated: 11 mL/min — ABNORMAL LOW (ref >60)
Glucose, Bld: 117 mg/dL — ABNORMAL HIGH (ref 70–99)
Phosphorus: 7.1 mg/dL — ABNORMAL HIGH (ref 2.5–4.6)
Potassium: 5.1 mmol/L (ref 3.5–5.1)
Sodium: 138 mmol/L (ref 135–145)

## 2021-07-04 LAB — BASIC METABOLIC PANEL
Anion gap: 16 — ABNORMAL HIGH (ref 5–15)
BUN: 92 mg/dL — ABNORMAL HIGH (ref 8–23)
CO2: 23 mmol/L (ref 22–32)
Calcium: 8.3 mg/dL — ABNORMAL LOW (ref 8.9–10.3)
Chloride: 96 mmol/L — ABNORMAL LOW (ref 98–111)
Creatinine, Ser: 6.05 mg/dL — ABNORMAL HIGH (ref 0.61–1.24)
GFR, Estimated: 9 mL/min — ABNORMAL LOW (ref >60)
Glucose, Bld: 157 mg/dL — ABNORMAL HIGH (ref 70–99)
Potassium: 5.2 mmol/L — ABNORMAL HIGH (ref 3.5–5.1)
Sodium: 135 mmol/L (ref 135–145)

## 2021-07-04 LAB — MAGNESIUM: Magnesium: 2.1 mg/dL (ref 1.7–2.4)

## 2021-07-04 LAB — TSH: TSH: 12.911 u[IU]/mL — ABNORMAL HIGH (ref 0.350–4.500)

## 2021-07-04 MED ORDER — CHLORHEXIDINE GLUCONATE CLOTH 2 % EX PADS
6.0000 | MEDICATED_PAD | Freq: Every day | CUTANEOUS | Status: DC
  Administered 2021-07-06 – 2021-07-07 (×2): 6 via TOPICAL

## 2021-07-04 MED ORDER — PREDNISONE 20 MG PO TABS
30.0000 mg | ORAL_TABLET | Freq: Every day | ORAL | Status: DC
  Administered 2021-07-04 – 2021-07-07 (×3): 30 mg via ORAL
  Filled 2021-07-04 (×3): qty 1

## 2021-07-04 MED ORDER — ROPIVACAINE HCL 5 MG/ML IJ SOLN
INTRAMUSCULAR | Status: DC | PRN
  Administered 2021-07-04: 30 mL

## 2021-07-04 MED ORDER — PREDNISONE 20 MG PO TABS
20.0000 mg | ORAL_TABLET | Freq: Every day | ORAL | Status: DC
  Administered 2021-07-09: 20 mg via ORAL
  Filled 2021-07-04 (×2): qty 1

## 2021-07-04 MED ORDER — FENTANYL CITRATE (PF) 100 MCG/2ML IJ SOLN
INTRAMUSCULAR | Status: AC
  Administered 2021-07-04: 50 ug
  Filled 2021-07-04: qty 2

## 2021-07-04 MED ORDER — METOPROLOL TARTRATE 25 MG PO TABS
25.0000 mg | ORAL_TABLET | Freq: Four times a day (QID) | ORAL | Status: DC
  Administered 2021-07-04 – 2021-07-06 (×7): 25 mg via ORAL
  Filled 2021-07-04 (×7): qty 1

## 2021-07-04 MED ORDER — SODIUM ZIRCONIUM CYCLOSILICATE 10 G PO PACK
10.0000 g | PACK | Freq: Every day | ORAL | Status: DC
  Administered 2021-07-04 – 2021-07-07 (×4): 10 g via ORAL
  Filled 2021-07-04 (×4): qty 1

## 2021-07-04 NOTE — Progress Notes (Signed)
Daily Progress Note Intern Pager: [phone]  Patient name: Blake West Medical record number: [id number] Date of birth: 01/18/1951 Age: 71 y.o. Gender: male  Primary Care Provider: Leonel Ramsay, MD Consultants: cardiology, nephrology   Code Status: Partial Code, DNI  Pt Overview and Major Events to Date:  6/10 admitted for right femoral neck fracture  Assessment and Plan: 71 year old male presenting with right femoral neck fracture after a fall with plan for arthroplasty 6/12. [place] Kitchen Pertinent PMH/PSH includes ESRD on HD, ANCA associated vasculitis, CAD, HFmrEF, NICM, chronic atrial fibrillation with RVR, HTN, thrombocytopenia, diet controlled T2DM, OSA, hypothyroidism.   * Displaced fracture of right femoral neck (HCC) Pain adequately controlled.  Planning for right hip arthroplasty 6/12. -Ortho consulted, appreciate recommendations -cardiology consulted for pre-op evaluation -Pain control with Tylenol 650 mg q6h, Oxy 2.5 mg q6h, 50 mcg fentanyl q2h for breakthrough pain -Will need PT/OT post operatively -Strict bed rest -Delirium precautions (hx of this on prior admissions) -Continue Os-cal, Vitamin D supplementation  -Consider starting bisphosphate prior to d/c   ESRD (end stage renal disease) on dialysis ([place]) Stable -Nephrology consulted for continuation of HD -Daily renal function panel -Avoid nephrotoxic agents -Midodrine 3 times weekly with HD   ANCA-associated vasculitis (HCC) Currently on Prednisone daily and Bactrim 3 times weekly for PCP ppx.  -continue prednisone taper 30 mg daily, then 20 mg daily on 6/15 -Continue Bactrim, next dose 6/12  Chronic atrial fibrillation with RVR ([place]) Remains in A-fib with RVR, asymptomatic.  Patient has declined aggressive rate control in the past.  Not on anticoagulation due to history of GI bleed. -Cardiology consulted, appreciate recommendations -tele monitoring -continue home metoprolol succinate 50mg  daily -hold  anticoagulation    Heart failure with mid-range ejection fraction (HCC) Recent TTE showing EF 40 to 45%.  Appears mildly volume overloaded on exam with pitting edema.  Will defer need for diuresis to cardiology.  Hypertension associated with diabetes ([place]) Adequately controlled - continue metoprolol succinate 50 mg daily  Thrombocytopenia (HCC) Mild, chronic, stable.  Likely has some degree of MDS.  Macrocytic anemia Stable -CBC daily, transfusion for hemoglobin less than 8 -Monitor for signs/symptoms of bleeding. -Continue Protonix 40 mg BID  -Continue B vitamin supplementation  Type 2 diabetes mellitus ([place]) Diet controlled, monitor on labs.  OSA (obstructive sleep apnea) States that he is unable to tolerate CPAP. On 1.5L [place] currently with normal saturations. -Wean oxygen  -Monitor saturations overnight, may need assistance with [place] to keep sats >92%       FEN/GI: Renal diet, n.p.o. at midnight PPx: SCDs given bleed risk Dispo:Pending PT recommendations  in 2-3 days. Barriers include surgical intervention.   Subjective:  No overnight events.  Patient reports that he still has significant pain in his right hip.  He does not want to increase his oxycodone dose.  He is unsure of his prednisone dose.  Spoke with spouse Cindy Brindisi on the phone.  Confirmed that he is taking prednisone 30 mg currently and should be weaning to 20 mg on Thursday, 6/15.  Objective: Temp:  [98 F (36.7 C)-98.7 F (37.1 C)] 98.7 F (37.1 C) (06/11 1324) Pulse Rate:  [67-157] 148 (06/11 1324) Resp:  [12-22] 20 (06/11 1324) BP: (98-155)/(70-99) 112/79 (06/11 1324) SpO2:  [90 %-100 %] 96 % (06/11 1324) Weight:  [90.7 kg-95.4 kg] 95.4 kg (06/11 0330) Physical Exam: General: Alert, NAD Cardiovascular: Irregularly irregular, tachycardic Respiratory: Clear to auscultation bilaterally, breathing comfortably on room air Abdomen: Soft,  mild diffuse tenderness Extremities: Warm and well perfused,  2-3+ pitting edema bilaterally  Laboratory: Most recent CBC Lab Results  Component Value Date   WBC 7.4 07/04/2021   HGB 8.9 (L) 07/04/2021   HCT 27.1 (L) 07/04/2021   MCV 102.3 (H) 07/04/2021   PLT 133 (L) 07/04/2021   Most recent BMP    Latest Ref Rng & Units 07/04/2021    5:53 AM  BMP  Glucose 70 - 99 mg/dL 117   BUN 8 - 23 mg/dL 80   Creatinine 0.61 - 1.24 mg/dL 5.36   Sodium 135 - 145 mmol/L 138   Potassium 3.5 - 5.1 mmol/L 5.1   Chloride 98 - 111 mmol/L 98   CO2 22 - 32 mmol/L 25   Calcium 8.9 - 10.3 mg/dL 8.2     Other pertinent labs none  Imaging/Diagnostic Tests: None today Zola Button, MD 07/04/2021, 1:31 PM  PGY-2, [place] Intern pager: [phone], text pages welcome Secure chat group [place]

## 2021-07-04 NOTE — Anesthesia Pain Management Evaluation Note (Signed)
Anesthesia Pain Consult Note  Patient: Blake West, 71 y.o., male  Consult Requested by: McDiarmid, Blane Ohara, MD   Reason for Consult: hip fracture pain  Level of Consciousness: alert  Pain: moderate   Last Vitals:  Vitals:   07/04/21 1700 07/04/21 1726  BP: (!) 118/93 (!) 127/94  Pulse: (!) 129 (!) 132  Resp: 13 17  Temp: 37.1 C   SpO2: 97% 97%    Plan: Peripheral nerve block for pain control    Consent:Risks of procedure as well as the alternatives and risks of each were explained to the (patient/caregiver).  Consent for procedure obtained. Explained to patient and his wife.  Advance Directive:Patient named surrogate decision maker / provided an advance care plan.  Allergies  Allergen Reactions  . Codeine Other (See Comments)    Joints hurt  Other reaction(s): Other (See Comments) Joints hurt  Other reaction(s): Other (See Comments) Joints hurt  Joints hurt    . Citalopram Other (See Comments)    agitation  . Gabapentin Other (See Comments)    agitation  . Lyrica [Pregabalin] Other (See Comments)    agitation    Physical exam: PULM clear to auscultation  CARDIO Rhythm: rapid rate  OTHER    I have reviewed the patient's medications listed below. Marland Kitchen acetaminophen  650 mg Oral Q6H   Or  . acetaminophen  650 mg Rectal Q6H  . calcium-vitamin D  1 tablet Oral Q breakfast  . metoprolol tartrate  25 mg Oral Q6H  . [START ON 07/05/2021] midodrine  10 mg Oral Once per day on Mon Wed Fri  . omega-3 acid ethyl esters  1 g Oral BID  . oxyCODONE  2.5 mg Oral Q6H  . pantoprazole  40 mg Oral BID  . [START ON 07/08/2021] predniSONE  20 mg Oral Q breakfast  . predniSONE  30 mg Oral Q breakfast  . QUEtiapine  100 mg Oral QHS  . sodium zirconium cyclosilicate  10 g Oral Daily  . [START ON 07/05/2021] sulfamethoxazole-trimethoprim  1 tablet Oral Once per day on Mon Wed Fri  . vitamin C  125 mg Oral Daily    fentaNYL (SUBLIMAZE) injection, polyethylene glycol  Past  Medical History:  Diagnosis Date  . Abnormal EKG   . Acute blood loss anemia (ABLA) 05/16/2021  . Acute congestive heart failure ([place]) 04/25/2019  . Acute hypoxemic respiratory failure ([place]) 04/23/2021  . Acute renal failure superimposed on stage 4 chronic kidney disease ([place]) 04/19/2021  . Acute urinary retention 04/25/2021  . Biventricular failure ([place])   . CAD (coronary artery disease)   . Class 2 severe obesity due to excess calories with serious comorbidity and body mass index (BMI) of 36.0 to 36.9 in adult [facility]) 04/25/2019  . Colorectal polyps   . Delirium 04/25/2021  . GIB (gastrointestinal bleeding) 05/11/2021  . Gout   . Hematochezia   . Hemorrhagic shock ([place])   . High anion gap metabolic acidosis 2/97/9892  . Hyperkalemia 04/19/2021  . Hyperlipidemia LDL goal <70   . Hypertension   . Hypokalemia 05/16/2021  . Hypothyroidism   . Kidney stone   . Leukocytosis 04/29/2021  . Malnutrition of moderate degree 04/21/2021  . Multifocal pneumonia 04/19/2021  . Pneumonia   . Severe sepsis with acute organ dysfunction ([place]) 04/19/2021  . SIRS (systemic inflammatory response syndrome) ([place]) 05/16/2021  . Testicular hypofunction   . Type 2 diabetes mellitus ([place]) 04/26/2019   Past Surgical History:  Procedure Laterality Date  .  CARDIAC CATHETERIZATION    . COLONOSCOPY N/A 05/15/2021   Procedure: COLONOSCOPY;  Surgeon: Lin Landsman, MD;  Location: [facility] ENDOSCOPY;  Service: Gastroenterology;  Laterality: N/A;  . DIALYSIS/PERMA CATHETER INSERTION N/A 04/22/2021   Procedure: DIALYSIS/PERMA CATHETER INSERTION;  Surgeon: Algernon Huxley, MD;  Location: [place] CV LAB;  Service: Cardiovascular;  Laterality: N/A;  . DIALYSIS/PERMA CATHETER INSERTION N/A 06/07/2021   Procedure: DIALYSIS/PERMA CATHETER INSERTION;  Surgeon: Algernon Huxley, MD;  Location: [place] CV LAB;  Service: Cardiovascular;  Laterality: N/A;  . ESOPHAGOGASTRODUODENOSCOPY (EGD) WITH PROPOFOL N/A 05/13/2021   Procedure:  ESOPHAGOGASTRODUODENOSCOPY (EGD) WITH PROPOFOL;  Surgeon: Jonathon Bellows, MD;  Location: [facility] ENDOSCOPY;  Service: Gastroenterology;  Laterality: N/A;  . FOOT SURGERY    . RIGHT/LEFT HEART CATH AND CORONARY ANGIOGRAPHY N/A 04/30/2019   Procedure: RIGHT/LEFT HEART CATH AND CORONARY ANGIOGRAPHY;  Surgeon: Troy Sine, MD;  Location: [place] CV LAB;  Service: Cardiovascular;  Laterality: N/A;    reports that he quit smoking about 19 years ago. His smoking use included cigarettes. He has a 30.00 pack-year smoking history. He has never used smokeless tobacco. He reports that he does not drink alcohol and does not use drugs.    Aeon Koors,E. Rogelio Seen 07/04/2021

## 2021-07-04 NOTE — Anesthesia Procedure Notes (Signed)
Anesthesia Regional Block: Peng block   Pre-Anesthetic Checklist: , timeout performed,  Correct Patient, Correct Site, Correct Laterality,  Correct Procedure, Correct Position, site marked,  Risks and benefits discussed,  Surgical consent,  Pre-op evaluation,  At surgeon's request and post-op pain management  Laterality: Right and Lower  Prep: chloraprep       Needles:  Injection technique: Single-shot  Needle Type: Echogenic Needle     Needle Length: 9cm  Needle Gauge: 21     Additional Needles:   Procedures:,,,, ultrasound used (permanent image in chart),,    Narrative:  Start time: 07/04/2021 6:22 PM End time: 07/04/2021 6:35 PM Injection made incrementally with aspirations every 5 mL.  Performed by: Personally  Anesthesiologist: Annye Asa, MD  Additional Notes: Pt identified in Holding room.  Monitors applied. Working IV access confirmed. Sterile prep R inguinal crease.  #21ga ECHOgenic Arrow block needle to PENG, below psoas, with US guidance.  30cc 0.5% Ropivacaine injected incrementally after negative test dose.  Patient asymptomatic, VSS, no heme aspirated, tolerated well.   Jenita Seashore, MD

## 2021-07-04 NOTE — Consult Note (Signed)
ORTHOPAEDIC CONSULTATION  REQUESTING PHYSICIAN: McDiarmid, Blane Ohara, MD  Chief Complaint: right hip fracture  HPI: Blake West is a 71 y.o. male with a history significant for ESRD on dialysis, diabetes, congestive heart failure, anemia, vasculitis who sustained a fall over his walker yesterday, and was brought into the emergency room and found to have a femoral neck fracture.  Given displacement of the femoral neck fracture arthroplasty is indicated.  I was asked to take over care of the patient by Dr. Lanelle Bal.  The patient at baseline is a household ambulator with a walker.  He states that he has been using a walker for ambulation for over 6 months.  This is secondary to generalized weakness and physical decline over the past year.  He has had chronic pain in this left knee which is at baseline.  He denies pain in other joints or extremities at this time.  He has baseline neuropathy in the lower extremities but denies new numbness and tingling.  2 weeks ago the patient was also started on antibiotics for a productive cough and upper respiratory symptoms concerning for upper respiratory infection.  Past Medical History:  Diagnosis Date   Abnormal EKG    Acute blood loss anemia (ABLA) 05/16/2021   Acute congestive heart failure ([place]) 04/25/2019   Acute hypoxemic respiratory failure ([place]) 04/23/2021   Acute renal failure superimposed on stage 4 chronic kidney disease ([place]) 04/19/2021   Acute urinary retention 04/25/2021   Biventricular failure (HCC)    CAD (coronary artery disease)    Class 2 severe obesity due to excess calories with serious comorbidity and body mass index (BMI) of 36.0 to 36.9 in adult [facility]) 04/25/2019   Colorectal polyps    Delirium 04/25/2021   GIB (gastrointestinal bleeding) 05/11/2021   Gout    Hematochezia    Hemorrhagic shock (HCC)    High anion gap metabolic acidosis 03/15/2540   Hyperkalemia 04/19/2021   Hyperlipidemia LDL goal <70    Hypertension    Hypokalemia 05/16/2021    Hypothyroidism    Kidney stone    Leukocytosis 04/29/2021   Malnutrition of moderate degree 04/21/2021   Multifocal pneumonia 04/19/2021   Pneumonia    Severe sepsis with acute organ dysfunction ([place]) 04/19/2021   SIRS (systemic inflammatory response syndrome) ([place]) 05/16/2021   Testicular hypofunction    Type 2 diabetes mellitus ([place]) 04/26/2019   Past Surgical History:  Procedure Laterality Date   CARDIAC CATHETERIZATION     COLONOSCOPY N/A 05/15/2021   Procedure: COLONOSCOPY;  Surgeon: Lin Landsman, MD;  Location: [place];  Service: Gastroenterology;  Laterality: N/A;   DIALYSIS/PERMA CATHETER INSERTION N/A 04/22/2021   Procedure: DIALYSIS/PERMA CATHETER INSERTION;  Surgeon: Algernon Huxley, MD;  Location: [place] CV LAB;  Service: Cardiovascular;  Laterality: N/A;   DIALYSIS/PERMA CATHETER INSERTION N/A 06/07/2021   Procedure: DIALYSIS/PERMA CATHETER INSERTION;  Surgeon: Algernon Huxley, MD;  Location: [place] CV LAB;  Service: Cardiovascular;  Laterality: N/A;   ESOPHAGOGASTRODUODENOSCOPY (EGD) WITH PROPOFOL N/A 05/13/2021   Procedure: ESOPHAGOGASTRODUODENOSCOPY (EGD) WITH PROPOFOL;  Surgeon: Jonathon Bellows, MD;  Location: [facility] ENDOSCOPY;  Service: Gastroenterology;  Laterality: N/A;   FOOT SURGERY     RIGHT/LEFT HEART CATH AND CORONARY ANGIOGRAPHY N/A 04/30/2019   Procedure: RIGHT/LEFT HEART CATH AND CORONARY ANGIOGRAPHY;  Surgeon: Troy Sine, MD;  Location: [place] CV LAB;  Service: Cardiovascular;  Laterality: N/A;   Social History   Socioeconomic History   Marital status: Married    Spouse name:  Not on file   Number of children: Not on file   Years of education: Not on file   Highest education level: Not on file  Occupational History   Occupation: Chief Financial Officer  Tobacco Use   Smoking status: Former    Packs/day: 1.00    Years: 30.00    Total pack years: 30.00    Types: Cigarettes    Quit date: 01/24/2002    Years since quitting: 19.4   Smokeless  tobacco: Never  Vaping Use   Vaping Use: Never used  Substance and Sexual Activity   Alcohol use: No   Drug use: No   Sexual activity: Not on file  Other Topics Concern   Not on file  Social History Narrative   Not on file   Social Determinants of Health   Financial Resource Strain: Not on file  Food Insecurity: Not on file  Transportation Needs: Not on file  Physical Activity: Not on file  Stress: Not on file  Social Connections: Not on file   Family History  Problem Relation Age of Onset   Heart attack Mother 60   Pulmonary fibrosis Father    Congestive Heart Failure Brother    Heart attack Brother    Heart attack Brother    Early death Sister    Kidney disease Paternal Grandfather    Diabetes Sister    Allergies  Allergen Reactions   Codeine Other (See Comments)    Joints hurt  Other reaction(s): Other (See Comments) Joints hurt  Other reaction(s): Other (See Comments) Joints hurt  Joints hurt     Citalopram Other (See Comments)    agitation   Gabapentin Other (See Comments)    agitation   Lyrica [Pregabalin] Other (See Comments)    agitation     Positive ROS: All other systems have been reviewed and were otherwise negative with the exception of those mentioned in the HPI and as above.  Physical Exam: General: Alert, no acute distress Cardiovascular: No pedal edema Respiratory: No cyanosis, no use of accessory musculature Skin: No lesions in the area of chief complaint Neurologic: Sensation intact distally Psychiatric: Patient is competent for consent with normal mood and affect  MUSCULOSKELETAL:  RLE No traumatic wounds, ecchymosis, or rash  Nontender  groin pain with log roll  No knee or ankle effusion  Sens DPN, SPN, TN intact  Motor EHL, ext, flex 5/5  WWP distally, No significant edema LLE No traumatic wounds, ecchymosis, or rash  Nontender  No groin pain with log roll  No knee or ankle effusion  Sens DPN, SPN, TN intact  Motor EHL,  ext, flex 5/5  WWP distally, No significant edema   IMAGING: X-rays pelvis and right hip demonstrate displaced femoral neck fracture, no significant underlying arthritis  Assessment: Principal Problem:   Displaced fracture of right femoral neck (HCC) Active Problems:   Hypertension associated with diabetes (HCC)   OSA (obstructive sleep apnea)   Type 2 diabetes mellitus (HCC)   Nonischemic cardiomyopathy (HCC)   Coronary artery disease involving native coronary artery of native heart without angina pectoris   Heart failure with mid-range ejection fraction (HCC)   Chronic atrial fibrillation with RVR (HCC)   ESRD (end stage renal disease) on dialysis (HCC)   ANCA-associated vasculitis (HCC)   Macrocytic anemia   Thrombocytopenia (HCC)   Confusion  Right displaced femoral neck fracture  Plan: Had a good conversation with the patient and his wife this evening.  Reviewed his imaging and explained  that given the displacement of the femoral neck fracture he is indicated for arthroplasty.  Discussed surgical treatment options with hemiarthroplasty versus total hip arthroplasty. Given the patient's multiple concerning medical comorbidities, poor health, and low demand, I feel patient would be a good candidate for a hip hemiarthroplasty over total hip arthroplasty.  Discussed that he is notably at a higher risk for infection given that he is on dialysis and has had a recent history of other infections.  The risks benefits and alternatives were discussed with the patient including but not limited to the risks of nonoperative treatment, versus surgical intervention including infection, bleeding, nerve injury, periprosthetic fracture, the need for revision surgery, dislocation, leg length discrepancy, blood clots, cardiopulmonary complications, morbidity, mortality, among others, and they were willing to proceed.       Willaim Sheng, MD  Contact information:   [id number] 7am-5pm epic  message Dr. Zachery Dakins, or call office for patient follow up: [phone] After hours and holidays please check [email or web address] for group call information for [facility]

## 2021-07-04 NOTE — Assessment & Plan Note (Addendum)
Recent TTE showing EF 40 to 45%.  Appears mildly volume overloaded on exam with 1+ pitting edema, improved.  - TED hose to mobilize fluid

## 2021-07-04 NOTE — H&P (View-Only) (Signed)
ORTHOPAEDIC CONSULTATION  REQUESTING PHYSICIAN: McDiarmid, Blane Ohara, MD  Chief Complaint: right hip fracture  HPI: Blake West is a 71 y.o. male with a history significant for ESRD on dialysis, diabetes, congestive heart failure, anemia, vasculitis who sustained a fall over his walker yesterday, and was brought into the emergency room and found to have a femoral neck fracture.  Given displacement of the femoral neck fracture arthroplasty is indicated.  I was asked to take over care of the patient by Dr. Lanelle Bal.  The patient at baseline is a household ambulator with a walker.  He states that he has been using a walker for ambulation for over 6 months.  This is secondary to generalized weakness and physical decline over the past year.  He has had chronic pain in this left knee which is at baseline.  He denies pain in other joints or extremities at this time.  He has baseline neuropathy in the lower extremities but denies new numbness and tingling.  2 weeks ago the patient was also started on antibiotics for a productive cough and upper respiratory symptoms concerning for upper respiratory infection.  Past Medical History:  Diagnosis Date   Abnormal EKG    Acute blood loss anemia (ABLA) 05/16/2021   Acute congestive heart failure ([place]) 04/25/2019   Acute hypoxemic respiratory failure ([place]) 04/23/2021   Acute renal failure superimposed on stage 4 chronic kidney disease ([place]) 04/19/2021   Acute urinary retention 04/25/2021   Biventricular failure (HCC)    CAD (coronary artery disease)    Class 2 severe obesity due to excess calories with serious comorbidity and body mass index (BMI) of 36.0 to 36.9 in adult [facility]) 04/25/2019   Colorectal polyps    Delirium 04/25/2021   GIB (gastrointestinal bleeding) 05/11/2021   Gout    Hematochezia    Hemorrhagic shock (HCC)    High anion gap metabolic acidosis 1/61/0960   Hyperkalemia 04/19/2021   Hyperlipidemia LDL goal <70    Hypertension    Hypokalemia 05/16/2021    Hypothyroidism    Kidney stone    Leukocytosis 04/29/2021   Malnutrition of moderate degree 04/21/2021   Multifocal pneumonia 04/19/2021   Pneumonia    Severe sepsis with acute organ dysfunction ([place]) 04/19/2021   SIRS (systemic inflammatory response syndrome) ([place]) 05/16/2021   Testicular hypofunction    Type 2 diabetes mellitus ([place]) 04/26/2019   Past Surgical History:  Procedure Laterality Date   CARDIAC CATHETERIZATION     COLONOSCOPY N/A 05/15/2021   Procedure: COLONOSCOPY;  Surgeon: Lin Landsman, MD;  Location: [place];  Service: Gastroenterology;  Laterality: N/A;   DIALYSIS/PERMA CATHETER INSERTION N/A 04/22/2021   Procedure: DIALYSIS/PERMA CATHETER INSERTION;  Surgeon: Algernon Huxley, MD;  Location: [place] CV LAB;  Service: Cardiovascular;  Laterality: N/A;   DIALYSIS/PERMA CATHETER INSERTION N/A 06/07/2021   Procedure: DIALYSIS/PERMA CATHETER INSERTION;  Surgeon: Algernon Huxley, MD;  Location: [place] CV LAB;  Service: Cardiovascular;  Laterality: N/A;   ESOPHAGOGASTRODUODENOSCOPY (EGD) WITH PROPOFOL N/A 05/13/2021   Procedure: ESOPHAGOGASTRODUODENOSCOPY (EGD) WITH PROPOFOL;  Surgeon: Jonathon Bellows, MD;  Location: [facility] ENDOSCOPY;  Service: Gastroenterology;  Laterality: N/A;   FOOT SURGERY     RIGHT/LEFT HEART CATH AND CORONARY ANGIOGRAPHY N/A 04/30/2019   Procedure: RIGHT/LEFT HEART CATH AND CORONARY ANGIOGRAPHY;  Surgeon: Troy Sine, MD;  Location: [place] CV LAB;  Service: Cardiovascular;  Laterality: N/A;   Social History   Socioeconomic History   Marital status: Married    Spouse name:  Not on file   Number of children: Not on file   Years of education: Not on file   Highest education level: Not on file  Occupational History   Occupation: Chief Financial Officer  Tobacco Use   Smoking status: Former    Packs/day: 1.00    Years: 30.00    Total pack years: 30.00    Types: Cigarettes    Quit date: 01/24/2002    Years since quitting: 19.4   Smokeless  tobacco: Never  Vaping Use   Vaping Use: Never used  Substance and Sexual Activity   Alcohol use: No   Drug use: No   Sexual activity: Not on file  Other Topics Concern   Not on file  Social History Narrative   Not on file   Social Determinants of Health   Financial Resource Strain: Not on file  Food Insecurity: Not on file  Transportation Needs: Not on file  Physical Activity: Not on file  Stress: Not on file  Social Connections: Not on file   Family History  Problem Relation Age of Onset   Heart attack Mother 43   Pulmonary fibrosis Father    Congestive Heart Failure Brother    Heart attack Brother    Heart attack Brother    Early death Sister    Kidney disease Paternal Grandfather    Diabetes Sister    Allergies  Allergen Reactions   Codeine Other (See Comments)    Joints hurt  Other reaction(s): Other (See Comments) Joints hurt  Other reaction(s): Other (See Comments) Joints hurt  Joints hurt     Citalopram Other (See Comments)    agitation   Gabapentin Other (See Comments)    agitation   Lyrica [Pregabalin] Other (See Comments)    agitation     Positive ROS: All other systems have been reviewed and were otherwise negative with the exception of those mentioned in the HPI and as above.  Physical Exam: General: Alert, no acute distress Cardiovascular: No pedal edema Respiratory: No cyanosis, no use of accessory musculature Skin: No lesions in the area of chief complaint Neurologic: Sensation intact distally Psychiatric: Patient is competent for consent with normal mood and affect  MUSCULOSKELETAL:  RLE No traumatic wounds, ecchymosis, or rash  Nontender  groin pain with log roll  No knee or ankle effusion  Sens DPN, SPN, TN intact  Motor EHL, ext, flex 5/5  WWP distally, No significant edema LLE No traumatic wounds, ecchymosis, or rash  Nontender  No groin pain with log roll  No knee or ankle effusion  Sens DPN, SPN, TN intact  Motor EHL,  ext, flex 5/5  WWP distally, No significant edema   IMAGING: X-rays pelvis and right hip demonstrate displaced femoral neck fracture, no significant underlying arthritis  Assessment: Principal Problem:   Displaced fracture of right femoral neck (HCC) Active Problems:   Hypertension associated with diabetes (HCC)   OSA (obstructive sleep apnea)   Type 2 diabetes mellitus (HCC)   Nonischemic cardiomyopathy (HCC)   Coronary artery disease involving native coronary artery of native heart without angina pectoris   Heart failure with mid-range ejection fraction (HCC)   Chronic atrial fibrillation with RVR (HCC)   ESRD (end stage renal disease) on dialysis (HCC)   ANCA-associated vasculitis (HCC)   Macrocytic anemia   Thrombocytopenia (HCC)   Confusion  Right displaced femoral neck fracture  Plan: Had a good conversation with the patient and his wife this evening.  Reviewed his imaging and explained  that given the displacement of the femoral neck fracture he is indicated for arthroplasty.  Discussed surgical treatment options with hemiarthroplasty versus total hip arthroplasty. Given the patient's multiple concerning medical comorbidities, poor health, and low demand, I feel patient would be a good candidate for a hip hemiarthroplasty over total hip arthroplasty.  Discussed that he is notably at a higher risk for infection given that he is on dialysis and has had a recent history of other infections.  The risks benefits and alternatives were discussed with the patient including but not limited to the risks of nonoperative treatment, versus surgical intervention including infection, bleeding, nerve injury, periprosthetic fracture, the need for revision surgery, dislocation, leg length discrepancy, blood clots, cardiopulmonary complications, morbidity, mortality, among others, and they were willing to proceed.       Willaim Sheng, MD  Contact information:   [id number] 7am-5pm epic  message Dr. Zachery Dakins, or call office for patient follow up: [phone] After hours and holidays please check [email or web address] for group call information for [facility]

## 2021-07-04 NOTE — Consult Note (Signed)
[facility] Renal Consultation Note    Indication for Consultation:  Management of ESRD/hemodialysis; anemia, hypertension/volume and secondary hyperparathyroidism  HPI: Blake West is a 71 y.o. male has a PMH significant for CAD, h/o GIB, chronic combined diastolic and systolic CHF, HTN, DM type 2, HLD, atrial fibrillation with RVR (not on coagulation due to recent GIB on 05/11/21), and HD-dependent AKI due to ANCA vasculitis and recent start on HD in April 2023 who presented to [facility] ED on 07/03/21 after falling at home.  He reports that he slipped and fell on his right side but did not hit his head.  He seemed confused and EMS was called.  Per EMS report, pt complaining of severe right hip pain.  In the ED, Xray noted right displaced femoral neck fracture and was admitted for surgical intervention.  We were consulted to provide dialysis during his hospitalization.  His wife reports that he has been having a tough time with HD due to hypotension and inability to sit in recliner for 4 hours due to discomfort and agitation.  He requires midodrine prior to HD.  Past Medical History:  Diagnosis Date   Abnormal EKG    Acute blood loss anemia (ABLA) 05/16/2021   Acute congestive heart failure ([place]) 04/25/2019   Acute hypoxemic respiratory failure ([place]) 04/23/2021   Acute renal failure superimposed on stage 4 chronic kidney disease ([place]) 04/19/2021   Acute urinary retention 04/25/2021   Biventricular failure (HCC)    CAD (coronary artery disease)    Class 2 severe obesity due to excess calories with serious comorbidity and body mass index (BMI) of 36.0 to 36.9 in adult [facility]) 04/25/2019   Colorectal polyps    Delirium 04/25/2021   GIB (gastrointestinal bleeding) 05/11/2021   Gout    Hematochezia    Hemorrhagic shock (HCC)    High anion gap metabolic acidosis 3/53/6144   Hyperkalemia 04/19/2021   Hyperlipidemia LDL goal <70    Hypertension    Hypokalemia 05/16/2021   Hypothyroidism    Kidney  stone    Leukocytosis 04/29/2021   Malnutrition of moderate degree 04/21/2021   Multifocal pneumonia 04/19/2021   Pneumonia    Severe sepsis with acute organ dysfunction ([place]) 04/19/2021   SIRS (systemic inflammatory response syndrome) ([place]) 05/16/2021   Testicular hypofunction    Type 2 diabetes mellitus ([place]) 04/26/2019   Past Surgical History:  Procedure Laterality Date   CARDIAC CATHETERIZATION     COLONOSCOPY N/A 05/15/2021   Procedure: COLONOSCOPY;  Surgeon: Lin Landsman, MD;  Location: [place];  Service: Gastroenterology;  Laterality: N/A;   DIALYSIS/PERMA CATHETER INSERTION N/A 04/22/2021   Procedure: DIALYSIS/PERMA CATHETER INSERTION;  Surgeon: Algernon Huxley, MD;  Location: [place] CV LAB;  Service: Cardiovascular;  Laterality: N/A;   DIALYSIS/PERMA CATHETER INSERTION N/A 06/07/2021   Procedure: DIALYSIS/PERMA CATHETER INSERTION;  Surgeon: Algernon Huxley, MD;  Location: [place] CV [facility];  Service: Cardiovascular;  Laterality: N/A;   ESOPHAGOGASTRODUODENOSCOPY (EGD) WITH PROPOFOL N/A 05/13/2021   Procedure: ESOPHAGOGASTRODUODENOSCOPY (EGD) WITH PROPOFOL;  Surgeon: Jonathon Bellows, MD;  Location: [facility] ENDOSCOPY;  Service: Gastroenterology;  Laterality: N/A;   FOOT SURGERY     RIGHT/LEFT HEART CATH AND CORONARY ANGIOGRAPHY N/A 04/30/2019   Procedure: RIGHT/LEFT HEART CATH AND CORONARY ANGIOGRAPHY;  Surgeon: Troy Sine, MD;  Location: [place] [facility] LAB;  Service: Cardiovascular;  Laterality: N/A;   Family History:   Family History  Problem Relation Age of Onset   Heart attack Mother 48  Pulmonary fibrosis Father    Congestive Heart Failure Brother    Heart attack Brother    Heart attack Brother    Early death Sister    Kidney disease Paternal Grandfather    Diabetes Sister    Social History:  reports that he quit smoking about 19 years ago. His smoking use included cigarettes. He has a 30.00 pack-year smoking history. He has never used smokeless tobacco. He  reports that he does not drink alcohol and does not use drugs. Allergies  Allergen Reactions   Codeine Other (See Comments)    Joints hurt  Other reaction(s): Other (See Comments) Joints hurt  Other reaction(s): Other (See Comments) Joints hurt  Joints hurt     Citalopram Other (See Comments)    agitation   Gabapentin Other (See Comments)    agitation   Lyrica [Pregabalin] Other (See Comments)    agitation   Prior to Admission medications   Medication Sig Start Date End Date Taking? Authorizing Provider  acetaminophen (TYLENOL) 325 MG tablet Take 650 mg by mouth every 6 (six) hours as needed for moderate pain.   Yes [provider]  acidophilus (RISAQUAD) CAPS capsule Take 2 capsules by mouth 3 (three) times daily. 04/30/21  Yes Annita Brod, MD  Ascorbic Acid (VITAMIN C) 100 MG tablet Take 100 mg by mouth daily.   Yes [provider]  B Complex Vitamins (B COMPLEX PO) Take 1 capsule by mouth daily.   Yes [provider]  calcium-vitamin D (OSCAL WITH D) 500-5 MG-MCG tablet Take 1 tablet by mouth.   Yes [provider]  magnesium 30 MG tablet Take 30 mg by mouth 2 (two) times daily.   Yes [provider]  metoprolol succinate (TOPROL-XL) 50 MG 24 hr tablet Take 1 tablet (50 mg total) by mouth daily. 05/18/21  Yes Nicole Kindred A, DO  midodrine (PROAMATINE) 10 MG tablet Take 10 mg by mouth 3 (three) times a week. Tuesday, Thursday, and Saturday 07/01/21  Yes [provider]  omega-3 acid ethyl esters (LOVAZA) 1 g capsule Take 1 g by mouth 2 (two) times daily.   Yes [provider]  pantoprazole (PROTONIX) 40 MG tablet Take 1 tablet (40 mg total) by mouth 2 (two) times daily. 05/18/21  Yes Nicole Kindred A, DO  predniSONE (DELTASONE) 20 MG tablet Take 3 tablets (60 mg total) by mouth daily with breakfast. Patient taking differently: Take 60 mg by mouth daily with breakfast. Titrate down 1 tablet weekly till taking 1 tablet  (20 mg) by mouth daily. 05/01/21  Yes Annita Brod, MD  Pyridoxine HCl (VITAMIN B6 PO) Take 1 capsule by mouth daily.   Yes [provider]  QUEtiapine (SEROQUEL) 100 MG tablet Take 1 tablet (100 mg total) by mouth at bedtime. 04/30/21  Yes Annita Brod, MD  sulfamethoxazole-trimethoprim (BACTRIM DS) 800-160 MG tablet Take 1 tablet by mouth 3 (three) times a week. 05/03/21  Yes Annita Brod, MD  thiamine 250 MG tablet Take 500 mg by mouth daily.   Yes [provider]  VITAMIN E PO Take 1 Capful by mouth daily.   Yes [provider]   Current Facility-Administered Medications  Medication Dose Route Frequency Provider Last Rate Last Admin   acetaminophen (TYLENOL) tablet 650 mg  650 mg Oral Q6H Espinoza, Alejandra, DO   650 mg at 07/04/21 1130   Or   acetaminophen (TYLENOL) suppository 650 mg  650 mg Rectal Q6H Sharion Settler, DO  calcium-vitamin D (OSCAL WITH D) 500-5 MG-MCG per tablet 1 tablet  1 tablet Oral Q breakfast Espinoza, Alejandra, DO   1 tablet at 07/04/21 0800   fentaNYL (SUBLIMAZE) injection 50 mcg  50 mcg Intravenous Q2H PRN Sharion Settler, DO   50 mcg at 07/04/21 0622   metoprolol succinate (TOPROL-XL) 24 hr tablet 50 mg  50 mg Oral Daily Espinoza, Alejandra, DO   50 mg at 07/04/21 1251   [START ON 07/05/2021] midodrine (PROAMATINE) tablet 10 mg  10 mg Oral Once per day on Mon Wed Fri Espinoza, Alejandra, DO       omega-3 acid ethyl esters (LOVAZA) capsule 1 g  1 g Oral BID Espinoza, Alejandra, DO   1 g at 07/04/21 1000   oxyCODONE (Oxy IR/ROXICODONE) immediate release tablet 2.5 mg  2.5 mg Oral Q6H Espinoza, Alejandra, DO   2.5 mg at 07/04/21 0909   pantoprazole (PROTONIX) EC tablet 40 mg  40 mg Oral BID Sharion Settler, DO   40 mg at 07/04/21 1000   polyethylene glycol (MIRALAX / GLYCOLAX) packet 17 g  17 g Oral Daily PRN Sharion Settler, DO       [START ON 07/08/2021] predniSONE (DELTASONE) tablet 20 mg  20 mg Oral Q  breakfast Zola Button, MD       predniSONE (DELTASONE) tablet 30 mg  30 mg Oral Q breakfast Zola Button, MD   30 mg at 07/04/21 1115   QUEtiapine (SEROQUEL) tablet 100 mg  100 mg Oral QHS Espinoza, Alejandra, DO   100 mg at 07/03/21 2120   [START ON 07/05/2021] sulfamethoxazole-trimethoprim (BACTRIM DS) 800-160 MG per tablet 1 tablet  1 tablet Oral Once per day on Mon Wed Fri Espinoza, Alejandra, DO       vitamin C (ASCORBIC ACID) tablet 125 mg  125 mg Oral Daily Sharion Settler, DO   125 mg at 07/04/21 1000   Labs: Basic Metabolic Panel: Recent Labs  Lab 07/03/21 1620 07/04/21 0553  NA 138 138  K 4.7 5.1  CL 97* 98  CO2 24 25  GLUCOSE 137* 117*  BUN 74* 80*  CREATININE 4.67* 5.36*  CALCIUM 8.1* 8.2*  PHOS  --  7.1*   Liver Function Tests: Recent Labs  Lab 07/03/21 1620 07/04/21 0553  AST 24  --   ALT 31  --   ALKPHOS 39  --   BILITOT 0.7  --   PROT 5.7*  --   ALBUMIN 3.1* 3.0*   No results for input(s): "LIPASE", "AMYLASE" in the last 168 hours. Recent Labs  Lab 07/03/21 1620  AMMONIA 11   CBC: Recent Labs  Lab 07/03/21 1620 07/04/21 0553  WBC 7.3 7.4  NEUTROABS 5.3  --   HGB 8.8* 8.9*  HCT 27.3* 27.1*  MCV 102.6* 102.3*  PLT 145* 133*   Cardiac Enzymes: No results for input(s): "CKTOTAL", "CKMB", "CKMBINDEX", "TROPONINI" in the last 168 hours. CBG: Recent Labs  Lab 07/03/21 1754  GLUCAP 125*   Iron Studies: No results for input(s): "IRON", "TIBC", "TRANSFERRIN", "FERRITIN" in the last 72 hours. Studies/Results: CT Head Wo Contrast  Result Date: 07/03/2021 CLINICAL DATA:  [place] status change of unknown cause. EXAM: CT HEAD WITHOUT CONTRAST CT CERVICAL SPINE WITHOUT CONTRAST TECHNIQUE: Multidetector CT imaging of the head and cervical spine was performed following the standard protocol without intravenous contrast. Multiplanar CT image reconstructions of the cervical spine were also generated. RADIATION DOSE REDUCTION: This exam was performed  according to the departmental dose-optimization program which includes  automated exposure control, adjustment of the mA and/or kV according to patient size and/or use of iterative reconstruction technique. COMPARISON:  09/17/2015 FINDINGS: CT HEAD FINDINGS Brain: No evidence of acute infarction, hemorrhage, hydrocephalus, extra-axial collection or mass lesion/mass effect. Mild ventricular sulcal enlargement reflecting age-appropriate volume loss. Mild periventricular white matter hypoattenuation is also present consistent with chronic microvascular ischemic change. Vascular: No hyperdense vessel or unexpected calcification. Skull: Normal. Negative for fracture or focal lesion. Sinuses/Orbits: Globes and orbits are unremarkable. Visualized sinuses are clear. Other: None. CT CERVICAL SPINE FINDINGS Alignment: Grade 1 anterolisthesis, C7 on T1 and C2 on C3, both degenerative. No other spondylolisthesis. Reversed cervical lordosis, apex at C3-C4. Skull base and vertebrae: No acute fracture. No primary bone lesion or focal pathologic process. Soft tissues and spinal canal: No prevertebral fluid or swelling. No visible canal hematoma. Disc levels: Mild to moderate loss of disc height at C2-C3. Marked loss of disc height at C3-C4, C4-C5, C5-C6 and C6-C7. Moderate loss of disc height at C7-T1. No evidence of a disc herniation. Upper chest: No acute findings. Other: None. IMPRESSION: HEAD CT 1. No acute intracranial abnormalities. 2. Age-appropriate volume loss and mild chronic microvascular ischemic change. CERVICAL CT 1. No fracture or acute finding. 2. Degenerative changes as detailed. Electronically Signed   By: Lajean Manes M.D.   On: 07/03/2021 17:40   CT Cervical Spine Wo Contrast  Result Date: 07/03/2021 CLINICAL DATA:  [place] status change of unknown cause. EXAM: CT HEAD WITHOUT CONTRAST CT CERVICAL SPINE WITHOUT CONTRAST TECHNIQUE: Multidetector CT imaging of the head and cervical spine was performed following  the standard protocol without intravenous contrast. Multiplanar CT image reconstructions of the cervical spine were also generated. RADIATION DOSE REDUCTION: This exam was performed according to the departmental dose-optimization program which includes automated exposure control, adjustment of the mA and/or kV according to patient size and/or use of iterative reconstruction technique. COMPARISON:  09/17/2015 FINDINGS: CT HEAD FINDINGS Brain: No evidence of acute infarction, hemorrhage, hydrocephalus, extra-axial collection or mass lesion/mass effect. Mild ventricular sulcal enlargement reflecting age-appropriate volume loss. Mild periventricular white matter hypoattenuation is also present consistent with chronic microvascular ischemic change. Vascular: No hyperdense vessel or unexpected calcification. Skull: Normal. Negative for fracture or focal lesion. Sinuses/Orbits: Globes and orbits are unremarkable. Visualized sinuses are clear. Other: None. CT CERVICAL SPINE FINDINGS Alignment: Grade 1 anterolisthesis, C7 on T1 and C2 on C3, both degenerative. No other spondylolisthesis. Reversed cervical lordosis, apex at C3-C4. Skull base and vertebrae: No acute fracture. No primary bone lesion or focal pathologic process. Soft tissues and spinal canal: No prevertebral fluid or swelling. No visible canal hematoma. Disc levels: Mild to moderate loss of disc height at C2-C3. Marked loss of disc height at C3-C4, C4-C5, C5-C6 and C6-C7. Moderate loss of disc height at C7-T1. No evidence of a disc herniation. Upper chest: No acute findings. Other: None. IMPRESSION: HEAD CT 1. No acute intracranial abnormalities. 2. Age-appropriate volume loss and mild chronic microvascular ischemic change. CERVICAL CT 1. No fracture or acute finding. 2. Degenerative changes as detailed. Electronically Signed   By: Lajean Manes M.D.   On: 07/03/2021 17:40   DG Hip Unilat W or Wo Pelvis 2-3 Views Right  Result Date: 07/03/2021 CLINICAL DATA:   Trauma, fall EXAM: DG HIP (WITH OR WITHOUT PELVIS) 2-3V RIGHT COMPARISON:  None Available. FINDINGS: There is comminuted fracture in the neck of right femur. There is slight superior displacement of distal major fracture fragment. There is no dislocation. IMPRESSION: Comminuted,  displaced fracture is seen in the neck of proximal right femur. Electronically Signed   By: Elmer Picker M.D.   On: 07/03/2021 16:41   DG Chest 1 View  Result Date: 07/03/2021 CLINICAL DATA:  Confusion and fall EXAM: CHEST  1 VIEW COMPARISON:  06/04/2021 FINDINGS: Cardiomegaly. Large-bore right neck multi lumen vascular catheter. Unchanged scarring and or atelectasis of the left lung base. The visualized skeletal structures are unremarkable. IMPRESSION: Cardiomegaly without acute abnormality of the lungs in AP portable projection. Electronically Signed   By: Delanna Ahmadi M.D.   On: 07/03/2021 16:41    ROS: Pertinent items are noted in HPI. Physical Exam: Vitals:   07/04/21 1125 07/04/21 1200 07/04/21 1225 07/04/21 1324  BP: 108/89  (!) 119/99 112/79  Pulse: (!) 145 (!) 139 (!) 147 (!) 148  Resp: 15 15 16 20   Temp:   98.7 F (37.1 C) 98.7 F (37.1 C)  TempSrc:      SpO2: 95% 95% 96% 96%  Weight:      Height:          Weight change:   Intake/Output Summary (Last 24 hours) at 07/04/2021 1347 Last data filed at 07/04/2021 0800 Gross per 24 hour  Intake 120 ml  Output --  Net 120 ml   BP 112/79   Pulse (!) 148   Temp 98.7 F (37.1 C)   Resp 20   Ht 5\' 11"  (1.803 m)   Wt 95.4 kg   SpO2 96%   BMI 29.33 kg/m  General appearance: cooperative, mild distress, and slowed mentation Head: Normocephalic, without obvious abnormality, atraumatic Eyes: negative findings: lids and lashes normal, conjunctivae and sclerae normal, and corneas clear Resp: clear to auscultation bilaterally Cardio: irregularly irregular rhythm and tachycardic GI: soft, non-tender; bowel sounds normal; no masses,  no  organomegaly Extremities: edema 1-2+ pretibial edema bilaterally and painful right hip Dialysis Access:  Dialysis Orders: Center: [facility]  on TTS . Dialysis prescription is unknown and center is closed.  Will need to call tomorrow for orders. Assessment/Plan:  Displaced right femoral neck fracture - Ortho consulted and plan on right hip arthroplasty on 07/05/21.  ESRD -  Will plan to continue with TTS schedule.  Will need to call his home unit for prescription.  Hypertension/volume  - has 1-2 + edema but RVR and low BP.  Will UF as tolerated and continue with midodrine before HD.  Anemia  - Will check iron stores and ESA dose from HD center.  Will likely require transfusion after surgery.  Metabolic bone disease -  continue with home meds  Nutrition - renal diet carb, modified  ANCA vasculitis - s/p rituximab in April.  Per his wife, the biopsy revealed "a lot of scarring" and don't believe he will regain kidney function.  Continue with prednisone taper and PCP prophylaxis with Bactrim.  Atrial fibrillation with RVR - not on anticoagulation due to recent GI bleed.  Cardiology consulted for rate control prior to surgery.  Chronic combined systolic and diastolic CHF - has edema but UF limited by hypotension and A fib RVR.  Will UF as tolerated with HD and continue to use midodrine for BP support.  Hyperkalemia - will dose Lokelma and follow.   Donetta Potts, MD [place] 07/04/2021, 1:47 PM

## 2021-07-04 NOTE — Assessment & Plan Note (Deleted)
Diet controlled, monitor on labs.

## 2021-07-04 NOTE — Consult Note (Signed)
Cardiology Consultation:   Patient ID: Blake West MRN: [id number]; DOB: 1950-03-23  Admit date: 07/03/2021 Date of Consult: 07/04/2021  PCP:  Leonel Ramsay, MD   [facility] Providers Cardiologist:  Nelva Bush, MD        Patient Profile:   Blake West is a 71 y.o. male with a hx of permanent atrial fibrillation not on anticoagulation due to recent GI bleed, ESRD on HD, nonischemic cardiomyopathy who is being seen 07/04/2021 for the evaluation of preoperative cardiovascular risk at the request of Dr. McDiarmid.  History of Present Illness:   Blake West presented after fall, found to have a right femoral neck fracture. Cardiology is consulted for preoperative risk evaluation. Formal ortho consult is pending, plan for anesthesia unclear.  He has had a complicated recent course, including admission for profound GI bleed requiring multiple transfusions. He is followed by Dr. Saunders Revel but has not returned to clinic since 2021 (he was recommended to return to clinic in 1 month at that time).  He tells me his heart rate is "always fast." He says it is typically 130s-150s bpm. At his last visit with Dr. Saunders Revel in 2021, it was 80 bpm. Appears during his recent hospitalization in April, rates 80s-110s.  He is now on midodrine prior to dialysis MWF. He reports that he does make some urine and currently feels like he needs to urinate. He has an external catheter in place.  He denies chest pain. Breathing is stable. No syncope. Cannot feel that his heart rate is fast.  Reviewed his prior cardiac workup.  Past Medical History:  Diagnosis Date   Abnormal EKG    Acute blood loss anemia (ABLA) 05/16/2021   Acute congestive heart failure ([place]) 04/25/2019   Acute hypoxemic respiratory failure ([place]) 04/23/2021   Acute renal failure superimposed on stage 4 chronic kidney disease ([employer]) 04/19/2021   Acute urinary retention 04/25/2021   Biventricular failure (HCC)    CAD (coronary artery disease)     Class 2 severe obesity due to excess calories with serious comorbidity and body mass index (BMI) of 36.0 to 36.9 in adult [facility]) 04/25/2019   Colorectal polyps    Delirium 04/25/2021   GIB (gastrointestinal bleeding) 05/11/2021   Gout    Hematochezia    Hemorrhagic shock (HCC)    High anion gap metabolic acidosis 4/96/7591   Hyperkalemia 04/19/2021   Hyperlipidemia LDL goal <70    Hypertension    Hypokalemia 05/16/2021   Hypothyroidism    Kidney stone    Leukocytosis 04/29/2021   Malnutrition of moderate degree 04/21/2021   Multifocal pneumonia 04/19/2021   Pneumonia    Severe sepsis with acute organ dysfunction ([place]) 04/19/2021   SIRS (systemic inflammatory response syndrome) ([place]) 05/16/2021   Testicular hypofunction    Type 2 diabetes mellitus ([place]) 04/26/2019    Past Surgical History:  Procedure Laterality Date   CARDIAC CATHETERIZATION     COLONOSCOPY N/A 05/15/2021   Procedure: COLONOSCOPY;  Surgeon: Lin Landsman, MD;  Location: [place];  Service: Gastroenterology;  Laterality: N/A;   DIALYSIS/PERMA CATHETER INSERTION N/A 04/22/2021   Procedure: DIALYSIS/PERMA CATHETER INSERTION;  Surgeon: Algernon Huxley, MD;  Location: [place] [facility];  Service: Cardiovascular;  Laterality: N/A;   DIALYSIS/PERMA CATHETER INSERTION N/A 06/07/2021   Procedure: DIALYSIS/PERMA CATHETER INSERTION;  Surgeon: Algernon Huxley, MD;  Location: [place] [facility];  Service: Cardiovascular;  Laterality: N/A;   ESOPHAGOGASTRODUODENOSCOPY (EGD) WITH PROPOFOL N/A 05/13/2021   Procedure: ESOPHAGOGASTRODUODENOSCOPY (EGD)  WITH PROPOFOL;  Surgeon: Jonathon Bellows, MD;  Location: [facility] ENDOSCOPY;  Service: Gastroenterology;  Laterality: N/A;   FOOT SURGERY     RIGHT/LEFT HEART CATH AND CORONARY ANGIOGRAPHY N/A 04/30/2019   Procedure: RIGHT/LEFT HEART CATH AND CORONARY ANGIOGRAPHY;  Surgeon: Troy Sine, MD;  Location: [place] [facility];  Service: Cardiovascular;  Laterality: N/A;     Home Medications:  Prior  to Admission medications   Medication Sig Start Date End Date Taking? Authorizing Provider  acetaminophen (TYLENOL) 325 MG tablet Take 650 mg by mouth every 6 (six) hours as needed for moderate pain.   Yes [provider]  acidophilus (RISAQUAD) CAPS capsule Take 2 capsules by mouth 3 (three) times daily. 04/30/21  Yes Annita Brod, MD  Ascorbic Acid (VITAMIN C) 100 MG tablet Take 100 mg by mouth daily.   Yes [provider]  B Complex Vitamins (B COMPLEX PO) Take 1 capsule by mouth daily.   Yes [provider]  calcium-vitamin D (OSCAL WITH D) 500-5 MG-MCG tablet Take 1 tablet by mouth.   Yes [provider]  magnesium 30 MG tablet Take 30 mg by mouth 2 (two) times daily.   Yes [provider]  metoprolol succinate (TOPROL-XL) 50 MG 24 hr tablet Take 1 tablet (50 mg total) by mouth daily. 05/18/21  Yes Nicole Kindred A, DO  midodrine (PROAMATINE) 10 MG tablet Take 10 mg by mouth 3 (three) times a week. Tuesday, Thursday, and Saturday 07/01/21  Yes [provider]  omega-3 acid ethyl esters (LOVAZA) 1 g capsule Take 1 g by mouth 2 (two) times daily.   Yes [provider]  pantoprazole (PROTONIX) 40 MG tablet Take 1 tablet (40 mg total) by mouth 2 (two) times daily. 05/18/21  Yes Nicole Kindred A, DO  predniSONE (DELTASONE) 20 MG tablet Take 3 tablets (60 mg total) by mouth daily with breakfast. Patient taking differently: Take 60 mg by mouth daily with breakfast. Titrate down 1 tablet weekly till taking 1 tablet (20 mg) by mouth daily. 05/01/21  Yes Annita Brod, MD  Pyridoxine HCl (VITAMIN B6 PO) Take 1 capsule by mouth daily.   Yes [provider]  QUEtiapine (SEROQUEL) 100 MG tablet Take 1 tablet (100 mg total) by mouth at bedtime. 04/30/21  Yes Annita Brod, MD  sulfamethoxazole-trimethoprim (BACTRIM DS) 800-160 MG tablet Take 1 tablet by mouth 3 (three) times a week. 05/03/21  Yes Annita Brod, MD  thiamine  250 MG tablet Take 500 mg by mouth daily.   Yes [provider]  VITAMIN E PO Take 1 Capful by mouth daily.   Yes [provider]    Inpatient Medications: Scheduled Meds:  acetaminophen  650 mg Oral Q6H   Or   acetaminophen  650 mg Rectal Q6H   calcium-vitamin D  1 tablet Oral Q breakfast   metoprolol tartrate  25 mg Oral Q6H   [START ON 07/05/2021] midodrine  10 mg Oral Once per day on Mon Wed Fri   omega-3 acid ethyl esters  1 g Oral BID   oxyCODONE  2.5 mg Oral Q6H   pantoprazole  40 mg Oral BID   [START ON 07/08/2021] predniSONE  20 mg Oral Q breakfast   predniSONE  30 mg Oral Q breakfast   QUEtiapine  100 mg Oral QHS   [START ON 07/05/2021] sulfamethoxazole-trimethoprim  1 tablet Oral Once per day on Mon Wed Fri   vitamin C  125 mg Oral Daily  Continuous Infusions:  PRN Meds: fentaNYL (SUBLIMAZE) injection, polyethylene glycol  Allergies:    Allergies  Allergen Reactions   Codeine Other (See Comments)    Joints hurt  Other reaction(s): Other (See Comments) Joints hurt  Other reaction(s): Other (See Comments) Joints hurt  Joints hurt     Citalopram Other (See Comments)    agitation   Gabapentin Other (See Comments)    agitation   Lyrica [Pregabalin] Other (See Comments)    agitation    Social History:   Social History   Socioeconomic History   Marital status: Married    Spouse name: Not on file   Number of children: Not on file   Years of education: Not on file   Highest education level: Not on file  Occupational History   Occupation: Chief Financial Officer  Tobacco Use   Smoking status: Former    Packs/day: 1.00    Years: 30.00    Total pack years: 30.00    Types: Cigarettes    Quit date: 01/24/2002    Years since quitting: 19.4   Smokeless tobacco: Never  Vaping Use   Vaping Use: Never used  Substance and Sexual Activity   Alcohol use: No   Drug use: No   Sexual activity: Not on file  Other Topics Concern   Not on file   Social History Narrative   Not on file   Social Determinants of Health   Financial Resource Strain: Not on file  Food Insecurity: Not on file  Transportation Needs: Not on file  Physical Activity: Not on file  Stress: Not on file  Social Connections: Not on file  Intimate Partner Violence: Not on file    Family History:    Family History  Problem Relation Age of Onset   Heart attack Mother 29   Pulmonary fibrosis Father    Congestive Heart Failure Brother    Heart attack Brother    Heart attack Brother    Early death Sister    Kidney disease Paternal Grandfather    Diabetes Sister      ROS:  Please see the history of present illness.  Constitutional: Negative for chills, fever, night sweats, unintentional weight loss  HENT: Negative for ear pain and hearing loss.   Eyes: Negative for loss of vision and eye pain.  Respiratory: Negative for cough, sputum, wheezing.   Cardiovascular: See HPI. Gastrointestinal: Negative for abdominal pain, melena, and hematochezia.  Genitourinary: Negative for dysuria and hematuria.  Musculoskeletal: Negative for falls and myalgias.  Skin: Negative for itching and rash.  Neurological: Negative for focal weakness, focal sensory changes and loss of consciousness.  Endo/Heme/Allergies: Does bruise/bleed easily.   All other ROS reviewed and negative.     Physical Exam/Data:   Vitals:   07/04/21 1125 07/04/21 1200 07/04/21 1225 07/04/21 1324  BP: 108/89  (!) 119/99 112/79  Pulse: (!) 145 (!) 139 (!) 147 (!) 148  Resp: 15 15 16 20   Temp:   98.7 F (37.1 C) 98.7 F (37.1 C)  TempSrc:      SpO2: 95% 95% 96% 96%  Weight:      Height:        Intake/Output Summary (Last 24 hours) at 07/04/2021 1358 Last data filed at 07/04/2021 0800 Gross per 24 hour  Intake 120 ml  Output --  Net 120 ml      07/04/2021    3:30 AM 07/03/2021    4:14 PM 06/07/2021   10:37 AM  Last 3 Weights  Weight (lbs) 210 lb 5.1 oz 200 lb 200 lb  Weight (kg)  95.4 kg 90.719 kg 90.719 kg     Body mass index is 29.33 kg/m.  General:  Well nourished, well developed, in no acute distress HEENT: normal Neck: JVD not visualized but patient cannot change position for better assessment Vascular: No carotid bruits; Distal pulses 2+ bilaterally Cardiac:  normal S1, S2; RRR; no murmur  Lungs:  clear to auscultation bilaterally, no wheezing, rhonchi or rales  Abd: soft, nontender, no hepatomegaly  Ext: trivial LE edema Musculoskeletal:  No deformities, BUE and BLE strength normal and equal Skin: warm and dry  Neuro:  CNs 2-12 intact, no focal abnormalities noted Psych:  Normal affect   EKG:  The EKG was personally reviewed and demonstrates:  afib RVR at 143 bpm Telemetry:  Telemetry was personally reviewed and demonstrates:  afib RVR 130s-150s  Relevant CV Studies: Echo 04/28/21 1. Left ventricular ejection fraction, by estimation, is 40 to 45%. The  left ventricle has mild to moderately decreased function. The left  ventricle demonstrates global hypokinesis. Left ventricular diastolic  parameters are indeterminate.   2. Right ventricular systolic function is low normal. The right  ventricular size is normal.   3. Left atrial size was mild to moderately dilated.   4. The mitral valve is normal in structure. Mild mitral valve  regurgitation.   5. The aortic valve is tricuspid. Aortic valve regurgitation is not  visualized. Aortic valve sclerosis is present, with no evidence of aortic  valve stenosis.   6. Aortic dilatation noted. There is mild dilatation of the aortic root,  measuring 43 mm.   7. The inferior vena cava is normal in size with greater than 50%  respiratory variability, suggesting right atrial pressure of 3 mmHg.   cMRI 06/14/19 IMPRESSION: 1.  Mildly dilated LV with EF 25%, diffuse hypokinesis.   2.  Normal RV size with mild-moderate hypokinesis, EF 36%.   3. Non-coronary disease pattern LGE, possibly suggestive of prior viral  myocarditis.  R/LHC 04/30/19 Prox RCA lesion is 90% stenosed. 1st Mrg lesion is 20% stenosed. Prox LAD lesion is 30% stenosed.   Nonischemic dilated cardiomyopathy with mild 30% irregularity in the proximal LAD; normal small ramus immediate vessel; very large dominant left circumflex vessel supplying the entire inferior inferolateral and posterior lateral wall with 20% mild stenosis in the first marginal branch 30% in the second marginal branch; and very small nondominant RCA 90% mid stenosis the vessel size less than 2.0 mm.   Mild right heart pressure elevation with mild pulmonary hypertension with mean PA pressure at 30 mm.   RECOMMENDATION: Guideline directed medical therapy for dominant nonischemic cardiomyopathy with ultimate initiation of beta blockade, strong blockade, and eventual ARNI therapy.  Medical therapy for focal mid RCA stenosis in the very small caliber vessel.  Laboratory Data:  High Sensitivity Troponin:  No results for input(s): "TROPONINIHS" in the last 720 hours.   Chemistry Recent Labs  Lab 07/03/21 1620 07/04/21 0553  NA 138 138  K 4.7 5.1  CL 97* 98  CO2 24 25  GLUCOSE 137* 117*  BUN 74* 80*  CREATININE 4.67* 5.36*  CALCIUM 8.1* 8.2*  GFRNONAA 13* 11*  ANIONGAP 17* 15    Recent Labs  Lab 07/03/21 1620 07/04/21 0553  PROT 5.7*  --   ALBUMIN 3.1* 3.0*  AST 24  --   ALT 31  --   ALKPHOS 39  --   BILITOT 0.7  --  Lipids  Recent Labs  Lab 07/04/21 0553  CHOL 200  TRIG 197*  HDL 37*  LDLCALC 124*  CHOLHDL 5.4    Hematology Recent Labs  Lab 07/03/21 1620 07/04/21 0553  WBC 7.3 7.4  RBC 2.66* 2.65*  HGB 8.8* 8.9*  HCT 27.3* 27.1*  MCV 102.6* 102.3*  MCH 33.1 33.6  MCHC 32.2 32.8  RDW 17.1* 17.1*  PLT 145* 133*   Thyroid  Recent Labs  Lab 07/04/21 0553  TSH 12.911*    BNPNo results for input(s): "BNP", "PROBNP" in the last 168 hours.  DDimer No results for input(s): "DDIMER" in the last 168 hours.   Radiology/Studies:   CT Head Wo Contrast  Result Date: 07/03/2021 CLINICAL DATA:  [place] status change of unknown cause. EXAM: CT HEAD WITHOUT CONTRAST CT CERVICAL SPINE WITHOUT CONTRAST TECHNIQUE: Multidetector CT imaging of the head and cervical spine was performed following the standard protocol without intravenous contrast. Multiplanar CT image reconstructions of the cervical spine were also generated. RADIATION DOSE REDUCTION: This exam was performed according to the departmental dose-optimization program which includes automated exposure control, adjustment of the mA and/or kV according to patient size and/or use of iterative reconstruction technique. COMPARISON:  09/17/2015 FINDINGS: CT HEAD FINDINGS Brain: No evidence of acute infarction, hemorrhage, hydrocephalus, extra-axial collection or mass lesion/mass effect. Mild ventricular sulcal enlargement reflecting age-appropriate volume loss. Mild periventricular white matter hypoattenuation is also present consistent with chronic microvascular ischemic change. Vascular: No hyperdense vessel or unexpected calcification. Skull: Normal. Negative for fracture or focal lesion. Sinuses/Orbits: Globes and orbits are unremarkable. Visualized sinuses are clear. Other: None. CT CERVICAL SPINE FINDINGS Alignment: Grade 1 anterolisthesis, C7 on T1 and C2 on C3, both degenerative. No other spondylolisthesis. Reversed cervical lordosis, apex at C3-C4. Skull base and vertebrae: No acute fracture. No primary bone lesion or focal pathologic process. Soft tissues and spinal canal: No prevertebral fluid or swelling. No visible canal hematoma. Disc levels: Mild to moderate loss of disc height at C2-C3. Marked loss of disc height at C3-C4, C4-C5, C5-C6 and C6-C7. Moderate loss of disc height at C7-T1. No evidence of a disc herniation. Upper chest: No acute findings. Other: None. IMPRESSION: HEAD CT 1. No acute intracranial abnormalities. 2. Age-appropriate volume loss and mild chronic microvascular  ischemic change. CERVICAL CT 1. No fracture or acute finding. 2. Degenerative changes as detailed. Electronically Signed   By: Lajean Manes M.D.   On: 07/03/2021 17:40   CT Cervical Spine Wo Contrast  Result Date: 07/03/2021 CLINICAL DATA:  [place] status change of unknown cause. EXAM: CT HEAD WITHOUT CONTRAST CT CERVICAL SPINE WITHOUT CONTRAST TECHNIQUE: Multidetector CT imaging of the head and cervical spine was performed following the standard protocol without intravenous contrast. Multiplanar CT image reconstructions of the cervical spine were also generated. RADIATION DOSE REDUCTION: This exam was performed according to the departmental dose-optimization program which includes automated exposure control, adjustment of the mA and/or kV according to patient size and/or use of iterative reconstruction technique. COMPARISON:  09/17/2015 FINDINGS: CT HEAD FINDINGS Brain: No evidence of acute infarction, hemorrhage, hydrocephalus, extra-axial collection or mass lesion/mass effect. Mild ventricular sulcal enlargement reflecting age-appropriate volume loss. Mild periventricular white matter hypoattenuation is also present consistent with chronic microvascular ischemic change. Vascular: No hyperdense vessel or unexpected calcification. Skull: Normal. Negative for fracture or focal lesion. Sinuses/Orbits: Globes and orbits are unremarkable. Visualized sinuses are clear. Other: None. CT CERVICAL SPINE FINDINGS Alignment: Grade 1 anterolisthesis, C7 on T1 and C2 on C3, both degenerative.  No other spondylolisthesis. Reversed cervical lordosis, apex at C3-C4. Skull base and vertebrae: No acute fracture. No primary bone lesion or focal pathologic process. Soft tissues and spinal canal: No prevertebral fluid or swelling. No visible canal hematoma. Disc levels: Mild to moderate loss of disc height at C2-C3. Marked loss of disc height at C3-C4, C4-C5, C5-C6 and C6-C7. Moderate loss of disc height at C7-T1. No evidence of a  disc herniation. Upper chest: No acute findings. Other: None. IMPRESSION: HEAD CT 1. No acute intracranial abnormalities. 2. Age-appropriate volume loss and mild chronic microvascular ischemic change. CERVICAL CT 1. No fracture or acute finding. 2. Degenerative changes as detailed. Electronically Signed   By: Lajean Manes M.D.   On: 07/03/2021 17:40   DG Hip Unilat W or Wo Pelvis 2-3 Views Right  Result Date: 07/03/2021 CLINICAL DATA:  Trauma, fall EXAM: DG HIP (WITH OR WITHOUT PELVIS) 2-3V RIGHT COMPARISON:  None Available. FINDINGS: There is comminuted fracture in the neck of right femur. There is slight superior displacement of distal major fracture fragment. There is no dislocation. IMPRESSION: Comminuted, displaced fracture is seen in the neck of proximal right femur. Electronically Signed   By: Elmer Picker M.D.   On: 07/03/2021 16:41   DG Chest 1 View  Result Date: 07/03/2021 CLINICAL DATA:  Confusion and fall EXAM: CHEST  1 VIEW COMPARISON:  06/04/2021 FINDINGS: Cardiomegaly. Large-bore right neck multi lumen vascular catheter. Unchanged scarring and or atelectasis of the left lung base. The visualized skeletal structures are unremarkable. IMPRESSION: Cardiomegaly without acute abnormality of the lungs in AP portable projection. Electronically Signed   By: Delanna Ahmadi M.D.   On: 07/03/2021 16:41     Assessment and Plan:   Preoperative cardiovascular evaluation Pending OR for displaced and comminuted femoral neck fracture, tentatively planned for tomorrow. Full ortho consult not yet available, unclear plans for anesthesia. Given comorbidities, would prefer not to use general anesthesia if this can be avoided, but defer to surgical team on necessity.  According to the Revised Cardiac Risk Index (RCRI), his RCRI is 2, with perioperative risk of 6.6%.  The patient is not currently having active cardiac symptoms. He has had extensive cardiac workup in the past. Do not recommend  additional cardiac evaluation prior to surgery.   Nonischemic cardiomyopathy -fluid management by dialysis -no ACEi/ARB/ARNI/MRA given ESRD -changing metoprolol as below  Chronic atrial fibrillation, chronically in RVR -CHA2DS2/VAS Stroke Risk Points=5 -not anticoagulated due to recent GI bleed. Hgb at that time was 4.1, required multiple units (estimated 9-10 units by chart review) of PRBC. EGD showed 1 cratered nonbleeding duodenal ulcer. Colonoscopy showd diverticulosis and external hemorrhoids -he reports that he always runs 130s-150s, but on chart review this does not appear to be the case -will gradually increase metoprolol. Was on metoprolol succinate 50 mg daily at home. Will increase to 25 mg q6 hours tartrate, may need further uptitration. He states that his pain is well controlled, so I don't think this is all pain response at this time  Hypotension, chronic -on midodrine prior to dialysis  Anemia, with recent GI bleed OSA Type II diabetes ESRD on HD ANCA vasculitis -per family medicine team and nephrology  Cardiology will follow up post op to adjust metoprolol for rate control.  Risk Assessment/Risk Scores:      For questions or updates, please contact [place] Please consult [email or web address] for contact info under    Signed, Buford Dresser, MD  07/04/2021 1:58 PM

## 2021-07-05 ENCOUNTER — Inpatient Hospital Stay (HOSPITAL_COMMUNITY): Payer: PPO

## 2021-07-05 ENCOUNTER — Inpatient Hospital Stay (HOSPITAL_COMMUNITY): Payer: PPO | Admitting: Anesthesiology

## 2021-07-05 ENCOUNTER — Encounter (HOSPITAL_COMMUNITY)
Admission: EM | Disposition: A | Discharge status: Skilled Nursing Facility | Payer: Self-pay | Source: Home / Self Care | Attending: Family Medicine

## 2021-07-05 DIAGNOSIS — I2510 Atherosclerotic heart disease of native coronary artery without angina pectoris: Secondary | ICD-10-CM

## 2021-07-05 DIAGNOSIS — I132 Hypertensive heart and chronic kidney disease with heart failure and with stage 5 chronic kidney disease, or end stage renal disease: Secondary | ICD-10-CM

## 2021-07-05 DIAGNOSIS — S72001A Fracture of unspecified part of neck of right femur, initial encounter for closed fracture: Secondary | ICD-10-CM

## 2021-07-05 DIAGNOSIS — I509 Heart failure, unspecified: Secondary | ICD-10-CM

## 2021-07-05 HISTORY — PX: HIP ARTHROPLASTY: SHX981

## 2021-07-05 LAB — RENAL FUNCTION PANEL
Albumin: 2.7 g/dL — ABNORMAL LOW (ref 3.5–5.0)
Anion gap: 15 (ref 5–15)
BUN: 94 mg/dL — ABNORMAL HIGH (ref 8–23)
CO2: 22 mmol/L (ref 22–32)
Calcium: 8 mg/dL — ABNORMAL LOW (ref 8.9–10.3)
Chloride: 99 mmol/L (ref 98–111)
Creatinine, Ser: 6.16 mg/dL — ABNORMAL HIGH (ref 0.61–1.24)
GFR, Estimated: 9 mL/min — ABNORMAL LOW (ref >60)
Glucose, Bld: 109 mg/dL — ABNORMAL HIGH (ref 70–99)
Phosphorus: 9 mg/dL — ABNORMAL HIGH (ref 2.5–4.6)
Potassium: 5 mmol/L (ref 3.5–5.1)
Sodium: 136 mmol/L (ref 135–145)

## 2021-07-05 LAB — CBC
HCT: 24.8 % — ABNORMAL LOW (ref 39.0–52.0)
Hemoglobin: 8.1 g/dL — ABNORMAL LOW (ref 13.0–17.0)
MCH: 33.1 pg (ref 26.0–34.0)
MCHC: 32.7 g/dL (ref 30.0–36.0)
MCV: 101.2 fL — ABNORMAL HIGH (ref 80.0–100.0)
Platelets: 134 10*3/uL — ABNORMAL LOW (ref 150–400)
RBC: 2.45 MIL/uL — ABNORMAL LOW (ref 4.22–5.81)
RDW: 16.6 % — ABNORMAL HIGH (ref 11.5–15.5)
WBC: 5.5 10*3/uL (ref 4.0–10.5)
nRBC: 1.4 % — ABNORMAL HIGH (ref 0.0–0.2)

## 2021-07-05 LAB — PREPARE RBC (CROSSMATCH)

## 2021-07-05 LAB — HEPATITIS B SURFACE ANTIGEN: Hepatitis B Surface Ag: NONREACTIVE

## 2021-07-05 LAB — HEPATITIS B CORE ANTIBODY, TOTAL: Hep B Core Total Ab: NONREACTIVE

## 2021-07-05 LAB — HEPATITIS B SURFACE ANTIBODY,QUALITATIVE: Hep B S Ab: NONREACTIVE

## 2021-07-05 LAB — GLUCOSE, CAPILLARY: Glucose-Capillary: 187 mg/dL — ABNORMAL HIGH (ref 70–99)

## 2021-07-05 LAB — HEPATITIS C ANTIBODY: HCV Ab: NONREACTIVE

## 2021-07-05 SURGERY — HEMIARTHROPLASTY, HIP, DIRECT ANTERIOR APPROACH, FOR FRACTURE
Anesthesia: General | Site: Hip | Laterality: Right

## 2021-07-05 MED ORDER — POVIDONE-IODINE 10 % EX SWAB: 2.0000 "application " | Freq: Once | CUTANEOUS | Status: DC

## 2021-07-05 MED ORDER — PHENYLEPHRINE 80 MCG/ML (10ML) SYRINGE FOR IV PUSH (FOR BLOOD PRESSURE SUPPORT)
PREFILLED_SYRINGE | INTRAVENOUS | Status: DC | PRN
  Administered 2021-07-05 (×2): 80 ug via INTRAVENOUS

## 2021-07-05 MED ORDER — BUPIVACAINE LIPOSOME 1.3 % IJ SUSP
INTRAMUSCULAR | Status: DC | PRN
  Administered 2021-07-05: 20 mL

## 2021-07-05 MED ORDER — SUCCINYLCHOLINE CHLORIDE 200 MG/10ML IV SOSY
PREFILLED_SYRINGE | INTRAVENOUS | Status: DC | PRN
  Administered 2021-07-05: 120 mg via INTRAVENOUS

## 2021-07-05 MED ORDER — CEFAZOLIN SODIUM-DEXTROSE 2-4 GM/100ML-% IV SOLN
INTRAVENOUS | Status: AC
  Filled 2021-07-05: qty 100

## 2021-07-05 MED ORDER — ALBUMIN HUMAN 5 % IV SOLN: INTRAVENOUS | Status: DC | PRN

## 2021-07-05 MED ORDER — CEFAZOLIN SODIUM-DEXTROSE 2-4 GM/100ML-% IV SOLN
2.0000 g | INTRAVENOUS | Status: AC
  Administered 2021-07-05: 2 g via INTRAVENOUS

## 2021-07-05 MED ORDER — OXYCODONE HCL 5 MG PO TABS: 5.0000 mg | ORAL_TABLET | Freq: Once | ORAL | Status: DC | PRN

## 2021-07-05 MED ORDER — FENTANYL CITRATE (PF) 250 MCG/5ML IJ SOLN
INTRAMUSCULAR | Status: AC
  Filled 2021-07-05: qty 5

## 2021-07-05 MED ORDER — ONDANSETRON HCL 4 MG/2ML IJ SOLN
INTRAMUSCULAR | Status: DC | PRN
  Administered 2021-07-05: 4 mg via INTRAVENOUS

## 2021-07-05 MED ORDER — PROPOFOL 10 MG/ML IV BOLUS
INTRAVENOUS | Status: DC | PRN
  Administered 2021-07-05: 70 mg via INTRAVENOUS

## 2021-07-05 MED ORDER — ONDANSETRON HCL 4 MG/2ML IJ SOLN
INTRAMUSCULAR | Status: AC
  Filled 2021-07-05: qty 2

## 2021-07-05 MED ORDER — CEFADROXIL 500 MG PO CAPS
500.0000 mg | ORAL_CAPSULE | Freq: Every day | ORAL | Status: DC
  Administered 2021-07-06 – 2021-07-08 (×3): 500 mg via ORAL
  Filled 2021-07-05 (×5): qty 1

## 2021-07-05 MED ORDER — SODIUM CHLORIDE 0.9 % IV SOLN: 10.0000 mL/h | Freq: Once | INTRAVENOUS | Status: AC

## 2021-07-05 MED ORDER — VASOPRESSIN 20 UNIT/ML IV SOLN
INTRAVENOUS | Status: AC
  Filled 2021-07-05: qty 1

## 2021-07-05 MED ORDER — SODIUM CHLORIDE (PF) 0.9 % IJ SOLN
INTRAMUSCULAR | Status: DC | PRN
  Administered 2021-07-05: 60 mL

## 2021-07-05 MED ORDER — ESMOLOL HCL 100 MG/10ML IV SOLN
INTRAVENOUS | Status: DC | PRN
  Administered 2021-07-05 (×2): 10 mg via INTRAVENOUS

## 2021-07-05 MED ORDER — FENTANYL CITRATE (PF) 100 MCG/2ML IJ SOLN: 25.0000 ug | INTRAMUSCULAR | Status: DC | PRN

## 2021-07-05 MED ORDER — CHLORHEXIDINE GLUCONATE CLOTH 2 % EX PADS
6.0000 | MEDICATED_PAD | Freq: Every day | CUTANEOUS | Status: DC
  Administered 2021-07-07 – 2021-07-09 (×3): 6 via TOPICAL

## 2021-07-05 MED ORDER — VASOPRESSIN 20 UNIT/ML IV SOLN
INTRAVENOUS | Status: DC | PRN
  Administered 2021-07-05 (×2): 1 [IU] via INTRAVENOUS

## 2021-07-05 MED ORDER — SODIUM CHLORIDE 0.9 % IR SOLN
Status: DC | PRN
  Administered 2021-07-05: 1000 mL
  Administered 2021-07-05: 3000 mL

## 2021-07-05 MED ORDER — FENTANYL CITRATE (PF) 250 MCG/5ML IJ SOLN
INTRAMUSCULAR | Status: DC | PRN
  Administered 2021-07-05: 25 ug via INTRAVENOUS
  Administered 2021-07-05: 50 ug via INTRAVENOUS
  Administered 2021-07-05: 25 ug via INTRAVENOUS
  Administered 2021-07-05: 50 ug via INTRAVENOUS

## 2021-07-05 MED ORDER — OXYCODONE HCL 5 MG/5ML PO SOLN: 5.0000 mg | Freq: Once | ORAL | Status: DC | PRN

## 2021-07-05 MED ORDER — TRANEXAMIC ACID-NACL 1000-0.7 MG/100ML-% IV SOLN
INTRAVENOUS | Status: AC
  Filled 2021-07-05: qty 100

## 2021-07-05 MED ORDER — DEXAMETHASONE SODIUM PHOSPHATE 10 MG/ML IJ SOLN
INTRAMUSCULAR | Status: AC
  Filled 2021-07-05: qty 1

## 2021-07-05 MED ORDER — CEFAZOLIN SODIUM-DEXTROSE 2-4 GM/100ML-% IV SOLN: 2.0000 g | Freq: Three times a day (TID) | INTRAVENOUS | Status: DC

## 2021-07-05 MED ORDER — MIDODRINE HCL 5 MG PO TABS: 10.0000 mg | ORAL_TABLET | ORAL | Status: DC

## 2021-07-05 MED ORDER — ROCURONIUM BROMIDE 10 MG/ML (PF) SYRINGE
PREFILLED_SYRINGE | INTRAVENOUS | Status: DC | PRN
  Administered 2021-07-05: 50 mg via INTRAVENOUS

## 2021-07-05 MED ORDER — PHENYLEPHRINE HCL-NACL 20-0.9 MG/250ML-% IV SOLN
INTRAVENOUS | Status: DC | PRN
  Administered 2021-07-05: 50 ug/min via INTRAVENOUS

## 2021-07-05 MED ORDER — PROPOFOL 10 MG/ML IV BOLUS
INTRAVENOUS | Status: AC
  Filled 2021-07-05: qty 20

## 2021-07-05 MED ORDER — LIDOCAINE 2% (20 MG/ML) 5 ML SYRINGE
INTRAMUSCULAR | Status: AC
  Filled 2021-07-05: qty 5

## 2021-07-05 MED ORDER — TRANEXAMIC ACID-NACL 1000-0.7 MG/100ML-% IV SOLN
1000.0000 mg | INTRAVENOUS | Status: AC
  Administered 2021-07-05: 1000 mg via INTRAVENOUS

## 2021-07-05 MED ORDER — DEXAMETHASONE SODIUM PHOSPHATE 10 MG/ML IJ SOLN
INTRAMUSCULAR | Status: DC | PRN
  Administered 2021-07-05: 10 mg via INTRAVENOUS

## 2021-07-05 MED ORDER — ESMOLOL HCL 100 MG/10ML IV SOLN
INTRAVENOUS | Status: AC
  Filled 2021-07-05: qty 10

## 2021-07-05 MED ORDER — 0.9 % SODIUM CHLORIDE (POUR BTL) OPTIME
TOPICAL | Status: DC | PRN
  Administered 2021-07-05: 1000 mL

## 2021-07-05 MED ORDER — ROCURONIUM BROMIDE 10 MG/ML (PF) SYRINGE
PREFILLED_SYRINGE | INTRAVENOUS | Status: AC
Start: 2021-07-05 — End: ?
  Filled 2021-07-05: qty 10

## 2021-07-05 MED ORDER — LIDOCAINE 2% (20 MG/ML) 5 ML SYRINGE
INTRAMUSCULAR | Status: DC | PRN
  Administered 2021-07-05: 60 mg via INTRAVENOUS

## 2021-07-05 MED ORDER — BUPIVACAINE LIPOSOME 1.3 % IJ SUSP
INTRAMUSCULAR | Status: AC
Start: 2021-07-05 — End: ?
  Filled 2021-07-05: qty 20

## 2021-07-05 MED ORDER — CHLORHEXIDINE GLUCONATE 4 % EX LIQD: 60.0000 mL | Freq: Once | CUTANEOUS | Status: DC

## 2021-07-05 MED ORDER — OXYCODONE HCL 5 MG PO TABS
2.5000 mg | ORAL_TABLET | ORAL | Status: DC | PRN
  Administered 2021-07-05 – 2021-07-09 (×6): 5 mg via ORAL
  Filled 2021-07-05 (×8): qty 1

## 2021-07-05 MED ORDER — SUGAMMADEX SODIUM 200 MG/2ML IV SOLN
INTRAVENOUS | Status: DC | PRN
  Administered 2021-07-05: 200 mg via INTRAVENOUS

## 2021-07-05 SURGICAL SUPPLY — 56 items
BLADE SAGITTAL 25.0X1.27X90 (BLADE) ×2 IMPLANT
BRUSH FEMORAL CANAL (MISCELLANEOUS) ×1 IMPLANT
CEMENT BONE SIMPLEX SPEEDSET (Cement) ×2 IMPLANT
CEMENT RESTRICTOR BONE PREP ST (Cement) ×1 IMPLANT
CHLORAPREP W/TINT 26 (MISCELLANEOUS) ×4 IMPLANT
COVER SURGICAL LIGHT HANDLE (MISCELLANEOUS) ×2 IMPLANT
DRAPE HALF SHEET 40X57 (DRAPES) ×2 IMPLANT
DRAPE HIP W/POCKET STRL (MISCELLANEOUS) ×2 IMPLANT
DRAPE INCISE IOBAN 66X45 STRL (DRAPES) ×2 IMPLANT
DRAPE INCISE IOBAN 85X60 (DRAPES) ×2 IMPLANT
DRAPE POUCH INSTRU U-SHP 10X18 (DRAPES) ×2 IMPLANT
DRAPE U-SHAPE 47X51 STRL (DRAPES) ×4 IMPLANT
DRSG AQUACEL AG ADV 3.5X 6 (GAUZE/BANDAGES/DRESSINGS) ×1 IMPLANT
DRSG AQUACEL AG ADV 3.5X10 (GAUZE/BANDAGES/DRESSINGS) ×2 IMPLANT
ELECT BLADE 4.0 EZ CLEAN MEGAD (MISCELLANEOUS) ×2
ELECTRODE BLDE 4.0 EZ CLN MEGD (MISCELLANEOUS) ×1 IMPLANT
GLOVE SRG 8 PF TXTR STRL LF DI (GLOVE) ×1 IMPLANT
GLOVE SURG ORTHO 8.0 STRL STRW (GLOVE) ×4 IMPLANT
GLOVE SURG UNDER POLY LF SZ8 (GLOVE) ×2
GOWN STRL REUS W/ TWL LRG LVL3 (GOWN DISPOSABLE) ×2 IMPLANT
GOWN STRL REUS W/ TWL XL LVL3 (GOWN DISPOSABLE) ×1 IMPLANT
GOWN STRL REUS W/TWL LRG LVL3 (GOWN DISPOSABLE) ×4
GOWN STRL REUS W/TWL XL LVL3 (GOWN DISPOSABLE) ×2
HANDPIECE INTERPULSE COAX TIP (DISPOSABLE) ×2
HEAD CERAMIC V40 BIOLOX DEL 28 (Orthopedic Implant) ×1 IMPLANT
HEAD FEM UNIV HIP 28X56 (Head) ×1 IMPLANT
HOOD PEEL AWAY FLYTE STAYCOOL (MISCELLANEOUS) ×6 IMPLANT
KIT BASIN OR (CUSTOM PROCEDURE TRAY) ×2 IMPLANT
KIT TURNOVER KIT A (KITS) ×2 IMPLANT
MANIFOLD NEPTUNE II (INSTRUMENTS) ×2 IMPLANT
MARKER SKIN DUAL TIP RULER LAB (MISCELLANEOUS) ×2 IMPLANT
NDL 18GX1X1/2 (RX/OR ONLY) (NEEDLE) ×1 IMPLANT
NEEDLE 18GX1X1/2 (RX/OR ONLY) (NEEDLE) ×2 IMPLANT
NS IRRIG 1000ML POUR BTL (IV SOLUTION) ×2 IMPLANT
PACK TOTAL JOINT (CUSTOM PROCEDURE TRAY) ×2 IMPLANT
PRESSURIZER FEMORAL UNIV (MISCELLANEOUS) ×1 IMPLANT
RETRIEVER SUT HEWSON (MISCELLANEOUS) ×2 IMPLANT
SEALER BIPOLAR AQUA 6.0 (INSTRUMENTS) ×2 IMPLANT
SET HNDPC FAN SPRY TIP SCT (DISPOSABLE) ×1 IMPLANT
SPONGE T-LAP 18X18 ~~LOC~~+RFID (SPONGE) ×4 IMPLANT
STEM FEM CEMT 49X158 SZ6 127D (Stem) ×1 IMPLANT
SUCTION FRAZIER HANDLE 10FR (MISCELLANEOUS) ×2
SUCTION TUBE FRAZIER 10FR DISP (MISCELLANEOUS) ×1 IMPLANT
SUT ETHIBOND 2 V 37 (SUTURE) ×2 IMPLANT
SUT MNCRL AB 3-0 PS2 18 (SUTURE) ×2 IMPLANT
SUT STRATAFIX 1PDS 45CM VIOLET (SUTURE) ×4 IMPLANT
SUT VIC AB 0 CT1 27 (SUTURE) ×2
SUT VIC AB 0 CT1 27XBRD ANBCTR (SUTURE) ×1 IMPLANT
SUT VIC AB 2-0 CT2 27 (SUTURE) ×4 IMPLANT
SYR 20ML LL LF (SYRINGE) ×2 IMPLANT
SYR 50ML LL SCALE MARK (SYRINGE) ×2 IMPLANT
TOWEL GREEN STERILE (TOWEL DISPOSABLE) ×2 IMPLANT
TOWER CARTRIDGE SMART MIX (DISPOSABLE) ×1 IMPLANT
TRAY FOLEY MTR SLVR 16FR STAT (SET/KITS/TRAYS/PACK) ×1 IMPLANT
TUBE SUCT ARGYLE STRL (TUBING) ×2 IMPLANT
WATER STERILE IRR 1000ML POUR (IV SOLUTION) ×2 IMPLANT

## 2021-07-05 NOTE — Progress Notes (Signed)
Daily Progress Note Intern Pager: [phone]  Patient name: Blake West Medical record number: [id number] Date of birth: 1950-12-21 Age: 71 y.o. Gender: West  Primary Care Provider: Leonel Ramsay, MD Consultants: Cardiology, nephrology, orthopedics Code Status: Partial code, DNI  Pt Overview and Major Events to Date:  6/10: admitted 6/12: right hip hemiarthroplasty  Assessment and Plan: Blake West is a 71 y.o. West who presented with right femoral neck fracture now status post right hip hemiarthroplasty. Pertinent PMH/PSH includes ESRD on HD, ANCA associated vasculitis, CAD, HFmrEF, NICM, chronic atrial fibrillation with RVR, HTN, thrombocytopenia, diet controlled T2DM, OSA, hypothyroidism.   * Displaced fracture of right femoral neck (HCC) Now s/p R hip hemiarthroplasty. Pain adequately controlled.  -Ortho following, appreciate recommendations -PT/OT eval and treat -Ancef x23 hours post op followed by Cefadroxil 500mg  BID x7 days, per orthopedics -Pain control with Tylenol 650 mg q6h, Oxy 2.5 mg q4h PRN, Fentanyl 50 mcg q2h for breakthrough pain -Delirium precautions (hx of this on prior admissions) -Continue Os-cal, Vitamin D supplementation  -Consider starting bisphosphate prior to d/c   ESRD (end stage renal disease) on dialysis (HCC) Stable.  HD on TTS. -Nephrology following, HD tomorrow -Daily renal function panel -Avoid nephrotoxic agents -Midodrine 3 times weekly with HD   ANCA-associated vasculitis (HCC) Currently on Prednisone daily and Bactrim 3 times weekly for PCP ppx.  -continue prednisone taper 30 mg daily, then 20 mg daily on 6/15 -Continue Bactrim MWF  Chronic atrial fibrillation with RVR ([place]) Remains in A-fib with RVR, asymptomatic.  Patient has declined aggressive rate control in the past.  Not on anticoagulation due to history of GI bleed. -Cardiology following, appreciate recommendations -tele monitoring -continue home metoprolol  tartrate 25 mg every 6 hours daily -hold anticoagulation    Heart failure with mid-range ejection fraction (HCC) Recent TTE showing EF 40 to 45%.  Appears mildly volume overloaded on exam with 2+ pitting edema.  Will defer need for diuresis to cardiology.  Hypertension associated with diabetes ([place]) Adequately controlled. - continue metoprolol tartrate 25mg  every 6 hours  Macrocytic anemia Hgb at 8.1  -CBC daily, transfusion for hemoglobin less than 8 -Monitor for signs/symptoms of bleeding. -Continue Protonix 40 mg BID  -Continue B vitamin supplementation  Type 2 diabetes mellitus ([place]) Diet controlled, monitor on labs.  OSA (obstructive sleep apnea) States that he is unable to tolerate CPAP. On 4L [place] currently with normal saturations. -Wean oxygen  -Monitor saturations overnight, may need assistance with [place] to keep sats >92%  FEN/GI: Carb modified PPx: SCDs, anticoagulation contraindicated given anemia 2/2 chronic ulcers Dispo:Pending PT recommendations .   Subjective:  Patient states his pain is better than after his fall.  He was able to eat after the procedure today.  Objective: Temp:  [97.2 F (36.2 C)-98.7 F (37.1 C)] 97.5 F (36.4 C) (06/12 1129) Pulse Rate:  [74-188] 113 (06/12 1129) Resp:  [11-23] 15 (06/12 1129) BP: (87-141)/(67-96) 112/77 (06/12 1129) SpO2:  [72 %-99 %] 94 % (06/12 1129) Physical Exam: General: Awake and alert, NAD, wife at bedside Cardiovascular: Irregularly irregular, tachycardic Respiratory: CTAB, normal WOB Extremities: 2+ pitting edema bilaterally, SCDs in place  Laboratory: Most recent CBC Lab Results  Component Value Date   WBC 5.5 07/05/2021   HGB 8.1 (L) 07/05/2021   HCT 24.8 (L) 07/05/2021   MCV 101.2 (H) 07/05/2021   PLT 134 (L) 07/05/2021   Most recent BMP    Latest Ref Rng & Units 07/05/2021  3:17 AM  BMP  Glucose 70 - 99 mg/dL 109   BUN 8 - 23 mg/dL 94   Creatinine 0.61 - 1.24 mg/dL 6.16   Sodium 135 - 145  mmol/L 136   Potassium 3.5 - 5.1 mmol/L 5.0   Chloride 98 - 111 mmol/L 99   CO2 22 - 32 mmol/L 22   Calcium 8.9 - 10.3 mg/dL 8.0    Imaging/Diagnostic Tests: DG hip right: Radiologist Impression: Status post total right hip arthroplasty without evidence of hardware failure Wells Guiles, DO 07/05/2021, 2:51 PM  PGY-1, [place] Intern pager: [phone], text pages welcome Secure chat group [place]

## 2021-07-05 NOTE — Transfer of Care (Signed)
Immediate Anesthesia Transfer of Care Note  Patient: Blake West  Procedure(s) Performed: ARTHROPLASTY BIPOLAR HIP (HEMIARTHROPLASTY) (Right: Hip)  Patient Location: PACU  Anesthesia Type:General  Level of Consciousness: drowsy and patient cooperative  Airway & Oxygen Therapy: Patient Spontanous Breathing and Patient connected to face mask oxygen  Post-op Assessment: Report given to RN and Post -op Vital signs reviewed and stable  Post vital signs: Reviewed and stable  Last Vitals:  Vitals Value Taken Time  BP 87/67 07/05/21 0957  Temp    Pulse 126 07/05/21 1000  Resp 12 07/05/21 1000  SpO2 82 % 07/05/21 1000  Vitals shown include unvalidated device data.  Last Pain:  Vitals:   07/05/21 0425  TempSrc: Oral  PainSc: 0-No pain         Complications: No notable events documented.

## 2021-07-05 NOTE — Anesthesia Preprocedure Evaluation (Signed)
Anesthesia Evaluation  Patient identified by MRN, date of birth, ID band Patient awake    Reviewed: Allergy & Precautions, H&P , NPO status , Patient's Chart, lab work & pertinent test results, reviewed documented beta blocker date and time   History of Anesthesia Complications Negative for: history of anesthetic complications  Airway Mallampati: III  TM Distance: >3 FB Neck ROM: full    Dental  (+) Dental Advidsory Given, Edentulous Upper, Edentulous Lower   Pulmonary neg shortness of breath, sleep apnea , COPD, neg recent URI, former smoker,  Recently treated for Multifocal Pneumonia 3/27-4/7, completed course of Azithromycin & Ceftriaxone, discharged home on Bactrim DS 3x per week  Chest X-ray>>There is interval placement of left IJ central venous catheter with its tip at the junction of left innominate vein and superior vena cava. There is improvement in aeration of right lower lung fields. Residual patchy infiltrates are seen in the left lower lung fields suggesting atelectasis/pneumonia. There are no signs of alveolar pulmonary edema. There is no pneumothorax  On [place]  breath sounds clear to auscultation       Cardiovascular Exercise Tolerance: Good hypertension, Pt. on medications and Pt. on home beta blockers (-) angina+ CAD and +CHF  (-) Past MI and (-) Cardiac Stents + dysrhythmias Atrial Fibrillation (-) Valvular Problems/Murmurs Rhythm:regular Rate:Normal  ANCA-associated vasculitis- he received Solu-Medrol which was changed to p.o. prednisone.  Started on rituximab   Mildly elevated Troponin, suspect demand ischemia- HS Troponin peaked at 347  Echocardiogram 04/28/21: LVEF 40-45%, indeterminate diastolic parameters, RV systolic function low normal   Neuro/Psych PSYCHIATRIC DISORDERS negative neurological ROS     GI/Hepatic negative GI ROS, Neg liver ROS,   Endo/Other  diabetes, Type 2Hypothyroidism Stress dose steroids  at present Chronic steroid use  Renal/GU Dialysis and CRFRenal diseaseHD on 6/11  negative genitourinary   Musculoskeletal Right displaced femoral fracture   Abdominal (+) + obese,   Peds  Hematology  (+) Blood dyscrasia, anemia , Lab Results      Component                Value               Date                      WBC                      5.5                 07/05/2021                HGB                      8.1 (L)             07/05/2021                HCT                      24.8 (L)            07/05/2021                MCV                      101.2 (H)           07/05/2021  PLT                      134 (L)             07/05/2021              Anesthesia Other Findings   Reproductive/Obstetrics                             Anesthesia Physical Anesthesia Plan  ASA: 3  Anesthesia Plan: General   Post-op Pain Management: Tylenol PO (pre-op)*   Induction: Intravenous  PONV Risk Score and Plan: 2 and Ondansetron and Dexamethasone  Airway Management Planned: Oral ETT  Additional Equipment: None  Intra-op Plan:   Post-operative Plan: Extubation in OR  Informed Consent: I have reviewed the patients History and Physical, chart, labs and discussed the procedure including the risks, benefits and alternatives for the proposed anesthesia with the patient or authorized representative who has indicated his/her understanding and acceptance.     Dental advisory given  Plan Discussed with: CRNA  Anesthesia Plan Comments:         Anesthesia Quick Evaluation

## 2021-07-05 NOTE — Interval H&P Note (Signed)
The patient has been re-examined, and the chart reviewed, and there have been no interval changes to the documented history and physical.    Plan for right hip hemiarthroplasty for displaced femoral neck fracture.  Patient is low demand has multiple significant comorbidities including ESRD on dialysis, anemia, heart failure,  The operative side was examined and the patient was confirmed to have. Sens DPN, SPN, TN intact, Motor EHL, ext, flex 5/5, and foot warm and well-perfused, mild distal edema.   The risks, benefits, and alternatives have been discussed at length with patient, and the patient is willing to proceed.  Right hip marked. Consent has been signed.

## 2021-07-05 NOTE — Op Note (Signed)
07/03/2021 - 07/05/2021  9:25 AM  PATIENT:  Blake West   MRN: [id number]  PRE-OPERATIVE DIAGNOSIS:  Right displaced femoral neck fracture  POST-OPERATIVE DIAGNOSIS:  Right displaced femoral neck fracture  PROCEDURE:  Procedure(s): ARTHROPLASTY BIPOLAR HIP (HEMIARTHROPLASTY)  PREOPERATIVE INDICATIONS:  Blake West is an 71 y.o. male who was admitted 07/03/2021 with a diagnosis of Displaced fracture of right femoral neck ([place]) and elected for surgical management.  The risks benefits and alternatives were discussed with the patient including but not limited to the risks of nonoperative treatment, versus surgical intervention including infection, bleeding, nerve injury, periprosthetic fracture, the need for revision surgery, dislocation, leg length discrepancy, blood clots, cardiopulmonary complications, morbidity, mortality, among others, and they were willing to proceed.  Predicted outcome is good, although there will be at least a six to nine month expected recovery.   OPERATIVE REPORT     SURGEON:  Charlies Constable, MD    ASSISTANT:  Izola Price, RNFA,  (Present throughout the entire procedure,  necessary for completion of procedure in a timely manner, assisting with retraction, instrumentation, and closure)     ANESTHESIA:  General  ESTIMATED BLOOD LOSS: 494WH    COMPLICATIONS:  None.   COMPONENTS: Cemented St[employer]ker Accolade C size 6, 127 degree neck angle, 28+0 ceramic head, 56 mm bipolar Hemi head Implant Name Type Inv. Item Serial No. Manufacturer Lot No. LRB No. Used Action  CEMENT RESTRICTOR BONE PREP ST - [id number] Cement CEMENT RESTRICTOR BONE PREP ST  [employer] [id number] Right 1 Implanted  CEMENT BONE SIMPLEX SPEEDSET - [id number] Cement CEMENT BONE SIMPLEX SPEEDSET  [employer] [id number] Right 2 Implanted  STEM FEM CEMT [id number] SZ6 127D - [id number] Stem STEM FEM CEMT [id number] SZ6 127D  [employer] [id number] Right 1 Implanted  UHR universal head Hips    [employer] [id number] Right 1 Implanted  HEAD CERAMIC V40 BIOLOX DEL 28 - [id number] Orthopedic Implant HEAD CERAMIC V40 BIOLOX DEL 28  [employer] [id number] Right 1 Implanted      PROCEDURE IN DETAIL: The patient was met in the holding area and identified.  The appropriate hip  was marked at the operative site. The patient was then transported to the OR and  placed under anesthesia.  At that point, the patient was  placed in the lateral decubitus position with the operative side up and  secured to the operating room table and all bony prominences padded. A subaxillary role was placed.    The operative lower extremity was prepped from the iliac crest to the ankle.  Sterile draping was performed.  2g of ancef and 1g TXA were given prior to incision. Time out was performed prior to incision.      A routine posterolateral approach was utilized via sharp dissection  carried down to the subcutaneous tissue.  Gross bleeders were Bovie  coagulated.  The iliotibial band was identified and incised  along the length of the skin incision.  A Charnley retractor was inserted with care to protect the sciatic nerve.  With the hip internally rotated, the short external rotators  were identified. The piriformis was tagged with #2 Ethibond, and the hip capsule released in a T-type fashion, and posterior sleeve of the capsule was also tagged.  The femoral neck was exposed, and I resected the femoral neck using the appropriate jig. This was performed at approximately a thumb's breadth above the lesser trochanter.    I then exposed the deep acetabulum, cleared out any tissue including the  ligamentum teres.    I then prepared the proximal femur using the box cutter, Charnley awl, and then sequentially broached. When going up from the 5-6 broach we were getting potted distally. A straight reamer was used to open the canal distally at which point we were able to broach the 6 stem adequately, and it also had a good  fit proximally.  A trial utilized, and I reduced the hip, leg lengths were assessed clinically and felt to be equal. The hip was then taken through a full range of motion, the hip was stable at full extension and 90 degrees external rotation without anterior subluxation. The hip was also stable in the position of sleep, and in neutral abduction up to 90 degrees flexion, and 80 degrees IR. The trial components were then removed.   We then prepared canal for cementation.  The cement restrictor was measured and inserted distally.  The canal was then irrigated with the pulse lavage and 3 L of normal saline.  2 bags of Simplex cement were prepared.  Using the cement gun the cement was inserted distally and the canal was filled.  We then pressurized the canal. The real implant was then inserted matching the patient's native anteversion of approximately 25 degrees.  We then waited for 14 minutes for the cement to be fully set.  Excess cement was removed.  A lap was placed in the acetabulum prior to cementing was also removed and the acetabulum was assessed to make sure there was no cement or bone fragments.  The hip was then reduced with the trial head again and taken through functional range of motion and found to have excellent stability. Leg lengths were restored. The real head was then impacted onto the stem and the hip was again reduced.  The capsule was then repaired with #2 Ethibond., and the piriformis was repaired to the abductor tendon.     I then irrigated the hip copiously again with pulse lavage. The wounds were injected with 20cc exparal diluted in sterile saline. The fascia and IT band was repaired with #1 stratafix, followed by 0 stratafix for the fat layer followed by 2-0 Vicryl and running 3-0 Monocryl for the skin, Dermabond was applied and an Aquacel dressing was placed.  The patient was then awakened and returned to PACU in stable and satisfactory condition. There were no  complications.  Post op recs: WB: WBAT RLE Abx: ancef x23 hours post op, cefadroxil 500BID x7 days given increased risk for infection because of dialysis, diabetes, hx of recent pneumonia Imaging: PACU xrays Dressing: Aquacel dressing to be kept intact until follow-up DVT prophylaxis: SCD's and early mobilization. Chemoprophylaxis contraindicated given patient's recent bleeding ulcer. Discussed with primary team. Follow up: 2 weeks after surgery for a wound check with Dr. Zachery Dakins at [facility].  Address: [address] [place], [place], [place] [address]  Office Phone: [phone]   Charlies Constable, MD Orthopedic Surgeon  07/05/2021 9:25 AM

## 2021-07-05 NOTE — Plan of Care (Signed)

## 2021-07-05 NOTE — Anesthesia Procedure Notes (Addendum)
Procedure Name: Intubation Date/Time: 07/05/2021 7:52 AM  Performed by: Lance Coon, CRNAPre-anesthesia Checklist: Patient identified, Emergency Drugs available, Suction available, Patient being monitored and Timeout performed Patient Re-evaluated:Patient Re-evaluated prior to induction Oxygen Delivery Method: Circle system utilized Preoxygenation: Pre-oxygenation with 100% oxygen Induction Type: IV induction Ventilation: Mask ventilation without difficulty Laryngoscope Size: Miller and 3 Grade View: Grade I Tube type: Oral Tube size: 7.5 mm Number of attempts: 1 Airway Equipment and Method: Stylet Placement Confirmation: ETT inserted through vocal cords under direct vision, positive ETCO2 and breath sounds checked- equal and bilateral Secured at: 21 cm Tube secured with: Tape Dental Injury: Teeth and Oropharynx as per pre-operative assessment

## 2021-07-05 NOTE — Progress Notes (Signed)
[facility] Progress Note   71 y.o. male CAD, h/o GIB, chronic combined diastolic and systolic CHF, HTN, DM type 2, HLD, atrial fibrillation with RVR (not on coagulation due to recent GIB on 05/11/21), and HD-dependent AKI due to ANCA vasculitis and recent start on HD in April 2023 who presented to [facility] ED on 07/03/21 after falling at home.  Xray noted right displaced femoral neck fracture and was admitted for surgical intervention.    His wife reports that he has been having a tough time with HD due to hypotension and inability to sit in recliner for 4 hours due to discomfort and agitation.  He requires midodrine prior to HD.  Dialysis Orders: Center: [facility]  on TTS  on [address] 3hr 45, EDW 93kg  Nipro  400/500 HCO3 39 T37 2/2.5 bath RIJ TC   Assessment/ Plan:    Displaced right femoral neck fracture - Ortho right hip arthroplasty on 07/05/21.  ESRD -  Will plan to continue with TTS schedule.   No absolute indication for RRT and the patient appears to be  comfortable.   Hypertension/volume  - has 1-2 + edema but RVR and low BP.  Will UF as tolerated and continue with midodrine before HD.  Anemia  - Will check iron stores and ESA dose from HD center.    Metabolic bone disease -  continue with home meds, will check a phos  Nutrition - renal diet carb, modified  ANCA vasculitis - s/p rituximab in April.  Per his wife, the biopsy revealed "a lot of scarring" and don't believe he will regain kidney function.  Continue with prednisone taper and PCP prophylaxis with Bactrim.  Atrial fibrillation with RVR - not on anticoagulation due to recent GI bleed.  Cardiology consulted for rate control prior to surgery.  Chronic combined systolic and diastolic CHF - has edema but UF limited by hypotension and A fib RVR.  Will UF as tolerated with HD and continue to use midodrine for BP support.  Hyperkalemia - dosed Lokelma and follow; HD tomorrow.   Subjective:   Hungry, but  otherwise doing well. Denies dyspnea, fever, chills, nausea   Objective:   BP 112/77 (BP Location: Left Arm)   Pulse (!) 113   Temp (!) 97.5 F (36.4 C) (Oral)   Resp 15   Ht 5\' 11"  (1.803 m)   Wt 95.4 kg   SpO2 94%   BMI 29.33 kg/m   Intake/Output Summary (Last 24 hours) at 07/05/2021 1214 Last data filed at 07/05/2021 2505 Gross per 24 hour  Intake 1315 ml  Output 900 ml  Net 415 ml   Weight change:   Physical Exam: General appearance: NAD Head: NCAT Eyes: negative scleral icterus Resp: clear to auscultation bilaterally Cardio: irregularly irregular rhythm  GI: SNDNT + BS Extremities: edema 1-2+ pretibial edema bilaterally  Access: RIJ TC  Imaging: DG HIP UNILAT W OR W/O PELVIS 2-3 VIEWS RIGHT  Result Date: 07/05/2021 CLINICAL DATA:  Postoperative. EXAM: DG HIP (WITH OR WITHOUT PELVIS) 2-3V RIGHT COMPARISON:  Pelvis and right hip radiographs 07/03/2021 FINDINGS: Interval total right hip arthroplasty. No perihardware lucency is seen to indicate hardware failure or loosening. Expected postoperative lateral hip soft tissue swelling and mild subcutaneous air. Moderate left femoroacetabular joint space narrowing. Mild-to-moderate superior pubic symphysis joint space narrowing. IMPRESSION: Status post total right hip arthroplasty without evidence of hardware failure. Electronically Signed   By: Yvonne Kendall M.D.   On: 07/05/2021 11:55  CT Head Wo Contrast  Result Date: 07/03/2021 CLINICAL DATA:  [place] status change of unknown cause. EXAM: CT HEAD WITHOUT CONTRAST CT CERVICAL SPINE WITHOUT CONTRAST TECHNIQUE: Multidetector CT imaging of the head and cervical spine was performed following the standard protocol without intravenous contrast. Multiplanar CT image reconstructions of the cervical spine were also generated. RADIATION DOSE REDUCTION: This exam was performed according to the departmental dose-optimization program which includes automated exposure control, adjustment of the  mA and/or kV according to patient size and/or use of iterative reconstruction technique. COMPARISON:  09/17/2015 FINDINGS: CT HEAD FINDINGS Brain: No evidence of acute infarction, hemorrhage, hydrocephalus, extra-axial collection or mass lesion/mass effect. Mild ventricular sulcal enlargement reflecting age-appropriate volume loss. Mild periventricular white matter hypoattenuation is also present consistent with chronic microvascular ischemic change. Vascular: No hyperdense vessel or unexpected calcification. Skull: Normal. Negative for fracture or focal lesion. Sinuses/Orbits: Globes and orbits are unremarkable. Visualized sinuses are clear. Other: None. CT CERVICAL SPINE FINDINGS Alignment: Grade 1 anterolisthesis, C7 on T1 and C2 on C3, both degenerative. No other spondylolisthesis. Reversed cervical lordosis, apex at C3-C4. Skull base and vertebrae: No acute fracture. No primary bone lesion or focal pathologic process. Soft tissues and spinal canal: No prevertebral fluid or swelling. No visible canal hematoma. Disc levels: Mild to moderate loss of disc height at C2-C3. Marked loss of disc height at C3-C4, C4-C5, C5-C6 and C6-C7. Moderate loss of disc height at C7-T1. No evidence of a disc herniation. Upper chest: No acute findings. Other: None. IMPRESSION: HEAD CT 1. No acute intracranial abnormalities. 2. Age-appropriate volume loss and mild chronic microvascular ischemic change. CERVICAL CT 1. No fracture or acute finding. 2. Degenerative changes as detailed. Electronically Signed   By: Lajean Manes M.D.   On: 07/03/2021 17:40   CT Cervical Spine Wo Contrast  Result Date: 07/03/2021 CLINICAL DATA:  [place] status change of unknown cause. EXAM: CT HEAD WITHOUT CONTRAST CT CERVICAL SPINE WITHOUT CONTRAST TECHNIQUE: Multidetector CT imaging of the head and cervical spine was performed following the standard protocol without intravenous contrast. Multiplanar CT image reconstructions of the cervical spine were  also generated. RADIATION DOSE REDUCTION: This exam was performed according to the departmental dose-optimization program which includes automated exposure control, adjustment of the mA and/or kV according to patient size and/or use of iterative reconstruction technique. COMPARISON:  09/17/2015 FINDINGS: CT HEAD FINDINGS Brain: No evidence of acute infarction, hemorrhage, hydrocephalus, extra-axial collection or mass lesion/mass effect. Mild ventricular sulcal enlargement reflecting age-appropriate volume loss. Mild periventricular white matter hypoattenuation is also present consistent with chronic microvascular ischemic change. Vascular: No hyperdense vessel or unexpected calcification. Skull: Normal. Negative for fracture or focal lesion. Sinuses/Orbits: Globes and orbits are unremarkable. Visualized sinuses are clear. Other: None. CT CERVICAL SPINE FINDINGS Alignment: Grade 1 anterolisthesis, C7 on T1 and C2 on C3, both degenerative. No other spondylolisthesis. Reversed cervical lordosis, apex at C3-C4. Skull base and vertebrae: No acute fracture. No primary bone lesion or focal pathologic process. Soft tissues and spinal canal: No prevertebral fluid or swelling. No visible canal hematoma. Disc levels: Mild to moderate loss of disc height at C2-C3. Marked loss of disc height at C3-C4, C4-C5, C5-C6 and C6-C7. Moderate loss of disc height at C7-T1. No evidence of a disc herniation. Upper chest: No acute findings. Other: None. IMPRESSION: HEAD CT 1. No acute intracranial abnormalities. 2. Age-appropriate volume loss and mild chronic microvascular ischemic change. CERVICAL CT 1. No fracture or acute finding. 2. Degenerative changes as detailed. Electronically Signed  By: Lajean Manes M.D.   On: 07/03/2021 17:40   DG Hip Unilat W or Wo Pelvis 2-3 Views Right  Result Date: 07/03/2021 CLINICAL DATA:  Trauma, fall EXAM: DG HIP (WITH OR WITHOUT PELVIS) 2-3V RIGHT COMPARISON:  None Available. FINDINGS: There is  comminuted fracture in the neck of right femur. There is slight superior displacement of distal major fracture fragment. There is no dislocation. IMPRESSION: Comminuted, displaced fracture is seen in the neck of proximal right femur. Electronically Signed   By: Elmer Picker M.D.   On: 07/03/2021 16:41   DG Chest 1 View  Result Date: 07/03/2021 CLINICAL DATA:  Confusion and fall EXAM: CHEST  1 VIEW COMPARISON:  06/04/2021 FINDINGS: Cardiomegaly. Large-bore right neck multi lumen vascular catheter. Unchanged scarring and or atelectasis of the left lung base. The visualized skeletal structures are unremarkable. IMPRESSION: Cardiomegaly without acute abnormality of the lungs in AP portable projection. Electronically Signed   By: Delanna Ahmadi M.D.   On: 07/03/2021 16:41    Labs: BMET Recent Labs  Lab 07/03/21 1620 07/04/21 0553 07/04/21 2219 07/05/21 0317  NA 138 138 135 136  K 4.7 5.1 5.2* 5.0  CL 97* 98 96* 99  CO2 24 25 23 22   GLUCOSE 137* 117* 157* 109*  BUN 74* 80* 92* 94*  CREATININE 4.67* 5.36* 6.05* 6.16*  CALCIUM 8.1* 8.2* 8.3* 8.0*  PHOS  --  7.1*  --  9.0*   CBC Recent Labs  Lab 07/03/21 1620 07/04/21 0553 07/05/21 0317  WBC 7.3 7.4 5.5  NEUTROABS 5.3  --   --   HGB 8.8* 8.9* 8.1*  HCT 27.3* 27.1* 24.8*  MCV 102.6* 102.3* 101.2*  PLT 145* 133* 134*    Medications:     acetaminophen  650 mg Oral Q6H   Or   acetaminophen  650 mg Rectal Q6H   calcium-vitamin D  1 tablet Oral Q breakfast   [START ON 07/06/2021] cefadroxil  500 mg Oral Daily   Chlorhexidine Gluconate Cloth  6 each Topical Daily   metoprolol tartrate  25 mg Oral Q6H   midodrine  10 mg Oral Once per day on Mon Wed Fri   omega-3 acid ethyl esters  1 g Oral BID   pantoprazole  40 mg Oral BID   [START ON 07/08/2021] predniSONE  20 mg Oral Q breakfast   predniSONE  30 mg Oral Q breakfast   QUEtiapine  100 mg Oral QHS   sodium zirconium cyclosilicate  10 g Oral Daily    sulfamethoxazole-trimethoprim  1 tablet Oral Once per day on Mon Wed Fri   vitamin C  125 mg Oral Daily      Otelia Santee, MD 07/05/2021, 12:14 PM

## 2021-07-05 NOTE — Progress Notes (Signed)
FPTS Brief Progress Note  S: Sleeping   O: BP 103/82   Pulse (!) 118   Temp 97.6 F (36.4 C) (Oral)   Resp 15   Ht 5\' 11"  (1.803 m)   Wt 95.4 kg   SpO2 98%   BMI 29.33 kg/m     A/P: Afib with RVR - HR in acceptable range of 110s-120s presently, will continue to monitor closely - Orders reviewed. Labs for AM ordered, which was adjusted as needed.   Gladys Damme, MD 07/05/2021, 2:42 AM PGY-3, [facility] Night Resident  Please page [phone] with questions.

## 2021-07-05 NOTE — Discharge Instructions (Signed)
INSTRUCTIONS AFTER JOINT REPLACEMENT   Remove items at home which could result in a fall. This includes throw rugs or furniture in walking pathways ICE to the affected joint every three hours while awake for 30 minutes at a time, for at least the first 3-5 days, and then as needed for pain and swelling.  Continue to use ice for pain and swelling. You may notice swelling that will progress down to the foot and ankle.  This is normal after surgery.  Elevate your leg when you are not up walking on it.   Continue to use the breathing machine you got in the hospital (incentive spirometer) which will help keep your temperature down.  It is common for your temperature to cycle up and down following surgery, especially at night when you are not up moving around and exerting yourself.  The breathing machine keeps your lungs expanded and your temperature down.   DIET:  As you were doing prior to hospitalization, we recommend a well-balanced diet.  DRESSING / WOUND CARE / SHOWERING  Keep the surgical dressing until follow up.  The dressing is water proof, so you can shower without any extra covering.  IF THE DRESSING FALLS OFF or the wound gets wet inside, change the dressing with sterile gauze.  Please use good hand washing techniques before changing the dressing.  Do not use any lotions or creams on the incision until instructed by your surgeon.    ACTIVITY  Increase activity slowly as tolerated, but follow the weight bearing instructions below.   No driving for 6 weeks or until further direction given by your physician.  You cannot drive while taking narcotics.  No lifting or carrying greater than 10 lbs. until further directed by your surgeon. Avoid periods of inactivity such as sitting longer than an hour when not asleep. This helps prevent blood clots.  You may return to work once you are authorized by your doctor.     WEIGHT BEARING   Weight bearing as tolerated with assist device (walker, cane,  etc) as directed, use it as long as suggested by your surgeon or therapist, typically at least 4-6 weeks.   EXERCISES  Results after joint replacement surgery are often greatly improved when you follow the exercise, range of motion and muscle strengthening exercises prescribed by your doctor. Safety measures are also important to protect the joint from further injury. Any time any of these exercises cause you to have increased pain or swelling, decrease what you are doing until you are comfortable again and then slowly increase them. If you have problems or questions, call your caregiver or physical therapist for advice.   Rehabilitation is important following a joint replacement. After just a few days of immobilization, the muscles of the leg can become weakened and shrink (atrophy).  These exercises are designed to build up the tone and strength of the thigh and leg muscles and to improve motion. Often times heat used for twenty to thirty minutes before working out will loosen up your tissues and help with improving the range of motion but do not use heat for the first two weeks following surgery (sometimes heat can increase post-operative swelling).   These exercises can be done on a training (exercise) mat, on the floor, on a table or on a bed. Use whatever works the best and is most comfortable for you.    Use music or television while you are exercising so that the exercises are a pleasant break in your   day. This will make your life better with the exercises acting as a break in your routine that you can look forward to.   Perform all exercises about fifteen times, three times per day or as directed.  You should exercise both the operative leg and the other leg as well.  Exercises include:   Quad Sets - Tighten up the muscle on the front of the thigh (Quad) and hold for 5-10 seconds.   Straight Leg Raises - With your knee straight (if you were given a brace, keep it on), lift the leg to 60  degrees, hold for 3 seconds, and slowly lower the leg.  Perform this exercise against resistance later as your leg gets stronger.  Leg Slides: Lying on your back, slowly slide your foot toward your buttocks, bending your knee up off the floor (only go as far as is comfortable). Then slowly slide your foot back down until your leg is flat on the floor again.  Angel Wings: Lying on your back spread your legs to the side as far apart as you can without causing discomfort.  Hamstring Strength:  Lying on your back, push your heel against the floor with your leg straight by tightening up the muscles of your buttocks.  Repeat, but this time bend your knee to a comfortable angle, and push your heel against the floor.  You may put a pillow under the heel to make it more comfortable if necessary.   A rehabilitation program following joint replacement surgery can speed recovery and prevent re-injury in the future due to weakened muscles. Contact your doctor or a physical therapist for more information on knee rehabilitation.    CONSTIPATION  Constipation is defined medically as fewer than three stools per week and severe constipation as less than one stool per week.  Even if you have a regular bowel pattern at home, your normal regimen is likely to be disrupted due to multiple reasons following surgery.  Combination of anesthesia, postoperative narcotics, change in appetite and fluid intake all can affect your bowels.   YOU MUST use at least one of the following options; they are listed in order of increasing strength to get the job done.  They are all available over the counter, and you may need to use some, POSSIBLY even all of these options:    Drink plenty of fluids (prune juice may be helpful) and high fiber foods Colace 100 mg by mouth twice a day  Senokot for constipation as directed and as needed Dulcolax (bisacodyl), take with full glass of water  Miralax (polyethylene glycol) once or twice a day as  needed.  If you have tried all these things and are unable to have a bowel movement in the first 3-4 days after surgery call either your surgeon or your primary doctor.    If you experience loose stools or diarrhea, hold the medications until you stool forms back up.  If your symptoms do not get better within 1 week or if they get worse, check with your doctor.  If you experience "the worst abdominal pain ever" or develop nausea or vomiting, please contact the office immediately for further recommendations for treatment.   ITCHING:  If you experience itching with your medications, try taking only a single pain pill, or even half a pain pill at a time.  You can also use Benadryl over the counter for itching or also to help with sleep.   TED HOSE STOCKINGS:  Use stockings on both   legs until for at least 2 weeks or as directed by physician office. They may be removed at night for sleeping.  MEDICATIONS:  See your medication summary on the "After Visit Summary" that nursing will review with you.  You may have some home medications which will be placed on hold until you complete the course of blood thinner medication.  It is important for you to complete the blood thinner medication as prescribed.   Blood clot prevention (DVT Prophylaxis): After surgery you are at an increased risk for a blood clot. you were prescribed a blood thinner to help reduce your risk of getting a blood clot. This will help prevent a blood clot. Signs of a pulmonary embolus (blood clot in the lungs) include sudden short of breath, feeling lightheaded or dizzy, chest pain with a deep breath, rapid pulse rapid breathing. Signs of a blood clot in your arms or legs include new unexplained swelling and cramping, warm, red or darkened skin around the painful area. Please call the office or 911 right away if these signs or symptoms develop.  PRECAUTIONS:  If you experience chest pain or shortness of breath - call 911 immediately for  transfer to the hospital emergency department.   If you develop a fever greater that 101 F, purulent drainage from wound, increased redness or drainage from wound, foul odor from the wound/dressing, or calf pain - CONTACT YOUR SURGEON.                                                   FOLLOW-UP APPOINTMENTS:  If you do not already have a post-op appointment, please call the office for an appointment to be seen by your surgeon.  Guidelines for how soon to be seen are listed in your "After Visit Summary", but are typically between 2-3 weeks after surgery.    POST-OPERATIVE OPIOID TAPER INSTRUCTIONS: It is important to wean off of your opioid medication as soon as possible. If you do not need pain medication after your surgery it is ok to stop day one. Opioids include: Codeine, Hydrocodone(Norco, Vicodin), Oxycodone(Percocet, oxycontin) and hydromorphone amongst others.  Long term and even short term use of opiods can cause: Increased pain response Dependence Constipation Depression Respiratory depression And more.  Withdrawal symptoms can include Flu like symptoms Nausea, vomiting And more Techniques to manage these symptoms Hydrate well Eat regular healthy meals Stay active Use relaxation techniques(deep breathing, meditating, yoga) Do Not substitute Alcohol to help with tapering If you have been on opioids for less than two weeks and do not have pain than it is ok to stop all together.  Plan to wean off of opioids This plan should start within one week post op of your joint replacement. Maintain the same interval or time between taking each dose and first decrease the dose.  Cut the total daily intake of opioids by one tablet each day Next start to increase the time between doses. The last dose that should be eliminated is the evening dose.   MAKE SURE YOU:  Understand these instructions.  Get help right away if you are not doing well or get worse.    Thank you for letting  us be a part of your medical care team.  It is a privilege we respect greatly.  We hope these instructions will help you stay on track  for a fast and full recovery!

## 2021-07-05 NOTE — Progress Notes (Signed)
Pt transferred to Short Stay for today's procedure. VSS and no complications during transport.

## 2021-07-06 ENCOUNTER — Other Ambulatory Visit (HOSPITAL_COMMUNITY): Payer: Self-pay

## 2021-07-06 DIAGNOSIS — I482 Chronic atrial fibrillation, unspecified: Secondary | ICD-10-CM | POA: Diagnosis not present

## 2021-07-06 LAB — BPAM RBC
Blood Product Expiration Date: 202306152359
ISSUE DATE / TIME: 202306120807
Unit Type and Rh: 600

## 2021-07-06 LAB — CBC
HCT: 28.8 % — ABNORMAL LOW (ref 39.0–52.0)
Hemoglobin: 9.7 g/dL — ABNORMAL LOW (ref 13.0–17.0)
MCH: 32.9 pg (ref 26.0–34.0)
MCHC: 33.7 g/dL (ref 30.0–36.0)
MCV: 97.6 fL (ref 80.0–100.0)
Platelets: 132 10*3/uL — ABNORMAL LOW (ref 150–400)
RBC: 2.95 MIL/uL — ABNORMAL LOW (ref 4.22–5.81)
RDW: 17.9 % — ABNORMAL HIGH (ref 11.5–15.5)
WBC: 6.8 10*3/uL (ref 4.0–10.5)
nRBC: 3 % — ABNORMAL HIGH (ref 0.0–0.2)

## 2021-07-06 LAB — TYPE AND SCREEN
ABO/RH(D): A NEG
Antibody Screen: NEGATIVE
Unit division: 0

## 2021-07-06 LAB — RENAL FUNCTION PANEL
Albumin: 2.9 g/dL — ABNORMAL LOW (ref 3.5–5.0)
Anion gap: 20 — ABNORMAL HIGH (ref 5–15)
BUN: 110 mg/dL — ABNORMAL HIGH (ref 8–23)
CO2: 19 mmol/L — ABNORMAL LOW (ref 22–32)
Calcium: 7.9 mg/dL — ABNORMAL LOW (ref 8.9–10.3)
Chloride: 96 mmol/L — ABNORMAL LOW (ref 98–111)
Creatinine, Ser: 6.82 mg/dL — ABNORMAL HIGH (ref 0.61–1.24)
GFR, Estimated: 8 mL/min — ABNORMAL LOW (ref >60)
Glucose, Bld: 158 mg/dL — ABNORMAL HIGH (ref 70–99)
Phosphorus: 10.4 mg/dL — ABNORMAL HIGH (ref 2.5–4.6)
Potassium: 5.9 mmol/L — ABNORMAL HIGH (ref 3.5–5.1)
Sodium: 135 mmol/L (ref 135–145)

## 2021-07-06 LAB — HEPATITIS B SURFACE ANTIBODY, QUANTITATIVE: Hep B S AB Quant (Post): <3.1 m[IU]/mL — ABNORMAL LOW (ref >9.9)

## 2021-07-06 MED ORDER — ROSUVASTATIN CALCIUM 20 MG PO TABS: 20.0000 mg | ORAL_TABLET | Freq: Every day | ORAL | Status: DC

## 2021-07-06 MED ORDER — ANTICOAGULANT SODIUM CITRATE 4% (200MG/5ML) IV SOLN
5.0000 mL | Status: DC | PRN
  Filled 2021-07-06: qty 5

## 2021-07-06 MED ORDER — MIDODRINE HCL 5 MG PO TABS
10.0000 mg | ORAL_TABLET | ORAL | Status: DC
  Administered 2021-07-06 – 2021-07-08 (×3): 10 mg via ORAL
  Filled 2021-07-06 (×3): qty 2

## 2021-07-06 MED ORDER — LIDOCAINE HCL (PF) 1 % IJ SOLN: 5.0000 mL | INTRAMUSCULAR | Status: DC | PRN

## 2021-07-06 MED ORDER — ALTEPLASE 2 MG IJ SOLR: 2.0000 mg | Freq: Once | INTRAMUSCULAR | Status: DC | PRN

## 2021-07-06 MED ORDER — ATORVASTATIN CALCIUM 80 MG PO TABS
80.0000 mg | ORAL_TABLET | Freq: Every day | ORAL | Status: DC
  Administered 2021-07-06 – 2021-07-09 (×4): 80 mg via ORAL
  Filled 2021-07-06 (×5): qty 1

## 2021-07-06 MED ORDER — LIDOCAINE-PRILOCAINE 2.5-2.5 % EX CREA: 1.0000 "application " | TOPICAL_CREAM | CUTANEOUS | Status: DC | PRN

## 2021-07-06 MED ORDER — HEPARIN SODIUM (PORCINE) 1000 UNIT/ML DIALYSIS
1000.0000 [IU] | INTRAMUSCULAR | Status: DC | PRN
  Administered 2021-07-06: 1000 [IU]
  Filled 2021-07-06: qty 1

## 2021-07-06 MED ORDER — SEVELAMER CARBONATE 800 MG PO TABS
1600.0000 mg | ORAL_TABLET | Freq: Three times a day (TID) | ORAL | Status: DC
  Administered 2021-07-06 – 2021-07-09 (×8): 1600 mg via ORAL
  Filled 2021-07-06 (×10): qty 2

## 2021-07-06 MED ORDER — PENTAFLUOROPROP-TETRAFLUOROETH EX AERO: 1.0000 "application " | INHALATION_SPRAY | CUTANEOUS | Status: DC | PRN

## 2021-07-06 MED ORDER — METOPROLOL TARTRATE 25 MG PO TABS
37.5000 mg | ORAL_TABLET | Freq: Two times a day (BID) | ORAL | Status: DC
  Administered 2021-07-06 – 2021-07-09 (×6): 37.5 mg via ORAL
  Filled 2021-07-06 (×8): qty 1

## 2021-07-06 NOTE — Progress Notes (Signed)
[facility] Progress Note   71 y.o. male CAD, h/o GIB, chronic combined diastolic and systolic CHF, HTN, DM type 2, HLD, atrial fibrillation with RVR (not on coagulation due to recent GIB on 05/11/21), and HD-dependent AKI due to ANCA vasculitis and recent start on HD in April 2023 who presented to [facility] ED on 07/03/21 after falling at home.  Xray noted right displaced femoral neck fracture and was admitted for surgical intervention.    His wife reports that he has been having a tough time with HD due to hypotension and inability to sit in recliner for 4 hours due to discomfort and agitation.  He requires midodrine prior to HD.  Dialysis Orders: Center: [facility]  on TTS  on [address] 3hr 45, EDW 93kg  Nipro  400/500 HCO3 39 T37 2/2.5 bath RIJ TC   Assessment/ Plan:    Displaced right femoral neck fracture - Ortho right hip arthroplasty on 07/05/21.  ESRD -  Will plan to continue with TTS schedule.   Seen on HD today 2K bath 120/91 RIJ TC [id number] asymp 2.5L UF as tolerated   Hypertension/volume  - has 1-2 + edema but RVR and low BP.  Will UF as tolerated and continue with midodrine before HD.  Anemia  - Will check iron stores (not sent yet);  ESA as needed.  Metabolic bone disease -  continue with home meds, phos 10.4. Not on a binder as outpt yet -> will start on renvela 2 tabs TIDM  Nutrition - renal diet carb, modified  ANCA vasculitis - s/p rituximab in April.  Per his wife, the biopsy revealed "a lot of scarring" and don't believe he will regain kidney function.  Continue with prednisone taper and PCP prophylaxis with Bactrim.  Atrial fibrillation with RVR - not on anticoagulation due to recent GI bleed.  Cardiology consulted for rate control prior to surgery.  Chronic combined systolic and diastolic CHF - has edema but UF limited by hypotension and A fib RVR.  Will UF as tolerated with HD and continue to use midodrine for BP support.  Hyperkalemia - dosed  Lokelma and follow; HD today.   Subjective:   Hungry, but otherwise doing well. Denies dyspnea, fever, chills, nausea   Objective:   BP (!) 120/91 (BP Location: Left Arm)   Pulse (!) 121 Comment: Simultaneous filing. User may not have seen previous data.  Temp 98.6 F (37 C) (Oral)   Resp 12   Ht 5\' 11"  (1.803 m)   Wt 98.8 kg   SpO2 100% Comment: Simultaneous filing. User may not have seen previous data.  BMI 30.38 kg/m   Intake/Output Summary (Last 24 hours) at 07/06/2021 0901 Last data filed at 07/05/2021 1300 Gross per 24 hour  Intake 1305 ml  Output 200 ml  Net 1105 ml   Weight change:   Physical Exam: General appearance: NAD Head: NCAT Eyes: negative scleral icterus Resp: clear to auscultation bilaterally Cardio: irregularly irregular rhythm  GI: SNDNT + BS Extremities: edema 1-2+ pretibial edema bilaterally  Access: RIJ TC  Imaging: DG HIP UNILAT W OR W/O PELVIS 2-3 VIEWS RIGHT  Result Date: 07/05/2021 CLINICAL DATA:  Postoperative. EXAM: DG HIP (WITH OR WITHOUT PELVIS) 2-3V RIGHT COMPARISON:  Pelvis and right hip radiographs 07/03/2021 FINDINGS: Interval total right hip arthroplasty. No perihardware lucency is seen to indicate hardware failure or loosening. Expected postoperative lateral hip soft tissue swelling and mild subcutaneous air. Moderate left femoroacetabular joint space narrowing. Mild-to-moderate superior pubic  symphysis joint space narrowing. IMPRESSION: Status post total right hip arthroplasty without evidence of hardware failure. Electronically Signed   By: Yvonne Kendall M.D.   On: 07/05/2021 11:55    Labs: BMET Recent Labs  Lab 07/03/21 1620 07/04/21 0553 07/04/21 2219 07/05/21 0317 07/06/21 0120  NA 138 138 135 136 135  K 4.7 5.1 5.2* 5.0 5.9*  CL 97* 98 96* 99 96*  CO2 24 25 23 22  19*  GLUCOSE 137* 117* 157* 109* 158*  BUN 74* 80* 92* 94* 110*  CREATININE 4.67* 5.36* 6.05* 6.16* 6.82*  CALCIUM 8.1* 8.2* 8.3* 8.0* 7.9*  PHOS  --  7.1*   --  9.0* 10.4*   CBC Recent Labs  Lab 07/03/21 1620 07/04/21 0553 07/05/21 0317 07/06/21 0120  WBC 7.3 7.4 5.5 6.8  NEUTROABS 5.3  --   --   --   HGB 8.8* 8.9* 8.1* 9.7*  HCT 27.3* 27.1* 24.8* 28.8*  MCV 102.6* 102.3* 101.2* 97.6  PLT 145* 133* 134* 132*    Medications:     acetaminophen  650 mg Oral Q6H   Or   acetaminophen  650 mg Rectal Q6H   calcium-vitamin D  1 tablet Oral Q breakfast   cefadroxil  500 mg Oral Daily   Chlorhexidine Gluconate Cloth  6 each Topical Daily   Chlorhexidine Gluconate Cloth  6 each Topical Q0600   metoprolol tartrate  37.5 mg Oral BID   midodrine  10 mg Oral Q T,Th,Sa-HD   omega-3 acid ethyl esters  1 g Oral BID   pantoprazole  40 mg Oral BID   [START ON 07/08/2021] predniSONE  20 mg Oral Q breakfast   predniSONE  30 mg Oral Q breakfast   QUEtiapine  100 mg Oral QHS   rosuvastatin  20 mg Oral Daily   sodium zirconium cyclosilicate  10 g Oral Daily   sulfamethoxazole-trimethoprim  1 tablet Oral Once per day on Mon Wed Fri   vitamin C  125 mg Oral Daily      Otelia Santee, MD 07/06/2021, 9:01 AM

## 2021-07-06 NOTE — Progress Notes (Addendum)
Daily Progress Note Intern Pager: [phone]  Patient name: Blake West Medical record number: [id number] Date of birth: [birth year] Age: 71 y.o. Gender: male  Primary Care Provider: Leonel Ramsay, MD Consultants: Cardiology, nephrology, orthopedics Code Status: Partial code, DNI  Pt Overview and Major Events to Date:  6/10: admitted 6/12: right hip hemiarthroplasty  Assessment and Plan: Blake West is a 71 y.o. male who presented with right femoral neck fracture now status post right hip hemiarthroplasty. Pertinent PMH/PSH includes ESRD on HD, ANCA associated vasculitis, CAD, HFmrEF, NICM, chronic atrial fibrillation with RVR, HTN, thrombocytopenia, diet controlled T2DM, OSA, hypothyroidism.  * Displaced fracture of right femoral neck (HCC) Now s/p R hip hemiarthroplasty. Pain adequately controlled. Has minimally required PRN pain medications. -Ortho following, appreciate recommendations -PT/OT eval and treat Cefadroxil 500mg  BID x7 days, per orthopedics, today is day 1/7 -Pain control with Tylenol 650 mg q6h, Oxy 2.5 mg q4h PRN, Fentanyl 50 mcg q2h for breakthrough pain -Delirium precautions (hx of this on prior admissions) -Continue Os-cal, Vitamin D supplementation  -Consider starting bisphosphate prior to d/c   ESRD (end stage renal disease) on dialysis (HCC) Stable.  HD on TTS. -Nephrology following, HD today -Daily renal function panel -Avoid nephrotoxic agents -Midodrine 3 times weekly with HD   ANCA-associated vasculitis (HCC) Currently on Prednisone daily and Bactrim 3 times weekly for PCP ppx.  -continue prednisone taper 30 mg daily, then 20 mg daily on 6/15 -Continue Bactrim MWF  Chronic atrial fibrillation with RVR (HCC) Generally remains in A-fib with RVR, asymptomatic.  Patient has declined aggressive rate control in the past.  Not on anticoagulation due to history of GI bleed. -Cardiology following, appreciate recommendations -tele  monitoring -Increase metoprolol tartrate to 37.5 mg twice daily -hold anticoagulation   Heart failure with mid-range ejection fraction (HCC) Recent TTE showing EF 40 to 45%.  Appears mildly volume overloaded on exam with 2+ pitting edema.  Will defer need for diuresis to cardiology.  Hypertension associated with diabetes ([place]) Adequately controlled ranging 90s-110s/70s-80s. - continue metoprolol tartrate 25mg  every 6 hours  Macrocytic anemia Hgb improved 8.1>9.7. -CBC daily, transfusion for hemoglobin less than 8 -Monitor for signs/symptoms of bleeding. -Continue Protonix 40 mg BID for chronic ulcers -Continue B vitamin supplementation  Coronary artery disease involving native coronary artery of native heart without angina pectoris Was not previously receiving statin. -Start atorvastatin 80mg  daily  Type 2 diabetes mellitus ([place]) Diet controlled, monitor on labs.  OSA (obstructive sleep apnea) States that he is unable to tolerate CPAP. On 4L [place] currently with normal saturations. -Wean oxygen  -Monitor saturations overnight, may need assistance with [place] to keep sats >92%  FEN/GI: Carb modified PPx: SCDs, anticoagulation contraindicated given anemia 2/2 chronic ulcers Dispo:Pending PT recommendations .  Subjective:  Pt states his pain is improved from yesterday. Does not have any questions or concerns currently.   Objective: Temp:  [97.6 F (36.4 C)-98.6 F (37 C)] 98.2 F (36.8 C) (06/13 1114) Pulse Rate:  [33-139] 119 (06/13 1114) Resp:  [12-21] 16 (06/13 1114) BP: (87-120)/(71-91) 114/82 (06/13 1114) SpO2:  [90 %-100 %] 98 % (06/13 1114) Weight:  [97.6 kg-98.8 kg] 97.6 kg (06/13 1030) Physical Exam: General: awake, alert, watching TV while getting HD Cardiovascular: irregularly irregular, tachycardic, no murmurs auscultated Respiratory: CTAB, [place] in place on 4L Extremities: 2+ pitting edema bilaterally, SCDs in place  Laboratory: Most recent CBC Lab Results   Component Value Date   WBC 6.8 07/06/2021  HGB 9.7 (L) 07/06/2021   HCT 28.8 (L) 07/06/2021   MCV 97.6 07/06/2021   PLT 132 (L) 07/06/2021   Most recent BMP    Latest Ref Rng & Units 07/06/2021    1:20 AM  BMP  Glucose 70 - 99 mg/dL 158   BUN 8 - 23 mg/dL 110   Creatinine 0.61 - 1.24 mg/dL 6.82   Sodium 135 - 145 mmol/L 135   Potassium 3.5 - 5.1 mmol/L 5.9   Chloride 98 - 111 mmol/L 96   CO2 22 - 32 mmol/L 19   Calcium 8.9 - 10.3 mg/dL 7.9    Imaging/Diagnostic Tests: No recent imaging. Wells Guiles, DO 07/06/2021, 2:47 PM  PGY-1, [place] Intern pager: [phone], text pages welcome Secure chat group [place]

## 2021-07-06 NOTE — Anesthesia Postprocedure Evaluation (Signed)
Anesthesia Post Note  Patient: Blake West  Procedure(s) Performed: ARTHROPLASTY BIPOLAR HIP (HEMIARTHROPLASTY) (Right: Hip)     Patient location during evaluation: PACU Anesthesia Type: General Level of consciousness: patient cooperative Pain management: pain level controlled Vital Signs Assessment: post-procedure vital signs reviewed and stable Respiratory status: spontaneous breathing, nonlabored ventilation, respiratory function stable and patient connected to nasal cannula oxygen Cardiovascular status: blood pressure returned to baseline and stable Postop Assessment: no apparent nausea or vomiting Anesthetic complications: no   No notable events documented.  Last Vitals:  Vitals:   07/06/21 0859 07/06/21 0900  BP: (!) 120/91 (!) 120/91  Pulse:  (!) 107  Resp:  14  Temp:    SpO2:  100%    Last Pain:  Vitals:   07/06/21 0740  TempSrc:   PainSc: 0-No pain                 Kiffany Schelling

## 2021-07-06 NOTE — Progress Notes (Signed)
FPTS Brief Progress Note  S:Went to see patient, sleeping comfortably. Did not disturb.   O: BP 107/87 (BP Location: Left Arm)   Pulse (!) 120   Temp 97.6 F (36.4 C) (Oral)   Resp 19   Ht 5\' 11"  (1.803 m)   Wt 95.4 kg   SpO2 98%   BMI 29.33 kg/m     A/P: - Plans per day team - Orders reviewed. Labs for AM ordered, which was adjusted as needed.   Holley Bouche, MD 07/06/2021, 3:52 AM PGY-1, [facility] Night Resident  Please page [phone] with questions.

## 2021-07-06 NOTE — Evaluation (Signed)
Physical Therapy Evaluation Patient Details Name: Blake West MRN: [id number] DOB: February 28, 1950 Today's Date: 07/06/2021  History of Present Illness  Pt is a 71 y.o. F who presents 07/03/2021 after a fall with a right femoral neck fracture now s/p hemiarthroplasty. Significant PMH: ESRD on HD, ANCA associated vasculitis, CAD, HFrEF, NICM, chronic atrial fibrillation with RVR, HTN, thrombocytopenia, DM2.  Clinical Impression  Pt admitted s/p procedure listed above. Despite premedication, PT evaluation significantly limited by right hip pain. Pt able to participate in bed level therapeutic exercises for BLE ROM and strengthening. Attempted bed mobility (I.e. rolling, progression from supine to sit), however, pt requesting PT to stop due to increase in pain levels. SpO2 97-98% on 4L O2, HR 112-130 afib. Education provided regarding mobility progression as tolerated. Will continue efforts.     Recommendations for follow up therapy are one component of a multi-disciplinary discharge planning process, led by the attending physician.  Recommendations may be updated based on patient status, additional functional criteria and insurance authorization.  Follow Up Recommendations Skilled nursing-short term rehab (<3 hours/day)    Assistance Recommended at Discharge Frequent or constant Supervision/Assistance  Patient can return home with the following  Two people to help with walking and/or transfers;Two people to help with bathing/dressing/bathroom    Equipment Recommendations BSC/3in1;Hospital bed;Other (comment) (hoyer lift)  Recommendations for Other Services       Functional Status Assessment Patient has had a recent decline in their functional status and demonstrates the ability to make significant improvements in function in a reasonable and predictable amount of time.     Precautions / Restrictions Precautions Precautions: Fall;Posterior Hip Precaution Booklet Issued: No Restrictions Weight  Bearing Restrictions: Yes RLE Weight Bearing: Weight bearing as tolerated      Mobility  Bed Mobility Overal bed mobility: Needs Assistance             General bed mobility comments: Pt requiring assist to progress RLE to right side of bed, pt then stating, "stop; I cannot go further." returned pt to midline. Attempted to perform partial roll towards left to reposition and adjust bed pad and pt unable to tolerate rolling in either direction ultimately.    Transfers                   General transfer comment: unable    Ambulation/Gait                  Stairs            Wheelchair Mobility    Modified Rankin (Stroke Patients Only)       Balance                                             Pertinent Vitals/Pain Pain Assessment Pain Assessment: Faces Faces Pain Scale: Hurts worst Pain Location: R hip Pain Descriptors / Indicators: Operative site guarding, Sharp, Grimacing Pain Intervention(s): Limited activity within patient's tolerance, Monitored during session, Premedicated before session    Home Living Family/patient expects to be discharged to:: Private residence Living Arrangements: Spouse/significant other Available Help at Discharge: Family;Available 24 hours/day Type of Home: House Home Access: Stairs to enter Entrance Stairs-Rails: Right;Left;Can reach both Entrance Stairs-Number of Steps: 3 Alternate Level Stairs-Number of Steps: Flight Home Layout: Two level;Able to live on main level with bedroom/bathroom Home Equipment: Rolling Walker (2 wheels);Wheelchair - Brewing technologist (  lift chair)      Prior Function Prior Level of Function : Needs assist             Mobility Comments: using RW ADLs Comments: spouse helps with bathing     Hand Dominance        Extremity/Trunk Assessment   Upper Extremity Assessment Upper Extremity Assessment: Defer to OT evaluation    Lower Extremity  Assessment Lower Extremity Assessment: RLE deficits/detail;LLE deficits/detail RLE Deficits / Details: Increased edema, able to perform limited heel slide and quad set, s/p hemiarthroplasty LLE Deficits / Details: Increased edema, grossly 2+/5-3/5       Communication   Communication: No difficulties  Cognition Arousal/Alertness: Awake/alert Behavior During Therapy: WFL for tasks assessed/performed Overall Cognitive Status: Impaired/Different from baseline Area of Impairment: Following commands, Safety/judgement, Awareness, Problem solving                       Following Commands: Follows one step commands with increased time Safety/Judgement: Decreased awareness of deficits Awareness: Emergent Problem Solving: Difficulty sequencing, Requires verbal cues General Comments: Pt reluctant to participate, pt wife encouraging. Pt with increased time to respond to questions and initiate tasks. Difficulty sequencing and distracted by pain        General Comments      Exercises General Exercises - Lower Extremity Ankle Circles/Pumps: Both, 10 reps, Supine Quad Sets: Both, 10 reps, Supine Heel Slides: Both, AROM, AAROM, 10 reps, Supine, Other (comment) (RLE to ~20 deg)   Assessment/Plan    PT Assessment Patient needs continued PT services  PT Problem List Decreased strength;Decreased activity tolerance;Decreased balance;Decreased mobility;Decreased cognition;Decreased safety awareness;Decreased knowledge of precautions;Pain       PT Treatment Interventions Gait training;DME instruction;Functional mobility training;Therapeutic activities;Therapeutic exercise;Balance training;Patient/family education    PT Goals (Current goals can be found in the Care Plan section)  Acute Rehab PT Goals Patient Stated Goal: less pain PT Goal Formulation: With patient/family Time For Goal Achievement: 07/20/21 Potential to Achieve Goals: Fair    Frequency Min 3X/week     Co-evaluation                AM-PAC PT "6 Clicks" Mobility  Outcome Measure Help needed turning from your back to your side while in a flat bed without using bedrails?: Total Help needed moving from lying on your back to sitting on the side of a flat bed without using bedrails?: Total Help needed moving to and from a bed to a chair (including a wheelchair)?: Total Help needed standing up from a chair using your arms (e.g., wheelchair or bedside chair)?: Total Help needed to walk in hospital room?: Total Help needed climbing 3-5 steps with a railing? : Total 6 Click Score: 6    End of Session Equipment Utilized During Treatment: Oxygen Activity Tolerance: Patient limited by pain Patient left: in bed;with call bell/phone within reach;with bed alarm set;with family/visitor present Nurse Communication: Mobility status PT Visit Diagnosis: Other abnormalities of gait and mobility (R26.89);Pain Pain - Right/Left: Right Pain - part of body: Hip    Time: 7989-2119 PT Time Calculation (min) (ACUTE ONLY): 26 min   Charges:   PT Evaluation $PT Eval Moderate Complexity: 1 Mod PT Treatments $Therapeutic Activity: 8-22 mins        Wyona Almas, PT, DPT Acute Rehabilitation Services Office [phone]   Deno Etienne 07/06/2021, 4:02 PM

## 2021-07-06 NOTE — Plan of Care (Signed)
  Problem: Activity: Goal: Risk for activity intolerance will decrease Outcome: Progressing   Problem: Nutrition: Goal: Adequate nutrition will be maintained Outcome: Progressing   

## 2021-07-06 NOTE — Progress Notes (Signed)
PT Cancellation Note  Patient Details Name: Blake West MRN: [id number] DOB: Jul 04, 1950   Cancelled Treatment:    Reason Eval/Treat Not Completed: Patient at procedure or test/unavailable (HD)  Wyona Almas, PT, DPT Acute Rehabilitation Services Office [place] 07/06/2021, 7:29 AM

## 2021-07-06 NOTE — TOC Benefit Eligibility Note (Signed)
Patient Research scientist (life sciences) completed.     The patient is currently admitted and upon discharge could be taking Renvela.   The current 30 day co-pay is, $4.15 for #180 of the generic.   The patient is insured through [employer].

## 2021-07-06 NOTE — Progress Notes (Addendum)
Patient getting agitated for the second time,he wanted to end his HD treatment immediately  tried to encouraged patient to complete his treatment but patient has been refusing cranial nerves CN made aware.Patient signed out early on his HD treatment .Renal MD Aware .Report given to unit R.N.

## 2021-07-06 NOTE — Assessment & Plan Note (Signed)
Was not previously receiving statin. -Start atorvastatin 80mg  daily

## 2021-07-06 NOTE — Progress Notes (Signed)
Paged Renal on call to changed patient dialyzer to [facility] [phone].

## 2021-07-06 NOTE — TOC CAGE-AID Note (Addendum)
Transition of Care [facility]) - CAGE-AID Screening   Patient Details  Name: Blake West MRN: [id number] Date of Birth: 09-04-1950  Transition of Care [facility]) CM/SW Contact:    Junko Ohagan C Tarpley-Carter, [place] Phone Number: 07/06/2021, 12:50 PM   Clinical Narrative: Pt participated in [place].  Pt stated he does not use substance or ETOH.  Pt was not offered resources, due to no usage of substance or ETOH.    Sha Amer Tarpley-Carter, MSW, LCSW-A Pronouns:  She/Her/Hers [facility] Clinical Social Worker Direct Number:  [phone] [email or web address]  CAGE-AID Screening:    Have You Ever Felt You Ought to Cut Down on Your Drinking or Drug Use?: No Have People Annoyed You By [employer] Your Drinking Or Drug Use?: No Have You Felt Bad Or Guilty About Your Drinking Or Drug Use?: No Have You Ever Had a Drink or Used Drugs First Thing In The Morning to Steady Your Nerves or to Get Rid of a Hangover?: No CAGE-AID Score: 0  Substance Abuse Education Offered: No

## 2021-07-06 NOTE — Progress Notes (Signed)
Paged MD regarding patient's HR being in the 130's-140s. Asked if they wanted to give a dose of lopressor. MD stated that they were aware of the patient's HR and not give dose of lopressor. Also was told to call back if HR continues to be high. No new orders received.   Albertina Senegal E

## 2021-07-06 NOTE — Progress Notes (Signed)
Progress Note  Patient Name: Blake West Date of Encounter: 07/06/2021  [facility] Cardiologist: Nelva Bush, MD   Subjective   No CP or dyspnea  Inpatient Medications    Scheduled Meds:  acetaminophen  650 mg Oral Q6H   Or   acetaminophen  650 mg Rectal Q6H   calcium-vitamin D  1 tablet Oral Q breakfast   cefadroxil  500 mg Oral Daily   Chlorhexidine Gluconate Cloth  6 each Topical Daily   Chlorhexidine Gluconate Cloth  6 each Topical Q0600   metoprolol tartrate  25 mg Oral Q6H   midodrine  10 mg Oral Q T,Th,Sa-HD   omega-3 acid ethyl esters  1 g Oral BID   pantoprazole  40 mg Oral BID   [START ON 07/08/2021] predniSONE  20 mg Oral Q breakfast   predniSONE  30 mg Oral Q breakfast   QUEtiapine  100 mg Oral QHS   sodium zirconium cyclosilicate  10 g Oral Daily   sulfamethoxazole-trimethoprim  1 tablet Oral Once per day on Mon Wed Fri   vitamin C  125 mg Oral Daily   Continuous Infusions:  anticoagulant sodium citrate     PRN Meds: alteplase, anticoagulant sodium citrate, fentaNYL (SUBLIMAZE) injection, heparin, lidocaine (PF), lidocaine-prilocaine, oxyCODONE, pentafluoroprop-tetrafluoroeth, polyethylene glycol   Vital Signs    Vitals:   07/06/21 0730 07/06/21 0739 07/06/21 0740 07/06/21 0751  BP: 94/71 94/71  (!) 87/73  Pulse: (!) 117 (!) 139  (!) 105  Resp: (!) 21 15  13   Temp: 98.6 F (37 C)     TempSrc: Oral     SpO2: 98% 99%  100%  Weight:   98.8 kg   Height:        Intake/Output Summary (Last 24 hours) at 07/06/2021 0800 Last data filed at 07/05/2021 1300 Gross per 24 hour  Intake 1555 ml  Output 200 ml  Net 1355 ml      07/06/2021    7:40 AM 07/04/2021    3:30 AM 07/03/2021    4:14 PM  Last 3 Weights  Weight (lbs) 217 lb 13 oz 210 lb 5.1 oz 200 lb  Weight (kg) 98.8 kg 95.4 kg 90.719 kg      Telemetry    Atrial fibrillation rate mildly elevated - Personally Reviewed  Physical Exam   GEN: No acute distress. On dialysis  Neck: No  JVD Cardiac: irregular, tachycardic Respiratory: Clear to auscultation bilaterally. GI: Soft, nontender, non-distended  MS: No edema; s/p hip surgery Neuro:  Nonfocal  Psych: Normal affect   Labs   Chemistry Recent Labs  Lab 07/03/21 1620 07/04/21 0553 07/04/21 2219 07/05/21 0317 07/06/21 0120  NA 138 138 135 136 135  K 4.7 5.1 5.2* 5.0 5.9*  CL 97* 98 96* 99 96*  CO2 24 25 23 22  19*  GLUCOSE 137* 117* 157* 109* 158*  BUN 74* 80* 92* 94* 110*  CREATININE 4.67* 5.36* 6.05* 6.16* 6.82*  CALCIUM 8.1* 8.2* 8.3* 8.0* 7.9*  MG  --   --  2.1  --   --   PROT 5.7*  --   --   --   --   ALBUMIN 3.1* 3.0*  --  2.7* 2.9*  AST 24  --   --   --   --   ALT 31  --   --   --   --   ALKPHOS 39  --   --   --   --   BILITOT 0.7  --   --   --   --  GFRNONAA 13* 11* 9* 9* 8*  ANIONGAP 17* 15 16* 15 20*    Lipids  Recent Labs  Lab 07/04/21 0553  CHOL 200  TRIG 197*  HDL 37*  LDLCALC 124*  CHOLHDL 5.4    Hematology Recent Labs  Lab 07/04/21 0553 07/05/21 0317 07/06/21 0120  WBC 7.4 5.5 6.8  RBC 2.65* 2.45* 2.95*  HGB 8.9* 8.1* 9.7*  HCT 27.1* 24.8* 28.8*  MCV 102.3* 101.2* 97.6  MCH 33.6 33.1 32.9  MCHC 32.8 32.7 33.7  RDW 17.1* 16.6* 17.9*  PLT 133* 134* 132*   Thyroid  Recent Labs  Lab 07/04/21 0553  TSH 12.911*     Radiology    DG HIP UNILAT W OR W/O PELVIS 2-3 VIEWS RIGHT  Result Date: 07/05/2021 CLINICAL DATA:  Postoperative. EXAM: DG HIP (WITH OR WITHOUT PELVIS) 2-3V RIGHT COMPARISON:  Pelvis and right hip radiographs 07/03/2021 FINDINGS: Interval total right hip arthroplasty. No perihardware lucency is seen to indicate hardware failure or loosening. Expected postoperative lateral hip soft tissue swelling and mild subcutaneous air. Moderate left femoroacetabular joint space narrowing. Mild-to-moderate superior pubic symphysis joint space narrowing. IMPRESSION: Status post total right hip arthroplasty without evidence of hardware failure. Electronically Signed    By: Yvonne Kendall M.D.   On: 07/05/2021 11:55      Patient Profile     71 y.o. male with past medical history of permanent atrial fibrillation not on anticoagulation due to GI bleed, end-stage renal disease dialysis, nonischemic cardiomyopathy admitted with right femoral neck fracture.  Cardiology asked to evaluate preoperatively and for atrial fibrillation.  Echocardiogram April 2023 showed ejection fraction 40 to 45%, global hypokinesis, mild to moderate left atrial enlargement, mild mitral regurgitation, mildly dilated aortic root at 43 mm.  Assessment & Plan    1 Permanent atrial fibrillation-heart rate remains mildly elevated.  This may be driven by hyperadrenergic state associated with recent surgery/pain.  Continue metoprolol (will change to 37 5 mg twice daily).  Blood pressure is borderline.  May need to decrease metoprolol and instead try and control heart rate with amiodarone.  We will follow.  He is not anticoagulated due to recent GI bleed.  2 history of nonischemic cardiomyopathy-blood pressure will not allow guideline directed medical therapy other than beta-blocker as outlined.  3 history of chronic hypotension-continue midodrine.  4 CAD-cardiomyopathy of proportion to coronary artery disease.  Would add Crestor 20 mg daily.  Check lipids and liver in 8 weeks.  5 history of end-stage renal disease-dialysis per nephrology.  6 Status post right hemiarthroplasty-Per orthopedics  7 Elevated TSH-Per primary care  For questions or updates, please contact [place] Please consult [email or web address] for contact info under        Signed, Kirk Ruths, MD  07/06/2021, 8:00 AM

## 2021-07-07 ENCOUNTER — Encounter (HOSPITAL_COMMUNITY): Payer: Self-pay | Admitting: Orthopedic Surgery

## 2021-07-07 DIAGNOSIS — I482 Chronic atrial fibrillation, unspecified: Secondary | ICD-10-CM | POA: Diagnosis not present

## 2021-07-07 DIAGNOSIS — I4891 Unspecified atrial fibrillation: Secondary | ICD-10-CM | POA: Insufficient documentation

## 2021-07-07 LAB — RENAL FUNCTION PANEL
Albumin: 2.7 g/dL — ABNORMAL LOW (ref 3.5–5.0)
Anion gap: 16 — ABNORMAL HIGH (ref 5–15)
BUN: 76 mg/dL — ABNORMAL HIGH (ref 8–23)
CO2: 24 mmol/L (ref 22–32)
Calcium: 7.6 mg/dL — ABNORMAL LOW (ref 8.9–10.3)
Chloride: 95 mmol/L — ABNORMAL LOW (ref 98–111)
Creatinine, Ser: 5.39 mg/dL — ABNORMAL HIGH (ref 0.61–1.24)
GFR, Estimated: 11 mL/min — ABNORMAL LOW (ref >60)
Glucose, Bld: 122 mg/dL — ABNORMAL HIGH (ref 70–99)
Phosphorus: 7.1 mg/dL — ABNORMAL HIGH (ref 2.5–4.6)
Potassium: 4.4 mmol/L (ref 3.5–5.1)
Sodium: 135 mmol/L (ref 135–145)

## 2021-07-07 LAB — CBC
HCT: 27.1 % — ABNORMAL LOW (ref 39.0–52.0)
Hemoglobin: 8.9 g/dL — ABNORMAL LOW (ref 13.0–17.0)
MCH: 32.2 pg (ref 26.0–34.0)
MCHC: 32.8 g/dL (ref 30.0–36.0)
MCV: 98.2 fL (ref 80.0–100.0)
Platelets: 129 10*3/uL — ABNORMAL LOW (ref 150–400)
RBC: 2.76 MIL/uL — ABNORMAL LOW (ref 4.22–5.81)
RDW: 18.1 % — ABNORMAL HIGH (ref 11.5–15.5)
WBC: 5.8 10*3/uL (ref 4.0–10.5)
nRBC: 2.4 % — ABNORMAL HIGH (ref 0.0–0.2)

## 2021-07-07 LAB — PATHOLOGIST SMEAR REVIEW

## 2021-07-07 MED ORDER — AMIODARONE HCL 200 MG PO TABS
400.0000 mg | ORAL_TABLET | Freq: Two times a day (BID) | ORAL | Status: DC
  Administered 2021-07-07 – 2021-07-09 (×5): 400 mg via ORAL
  Filled 2021-07-07 (×6): qty 2

## 2021-07-07 MED ORDER — AMIODARONE HCL 200 MG PO TABS: 200.0000 mg | ORAL_TABLET | Freq: Every day | ORAL | Status: DC

## 2021-07-07 MED ORDER — AMIODARONE HCL 200 MG PO TABS: 400.0000 mg | ORAL_TABLET | Freq: Two times a day (BID) | ORAL | Status: DC

## 2021-07-07 NOTE — Progress Notes (Signed)
07/07/21 1400  Clinical Encounter Type  Visited With Patient and family together;Other (Comment) (Wife: Blake West)  Visit Type Initial;Other (Comment) (Advance Directive Education)  Referral From Nurse  Consult/Referral To Chaplain Melvenia Beam)   Chaplain responded to spiritual care consult request for Advance Directive education with Mr. Blake Franta. [place]. Met with Blake West and his wife Blake West at patient's bedside. Mr. Hoogland stated that he intends to list his step daughter as his health care agent.    Chaplain provided the Advance Directive packet as well as education on Advance Directives-documents an individual completes to communicate their health care directions in advance of a time when they may need them. Chaplain informed Blake West the documents which may be completed here in the hospital are the Living Will and [place].   Chaplain informed patient that the [place] is a legal document in which an individual names another person, as their [place], to communicate his health care decisions, when he, himself is not able to make them for himself. The Health Care Agent's function can be temporary or permanent depending on his ability to make and communicate those decisions independently.  Chaplain informed Blake West in the absence of a [place], the state of [place] directs health care providers to look to the following individuals in the order listed: legal guardian; an attorney-in-fact under a general power of attorney (POA) if that POA includes the right to make health care decisions; person's spouse; a 62 of his children; a 87 of adult brothers and sisters; or an individual who has an established relationship with you, who is acting in good faith and who can convey your wishes.  If none of these persons are available or willing to make medical decisions on a patient's behalf, the  law allows the patient's doctor to make decisions for them as long as another doctor agrees with those decisions.  Chaplain also informed the patient that the Health Care agent has no decision-making authority over any affairs other than those related to his medical care.   The chaplain further educated Blake West that a Living Will is a legal document that allows his desires not to receive life-prolonging measures in the event that they have a condition that is incurable and will result in his death in a short period of time; they are unconscious, and doctors are confident that they will not regain consciousness; and/or they have advanced dementia or other substantial and irreversible loss of mental function.   The chaplain informed Blake West that life-prolonging measures are medical treatments that would only serve to postpone death, including breathing machines, kidney dialysis, antibiotics, artificial nutrition and hydration (tube feeding), and similar forms of treatment and that if an individual is able to express their wishes, they may also make them known without the use of a Living Will, but in the event that he is not able to express his wishes, a Living Will allows medical providers and the his family and friends to ensure that they are not making decisions on his behalf, but rather serving as his voice to convey the decisions that he has already made.   Blake West is aware that the decision to create an advance directive is his alone and he may choose not to complete the documents or may choose to complete one portion or both.  Blake West was informed that he can revoke the documents at any time  by striking through them and writing void or by completing new documents, but that it is also advisable that the individual verbally notify interested parties that his wishes have changed.   Blake West is also aware that the document must be signed in the presence of a notary public and two witnesses and  that this can be done while the patient is still admitted to the hospital or after discharge in the community. If they decide to complete Advance Directives after being discharged from the hospital, they have been advised to notify all interested parties and to provide those documents to their physicians and loved ones in addition to bringing them to the hospital in the event of another hospitalization.   The chaplain informed the Blake West that if he desires to proceed with completing Advance Directive Documentation while he is still admitted, notary services are typically available at [facility] between the hours of 1:00 and 3:30 Monday-Thursday. Blake West did not have any additional questions and stated that his daughter would be visiting later today and that they would complete the documents at that time.   When the patient is ready to have these documents completed, the patient should request that their nurse place a spiritual care consult and indicate that the patient is ready to have their advance directives notarized so that arrangements for witnesses and notary public can be made.   Please page spiritual care if the patient desires further education or has questions.   [address] [place], M. Min., [phone].

## 2021-07-07 NOTE — Progress Notes (Signed)
Progress Note  Patient Name: Blake West Date of Encounter: 07/07/2021  [facility] Cardiologist: Nelva Bush, MD   Subjective   No CP or dyspnea; complains of leg/hip pain  Inpatient Medications    Scheduled Meds:  acetaminophen  650 mg Oral Q6H   Or   acetaminophen  650 mg Rectal Q6H   atorvastatin  80 mg Oral Daily   calcium-vitamin D  1 tablet Oral Q breakfast   cefadroxil  500 mg Oral Daily   Chlorhexidine Gluconate Cloth  6 each Topical Daily   Chlorhexidine Gluconate Cloth  6 each Topical Q0600   metoprolol tartrate  37.5 mg Oral BID   midodrine  10 mg Oral Q T,Th,Sa-HD   omega-3 acid ethyl esters  1 g Oral BID   pantoprazole  40 mg Oral BID   [START ON 07/08/2021] predniSONE  20 mg Oral Q breakfast   predniSONE  30 mg Oral Q breakfast   QUEtiapine  100 mg Oral QHS   sevelamer carbonate  1,600 mg Oral TID WC   sodium zirconium cyclosilicate  10 g Oral Daily   sulfamethoxazole-trimethoprim  1 tablet Oral Once per day on Mon Wed Fri   vitamin C  125 mg Oral Daily   Continuous Infusions:   PRN Meds: oxyCODONE, polyethylene glycol   Vital Signs    Vitals:   07/06/21 1927 07/07/21 0400 07/07/21 0719 07/07/21 0849  BP: 101/76 93/74 103/75   Pulse:   (!) 118 (!) 116  Resp: 18 16  (!) 22  Temp: 97.7 F (36.5 C) 97.7 F (36.5 C) 97.6 F (36.4 C)   TempSrc: Oral Oral Oral   SpO2: 97% 92% 99% 99%  Weight:      Height:        Intake/Output Summary (Last 24 hours) at 07/07/2021 0856 Last data filed at 07/07/2021 0531 Gross per 24 hour  Intake --  Output 502.4 ml  Net -502.4 ml       07/06/2021   10:30 AM 07/06/2021    7:40 AM 07/04/2021    3:30 AM  Last 3 Weights  Weight (lbs) 215 lb 2.7 oz 217 lb 13 oz 210 lb 5.1 oz  Weight (kg) 97.6 kg 98.8 kg 95.4 kg      Telemetry    Atrial fibrillation rate elevated - Personally Reviewed  Physical Exam   GEN: No acute distress.  Neck: supple Cardiac: irregular, tachycardic, no  gallop Respiratory: CTA GI: Soft, NT/ND MS: No edema; s/p hip surgery Neuro:  Grossly intact Psych: Normal affect   Labs   Chemistry Recent Labs  Lab 07/03/21 1620 07/04/21 0553 07/04/21 2219 07/05/21 0317 07/06/21 0120 07/07/21 0137  NA 138   < > 135 136 135 135  K 4.7   < > 5.2* 5.0 5.9* 4.4  CL 97*   < > 96* 99 96* 95*  CO2 24   < > 23 22 19* 24  GLUCOSE 137*   < > 157* 109* 158* 122*  BUN 74*   < > 92* 94* 110* 76*  CREATININE 4.67*   < > 6.05* 6.16* 6.82* 5.39*  CALCIUM 8.1*   < > 8.3* 8.0* 7.9* 7.6*  MG  --   --  2.1  --   --   --   PROT 5.7*  --   --   --   --   --   ALBUMIN 3.1*   < >  --  2.7* 2.9* 2.7*  AST 24  --   --   --   --   --  ALT 31  --   --   --   --   --   ALKPHOS 39  --   --   --   --   --   BILITOT 0.7  --   --   --   --   --   GFRNONAA 13*   < > 9* 9* 8* 11*  ANIONGAP 17*   < > 16* 15 20* 16*   < > = values in this interval not displayed.     Lipids  Recent Labs  Lab 07/04/21 0553  CHOL 200  TRIG 197*  HDL 37*  LDLCALC 124*  CHOLHDL 5.4     Hematology Recent Labs  Lab 07/05/21 0317 07/06/21 0120 07/07/21 0137  WBC 5.5 6.8 5.8  RBC 2.45* 2.95* 2.76*  HGB 8.1* 9.7* 8.9*  HCT 24.8* 28.8* 27.1*  MCV 101.2* 97.6 98.2  MCH 33.1 32.9 32.2  MCHC 32.7 33.7 32.8  RDW 16.6* 17.9* 18.1*  PLT 134* 132* 129*    Thyroid  Recent Labs  Lab 07/04/21 0553  TSH 12.911*      Radiology    DG HIP UNILAT W OR W/O PELVIS 2-3 VIEWS RIGHT  Result Date: 07/05/2021 CLINICAL DATA:  Postoperative. EXAM: DG HIP (WITH OR WITHOUT PELVIS) 2-3V RIGHT COMPARISON:  Pelvis and right hip radiographs 07/03/2021 FINDINGS: Interval total right hip arthroplasty. No perihardware lucency is seen to indicate hardware failure or loosening. Expected postoperative lateral hip soft tissue swelling and mild subcutaneous air. Moderate left femoroacetabular joint space narrowing. Mild-to-moderate superior pubic symphysis joint space narrowing. IMPRESSION: Status post  total right hip arthroplasty without evidence of hardware failure. Electronically Signed   By: Yvonne Kendall M.D.   On: 07/05/2021 11:55      Patient Profile     71 y.o. male with past medical history of permanent atrial fibrillation not on anticoagulation due to GI bleed, end-stage renal disease dialysis, nonischemic cardiomyopathy admitted with right femoral neck fracture.  Cardiology asked to evaluate preoperatively and for atrial fibrillation.  Echocardiogram April 2023 showed ejection fraction 40 to 45%, global hypokinesis, mild to moderate left atrial enlargement, mild mitral regurgitation, mildly dilated aortic root at 43 mm.  Assessment & Plan    1 Permanent atrial fibrillation-heart rate remains mildly elevated.  This may be driven by hyperadrenergic state associated with recent surgery/pain.  Continue metoprolol.  Blood pressure is borderline and patient is requiring midodrine.  I will add amiodarone to help control heart rate (400 mg twice daily for 1 week then 200 mg daily thereafter).  He is not anticoagulated due to recent GI bleed.  2 history of nonischemic cardiomyopathy-blood pressure will not allow guideline directed medical therapy other than beta-blocker as outlined.  3 history of chronic hypotension-continue midodrine.  4 CAD-cardiomyopathy of proportion to coronary artery disease.  Continue statin.  5 history of end-stage renal disease-dialysis per nephrology.  6 Status post right hemiarthroplasty-Per orthopedics  7 Elevated TSH-Per primary care  For questions or updates, please contact [place] Please consult [email or web address] for contact info under        Signed, Kirk Ruths, MD  07/07/2021, 8:56 AM

## 2021-07-07 NOTE — Progress Notes (Signed)
FPTS Brief Progress Note  S:Went bedside to see patient. Patient sleeping, did not disturb   O: BP 101/76 (BP Location: Left Arm)   Pulse (!) 119   Temp 97.7 F (36.5 C) (Oral)   Resp 18   Ht 5\' 11"  (1.803 m)   Wt 97.6 kg   SpO2 97%   BMI 30.01 kg/m     A/P: - Plans per day team - Orders reviewed. Labs for AM ordered, which was adjusted as needed.   Holley Bouche, MD 07/07/2021, 4:00 AM PGY-1, [facility] Night Resident  Please page [phone] with questions.

## 2021-07-07 NOTE — Progress Notes (Signed)
Daily Progress Note Intern Pager: [phone]  Patient name: Blake West Medical record number: [id number] Date of birth: 1950/10/14 Age: 71 y.o. Gender: male  Primary Care Provider: Leonel Ramsay, MD Consultants: Cardiology, nephrology, orthopedics, chaplain Code Status: DNR (changed today)  Pt Overview and Major Events to Date:  6/10: admitted 6/12: right hip hemiarthroplasty 6/14: changed to DNR  Assessment and Plan: Blake West is a 71 y.o. male who presented with right femoral neck fracture now status post right hip hemiarthroplasty and stable for discharge to SNF. Pertinent PMH/PSH includes ESRD on HD, ANCA associated vasculitis, CAD, HFmrEF, NICM, chronic atrial fibrillation with RVR, HTN, thrombocytopenia, diet controlled T2DM, OSA, hypothyroidism. * Displaced fracture of right femoral neck (HCC) Now s/p R hip hemiarthroplasty. Pain adequately controlled. Has minimally required PRN pain medications. -Ortho following, appreciate recommendations -PT/OT eval and treat Cefadroxil 500mg  BID x7 days, per orthopedics, today is day 2/7 -Pain control with Tylenol 650 mg q6h, Oxy 2.5 mg q4h PRN, Fentanyl 50 mcg q2h for breakthrough pain -Delirium precautions (hx of this on prior admissions) -Continue Os-cal, Vitamin D supplementation  -Consider starting bisphosphate prior to d/c   End stage renal disease on dialysis (HCC) Stable.  HD on TTS. -Nephrology following -RFP on HD days only -Avoid nephrotoxic agents -Midodrine 3 times weekly with HD   ANCA-associated vasculitis (HCC) Currently on Prednisone daily and Bactrim 3 times weekly for PCP ppx.  -continue prednisone taper 30 mg daily, then 20 mg daily on 6/15 -Continue Bactrim MWF  Chronic atrial fibrillation with RVR (HCC) Generally remains in A-fib with RVR, asymptomatic. HR continues to be around 110s. Patient has declined aggressive rate control in the past.  Not on anticoagulation due to history of GI bleed.   Cardiology is opted to start amiodarone for rate and rhythm control. -Cardiology following, appreciate recommendations -tele monitoring -Increase metoprolol tartrate to 37.5 mg twice daily -Amiodarone 400mg  BID x 7 days then followed by 200mg  daily -hold anticoagulation   Heart failure with mid-range ejection fraction (HCC) Recent TTE showing EF 40 to 45%.  Appears mildly volume overloaded on exam with 2+ pitting edema.  - TED hose to mobilize fluid  Hypertension associated with diabetes ([place]) Adequately controlled ranging 90s-110s/70s-80s. - continue metoprolol tartrate 37.5mg  BID  Macrocytic anemia Hgb stable at 8.9. Given stability for several days, will not continue to monitor via labs. -Monitor for signs/symptoms of bleeding. -Continue Protonix 40 mg BID for chronic ulcers -Continue B vitamin supplementation  FEN/GI: Carb modified PPx: SCDs, anticoagulation contraindicated given anemia 2/2 chronic ulcers Dispo:SNF. Barriers include placement.   Subjective:  Patient requesting assistance with dentures and sitting up in bed so he can eat breakfast.  States his pain is 3/10 and even further improved with pain medications.  Objective: Temp:  [97.6 F (36.4 C)-97.8 F (36.6 C)] 97.8 F (36.6 C) (06/14 1505) Pulse Rate:  [116-132] 132 (06/14 1505) Resp:  [14-22] 16 (06/14 1520) BP: (93-113)/(71-76) 113/71 (06/14 1520) SpO2:  [92 %-99 %] 95 % (06/14 1505) Physical Exam: General: Awake, alert, working on breakfast, NAD Cardiovascular: Irregularly irregular, tachycardic, no murmurs auscultated Respiratory: CTA B, [place] in place on 4 L  Laboratory: Most recent CBC Lab Results  Component Value Date   WBC 5.8 07/07/2021   HGB 8.9 (L) 07/07/2021   HCT 27.1 (L) 07/07/2021   MCV 98.2 07/07/2021   PLT 129 (L) 07/07/2021   Most recent BMP    Latest Ref Rng & Units 07/07/2021  1:37 AM  BMP  Glucose 70 - 99 mg/dL 122   BUN 8 - 23 mg/dL 76   Creatinine 0.61 - 1.24 mg/dL 5.39    Sodium 135 - 145 mmol/L 135   Potassium 3.5 - 5.1 mmol/L 4.4   Chloride 98 - 111 mmol/L 95   CO2 22 - 32 mmol/L 24   Calcium 8.9 - 10.3 mg/dL 7.6    Imaging/Diagnostic Tests: No recent imaging results. Wells Guiles, DO 07/07/2021, 4:06 PM  PGY-1, [place] Intern pager: [phone], text pages welcome Secure chat group [place]

## 2021-07-07 NOTE — Evaluation (Signed)
Occupational Therapy Evaluation Patient Details Name: Blake West MRN: [id number] DOB: 06/17/1950 Today's Date: 07/07/2021   History of Present Illness Pt is a 71 y.o. F who presents 07/03/2021 after a fall with a right femoral neck fracture now s/p hemiarthroplasty. Significant PMH: ESRD on HD, ANCA associated vasculitis, CAD, HFrEF, NICM, chronic atrial fibrillation with RVR, HTN, thrombocytopenia, DM2.   Clinical Impression   PTA, pt lives with spouse, ambulatory with RW and pt reports typically able to manage ADLs without assist. Pt presents with deficits in sitting/standing balance, cardiopulmonary endurance, strength and cognition. Pt with minor confusion throughout session but able to follow directions consistently with multimodal cues. Pt requires Mod A  x 2 for bed mobility and sit to stand transfers with [facility]. Pt requires Min A for UB ADL and Max A-Total A for LB ADLs (+2 if in standing). As pt is significantly below baseline and does not have the physical assist currently needed to safely return home, recommend SNF rehab at [place].  HR up to 133 with activity, 1/4 DOE      Recommendations for follow up therapy are one component of a multi-disciplinary discharge planning process, led by the attending physician.  Recommendations may be updated based on patient status, additional functional criteria and insurance authorization.   Follow Up Recommendations  Skilled nursing-short term rehab (<3 hours/day)    Assistance Recommended at Discharge Frequent or constant Supervision/Assistance  Patient can return home with the following Two people to help with walking and/or transfers;Two people to help with bathing/dressing/bathroom    Functional Status Assessment  Patient has had a recent decline in their functional status and demonstrates the ability to make significant improvements in function in a reasonable and predictable amount of time.  Equipment Recommendations  Other (comment) (TBD  pending progress)    Recommendations for Other Services       Precautions / Restrictions Precautions Precautions: Fall Precaution Comments: Monitor HR/O2 Restrictions Weight Bearing Restrictions: Yes RLE Weight Bearing: Weight bearing as tolerated      Mobility Bed Mobility Overal bed mobility: Needs Assistance Bed Mobility: Supine to Sit     Supine to sit: Mod assist, +2 for physical assistance, +2 for safety/equipment, HOB elevated     General bed mobility comments: cues to initiate task and reach for bedrail, able to advance L LE and assist with R LE. increased assist needed to lift trunk and scoot EOB    Transfers Overall transfer level: Needs assistance Equipment used: Ambulation equipment used Transfers: Sit to/from Stand, Bed to chair/wheelchair/BSC Sit to Stand: Mod assist, +2 physical assistance, +2 safety/equipment, From elevated surface           General transfer comment: Mod A x 2 to power up from bed into Stedy (reports some discomfort with placing R stedy pad second rather than first) and transferred to chair in this manner. Min A to stand from [employer] prior to sititng in Management consultant: Stedy    Balance Overall balance assessment: Needs assistance Sitting-balance support: Feet supported, No upper extremity supported Sitting balance-Leahy Scale: Poor Sitting balance - Comments: initial posterior lean with difficulty correcting. Requiring Min A to min guard Postural control: Posterior lean Standing balance support: Bilateral upper extremity supported, During functional activity Standing balance-Leahy Scale: Poor                             ADL either performed or assessed with clinical judgement  ADL Overall ADL's : Needs assistance/impaired Eating/Feeding: Set up;Sitting Eating/Feeding Details (indicate cue type and reason): noted to have increased spillage in bed/on gown after breakfast Grooming: Min  guard;Sitting;Wash/dry face Grooming Details (indicate cue type and reason): cues to initiate Upper Body Bathing: Minimal assistance;Sitting Upper Body Bathing Details (indicate cue type and reason): assist for back Lower Body Bathing: Maximal assistance;+2 for physical assistance;+2 for safety/equipment;Sit to/from stand   Upper Body Dressing : Minimal assistance;Sitting Upper Body Dressing Details (indicate cue type and reason): to change gowns sitting EOB, cues for problem solving and sequencing Lower Body Dressing: Maximal assistance;+2 for safety/equipment;+2 for physical assistance;Sit to/from Health and safety inspector Details (indicate cue type and reason): simulated to chair with [employer]- Clothing Manipulation and Hygiene: +2 for physical assistance;Total assistance;+2 for safety/equipment;Sit to/from stand         General ADL Comments: Pain more controlled than PT eval and pt able to progress OOB. Limited by cognition, poor endurance and weakness     Vision Baseline Vision/History: 1 Wears glasses Ability to See in Adequate Light: 0 Adequate Patient Visual Report: No change from baseline Vision Assessment?: No apparent visual deficits     Perception     Praxis      Pertinent Vitals/Pain Pain Assessment Pain Assessment: Faces Faces Pain Scale: Hurts little more Pain Location: R hip Pain Descriptors / Indicators: Operative site guarding, Sharp, Grimacing Pain Intervention(s): Monitored during session, Premedicated before session, Limited activity within patient's tolerance     Hand Dominance Right   Extremity/Trunk Assessment Upper Extremity Assessment Upper Extremity Assessment: Generalized weakness   Lower Extremity Assessment Lower Extremity Assessment: Defer to PT evaluation   Cervical / Trunk Assessment Cervical / Trunk Assessment: Kyphotic   Communication Communication Communication: HOH   Cognition Arousal/Alertness: Awake/alert Behavior  During Therapy: WFL for tasks assessed/performed, Anxious Overall Cognitive Status: Impaired/Different from baseline Area of Impairment: Following commands, Safety/judgement, Awareness, Problem solving, Attention, Memory                   Current Attention Level: Selective Memory: Decreased short-term memory Following Commands: Follows one step commands with increased time Safety/Judgement: Decreased awareness of deficits Awareness: Emergent Problem Solving: Difficulty sequencing, Requires verbal cues General Comments: confusion noted with pt difficulty answering PLOF questions, sequencing and maintaining attention to tasks. follows directions with multimodal cues and increased tiime     General Comments  [place] out of pt nose on entry (4 L O2 on wall) with SpO2 reading in the 90s. Poor pleth signal throughout session, mild SOB noted    Exercises     Shoulder Instructions      Home Living Family/patient expects to be discharged to:: Private residence Living Arrangements: Spouse/significant other Available Help at Discharge: Family;Available 24 hours/day Type of Home: House Home Access: Stairs to enter [employer] of Steps: 3 Entrance Stairs-Rails: Right;Left;Can reach both Home Layout: Two level;Able to live on main level with bedroom/bathroom Alternate Level Stairs-Number of Steps: Flight   Bathroom Shower/Tub: Walk-in shower         Home Equipment: Conservation officer, nature (2 wheels);Wheelchair - Brewing technologist;Other (comment) (lift chair)          Prior Functioning/Environment Prior Level of Function : Needs assist;Patient poor historian/Family not available             Mobility Comments: using RW for mobility though pt reports this is what caused his fall ADLs Comments: Pt reports able to complete ADLs "most of the time".  When asked who helps him when he has difficulty, pt reports he does it himself...unclear on true PLOF        OT Problem List:  Decreased strength;Decreased activity tolerance;Impaired balance (sitting and/or standing);Decreased cognition;Decreased coordination;Decreased safety awareness;Decreased knowledge of use of DME or AE;Decreased knowledge of precautions;Pain;Cardiopulmonary status limiting activity      OT Treatment/Interventions: Self-care/ADL training;Therapeutic exercise;Energy conservation;DME and/or AE instruction;Therapeutic activities;Patient/family education;Balance training    OT Goals(Current goals can be found in the care plan section) Acute Rehab OT Goals Patient Stated Goal: pain control, go home OT Goal Formulation: With patient Time For Goal Achievement: 07/21/21 Potential to Achieve Goals: Good  OT Frequency: Min 2X/week    Co-evaluation              AM-PAC OT "6 Clicks" Daily Activity     Outcome Measure Help from another person eating meals?: None Help from another person taking care of personal grooming?: A Little Help from another person toileting, which includes using toliet, bedpan, or urinal?: Total Help from another person bathing (including washing, rinsing, drying)?: A Lot Help from another person to put on and taking off regular upper body clothing?: A Little Help from another person to put on and taking off regular lower body clothing?: A Lot 6 Click Score: 15   End of Session Equipment Utilized During Treatment: Gait belt Nurse Communication: Mobility status  Activity Tolerance: Patient tolerated treatment well Patient left: in chair;with call bell/phone within reach;with chair alarm set  OT Visit Diagnosis: Unsteadiness on feet (R26.81);Other abnormalities of gait and mobility (R26.89);Other symptoms and signs involving cognitive function                Time: 9794-8016 OT Time Calculation (min): 32 min Charges:  OT General Charges $OT Visit: 1 Visit OT Evaluation $OT Eval Moderate Complexity: 1 Mod OT Treatments $Self Care/Home Management : 8-22 mins  Blake West, OTR/L Acute Rehab Services Office: [phone]   Layla Maw 07/07/2021, 11:45 AM

## 2021-07-07 NOTE — Progress Notes (Signed)
Spoke with patient and patient's wife Susie at bedside.  Patient would like to go DNR.  Wife is also DNR.  Chaplain came by the room earlier, wife is currently filling out advance care planning paperwork.  Wife wants patient to stay at home and would not want him to go to a facility long-term but amenable to SNF short-term for rehab.  Wife would ultimately like patient to transition to peritoneal dialysis.  DNR order placed.

## 2021-07-07 NOTE — NC FL2 (Signed)
[place] MEDICAID FL2 LEVEL OF CARE SCREENING TOOL     IDENTIFICATION  Patient Name: Blake West Birthdate: [birth year] Sex: male Admission Date (Current Location): 07/03/2021  [facility] [place] Number:  Herbalist and Address:  [facility] [place], [place] [address] [place], [place] [address]      Provider Number: [id number]  Attending Physician Name and Address:  McDiarmid, Blane Ohara, MD  Relative Name and Phone Number:  Krew, Hortman [phone]    Current Level of Care: Hospital Recommended Level of Care: [place] Prior Approval Number:    Date Approved/Denied:   PASRR Number: [id number]  Discharge Plan: SNF    Current Diagnoses: Patient Active Problem List   Diagnosis Date Noted   Atrial fibrillation with rapid ventricular response ([place])    Confusion 07/04/2021   Displaced fracture of right femoral neck ([place]) 07/03/2021   Macrocytic anemia 07/03/2021   Thrombocytopenia ([place]) 07/03/2021   Chronic anticoagulation 05/16/2021   Rapidly progressive nephritic syndrome, diffuse crescentic glomerulonephritis 05/13/2021   End stage renal disease on dialysis ([place]) 05/03/2021   ANCA-associated vasculitis ([place])    Chronic atrial fibrillation with RVR ([place]) 04/19/2021   Heart failure with mid-range ejection fraction ([place]) 08/08/2019   Vitamin D deficiency 08/08/2019   Nonischemic cardiomyopathy ([place]) 06/27/2019   Coronary artery disease involving native coronary artery of native heart without angina pectoris 06/27/2019   Polycythemia 06/27/2019   Hypothyroidism 05/11/2019   Gout of multiple sites 05/11/2019   Type 2 diabetes mellitus ([place]) 04/26/2019   Hyperlipidemia 04/26/2019   Hypertension associated with diabetes ([place]) 04/25/2019   OSA (obstructive sleep apnea) 04/25/2019    Orientation RESPIRATION BLADDER Height & Weight     Self, Time, Situation, Place  O2 Continent, External catheter Weight: 215 lb 2.7 oz (97.6  kg) Height:  5\' 11"  (180.3 cm)  BEHAVIORAL SYMPTOMS/MOOD NEUROLOGICAL BOWEL NUTRITION STATUS      Continent Diet (see discharge summary)  AMBULATORY STATUS COMMUNICATION OF NEEDS Skin   Total Care Verbally Surgical wounds                       Personal Care Assistance Level of Assistance  Bathing, Feeding, Dressing Bathing Assistance: Maximum assistance Feeding assistance: Limited assistance Dressing Assistance: Maximum assistance     Functional Limitations Info  Sight, Hearing, Speech Sight Info: Adequate Hearing Info: Adequate Speech Info: Adequate    SPECIAL CARE FACTORS FREQUENCY  PT (By licensed PT), OT (By licensed OT)     PT Frequency: 5x week OT Frequency: 5x week            Contractures Contractures Info: Not present    Additional Factors Info  Code Status, Allergies Code Status Info: DNR Allergies Info: Codeine, Citalopram, Gabapentin, Lyrica (Pregabalin)           Current Medications (07/07/2021):  This is the current hospital active medication list Current Facility-Administered Medications  Medication Dose Route Frequency Provider Last Rate Last Admin   acetaminophen (TYLENOL) tablet 650 mg  650 mg Oral Q6H Willaim Sheng, MD   650 mg at 07/07/21 1051   Or   acetaminophen (TYLENOL) suppository 650 mg  650 mg Rectal Q6H Willaim Sheng, MD       amiodarone (PACERONE) tablet 400 mg  400 mg Oral BID Ralph Dowdy, RPH   400 mg at 07/07/21 1050   Followed by   Derrill Memo ON 07/14/2021] amiodarone (PACERONE) tablet 200 mg  200  mg Oral Daily Laurey Arrow M, RPH       atorvastatin (LIPITOR) tablet 80 mg  80 mg Oral Daily Wells Guiles, DO   80 mg at 07/07/21 0847   calcium-vitamin D (OSCAL WITH D) 500-5 MG-MCG per tablet 1 tablet  1 tablet Oral Q breakfast Willaim Sheng, MD   1 tablet at 07/07/21 0848   cefadroxil (DURICEF) capsule 500 mg  500 mg Oral Daily Willaim Sheng, MD   500 mg at 07/06/21 2251   Chlorhexidine Gluconate Cloth  2 % PADS 6 each  6 each Topical Daily Willaim Sheng, MD   6 each at 07/06/21 1429   Chlorhexidine Gluconate Cloth 2 % PADS 6 each  6 each Topical Q0600 Dwana Melena, MD   6 each at 07/07/21 0530   metoprolol tartrate (LOPRESSOR) tablet 37.5 mg  37.5 mg Oral BID Lelon Perla, MD   37.5 mg at 07/07/21 0847   midodrine (PROAMATINE) tablet 10 mg  10 mg Oral Q T,Th,Sa-HD McDiarmid, Blane Ohara, MD   10 mg at 07/06/21 1146   omega-3 acid ethyl esters (LOVAZA) capsule 1 g  1 g Oral BID Willaim Sheng, MD   1 g at 07/07/21 0848   oxyCODONE (Oxy IR/ROXICODONE) immediate release tablet 2.5-5 mg  2.5-5 mg Oral Q4H PRN Willaim Sheng, MD   5 mg at 07/07/21 0849   pantoprazole (PROTONIX) EC tablet 40 mg  40 mg Oral BID Willaim Sheng, MD   40 mg at 07/07/21 0847   polyethylene glycol (MIRALAX / GLYCOLAX) packet 17 g  17 g Oral Daily PRN Willaim Sheng, MD       [START ON 07/08/2021] predniSONE (DELTASONE) tablet 20 mg  20 mg Oral Q breakfast Willaim Sheng, MD       QUEtiapine (SEROQUEL) tablet 100 mg  100 mg Oral QHS Willaim Sheng, MD   100 mg at 07/06/21 2130   sevelamer carbonate (RENVELA) tablet 1,600 mg  1,600 mg Oral TID WC Dwana Melena, MD   1,600 mg at 07/07/21 1218   sulfamethoxazole-trimethoprim (BACTRIM DS) 800-160 MG per tablet 1 tablet  1 tablet Oral Once per day on Mon Wed Fri Willaim Sheng, MD   1 tablet at 07/07/21 0848   vitamin C (ASCORBIC ACID) tablet 125 mg  125 mg Oral Daily Willaim Sheng, MD   125 mg at 07/07/21 0848     Discharge Medications: Please see discharge summary for a list of discharge medications.  Relevant Imaging Results:  Relevant Lab Results:   Additional Information SSN: [id number].  Pt is HD at [place] on TTS. Pt arrives at 5:45 am for 6:00 am chair time.  Joanne Chars, LCSW

## 2021-07-07 NOTE — Progress Notes (Signed)
[facility] Progress Note   71 y.o. male CAD, h/o GIB, chronic combined diastolic and systolic CHF, HTN, DM type 2, HLD, atrial fibrillation with RVR (not on coagulation due to recent GIB on 05/11/21), and HD-dependent AKI due to ANCA vasculitis and recent start on HD in April 2023 who presented to [facility] ED on 07/03/21 after falling at home.  Xray noted right displaced femoral neck fracture and was admitted for surgical intervention.    His wife reports that he has been having a tough time with HD due to hypotension and inability to sit in recliner for 4 hours due to discomfort and agitation.  He requires midodrine prior to HD.  Dialysis Orders: Center: [facility]  on TTS  on [address] 3hr 45, EDW 93kg  Nipro  400/500 HCO3 39 T37 2/2.5 bath RIJ TC   Assessment/ Plan:    Displaced right femoral neck fracture - Ortho right hip arthroplasty on 07/05/21.  ESRD -  Will plan to continue with TTS schedule.   Tolerated HD 6/13 w/ 1.4L net UF  No absolute indication for RRT and the patient appears to be  comfortable; TED hose to mobilize fluid. He's still up on his EDW  Next HD TTS regimen.   Hypertension/volume  - has 1-2 + edema but RVR and low BP.  Will UF as tolerated and continue with midodrine before HD.  Anemia  - Will check iron stores (not sent yet);  ESA as needed.  Metabolic bone disease -  continue with home meds, phos 10.4. Not on a binder as outpt yet -> will start on renvela 2 tabs TIDM  Nutrition - renal diet carb, modified  ANCA vasculitis - s/p rituximab in April.  Per his wife, the biopsy revealed "a lot of scarring" and don't believe he will regain kidney function.  Continue with prednisone taper and PCP prophylaxis with Bactrim.  Atrial fibrillation with RVR - not on anticoagulation due to recent GI bleed.  Cardiology consulted for rate control prior to surgery.  Chronic combined systolic and diastolic CHF - has edema but UF limited by hypotension and A  fib RVR.  Will UF as tolerated with HD and continue to use midodrine for BP support.  Hyperkalemia - dosed Lokelma and follow; HD today.   Subjective:   Hungry, but otherwise doing well. Denies dyspnea, fever, chills, nausea Main complaints are the condom cath coming off, leaking and back pain from shingles.   Objective:   BP 103/75   Pulse (!) 118   Temp 97.6 F (36.4 C) (Oral)   Resp 16   Ht 5\' 11"  (1.803 m)   Wt 97.6 kg   SpO2 99%   BMI 30.01 kg/m   Intake/Output Summary (Last 24 hours) at 07/07/2021 4259 Last data filed at 07/07/2021 0531 Gross per 24 hour  Intake --  Output 502.4 ml  Net -502.4 ml   Weight change:   Physical Exam: General appearance: NAD Head: NCAT Eyes: negative scleral icterus Resp: clear to auscultation bilaterally Cardio: irregularly irregular rhythm  GI: SNDNT + BS Extremities: edema 1-2+ pretibial edema bilaterally  Access: RIJ TC  Imaging: DG HIP UNILAT W OR W/O PELVIS 2-3 VIEWS RIGHT  Result Date: 07/05/2021 CLINICAL DATA:  Postoperative. EXAM: DG HIP (WITH OR WITHOUT PELVIS) 2-3V RIGHT COMPARISON:  Pelvis and right hip radiographs 07/03/2021 FINDINGS: Interval total right hip arthroplasty. No perihardware lucency is seen to indicate hardware failure or loosening. Expected postoperative lateral hip soft tissue swelling and  mild subcutaneous air. Moderate left femoroacetabular joint space narrowing. Mild-to-moderate superior pubic symphysis joint space narrowing. IMPRESSION: Status post total right hip arthroplasty without evidence of hardware failure. Electronically Signed   By: Yvonne Kendall M.D.   On: 07/05/2021 11:55    Labs: BMET Recent Labs  Lab 07/03/21 1620 07/04/21 0553 07/04/21 2219 07/05/21 0317 07/06/21 0120 07/07/21 0137  NA 138 138 135 136 135 135  K 4.7 5.1 5.2* 5.0 5.9* 4.4  CL 97* 98 96* 99 96* 95*  CO2 24 25 23 22  19* 24  GLUCOSE 137* 117* 157* 109* 158* 122*  BUN 74* 80* 92* 94* 110* 76*  CREATININE 4.67* 5.36*  6.05* 6.16* 6.82* 5.39*  CALCIUM 8.1* 8.2* 8.3* 8.0* 7.9* 7.6*  PHOS  --  7.1*  --  9.0* 10.4* 7.1*   CBC Recent Labs  Lab 07/03/21 1620 07/04/21 0553 07/05/21 0317 07/06/21 0120 07/07/21 0137  WBC 7.3 7.4 5.5 6.8 5.8  NEUTROABS 5.3  --   --   --   --   HGB 8.8* 8.9* 8.1* 9.7* 8.9*  HCT 27.3* 27.1* 24.8* 28.8* 27.1*  MCV 102.6* 102.3* 101.2* 97.6 98.2  PLT 145* 133* 134* 132* 129*    Medications:     acetaminophen  650 mg Oral Q6H   Or   acetaminophen  650 mg Rectal Q6H   atorvastatin  80 mg Oral Daily   calcium-vitamin D  1 tablet Oral Q breakfast   cefadroxil  500 mg Oral Daily   Chlorhexidine Gluconate Cloth  6 each Topical Daily   Chlorhexidine Gluconate Cloth  6 each Topical Q0600   metoprolol tartrate  37.5 mg Oral BID   midodrine  10 mg Oral Q T,Th,Sa-HD   omega-3 acid ethyl esters  1 g Oral BID   pantoprazole  40 mg Oral BID   [START ON 07/08/2021] predniSONE  20 mg Oral Q breakfast   predniSONE  30 mg Oral Q breakfast   QUEtiapine  100 mg Oral QHS   sevelamer carbonate  1,600 mg Oral TID WC   sodium zirconium cyclosilicate  10 g Oral Daily   sulfamethoxazole-trimethoprim  1 tablet Oral Once per day on Mon Wed Fri   vitamin C  125 mg Oral Daily      Otelia Santee, MD 07/07/2021, 8:22 AM

## 2021-07-07 NOTE — TOC Initial Note (Addendum)
Transition of Care [facility]) - Initial/Assessment Note    Patient Details  Name: Blake West MRN: [id number] Date of Birth: 02-12-1950  Transition of Care [facility]) CM/SW Contact:    Joanne Chars, LCSW Phone Number: 07/07/2021, 2:52 PM  Clinical Narrative:                  CSW met with pt regarding DC recommendation for SNF.  Pt initially declines, states he will be fine at home.  Permission given to speak with wife Blake West.  Pt lives at home with wife, no other services.  HD pt at: [facility] on TTS. Pt arrives at 5:45 am for 6:00 am chair time.  CSW attempted to call wife, no answer, unable to leave message.  1200: CSW spoke with pt wife Blake West in room, they are now agreeable to SNF,, choice document given, permission given to send out referral in hub.  Would like SNF in [place] due to HD there.  Expected Discharge Plan: Skilled Nursing Facility Barriers to Discharge: Continued Medical Work up, SNF Pending bed offer   Patient Goals and CMS Choice Patient states their goals for this hospitalization and ongoing recovery are:: "be independent" CMS Medicare.gov Compare Post Acute Care list provided to:: Patient Represenative (must comment) Choice offered to / list presented to : Spouse  Expected Discharge Plan and Services Expected Discharge Plan: Skilled Nursing Facility In-house Referral: Clinical Social Work Discharge Planning Services: CM Consult Post Acute Care Choice: [facility] Living arrangements for the past 2 months: [place]                                      Prior Living Arrangements/Services Living arrangements for the past 2 months: Single Family Home Lives with:: Spouse Patient language and need for interpreter reviewed:: Yes Do you feel safe going back to the place where you live?: Yes      Need for Family Participation in Patient Care: Yes (Comment) Care giver support system in place?: Yes (comment) Current  home services: DME (walker and BSC) Criminal Activity/Legal Involvement Pertinent to Current Situation/Hospitalization: No - Comment as needed  Activities of Daily Living Home Assistive Devices/Equipment: Walker (specify type) ADL Screening (condition at time of admission) Patient's cognitive ability adequate to safely complete daily activities?: Yes Is the patient deaf or have difficulty hearing?: No Does the patient have difficulty seeing, even when wearing glasses/contacts?: No Does the patient have difficulty concentrating, remembering, or making decisions?: Yes Patient able to express need for assistance with ADLs?: Yes Does the patient have difficulty dressing or bathing?: Yes Independently performs ADLs?: Yes (appropriate for developmental age) Does the patient have difficulty walking or climbing stairs?: Yes Weakness of Legs: Both Weakness of Arms/Hands: None  Permission Sought/Granted Permission sought to share information with : Family Supports Permission granted to share information with : Yes, Verbal Permission Granted  Share Information with NAME: wife Blake West  Permission granted to share info w AGENCY: SNF        Emotional Assessment Appearance:: Appears stated age Attitude/Demeanor/Rapport: Engaged Affect (typically observed): Pleasant Orientation: : Oriented to Self, Oriented to Place, Oriented to  Time, Oriented to Situation Alcohol / Substance Use: Not Applicable Psych Involvement: No (comment)  Admission diagnosis:  End stage renal disease on dialysis ([place]) [N18.6, Z99.2] Atrial fibrillation with rapid ventricular response ([place]) [I48.91] Fall, initial encounter [W19.XXXA] Closed right hip fracture, initial  encounter [facility]) [S72.001A] Displaced fracture of right femoral neck ([place]) [S72.001A] Closed displaced fracture of right femoral neck ([place]) [S72.001A] Patient Active Problem List   Diagnosis Date Noted   Atrial fibrillation with rapid ventricular response  ([place])    Confusion 07/04/2021   Displaced fracture of right femoral neck ([place]) 07/03/2021   Macrocytic anemia 07/03/2021   Thrombocytopenia ([place]) 07/03/2021   Chronic anticoagulation 05/16/2021   Rapidly progressive nephritic syndrome, diffuse crescentic glomerulonephritis 05/13/2021   End stage renal disease on dialysis ([place]) 05/03/2021   ANCA-associated vasculitis ([place])    Chronic atrial fibrillation with RVR ([place]) 04/19/2021   Heart failure with mid-range ejection fraction ([place]) 08/08/2019   Vitamin D deficiency 08/08/2019   Nonischemic cardiomyopathy ([place]) 06/27/2019   Coronary artery disease involving native coronary artery of native heart without angina pectoris 06/27/2019   Polycythemia 06/27/2019   Hypothyroidism 05/11/2019   Gout of multiple sites 05/11/2019   Type 2 diabetes mellitus ([place]) 04/26/2019   Hyperlipidemia 04/26/2019   Hypertension associated with diabetes ([place]) 04/25/2019   OSA (obstructive sleep apnea) 04/25/2019   PCP:  Leonel Ramsay, MD Pharmacy:   [facility] [address], [place] [address] [place] [address] Phone: [phone] Fax: [phone]     Social Determinants of Health (SDOH) Interventions    Readmission Risk Interventions    05/12/2021   12:23 PM 04/22/2021   12:24 PM  Readmission Risk Prevention Plan  Transportation Screening Complete Complete  PCP or Specialist Appt within 3-5 Days  Complete  Social Work Consult for [place] Planning/Counseling  Complete  Palliative Care Screening  Not Applicable  Medication Review Press photographer) Complete Complete  PCP or Specialist appointment within 3-5 days of discharge Complete   HRI or [place] Complete   SW Recovery Care/Counseling Consult Complete   Palliative Care Screening Complete   [place] Not Applicable

## 2021-07-07 NOTE — Progress Notes (Signed)
Pt receives out-pt HD at [facility] on TTS. Pt arrives at 5:45 am for 6:00 am chair time. Will assist as needed.   Melven Sartorius Renal Navigator [phone]

## 2021-07-07 NOTE — Progress Notes (Signed)
   07/07/21 1520  Assess: MEWS Score  BP 113/71  MAP (mmHg) 85  ECG Heart Rate (!) 113  Resp 16  Assess: MEWS Score  MEWS Temp 0  MEWS Systolic 0  MEWS Pulse 2  MEWS RR 0  MEWS LOC 0  MEWS Score 2  MEWS Score Color Yellow  Assess: SIRS CRITERIA  SIRS Temperature  0  SIRS Pulse 1  SIRS Respirations  0  SIRS WBC 0  SIRS Score Sum  1

## 2021-07-08 DIAGNOSIS — E038 Other specified hypothyroidism: Secondary | ICD-10-CM

## 2021-07-08 DIAGNOSIS — I4891 Unspecified atrial fibrillation: Secondary | ICD-10-CM | POA: Diagnosis not present

## 2021-07-08 LAB — RENAL FUNCTION PANEL
Albumin: 2.5 g/dL — ABNORMAL LOW (ref 3.5–5.0)
Anion gap: 16 — ABNORMAL HIGH (ref 5–15)
BUN: 89 mg/dL — ABNORMAL HIGH (ref 8–23)
CO2: 22 mmol/L (ref 22–32)
Calcium: 7.9 mg/dL — ABNORMAL LOW (ref 8.9–10.3)
Chloride: 97 mmol/L — ABNORMAL LOW (ref 98–111)
Creatinine, Ser: 5.92 mg/dL — ABNORMAL HIGH (ref 0.61–1.24)
GFR, Estimated: 10 mL/min — ABNORMAL LOW (ref >60)
Glucose, Bld: 151 mg/dL — ABNORMAL HIGH (ref 70–99)
Phosphorus: 7.8 mg/dL — ABNORMAL HIGH (ref 2.5–4.6)
Potassium: 4.6 mmol/L (ref 3.5–5.1)
Sodium: 135 mmol/L (ref 135–145)

## 2021-07-08 LAB — CBC
HCT: 27 % — ABNORMAL LOW (ref 39.0–52.0)
Hemoglobin: 9 g/dL — ABNORMAL LOW (ref 13.0–17.0)
MCH: 32.7 pg (ref 26.0–34.0)
MCHC: 33.3 g/dL (ref 30.0–36.0)
MCV: 98.2 fL (ref 80.0–100.0)
Platelets: 129 K/uL — ABNORMAL LOW (ref 150–400)
RBC: 2.75 MIL/uL — ABNORMAL LOW (ref 4.22–5.81)
RDW: 18 % — ABNORMAL HIGH (ref 11.5–15.5)
WBC: 4.8 K/uL (ref 4.0–10.5)
nRBC: 2.5 % — ABNORMAL HIGH (ref 0.0–0.2)

## 2021-07-08 MED ORDER — HEPARIN SODIUM (PORCINE) 1000 UNIT/ML IJ SOLN
INTRAMUSCULAR | Status: AC
  Administered 2021-07-08: 4.2 [IU]
  Filled 2021-07-08: qty 5

## 2021-07-08 NOTE — Plan of Care (Signed)
  Problem: Education: Goal: Knowledge of General Education information will improve Description: Including pain rating scale, medication(s)/side effects and non-pharmacologic comfort measures Outcome: Progressing   Problem: Clinical Measurements: Goal: Ability to maintain clinical measurements within normal limits will improve Outcome: Progressing Goal: Respiratory complications will improve Outcome: Progressing   Problem: Nutrition: Goal: Adequate nutrition will be maintained Outcome: Progressing   Problem: Elimination: Goal: Will not experience complications related to bowel motility Outcome: Progressing   Problem: Safety: Goal: Ability to remain free from injury will improve Outcome: Progressing

## 2021-07-08 NOTE — Assessment & Plan Note (Signed)
It is unclear why his Synthroid was not resumed. ?? Patient's refused to continue the meds per FMTS The resident will readdress this issue with him. Please, resume his Synthroid dose of 25 mg QD if he is amenable to restarting it.

## 2021-07-08 NOTE — Progress Notes (Signed)
PT Cancellation Note  Patient Details Name: JAVELLE DONIGAN MRN: [id number] DOB: December 31, 1950   Cancelled Treatment:    Reason Eval/Treat Not Completed: Patient at procedure or test/unavailable (HD). Will follow-up for PT treatment as schedule permits.  Mabeline Caras, PT, DPT Acute Rehabilitation Services  Pager [phone] Office [place] 07/08/2021, 10:21 AM

## 2021-07-08 NOTE — Progress Notes (Signed)
FPTS Brief Progress Note  S:went bedside to see patient. Patient sleeping, did not diturb.   O: BP (!) 125/93 (BP Location: Left Wrist)   Pulse (!) 117   Temp 97.8 F (36.6 C) (Oral)   Resp 17   Ht 5\' 11"  (1.803 m)   Wt 97.6 kg   SpO2 91%   BMI 30.01 kg/m     A/P: - Plans per day team - Orders reviewed. Labs for AM ordered, which was adjusted as needed.   Holley Bouche, MD 07/08/2021, 2:03 AM PGY-1, [facility] Night Resident  Please page [phone] with questions.

## 2021-07-08 NOTE — Progress Notes (Signed)
     Subjective:  Patient well overall regards to the right hip.  He is making gradual progress with physical therapy.  Encouraged him to mobilize from bed every single day to avoid further deconditioning.  He denies distal numbness and tingling.  They are hopeful for discharge to nursing facility soon.  Objective:   VITALS:   Vitals:   07/08/21 1030 07/08/21 1100 07/08/21 1111 07/08/21 1155  BP: 94/83 112/80 121/77 121/77  Pulse: (!) 126 82 (!) 110 (!) 110  Resp: 16 15 18 18   Temp:   97.6 F (36.4 C) 97.6 F (36.4 C)  TempSrc:   Oral   SpO2: 94% 92% 94%   Weight:   96.4 kg   Height:        Sensation intact distally Intact pulses distally Dorsiflexion/Plantar flexion intact Incision: dressing C/D/I No cellulitis present   Lab Results  Component Value Date   WBC 4.8 07/08/2021   HGB 9.0 (L) 07/08/2021   HCT 27.0 (L) 07/08/2021   MCV 98.2 07/08/2021   PLT 129 (L) 07/08/2021   BMET    Component Value Date/Time   NA 135 07/08/2021 0115   NA 138 06/26/2019 1416   K 4.6 07/08/2021 0115   CL 97 (L) 07/08/2021 0115   CO2 22 07/08/2021 0115   GLUCOSE 151 (H) 07/08/2021 0115   BUN 89 (H) 07/08/2021 0115   BUN 16 06/26/2019 1416   CREATININE 5.92 (H) 07/08/2021 0115   CALCIUM 7.9 (L) 07/08/2021 0115   GFRNONAA 10 (L) 07/08/2021 0115      Xray: Postop x-rays demonstrate cemented hip hemiarthroplasty in good position without adverse features    Assessment/Plan: 3 Days Post-Op   Principal Problem:   Displaced fracture of right femoral neck (HCC) Active Problems:   Heart failure with mid-range ejection fraction (HCC)   Chronic atrial fibrillation with RVR (HCC)   End stage renal disease on dialysis (HCC)   ANCA-associated vasculitis (HCC)   Macrocytic anemia   Atrial fibrillation with rapid ventricular response (HCC)  Status post right hip hemiarthroplasty 07/05/2021  Post op recs: WB: WBAT RLE Abx: ancef x23 hours post op, cefadroxil 500BID x7 days given  increased risk for infection because of dialysis, diabetes, hx of recent pneumonia Imaging: PACU xrays Dressing: Aquacel dressing to be kept intact until follow-up DVT prophylaxis: SCD's and early mobilization. Chemoprophylaxis contraindicated given patient's recent bleeding ulcer. Discussed with primary team. Follow up: 2 weeks after surgery for a wound check with Dr. Zachery Dakins at [facility].  Address: [address] [place], [place],  [address]  Office Phone: [phone]    Willaim Sheng 07/08/2021, 12:53 PM   Charlies Constable, MD  Contact information:   [phone] 7am-5pm epic message Dr. Zachery Dakins, or call office for patient follow up: [phone] After hours and holidays please check [email or web address] for group call information for [facility]

## 2021-07-08 NOTE — Progress Notes (Addendum)
Physical Therapy Treatment Patient Details Name: Blake West MRN: [id number] DOB: Jun 15, 1950 Today's Date: 07/08/2021   History of Present Illness Pt is a 71 y.o. male admitted 07/03/2021 after fall sustaining R femoral neck fx. S/p R hip hemiarthroplasty 6/12. PMH includes ESRD (HD TTS), ANCA associated vasculitis, CAD, HFrEF, NICM, chronic atrial fibrillation with RVR, HTN, thrombocytopenia, DM2.   PT Comments    Pt slowly progressing with mobility. Pt demonstrates improved strength, able to stand with RW and modA; but demonstrates decreased activity tolerance (pt post-HD this morning), requiring prolonged time to recover between short bouts of movement. Pt with significant SOB, which wife attributes to constipation; pt not forthcoming with reason for SOB and not wanting to progress activity beyond standing. Pt remains limited by generalized weakness, decreased activity tolerance, poor balance strategies/postural reactions and impaired cognition. Continue to recommend SNF-level therapies to maximize functional mobility and independence prior to return home.    Recommendations for follow up therapy are one component of a multi-disciplinary discharge planning process, led by the attending physician.  Recommendations may be updated based on patient status, additional functional criteria and insurance authorization.  Follow Up Recommendations  Skilled nursing-short term rehab (<3 hours/day)     Assistance Recommended at Discharge Frequent or constant Supervision/Assistance  Patient can return home with the following A lot of help with walking and/or transfers;A lot of help with bathing/dressing/bathroom;Assistance with cooking/housework;Assist for transportation;Help with stairs or ramp for entrance   Equipment Recommendations  Rolling walker (2 wheels);BSC/3in1;Hospital bed    Recommendations for Other Services       Precautions / Restrictions Precautions Precautions: Fall;Other  (comment) Precaution Comments: Watch HR and SpO2 (does not wear O2 baseline) Restrictions Weight Bearing Restrictions: Yes RLE Weight Bearing: Weight bearing as tolerated     Mobility  Bed Mobility Overal bed mobility: Needs Assistance Bed Mobility: Supine to Sit, Sit to Supine     Supine to sit: Mod assist, HOB elevated Sit to supine: Min assist   General bed mobility comments: increased time and effort, modA for scooting hips to EOB, pt frequently laying in partial sidelying with SOB (would not give reason why he needed break; did not like being pushed); return to supine with minA for RLE management    Transfers                   General transfer comment: prolonged seated rest break after scooting hips to EOB (able to fully clear buttocks with this), then scooting towards [facility] without assist, prolonged recover time after each movement (presume due to SOB? pt will not specify); with max encouragement, pt agreeable to attempt 1x standing trial, modA for trunk elevation    Ambulation/Gait               General Gait Details:  (pt declined; noted increased WOB and HR)   Stairs             Wheelchair Mobility    Modified Rankin (Stroke Patients Only)       Balance Overall balance assessment: Needs assistance Sitting-balance support: Feet supported, No upper extremity supported Sitting balance-Leahy Scale: Fair Sitting balance - Comments: prolonged sitting EOB without UE support, preference for at least single UE support   Standing balance support: Bilateral upper extremity supported, During functional activity, Reliant on assistive device for balance Standing balance-Leahy Scale: Poor  Cognition Arousal/Alertness: Awake/alert Behavior During Therapy: Anxious Overall Cognitive Status: Impaired/Different from baseline Area of Impairment: Attention, Memory, Following commands, Safety/judgement, Awareness, Problem  solving                   Current Attention Level: Sustained, Selective Memory: Decreased short-term memory, Decreased recall of precautions Following Commands: Follows one step commands with increased time Safety/Judgement: Decreased awareness of deficits Awareness: Emergent Problem Solving: Requires verbal cues General Comments: difficulty reasoning with pt regarding importance of OOB mobility (wife reports, "he's stubborn); pt with significant SOB, not giving reason why, wife reporting it's due to constipation which pt agrees with; appears anxious regarding mobility, pt will not confirm this, giving minimal responses to questions        Exercises      General Comments General comments (skin integrity, edema, etc.): SpO2 94% on 3L O2 Early (when pleth reliable); HR 120s with activity; DOE 3-4/4, which wife attributes to his constipation(?).      Pertinent Vitals/Pain Pain Assessment Pain Assessment: Faces Faces Pain Scale: Hurts little more Pain Location: R hip Pain Descriptors / Indicators: Grimacing, Sore Pain Intervention(s): Monitored during session, Limited activity within patient's tolerance, Repositioned    Home Living                          Prior Function            PT Goals (current goals can now be found in the care plan section) Progress towards PT goals: Progressing toward goals (slowly)    Frequency    Min 3X/week      PT Plan Current plan remains appropriate    Co-evaluation              AM-PAC PT "6 Clicks" Mobility   Outcome Measure  Help needed turning from your back to your side while in a flat bed without using bedrails?: A Little Help needed moving from lying on your back to sitting on the side of a flat bed without using bedrails?: A Lot Help needed moving to and from a bed to a chair (including a wheelchair)?: A Lot Help needed standing up from a chair using your arms (e.g., wheelchair or bedside chair)?: A  Lot Help needed to walk in hospital room?: Total Help needed climbing 3-5 steps with a railing? : Total 6 Click Score: 11    End of Session Equipment Utilized During Treatment: Gait belt;Oxygen Activity Tolerance: Other (comment) (self-limiting? DOE? fatigue?) Patient left: in bed;with call bell/phone within reach;with bed alarm set;with family/visitor present Nurse Communication: Mobility status PT Visit Diagnosis: Other abnormalities of gait and mobility (R26.89);Pain Pain - Right/Left: Right Pain - part of body: Hip     Time: 4825-0037 PT Time Calculation (min) (ACUTE ONLY): 22 min  Charges:  $Therapeutic Activity: 8-22 mins                     Mabeline Caras, PT, DPT Acute Rehabilitation Services  Pager [phone] Office [place] 07/08/2021, 5:01 PM

## 2021-07-08 NOTE — Progress Notes (Signed)
Progress Note  Patient Name: Blake West Date of Encounter: 07/08/2021  [facility] Cardiologist: Nelva Bush, MD   Subjective   Continues to complain of leg/hip pain but improved; no CP  Inpatient Medications    Scheduled Meds:  acetaminophen  650 mg Oral Q6H   Or   acetaminophen  650 mg Rectal Q6H   amiodarone  400 mg Oral BID   Followed by   Derrill Memo ON 07/14/2021] amiodarone  200 mg Oral Daily   atorvastatin  80 mg Oral Daily   calcium-vitamin D  1 tablet Oral Q breakfast   cefadroxil  500 mg Oral Daily   Chlorhexidine Gluconate Cloth  6 each Topical Daily   Chlorhexidine Gluconate Cloth  6 each Topical Q0600   metoprolol tartrate  37.5 mg Oral BID   midodrine  10 mg Oral Q T,Th,Sa-HD   omega-3 acid ethyl esters  1 g Oral BID   pantoprazole  40 mg Oral BID   predniSONE  20 mg Oral Q breakfast   QUEtiapine  100 mg Oral QHS   sevelamer carbonate  1,600 mg Oral TID WC   sulfamethoxazole-trimethoprim  1 tablet Oral Once per day on Mon Wed Fri   vitamin C  125 mg Oral Daily   Continuous Infusions:   PRN Meds: oxyCODONE, polyethylene glycol   Vital Signs    Vitals:   07/07/21 2048 07/07/21 2332 07/08/21 0426 07/08/21 0737  BP: (!) 124/106 (!) 125/93 (!) 148/115 104/80  Pulse: (!) 111 (!) 117 (!) 115 (!) 110  Resp: 15 17 16 15   Temp: 97.8 F (36.6 C) 97.8 F (36.6 C) 98 F (36.7 C) 97.8 F (36.6 C)  TempSrc: Oral Oral Oral Oral  SpO2: 94% 91% 97%   Weight:      Height:        Intake/Output Summary (Last 24 hours) at 07/08/2021 0748 Last data filed at 07/08/2021 0600 Gross per 24 hour  Intake 120 ml  Output 100 ml  Net 20 ml       07/06/2021   10:30 AM 07/06/2021    7:40 AM 07/04/2021    3:30 AM  Last 3 Weights  Weight (lbs) 215 lb 2.7 oz 217 lb 13 oz 210 lb 5.1 oz  Weight (kg) 97.6 kg 98.8 kg 95.4 kg      Telemetry    Atrial fibrillation rate upper normal to mildly elevated - Personally Reviewed  Physical Exam   GEN: NAD Neck: No  JVD Cardiac: irregular, tachycardic Respiratory: CTA; no wheeze GI: Soft, NT/ND, no masses MS: No edema; s/p hip surgery Neuro:  No focal findings Psych: Normal affect   Labs   Chemistry Recent Labs  Lab 07/03/21 1620 07/04/21 0553 07/04/21 2219 07/05/21 0317 07/06/21 0120 07/07/21 0137 07/08/21 0115  NA 138   < > 135   < > 135 135 135  K 4.7   < > 5.2*   < > 5.9* 4.4 4.6  CL 97*   < > 96*   < > 96* 95* 97*  CO2 24   < > 23   < > 19* 24 22  GLUCOSE 137*   < > 157*   < > 158* 122* 151*  BUN 74*   < > 92*   < > 110* 76* 89*  CREATININE 4.67*   < > 6.05*   < > 6.82* 5.39* 5.92*  CALCIUM 8.1*   < > 8.3*   < > 7.9* 7.6* 7.9*  MG  --   --  2.1  --   --   --   --   PROT 5.7*  --   --   --   --   --   --   ALBUMIN 3.1*   < >  --    < > 2.9* 2.7* 2.5*  AST 24  --   --   --   --   --   --   ALT 31  --   --   --   --   --   --   ALKPHOS 39  --   --   --   --   --   --   BILITOT 0.7  --   --   --   --   --   --   GFRNONAA 13*   < > 9*   < > 8* 11* 10*  ANIONGAP 17*   < > 16*   < > 20* 16* 16*   < > = values in this interval not displayed.     Lipids  Recent Labs  Lab 07/04/21 0553  CHOL 200  TRIG 197*  HDL 37*  LDLCALC 124*  CHOLHDL 5.4     Hematology Recent Labs  Lab 07/06/21 0120 07/07/21 0137 07/08/21 0115  WBC 6.8 5.8 4.8  RBC 2.95* 2.76* 2.75*  HGB 9.7* 8.9* 9.0*  HCT 28.8* 27.1* 27.0*  MCV 97.6 98.2 98.2  MCH 32.9 32.2 32.7  MCHC 33.7 32.8 33.3  RDW 17.9* 18.1* 18.0*  PLT 132* 129* 129*    Thyroid  Recent Labs  Lab 07/04/21 0553  TSH 12.911*         Patient Profile     71 y.o. male with past medical history of permanent atrial fibrillation not on anticoagulation due to GI bleed, end-stage renal disease dialysis, nonischemic cardiomyopathy admitted with right femoral neck fracture.  Cardiology asked to evaluate preoperatively and for atrial fibrillation.  Echocardiogram April 2023 showed ejection fraction 40 to 45%, global hypokinesis, mild to  moderate left atrial enlargement, mild mitral regurgitation, mildly dilated aortic root at 43 mm.  Assessment & Plan    1 Permanent atrial fibrillation-heart rate upper normal to mildly elevated.  Continue metoprolol.  Given that his blood pressure has been borderline and he requires midodrine I have elected to add amiodarone to assist with heart rate control.  Continue amiodarone load (400 mg twice daily to complete full 1 week then 200 mg daily thereafter).  No anticoagulation given history of GI bleed.    2 history of nonischemic cardiomyopathy-blood pressure will not allow guideline directed medical therapy other than beta-blocker as outlined.  3 history of chronic hypotension-continue midodrine.  4 CAD-cardiomyopathy of proportion to coronary artery disease.  Continue statin.  5 history of end-stage renal disease-dialysis per nephrology.  6 Status post right hemiarthroplasty-Per orthopedics  7 Elevated TSH-Per primary care  For questions or updates, please contact [place] Please consult [email or web address] for contact info under        Signed, Kirk Ruths, MD  07/08/2021, 7:48 AM

## 2021-07-08 NOTE — Progress Notes (Signed)
[facility] Progress Note   71 y.o. male CAD, h/o GIB, chronic combined diastolic and systolic CHF, HTN, DM type 2, HLD, atrial fibrillation with RVR (not on coagulation due to recent GIB on 05/11/21), and HD-dependent AKI due to ANCA vasculitis and recent start on HD in April 2023 who presented to [facility] ED on 07/03/21 after falling at home.  Xray noted right displaced femoral neck fracture and was admitted for surgical intervention.    His wife reports that he has been having a tough time with HD due to hypotension and inability to sit in recliner for 4 hours due to discomfort and agitation.  He requires midodrine prior to HD.  Dialysis Orders: Center: [facility]  on TTS  on [address] 3hr 45, EDW 93kg  Nipro  400/500 HCO3 39 T37 2/2.5 bath RIJ TC   Assessment/ Plan:    Displaced right femoral neck fracture - Ortho right hip arthroplasty on 07/05/21.  ESRD -  Will plan to continue with TTS schedule.   Tolerated HD 6/13 w/ 1.4L net UF  No absolute indication for RRT and the patient appears to be  comfortable; TED hose to mobilize fluid. He's still up on his EDW; he has shingles and also with right hip arthroplasty. He's insistent on not staying on for more than 2-1/2 hours but we negotiated and he agrees to stay on for 3hrs to help with UF.  Continue HD TTS regimen; HD today but he's only willing to stay for 3 hrs. Albumin if needed.    Hypertension/volume  - has 1-2 + edema but RVR and low BP.  Will UF as tolerated and continue with midodrine before HD.  Anemia  - Will check iron stores (not sent yet);  ESA as needed.  Metabolic bone disease -  continue with home meds, phos 10.4. Not on a binder as outpt yet -> will start on renvela 2 tabs TIDM  Nutrition - renal diet carb, modified  ANCA vasculitis - s/p rituximab in April.  Per his wife, the biopsy revealed "a lot of scarring" and don't believe he will regain kidney function.  Continue with prednisone taper and PCP  prophylaxis with Bactrim.  Atrial fibrillation with RVR - not on anticoagulation due to recent GI bleed.  Cardiology consulted for rate control prior to surgery.  Chronic combined systolic and diastolic CHF - has edema but UF limited by hypotension and A fib RVR.  Will UF as tolerated with HD and continue to use midodrine for BP support.  Hyperkalemia - dosed [place]el[place] and follow; HD today.   Subjective:   Hungry, but otherwise doing well. Denies dyspnea, fever, chills, nausea Main complaints are the condom cath coming off, leaking and back pain from shingles.   Objective:   BP 104/80 (BP Location: Left Arm)   Pulse (!) 110   Temp 97.8 F (36.6 C) (Oral)   Resp 15   Ht 5\' 11"  (1.803 m)   Wt 97.6 kg   SpO2 97%   BMI 30.01 kg/m   Intake/Output Summary (Last 24 hours) at 07/08/2021 9381 Last data filed at 07/08/2021 0600 Gross per 24 hour  Intake 120 ml  Output 100 ml  Net 20 ml   Weight change:   Physical Exam: General appearance: NAD Head: NCAT Eyes: negative scleral icterus Resp: clear to auscultation bilaterally Cardio: irregularly irregular rhythm  GI: SNDNT + BS Extremities: edema 1-2+ pretibial edema bilaterally  Access: RIJ TC  Imaging: No results found.  Labs:  BMET Recent Labs  Lab 07/03/21 1620 07/04/21 0553 07/04/21 2219 07/05/21 0317 07/06/21 0120 07/07/21 0137 07/08/21 0115  NA 138 138 135 136 135 135 135  K 4.7 5.1 5.2* 5.0 5.9* 4.4 4.6  CL 97* 98 96* 99 96* 95* 97*  CO2 24 25 23 22  19* 24 22  GLUCOSE 137* 117* 157* 109* 158* 122* 151*  BUN 74* 80* 92* 94* 110* 76* 89*  CREATININE 4.67* 5.36* 6.05* 6.16* 6.82* 5.39* 5.92*  CALCIUM 8.1* 8.2* 8.3* 8.0* 7.9* 7.6* 7.9*  PHOS  --  7.1*  --  9.0* 10.4* 7.1* 7.8*   CBC Recent Labs  Lab 07/03/21 1620 07/04/21 0553 07/05/21 0317 07/06/21 0120 07/07/21 0137 07/08/21 0115  WBC 7.3   < > 5.5 6.8 5.8 4.8  NEUTROABS 5.3  --   --   --   --   --   HGB 8.8*   < > 8.1* 9.7* 8.9* 9.0*  HCT 27.3*   < >  24.8* 28.8* 27.1* 27.0*  MCV 102.6*   < > 101.2* 97.6 98.2 98.2  PLT 145*   < > 134* 132* 129* 129*   < > = values in this interval not displayed.    Medications:     acetaminophen  650 mg Oral Q6H   Or   acetaminophen  650 mg Rectal Q6H   amiodarone  400 mg Oral BID   Followed by   Derrill Memo ON 07/14/2021] amiodarone  200 mg Oral Daily   atorvastatin  80 mg Oral Daily   calcium-vitamin D  1 tablet Oral Q breakfast   cefadroxil  500 mg Oral Daily   Chlorhexidine Gluconate Cloth  6 each Topical Daily   Chlorhexidine Gluconate Cloth  6 each Topical Q0600   metoprolol tartrate  37.5 mg Oral BID   midodrine  10 mg Oral Q T,Th,Sa-HD   omega-3 acid ethyl esters  1 g Oral BID   pantoprazole  40 mg Oral BID   predniSONE  20 mg Oral Q breakfast   QUEtiapine  100 mg Oral QHS   sevelamer carbonate  1,600 mg Oral TID WC   sulfamethoxazole-trimethoprim  1 tablet Oral Once per day on Mon Wed Fri   vitamin C  125 mg Oral Daily      Otelia Santee, MD 07/08/2021, 8:24 AM

## 2021-07-08 NOTE — TOC Progression Note (Addendum)
Transition of Care [facility]) - Progression Note    Patient Details  Name: Blake West MRN: [id number] Date of Birth: 04-23-1950  Transition of Care [facility]) CM/SW Contact  Joanne Chars, LCSW Phone Number: 07/08/2021, 10:22 AM  Clinical Narrative:   Bed offers presented to pt wife.  She chose [facility], who then said they did not have available beds.  [facility] also made offer but wife cannot drive that far.  Discussed with wife and she would like to pursue [facility] admit in [place], referral sent.    1030: [facility] makes bed offer, spoke to wife and she accepts.  [facility] can transport to current HD site in [place].    Auth request made to Tammy at [facility].   Expected Discharge Plan: [place] Barriers to Discharge: Continued Medical Work up, SNF Pending bed offer  Expected Discharge Plan and Services Expected Discharge Plan: [place] In-house Referral: Clinical Social Work Discharge Planning Services: CM Consult Post Acute Care Choice: [place] arrangements for the past 2 months: [place] Determinants of Health (SDOH) Interventions    Readmission Risk Interventions    05/12/2021   12:23 PM 04/22/2021   12:24 PM  Readmission Risk Prevention Plan  Transportation Screening Complete Complete  PCP or Specialist Appt within 3-5 Days  Complete  Social Work Consult for [place] Planning/Counseling  Complete  Palliative Care Screening  Not Applicable  Medication Review Press photographer) Complete Complete  PCP or Specialist appointment within 3-5 days of discharge Complete   HRI or [place] Complete   SW Recovery Care/Counseling Consult Complete   Palliative Care Screening Complete   [place] Not Applicable

## 2021-07-08 NOTE — Progress Notes (Signed)
Daily Progress Note Intern Pager: [phone]  Patient name: Blake West Medical record number: [id number] Date of birth: 02/13/1950 Age: 71 y.o. Gender: male  Primary Care Provider: Leonel Ramsay, MD Consultants: Cardiology, nephrology, orthopedics, chaplain Code Status: DNR  Pt Overview and Major Events to Date:  6/10: admitted 6/12: right hip hemiarthroplasty 6/14: changed to DNR  Assessment and Plan: Blake West is a 71 y.o. male who presented with right femoral neck fracture now status post right hip hemiarthroplasty and stable for discharge to SNF. Pertinent PMH/PSH includes ESRD on HD, ANCA associated vasculitis, CAD, HFmrEF, NICM, chronic atrial fibrillation with RVR, HTN, thrombocytopenia, diet controlled T2DM, OSA, hypothyroidism. * Displaced fracture of right femoral neck (HCC) Now s/p R hip hemiarthroplasty. Pain adequately controlled. Has minimally required PRN pain medications. Stable for SNF placement. -Ortho following, appreciate recommendations -PT/OT eval and treat Cefadroxil 500mg  BID x7 days, per orthopedics, today is day 3/7 -Pain control with Tylenol 650 mg q6h, Oxy 2.5 mg q4h PRN, Fentanyl 50 mcg q2h for breakthrough pain -Delirium precautions (hx of this on prior admissions) -Continue Os-cal, Vitamin D supplementation, added 1000u Vit D -Consider starting bisphosphate prior to d/c  End stage renal disease on dialysis ([place]) Stable.  HD on TTS. -Nephrology following -RFP on HD days only -Avoid nephrotoxic agents -Midodrine 3 times weekly with HD   ANCA-associated vasculitis (HCC) Currently on Prednisone daily and Bactrim 3 times weekly for PCP ppx.  -continue prednisone taper to start 20mg  daily today -Continue Bactrim MWF  Chronic atrial fibrillation with RVR (HCC) Generally remains in A-fib with RVR, asymptomatic. HR continues to be around 110s. Patient has declined aggressive rate control in the past.  Not on anticoagulation due to history  of GI bleed.  Cardiology continues to work towards improving A-fib. -Cardiology following, appreciate recommendations -tele monitoring -Increase metoprolol tartrate to 37.5 mg twice daily -Amiodarone 400mg  BID x 7 days then followed by 200mg  daily -hold anticoagulation   Heart failure with mid-range ejection fraction (HCC) Recent TTE showing EF 40 to 45%.  Appears mildly volume overloaded on exam with 1+ pitting edema, improved.  - TED hose to mobilize fluid  Macrocytic anemia Hgb stable at 9.0. Given stability for several days, will not continue to monitor via labs. -Monitor for signs/symptoms of bleeding. -Continue Protonix 40 mg BID for chronic ulcers -Continue B vitamin supplementation  FEN/GI: Carb modified PPx: SCDs, anticoagulation contraindicated given anemia 2/2 chronic ulcers Dispo:SNF. Barriers include placement.   Subjective:  States he is doing well this morning but he is not looking forward to dialysis.  Objective: Temp:  [97.6 F (36.4 C)-98.5 F (36.9 C)] 97.6 F (36.4 C) (06/15 1305) Pulse Rate:  [82-132] 115 (06/15 1305) Resp:  [15-23] 17 (06/15 1200) BP: (94-148)/(71-115) 108/79 (06/15 1305) SpO2:  [90 %-97 %] 94 % (06/15 1111) Weight:  [96.4 kg-98.5 kg] 96.4 kg (06/15 1111) Physical Exam: General: Awake, alert, NAD in dialysis unit Cardiovascular: Irregularly irregular, tachycardic, no murmurs auscultated Respiratory: CTA B, [place] in place on 4 L Extremities: +1 pitting edema of BLEs  Laboratory: Most recent CBC Lab Results  Component Value Date   WBC 4.8 07/08/2021   HGB 9.0 (L) 07/08/2021   HCT 27.0 (L) 07/08/2021   MCV 98.2 07/08/2021   PLT 129 (L) 07/08/2021   Most recent BMP    Latest Ref Rng & Units 07/08/2021    1:15 AM  BMP  Glucose 70 - 99 mg/dL 151   BUN 8 -  23 mg/dL 89   Creatinine 0.61 - 1.24 mg/dL 5.92   Sodium 135 - 145 mmol/L 135   Potassium 3.5 - 5.1 mmol/L 4.6   Chloride 98 - 111 mmol/L 97   CO2 22 - 32 mmol/L 22   Calcium  8.9 - 10.3 mg/dL 7.9    Imaging/Diagnostic Tests: No new imaging results. Wells Guiles, DO 07/08/2021, 1:43 PM  PGY-1, [place] Intern pager: [phone], text pages welcome Secure chat group [place]

## 2021-07-08 NOTE — Progress Notes (Signed)
HD treatment completed early due to clotting of system, 1 hour left pt refused to restart system, Dr. Augustin Coupe made aware, pt remain alert and oriented, 2L removed post treatment via HD catheter

## 2021-07-09 DIAGNOSIS — Z471 Aftercare following joint replacement surgery: Secondary | ICD-10-CM | POA: Diagnosis not present

## 2021-07-09 DIAGNOSIS — M6281 Muscle weakness (generalized): Secondary | ICD-10-CM | POA: Diagnosis not present

## 2021-07-09 DIAGNOSIS — N179 Acute kidney failure, unspecified: Secondary | ICD-10-CM | POA: Diagnosis not present

## 2021-07-09 DIAGNOSIS — E1122 Type 2 diabetes mellitus with diabetic chronic kidney disease: Secondary | ICD-10-CM | POA: Diagnosis not present

## 2021-07-09 DIAGNOSIS — G4733 Obstructive sleep apnea (adult) (pediatric): Secondary | ICD-10-CM | POA: Diagnosis not present

## 2021-07-09 DIAGNOSIS — M6259 Muscle wasting and atrophy, not elsewhere classified, multiple sites: Secondary | ICD-10-CM | POA: Diagnosis not present

## 2021-07-09 DIAGNOSIS — E875 Hyperkalemia: Secondary | ICD-10-CM | POA: Diagnosis not present

## 2021-07-09 DIAGNOSIS — R2689 Other abnormalities of gait and mobility: Secondary | ICD-10-CM | POA: Diagnosis not present

## 2021-07-09 DIAGNOSIS — S72041K Displaced fracture of base of neck of right femur, subsequent encounter for closed fracture with nonunion: Secondary | ICD-10-CM | POA: Diagnosis not present

## 2021-07-09 DIAGNOSIS — I132 Hypertensive heart and chronic kidney disease with heart failure and with stage 5 chronic kidney disease, or end stage renal disease: Secondary | ICD-10-CM | POA: Diagnosis not present

## 2021-07-09 DIAGNOSIS — S72001D Fracture of unspecified part of neck of right femur, subsequent encounter for closed fracture with routine healing: Secondary | ICD-10-CM | POA: Diagnosis not present

## 2021-07-09 DIAGNOSIS — R1312 Dysphagia, oropharyngeal phase: Secondary | ICD-10-CM | POA: Diagnosis not present

## 2021-07-09 DIAGNOSIS — R41841 Cognitive communication deficit: Secondary | ICD-10-CM | POA: Diagnosis not present

## 2021-07-09 DIAGNOSIS — D649 Anemia, unspecified: Secondary | ICD-10-CM | POA: Diagnosis not present

## 2021-07-09 DIAGNOSIS — Y841 Kidney dialysis as the cause of abnormal reaction of the patient, or of later complication, without mention of misadventure at the time of the procedure: Secondary | ICD-10-CM | POA: Diagnosis not present

## 2021-07-09 DIAGNOSIS — N186 End stage renal disease: Secondary | ICD-10-CM | POA: Diagnosis not present

## 2021-07-09 DIAGNOSIS — N250 Renal osteodystrophy: Secondary | ICD-10-CM | POA: Diagnosis not present

## 2021-07-09 DIAGNOSIS — E038 Other specified hypothyroidism: Secondary | ICD-10-CM | POA: Diagnosis not present

## 2021-07-09 DIAGNOSIS — I4891 Unspecified atrial fibrillation: Secondary | ICD-10-CM | POA: Diagnosis not present

## 2021-07-09 DIAGNOSIS — S72001A Fracture of unspecified part of neck of right femur, initial encounter for closed fracture: Secondary | ICD-10-CM | POA: Diagnosis not present

## 2021-07-09 DIAGNOSIS — E1159 Type 2 diabetes mellitus with other circulatory complications: Secondary | ICD-10-CM | POA: Diagnosis not present

## 2021-07-09 DIAGNOSIS — I5022 Chronic systolic (congestive) heart failure: Secondary | ICD-10-CM | POA: Diagnosis not present

## 2021-07-09 DIAGNOSIS — R531 Weakness: Secondary | ICD-10-CM | POA: Diagnosis not present

## 2021-07-09 DIAGNOSIS — E785 Hyperlipidemia, unspecified: Secondary | ICD-10-CM | POA: Diagnosis not present

## 2021-07-09 DIAGNOSIS — Z743 Need for continuous supervision: Secondary | ICD-10-CM | POA: Diagnosis not present

## 2021-07-09 DIAGNOSIS — D631 Anemia in chronic kidney disease: Secondary | ICD-10-CM | POA: Diagnosis not present

## 2021-07-09 DIAGNOSIS — I7782 Antineutrophilic cytoplasmic antibody (ANCA) vasculitis: Secondary | ICD-10-CM | POA: Diagnosis not present

## 2021-07-09 DIAGNOSIS — S79929A Unspecified injury of unspecified thigh, initial encounter: Secondary | ICD-10-CM | POA: Diagnosis not present

## 2021-07-09 DIAGNOSIS — Z9181 History of falling: Secondary | ICD-10-CM | POA: Diagnosis not present

## 2021-07-09 DIAGNOSIS — D751 Secondary polycythemia: Secondary | ICD-10-CM | POA: Diagnosis not present

## 2021-07-09 DIAGNOSIS — I5042 Chronic combined systolic (congestive) and diastolic (congestive) heart failure: Secondary | ICD-10-CM | POA: Diagnosis not present

## 2021-07-09 DIAGNOSIS — T82898A Other specified complication of vascular prosthetic devices, implants and grafts, initial encounter: Secondary | ICD-10-CM | POA: Diagnosis not present

## 2021-07-09 DIAGNOSIS — I2510 Atherosclerotic heart disease of native coronary artery without angina pectoris: Secondary | ICD-10-CM | POA: Diagnosis not present

## 2021-07-09 DIAGNOSIS — Z79899 Other long term (current) drug therapy: Secondary | ICD-10-CM | POA: Diagnosis not present

## 2021-07-09 DIAGNOSIS — Z992 Dependence on renal dialysis: Secondary | ICD-10-CM | POA: Diagnosis not present

## 2021-07-09 DIAGNOSIS — E8779 Other fluid overload: Secondary | ICD-10-CM | POA: Diagnosis not present

## 2021-07-09 DIAGNOSIS — J189 Pneumonia, unspecified organism: Secondary | ICD-10-CM | POA: Diagnosis not present

## 2021-07-09 DIAGNOSIS — Z96641 Presence of right artificial hip joint: Secondary | ICD-10-CM | POA: Diagnosis not present

## 2021-07-09 DIAGNOSIS — M84757A Complete oblique atypical femoral fracture, right leg, initial encounter for fracture: Secondary | ICD-10-CM | POA: Diagnosis not present

## 2021-07-09 DIAGNOSIS — R262 Difficulty in walking, not elsewhere classified: Secondary | ICD-10-CM | POA: Diagnosis not present

## 2021-07-09 DIAGNOSIS — M898X9 Other specified disorders of bone, unspecified site: Secondary | ICD-10-CM | POA: Diagnosis not present

## 2021-07-09 DIAGNOSIS — T8249XA Other complication of vascular dialysis catheter, initial encounter: Secondary | ICD-10-CM | POA: Diagnosis not present

## 2021-07-09 LAB — FERRITIN: Ferritin: 1354 ng/mL — ABNORMAL HIGH (ref 24–336)

## 2021-07-09 LAB — IRON AND TIBC
Iron: 12 ug/dL — ABNORMAL LOW (ref 45–182)
Saturation Ratios: 5 % — ABNORMAL LOW (ref 17.9–39.5)
TIBC: 249 ug/dL — ABNORMAL LOW (ref 250–450)
UIBC: 237 ug/dL

## 2021-07-09 LAB — CBC
HCT: 28.9 % — ABNORMAL LOW (ref 39.0–52.0)
Hemoglobin: 9.4 g/dL — ABNORMAL LOW (ref 13.0–17.0)
MCH: 32.6 pg (ref 26.0–34.0)
MCHC: 32.5 g/dL (ref 30.0–36.0)
MCV: 100.3 fL — ABNORMAL HIGH (ref 80.0–100.0)
Platelets: 139 10*3/uL — ABNORMAL LOW (ref 150–400)
RBC: 2.88 MIL/uL — ABNORMAL LOW (ref 4.22–5.81)
RDW: 17.9 % — ABNORMAL HIGH (ref 11.5–15.5)
WBC: 5.5 10*3/uL (ref 4.0–10.5)
nRBC: 3.5 % — ABNORMAL HIGH (ref 0.0–0.2)

## 2021-07-09 LAB — RENAL FUNCTION PANEL
Albumin: 2.5 g/dL — ABNORMAL LOW (ref 3.5–5.0)
Anion gap: 15 (ref 5–15)
BUN: 72 mg/dL — ABNORMAL HIGH (ref 8–23)
CO2: 24 mmol/L (ref 22–32)
Calcium: 8.1 mg/dL — ABNORMAL LOW (ref 8.9–10.3)
Chloride: 99 mmol/L (ref 98–111)
Creatinine, Ser: 5.55 mg/dL — ABNORMAL HIGH (ref 0.61–1.24)
GFR, Estimated: 10 mL/min — ABNORMAL LOW (ref >60)
Glucose, Bld: 97 mg/dL (ref 70–99)
Phosphorus: 5.3 mg/dL — ABNORMAL HIGH (ref 2.5–4.6)
Potassium: 4.7 mmol/L (ref 3.5–5.1)
Sodium: 138 mmol/L (ref 135–145)

## 2021-07-09 MED ORDER — SEVELAMER CARBONATE 800 MG PO TABS: 1600.0000 mg | ORAL_TABLET | Freq: Three times a day (TID) | ORAL | Status: AC

## 2021-07-09 MED ORDER — METOPROLOL TARTRATE 37.5 MG PO TABS: 37.5000 mg | ORAL_TABLET | Freq: Two times a day (BID) | ORAL | Status: AC

## 2021-07-09 MED ORDER — CEFADROXIL 500 MG PO CAPS: 500.0000 mg | ORAL_CAPSULE | Freq: Every day | ORAL | 0 refills | Status: AC

## 2021-07-09 MED ORDER — CHOLECALCIFEROL 25 MCG (1000 UT) PO TABS: 1000.0000 [IU] | ORAL_TABLET | Freq: Every day | ORAL | Status: AC

## 2021-07-09 MED ORDER — HEPARIN SODIUM (PORCINE) 1000 UNIT/ML DIALYSIS: 2000.0000 [IU] | Freq: Once | INTRAMUSCULAR | Status: DC

## 2021-07-09 MED ORDER — PREDNISONE 20 MG PO TABS: 20.0000 mg | ORAL_TABLET | Freq: Every day | ORAL | 0 refills | Status: AC

## 2021-07-09 MED ORDER — AMIODARONE HCL 200 MG PO TABS
ORAL_TABLET | ORAL | 0 refills | Status: AC
Start: 2021-07-09 — End: 2021-08-08

## 2021-07-09 MED ORDER — POLYETHYLENE GLYCOL 3350 17 G PO PACK: 17.0000 g | PACK | Freq: Every day | ORAL | 0 refills | Status: AC | PRN

## 2021-07-09 MED ORDER — ATORVASTATIN CALCIUM 80 MG PO TABS: 80.0000 mg | ORAL_TABLET | Freq: Every day | ORAL | Status: DC

## 2021-07-09 NOTE — Progress Notes (Signed)
Pt to d/c to snf today. Advised by CSW that snf is willing to transport pt to [facility] TTS. [place] and spoke to [place], Agricultural consultant. Hinton Dyer advised pt will d/c to snf today and resume care tomorrow. Provided snf name to HD clinic. D/C summary and last renal note faxed to clinic for continuation of care (fax# [phone]).   Melven Sartorius Renal Navigator [phone]

## 2021-07-09 NOTE — TOC Transition Note (Signed)
Transition of Care [facility]) - CM/SW Discharge Note   Patient Details  Name: Blake West MRN: [id number] Date of Birth: May 19, 1950  Transition of Care [facility]) CM/SW Contact:  Joanne Chars, LCSW Phone Number: 07/09/2021, 1:04 PM   Clinical Narrative:   Pt discharging to [facility].  RN call [phone] for report.     Final next level of care: Skilled Nursing Facility Barriers to Discharge: Barriers Resolved   Patient Goals and CMS Choice Patient states their goals for this hospitalization and ongoing recovery are:: "be independent" CMS Medicare.gov Compare Post Acute Care list provided to:: Patient Represenative (must comment) Choice offered to / list presented to : Spouse  Discharge Placement              Patient chooses bed at:  [facility]) Patient to be transferred to facility by: [place] Name of family member notified: wife Susie Patient and family notified of of transfer: 07/09/21  Discharge Plan and Services In-house Referral: Clinical Social Work Discharge Planning Services: CM Consult Post Acute Care Choice: [place]                               Social Determinants of Health (SDOH) Interventions     Readmission Risk Interventions    05/12/2021   12:23 PM 04/22/2021   12:24 PM  Readmission Risk Prevention Plan  Transportation Screening Complete Complete  PCP or Specialist Appt within 3-5 Days  Complete  Social Work Consult for [place] Planning/Counseling  Complete  Palliative Care Screening  Not Applicable  Medication Review Press photographer) Complete Complete  PCP or Specialist appointment within 3-5 days of discharge Complete   HRI or [place] Complete   SW Recovery Care/Counseling Consult Complete   Palliative Care Screening Complete   [place] Not Applicable

## 2021-07-09 NOTE — Progress Notes (Signed)
   07/09/21 1208  Clinical Encounter Type  Visited With Patient and family together  Visit Type Follow-up  Referral From Nurse  Consult/Referral To Chaplain Melvenia Beam)   Responded to page for Advance Directive. Previously provided A.D. Education to patient and wife. Requesting Notarization of HCPOA and Living Will. Explained that [place] not available today.  [address] [place], Ivin Poot., [phone]

## 2021-07-09 NOTE — Care Management Important Message (Signed)
Important Message  Patient Details  Name: Blake West MRN: [id number] Date of Birth: 04/02/1950   Medicare Important Message Given:  Yes     [name] Montine Circle 07/09/2021, 2:44 PM

## 2021-07-09 NOTE — Progress Notes (Signed)
FPTS Brief Progress Note  S:Went bedside to see patient. Patient sleeping, did not disturb   O: BP 118/82   Pulse (!) 107   Temp 98.1 F (36.7 C) (Oral)   Resp 19   Ht 5\' 11"  (1.803 m)   Wt 96.4 kg   SpO2 94%   BMI 29.64 kg/m     A/P: - Plans per day team - Orders reviewed. Labs for AM ordered, which was adjusted as needed.   Holley Bouche, MD 07/09/2021, 3:47 AM PGY-1, [facility] Night Resident  Please page [phone] with questions.

## 2021-07-09 NOTE — Progress Notes (Signed)
[facility] Progress Note   71 y.o. male CAD, h/o GIB, chronic combined diastolic and systolic CHF, HTN, DM type 2, HLD, atrial fibrillation with RVR (not on coagulation due to recent GIB on 05/11/21), and HD-dependent AKI due to ANCA vasculitis and recent start on HD in April 2023 who presented to [facility] ED on 07/03/21 after falling at home.  Xray noted right displaced femoral neck fracture and was admitted for surgical intervention.    His wife reports that he has been having a tough time with HD due to hypotension and inability to sit in recliner for 4 hours due to discomfort and agitation.  He requires midodrine prior to HD.  Dialysis Orders: Center: [facility]  on TTS  on [address] 3hr 35, EDW 93kg  Nipro  400/500 HCO3 39 T37 2/2.5 bath RIJ TC Mircera 50 q2weeks last given 6/10  Assessment/ Plan:    Displaced right femoral neck fracture - Ortho right hip arthroplasty on 07/05/21.  ESRD -  Will plan to continue with TTS schedule.   Tolerated HD 6/13 w/ 1.4L net UF, 6/15 2.1L  No absolute indication for RRT and the patient appears to be  comfortable; TED hose to mobilize fluid. He's still up on his EDW; he has shingles left lower back and also with right hip arthroplasty. Refusing more than 3hr treatment.  Continue HD TTS regimen; HD tomorrow.   Hypertension/volume  - has 1+ edema but tachy and low BP.  Will UF as tolerated tomorrow and continue with midodrine before HD.  Anemia  - Will check iron stores (not sent yet) -> ordered;  ESA as needed. Mircera 50 q2weeks last given 6/10.  Metabolic bone disease -  continue with home meds, phos 10.4. Not on a binder as outpt yet -> start on renvela 2 tabs TIDM -> down from 10.4 to 5.3   Nutrition - renal diet carb, modified  ANCA vasculitis - s/p rituximab in April.  Per his wife, the biopsy revealed "a lot of scarring" and don't believe he will regain kidney function.  Continue with prednisone taper and PCP prophylaxis with  Bactrim.  Atrial fibrillation with RVR - not on anticoagulation due to recent GI bleed.  Cardiology consulted for rate control prior to surgery.  Chronic combined systolic and diastolic CHF - has edema but UF limited by hypotension and A fib RVR.  Will UF as tolerated with HD and continue to use midodrine for BP  Hyperkalemia - better.   Subjective:   Hungry, but otherwise doing well. Denies dyspnea, fever, chills, nausea Main complaints are the condom cath coming off, leaking and back pain from shingles. Tachy afib RVR seen by cards   Objective:   BP 113/79 (BP Location: Right Arm)   Pulse 77   Temp 98.7 F (37.1 C) (Oral)   Resp 18   Ht 5\' 11"  (1.803 m)   Wt 96.4 kg   SpO2 92%   BMI 29.64 kg/m   Intake/Output Summary (Last 24 hours) at 07/09/2021 0949 Last data filed at 07/08/2021 1130 Gross per 24 hour  Intake --  Output 2100 ml  Net -2100 ml   Weight change:   Physical Exam: General appearance: NAD Head: NCAT Eyes: negative scleral icterus Resp: clear to auscultation bilaterally Cardio: irregularly irregular rhythm  GI: SNDNT + BS Extremities: edema 1-2+ pretibial edema bilaterally  Access: RIJ TC Back: left lower flank with shingles, healing  Imaging: No results found.  Labs: [employer]  07/04/21 0553 07/04/21 2219 07/05/21 0317 07/06/21 0120 07/07/21 0137 07/08/21 0115 07/09/21 0607  NA 138 135 136 135 135 135 138  K 5.1 5.2* 5.0 5.9* 4.4 4.6 4.7  CL 98 96* 99 96* 95* 97* 99  CO2 25 23 22  19* 24 22 24   GLUCOSE 117* 157* 109* 158* 122* 151* 97  BUN 80* 92* 94* 110* 76* 89* 72*  CREATININE 5.36* 6.05* 6.16* 6.82* 5.39* 5.92* 5.55*  CALCIUM 8.2* 8.3* 8.0* 7.9* 7.6* 7.9* 8.1*  PHOS 7.1*  --  9.0* 10.4* 7.1* 7.8* 5.3*   CBC Recent Labs  Lab 07/03/21 1620 07/04/21 0553 07/06/21 0120 07/07/21 0137 07/08/21 0115 07/09/21 0607  WBC 7.3   < > 6.8 5.8 4.8 5.5  NEUTROABS 5.3  --   --   --   --   --   HGB 8.8*   < > 9.7* 8.9* 9.0* 9.4*   HCT 27.3*   < > 28.8* 27.1* 27.0* 28.9*  MCV 102.6*   < > 97.6 98.2 98.2 100.3*  PLT 145*   < > 132* 129* 129* 139*   < > = values in this interval not displayed.    Medications:     acetaminophen  650 mg Oral Q6H   Or   acetaminophen  650 mg Rectal Q6H   amiodarone  400 mg Oral BID   Followed by   Derrill Memo ON 07/14/2021] amiodarone  200 mg Oral Daily   atorvastatin  80 mg Oral Daily   calcium-vitamin D  1 tablet Oral Q breakfast   cefadroxil  500 mg Oral Daily   Chlorhexidine Gluconate Cloth  6 each Topical Q0600   metoprolol tartrate  37.5 mg Oral BID   midodrine  10 mg Oral Q T,Th,Sa-HD   omega-3 acid ethyl esters  1 g Oral BID   pantoprazole  40 mg Oral BID   predniSONE  20 mg Oral Q breakfast   QUEtiapine  100 mg Oral QHS   sevelamer carbonate  1,600 mg Oral TID WC   sulfamethoxazole-trimethoprim  1 tablet Oral Once per day on Mon Wed Fri   vitamin C  125 mg Oral Daily      Otelia Santee, MD 07/09/2021, 9:49 AM

## 2021-07-09 NOTE — Progress Notes (Signed)
   Progress Note  Patient Name: Blake West Date of Encounter: 07/09/2021  [facility] Cardiologist: Nelva Bush, MD   Telemetry reviewed.  Patient's heart rate remains elevated.  His blood pressure has been borderline.  We will continue present dose of metoprolol and amiodarone is being loaded.  Continue to follow heart rate.  No anticoagulation given history of GI bleed. We are following.  For questions or updates, please contact [place] Please consult [email or web address] for contact info under        Signed, Kirk Ruths, MD  07/09/2021, 8:03 AM

## 2021-07-09 NOTE — TOC Progression Note (Signed)
Transition of Care [facility]) - Progression Note    Patient Details  Name: Blake West MRN: [id number] Date of Birth: 03-Jun-1950  Transition of Care [facility]) CM/SW Contact  Joanne Chars, LCSW Phone Number: 07/09/2021, 12:00 PM  Clinical Narrative:    Josem Kaufmann received from [facility]: SNF: [id number], PTAR: [id number].  MD informed.     Expected Discharge Plan: [place] Barriers to Discharge: Continued Medical Work up, SNF Pending bed offer  Expected Discharge Plan and Services Expected Discharge Plan: [place] In-house Referral: Clinical Social Work Discharge Planning Services: CM Consult Post Acute Care Choice: [place] arrangements for the past 2 months: [place] Determinants of Health (SDOH) Interventions    Readmission Risk Interventions    05/12/2021   12:23 PM 04/22/2021   12:24 PM  Readmission Risk Prevention Plan  Transportation Screening Complete Complete  PCP or Specialist Appt within 3-5 Days  Complete  Social Work Consult for [place] Planning/Counseling  Complete  Palliative Care Screening  Not Applicable  Medication Review Press photographer) Complete Complete  PCP or Specialist appointment within 3-5 days of discharge Complete   HRI or [place] Complete   SW Recovery Care/Counseling Consult Complete   Palliative Care Screening Complete   [place] Not Applicable

## 2021-07-09 NOTE — Progress Notes (Signed)
Physical Therapy Treatment Patient Details Name: Blake West MRN: [id number] DOB: 09/15/1950 Today's Date: 07/09/2021   History of Present Illness Pt is a 71 y.o. male admitted 07/03/2021 after fall sustaining R femoral neck fx. S/p R hip hemiarthroplasty 6/12. PMH includes ESRD (HD TTS), ANCA associated vasculitis, CAD, HFrEF, NICM, chronic atrial fibrillation with RVR, HTN, thrombocytopenia, DM2.    PT Comments    Pt admitted with above diagnosis. Pt able to take some steps today but still fatigues quickly. Limited by DOE and fatigue as well as ? Self limiting. Will continue to progress pt as able.  Pt currently with functional limitations due to balance and endurance deficits. Pt will benefit from skilled PT to increase their independence and safety with mobility to allow discharge to the venue listed below.      Recommendations for follow up therapy are one component of a multi-disciplinary discharge planning process, led by the attending physician.  Recommendations may be updated based on patient status, additional functional criteria and insurance authorization.  Follow Up Recommendations  Skilled nursing-short term rehab (<3 hours/day)     Assistance Recommended at Discharge Frequent or constant Supervision/Assistance  Patient can return home with the following A lot of help with walking and/or transfers;A lot of help with bathing/dressing/bathroom;Assistance with cooking/housework;Assist for transportation;Help with stairs or ramp for entrance   Equipment Recommendations  Rolling walker (2 wheels);BSC/3in1;Hospital bed    Recommendations for Other Services       Precautions / Restrictions Precautions Precautions: Fall;Other (comment);Posterior Hip Precaution Booklet Issued: Yes (comment) Precaution Comments: Messaged MD  who clarified that pt has posterior hip precautions.  Watch HR and SpO2 (does not wear O2 baseline) Restrictions Weight Bearing Restrictions: Yes RLE Weight  Bearing: Weight bearing as tolerated     Mobility  Bed Mobility Overal bed mobility: Needs Assistance Bed Mobility: Supine to Sit, Sit to Supine     Supine to sit: HOB elevated, Min assist     General bed mobility comments: increased time and effort, min A for scooting hips to EOB and pt using rail to sit up.    Transfers Overall transfer level: Needs assistance Equipment used: Rolling walker (2 wheels) Transfers: Sit to/from Stand, Bed to chair/wheelchair/BSC Sit to Stand: Mod assist, +2 physical assistance, +2 safety/equipment, From elevated surface           General transfer comment: modA for power up and steadying once up.  Pt flexes and needs mod cues to stand taller and grip RW correctly.    Ambulation/Gait Ambulation/Gait assistance: Mod assist, +2 safety/equipment, Min assist Gait Distance (Feet): 6 Feet (3 feet and 3 feet) Assistive device: Rolling walker (2 wheels) Gait Pattern/deviations: Step-to pattern, Decreased step length - right, Decreased step length - left, Decreased stance time - right, Decreased weight shift to right, Knee hyperextension - left, Trunk flexed, Leaning posteriorly   Gait velocity interpretation: <1.31 ft/sec, indicative of household ambulator   General Gait Details: Pt able to take a few steps today. Pt needed cues to advance RW as well as assist. Pt needs cues to sequence steps. Pt noted tohave difficulty weighht shifting onto right LE due to pain. When pt fatigues and DOE 4/4, he leans onto RW and cannot continue and needed chair brought to him.  Pt on 3LO2.  Pt cannot state why he is SOB and if pain is causing him to stop. Pt with history of bow legs and needs constant cues to not turn toes in.   Stairs  Wheelchair Mobility    Modified Rankin (Stroke Patients Only)       Balance Overall balance assessment: Needs assistance Sitting-balance support: Feet supported, No upper extremity supported Sitting  balance-Leahy Scale: Fair Sitting balance - Comments: prolonged sitting EOB without UE support, preference for at least single UE support   Standing balance support: Bilateral upper extremity supported, During functional activity, Reliant on assistive device for balance Standing balance-Leahy Scale: Poor Standing balance comment: relies on UE support for balance as well as external support                            Cognition Arousal/Alertness: Awake/alert Behavior During Therapy: Anxious Overall Cognitive Status: Impaired/Different from baseline Area of Impairment: Attention, Memory, Following commands, Safety/judgement, Awareness, Problem solving                   Current Attention Level: Sustained, Selective Memory: Decreased short-term memory, Decreased recall of precautions Following Commands: Follows one step commands with increased time Safety/Judgement: Decreased awareness of deficits Awareness: Emergent Problem Solving: Requires verbal cues General Comments: difficulty reasoning with pt regarding importance of OOB mobility (wife reports, "he's stubborn); pt with significant SOB, not giving reason why, wife reporting pt has had incr SOB PTA for months; appears anxious regarding mobility, pt will not confirm this, giving minimal responses to questions        Exercises General Exercises - Lower Extremity Ankle Circles/Pumps: Both, 10 reps, Supine Quad Sets: Both, 10 reps, Supine Long Arc Quad: AROM, Both, 10 reps, Seated Heel Slides: Both, AROM, AAROM, 10 reps, Supine, Other (comment) (RLE to ~20 deg) Hip ABduction/ADduction: AROM, Both, 10 reps, Supine    General Comments General comments (skin integrity, edema, etc.): HR 120-130 bpm with activity.  SpO2 >90% on 3LO2      Pertinent Vitals/Pain Pain Assessment Pain Assessment: Faces Faces Pain Scale: Hurts little more Breathing: normal Negative Vocalization: none Facial Expression: smiling or  inexpressive Body Language: relaxed Pain Location: R hip Pain Descriptors / Indicators: Grimacing, Sore Pain Intervention(s): Limited activity within patient's tolerance, Monitored during session, Repositioned    Home Living                          Prior Function            PT Goals (current goals can now be found in the care plan section) Acute Rehab PT Goals Patient Stated Goal: less pain Progress towards PT goals: Progressing toward goals    Frequency    Min 3X/week      PT Plan Current plan remains appropriate    Co-evaluation              AM-PAC PT "6 Clicks" Mobility   Outcome Measure  Help needed turning from your back to your side while in a flat bed without using bedrails?: A Little Help needed moving from lying on your back to sitting on the side of a flat bed without using bedrails?: A Lot Help needed moving to and from a bed to a chair (including a wheelchair)?: A Lot Help needed standing up from a chair using your arms (e.g., wheelchair or bedside chair)?: A Lot Help needed to walk in hospital room?: Total Help needed climbing 3-5 steps with a railing? : Total 6 Click Score: 11    End of Session Equipment Utilized During Treatment: Gait belt;Oxygen Activity Tolerance: Other (comment);Patient limited by fatigue (  self-limiting? DOE? fatigue?) Patient left: with call bell/phone within reach;with family/visitor present;in chair;with chair alarm set Nurse Communication: Mobility status PT Visit Diagnosis: Other abnormalities of gait and mobility (R26.89);Pain Pain - Right/Left: Right Pain - part of body: Hip     Time: 0867-6195 PT Time Calculation (min) (ACUTE ONLY): 26 min  Charges:  $Gait Training: 8-22 mins $Therapeutic Exercise: 8-22 mins                     Mckinzie Saksa M,PT Acute Rehab Services [phone]    Alvira Philips 07/09/2021, 2:46 PM

## 2021-07-09 NOTE — H&P (View-Only) (Signed)
[facility] Progress Note   71 y.o. male CAD, h/o GIB, chronic combined diastolic and systolic CHF, HTN, DM type 2, HLD, atrial fibrillation with RVR (not on coagulation due to recent GIB on 05/11/21), and HD-dependent AKI due to ANCA vasculitis and recent start on HD in April 2023 who presented to [facility] ED on 07/03/21 after falling at home.  Xray noted right displaced femoral neck fracture and was admitted for surgical intervention.    His wife reports that he has been having a tough time with HD due to hypotension and inability to sit in recliner for 4 hours due to discomfort and agitation.  He requires midodrine prior to HD.  Dialysis Orders: Center: [facility]  on TTS  on [address] 3hr 24, EDW 93kg  Nipro  400/500 HCO3 39 T37 2/2.5 bath RIJ TC Mircera 50 q2weeks last given 6/10  Assessment/ Plan:    Displaced right femoral neck fracture - Ortho right hip arthroplasty on 07/05/21.  ESRD -  Will plan to continue with TTS schedule.   Tolerated HD 6/13 w/ 1.4L net UF, 6/15 2.1L  No absolute indication for RRT and the patient appears to be  comfortable; TED hose to mobilize fluid. He's still up on his EDW; he has shingles left lower back and also with right hip arthroplasty. Refusing more than 3hr treatment.  Continue HD TTS regimen; HD tomorrow.   Hypertension/volume  - has 1+ edema but tachy and low BP.  Will UF as tolerated tomorrow and continue with midodrine before HD.  Anemia  - Will check iron stores (not sent yet) -> ordered;  ESA as needed. Mircera 50 q2weeks last given 6/10.  Metabolic bone disease -  continue with home meds, phos 10.4. Not on a binder as outpt yet -> start on renvela 2 tabs TIDM -> down from 10.4 to 5.3   Nutrition - renal diet carb, modified  ANCA vasculitis - s/p rituximab in April.  Per his wife, the biopsy revealed "a lot of scarring" and don't believe he will regain kidney function.  Continue with prednisone taper and PCP prophylaxis with  Bactrim.  Atrial fibrillation with RVR - not on anticoagulation due to recent GI bleed.  Cardiology consulted for rate control prior to surgery.  Chronic combined systolic and diastolic CHF - has edema but UF limited by hypotension and A fib RVR.  Will UF as tolerated with HD and continue to use midodrine for BP  Hyperkalemia - better.   Subjective:   Hungry, but otherwise doing well. Denies dyspnea, fever, chills, nausea Main complaints are the condom cath coming off, leaking and back pain from shingles. Tachy afib RVR seen by cards   Objective:   BP 113/79 (BP Location: Right Arm)   Pulse 77   Temp 98.7 F (37.1 C) (Oral)   Resp 18   Ht 5\' 11"  (1.803 m)   Wt 96.4 kg   SpO2 92%   BMI 29.64 kg/m   Intake/Output Summary (Last 24 hours) at 07/09/2021 0949 Last data filed at 07/08/2021 1130 Gross per 24 hour  Intake --  Output 2100 ml  Net -2100 ml   Weight change:   Physical Exam: General appearance: NAD Head: NCAT Eyes: negative scleral icterus Resp: clear to auscultation bilaterally Cardio: irregularly irregular rhythm  GI: SNDNT + BS Extremities: edema 1-2+ pretibial edema bilaterally  Access: RIJ TC Back: left lower flank with shingles, healing  Imaging: No results found.  Labs: [employer]  07/04/21 0553 07/04/21 2219 07/05/21 0317 07/06/21 0120 07/07/21 0137 07/08/21 0115 07/09/21 0607  NA 138 135 136 135 135 135 138  K 5.1 5.2* 5.0 5.9* 4.4 4.6 4.7  CL 98 96* 99 96* 95* 97* 99  CO2 25 23 22  19* 24 22 24   GLUCOSE 117* 157* 109* 158* 122* 151* 97  BUN 80* 92* 94* 110* 76* 89* 72*  CREATININE 5.36* 6.05* 6.16* 6.82* 5.39* 5.92* 5.55*  CALCIUM 8.2* 8.3* 8.0* 7.9* 7.6* 7.9* 8.1*  PHOS 7.1*  --  9.0* 10.4* 7.1* 7.8* 5.3*   CBC Recent Labs  Lab 07/03/21 1620 07/04/21 0553 07/06/21 0120 07/07/21 0137 07/08/21 0115 07/09/21 0607  WBC 7.3   < > 6.8 5.8 4.8 5.5  NEUTROABS 5.3  --   --   --   --   --   HGB 8.8*   < > 9.7* 8.9* 9.0* 9.4*   HCT 27.3*   < > 28.8* 27.1* 27.0* 28.9*  MCV 102.6*   < > 97.6 98.2 98.2 100.3*  PLT 145*   < > 132* 129* 129* 139*   < > = values in this interval not displayed.    Medications:     acetaminophen  650 mg Oral Q6H   Or   acetaminophen  650 mg Rectal Q6H   amiodarone  400 mg Oral BID   Followed by   Derrill Memo ON 07/14/2021] amiodarone  200 mg Oral Daily   atorvastatin  80 mg Oral Daily   calcium-vitamin D  1 tablet Oral Q breakfast   cefadroxil  500 mg Oral Daily   Chlorhexidine Gluconate Cloth  6 each Topical Q0600   metoprolol tartrate  37.5 mg Oral BID   midodrine  10 mg Oral Q T,Th,Sa-HD   omega-3 acid ethyl esters  1 g Oral BID   pantoprazole  40 mg Oral BID   predniSONE  20 mg Oral Q breakfast   QUEtiapine  100 mg Oral QHS   sevelamer carbonate  1,600 mg Oral TID WC   sulfamethoxazole-trimethoprim  1 tablet Oral Once per day on Mon Wed Fri   vitamin C  125 mg Oral Daily      Otelia Santee, MD 07/09/2021, 9:49 AM

## 2021-07-09 NOTE — Discharge Summary (Signed)
[facility] Discharge Summary  Patient name: Blake West Medical record number: [id number] Date of birth: [birth year] Age: 71 y.o. Gender: male Date of Admission: 07/03/2021  Date of Discharge: 07/09/2021 Admitting Physician: Blane Ohara McDiarmid, MD  Primary Care Provider: Leonel Ramsay, MD Consultants: Cardiology, nephrology, orthopedics, chaplain  Indication for Hospitalization: Right femoral neck fracture  Discharge Diagnoses/Problem List:  Principal Problem:   Displaced fracture of right femoral neck (HCC) Active Problems:   End stage renal disease on dialysis ([place])   ANCA-associated vasculitis ([place])   Heart failure with mid-range ejection fraction (HCC)   Chronic atrial fibrillation with RVR ([place])   Subclinical hypothyroidism   Macrocytic anemia   Disposition: SNF  Discharge Condition: Stable  Discharge Exam:  Blood pressure 113/79, pulse 77, temperature 98.7 F (37.1 C), temperature source Oral, resp. rate 18, height 5\' 11"  (1.803 m), weight 96.4 kg, SpO2 92 %.  General: awake, pleasantly conversational, NAD CV: Irregularly irregular, tachycardic, no murmurs auscultated Pulm: CTAB, normal WOB Extremities: Trace pitting edema of BLEs  Brief Hospital Course:  Blake West is a 71 y.o.male with a history of chronic A-fib with RVR, ESRD on HD, HTN, ANCA associated vasculitis, CAD, CHF, OSA on CPAP, GI ulcers, T2DM who was admitted to the [facility] at [facility] for right femoral neck fracture 2/2 fall. His hospital course is detailed below:  Displaced fracture right femoral neck Presented after slip and fall with imaging notable for displaced and comminuted fracture to the neck of proximal right femur.  Orthopedic surgery consulted and performed right hip hemiarthroplasty.  At the time of discharge, pain was well controlled and was started on Os-Cal and vitamin D supplementation.  Chronic A-fib with RVR Patient remained off  anticoagulation given recent GI bleeding and anemia which required 9 units PRBC transfusion during previous hospitalization.  CBC was trended throughout hospitalization and patient did not require transfusion.  Cardiology was consulted for management of A-fib with RVR and slowly increased metoprolol to finally metoprolol tartrate 37.5 mg twice daily.  Discharged with recommendations to continue metoprolol tartrate 37.5 mg twice daily, amiodarone 400 mg twice daily x7 days and then amiodarone 200 mg daily afterwards.  Coronary artery disease Patient was started on atorvastatin 80 mg daily, per cardiology.  Subclinical hypothyroidism Patient discontinued Synthroid in the past due to poor reaction (it is unknown what this poor reaction is).  Family is opted not to restart it.  Other chronic conditions were medically managed with home medications and formulary alternatives as necessary (T2DM, ANCA associated vasculitis, ESRD, OSA)  PCP Follow-up Recommendations: Recheck lipids and liver function in 8 weeks Consider initiation of bisphosphonate Ensure Amiodarone 200mg  daily  Significant Procedures: Right hip hemiarthroplasty  Significant Labs and Imaging:  Recent Labs  Lab 07/07/21 0137 07/08/21 0115 07/09/21 0607  WBC 5.8 4.8 5.5  HGB 8.9* 9.0* 9.4*  HCT 27.1* 27.0* 28.9*  PLT 129* 129* 139*   Recent Labs  Lab 07/03/21 1620 07/04/21 0553 07/04/21 2219 07/05/21 0317 07/06/21 0120 07/07/21 0137 07/08/21 0115 07/09/21 0607  NA 138   < > 135 136 135 135 135 138  K 4.7   < > 5.2* 5.0 5.9* 4.4 4.6 4.7  CL 97*   < > 96* 99 96* 95* 97* 99  CO2 24   < > 23 22 19* 24 22 24   GLUCOSE 137*   < > 157* 109* 158* 122* 151* 97  BUN 74*   < > 92*  94* 110* 76* 89* 72*  CREATININE 4.67*   < > 6.05* 6.16* 6.82* 5.39* 5.92* 5.55*  CALCIUM 8.1*   < > 8.3* 8.0* 7.9* 7.6* 7.9* 8.1*  MG  --   --  2.1  --   --   --   --   --   PHOS  --    < >  --  9.0* 10.4* 7.1* 7.8* 5.3*  ALKPHOS 39  --   --   --    --   --   --   --   AST 24  --   --   --   --   --   --   --   ALT 31  --   --   --   --   --   --   --   ALBUMIN 3.1*   < >  --  2.7* 2.9* 2.7* 2.5* 2.5*   < > = values in this interval not displayed.   Results/Tests Pending at Time of Discharge: None  Discharge Medications:  Allergies as of 07/09/2021       Reactions   Codeine Other (See Comments)   Joints hurt  Other reaction(s): Other (See Comments) Joints hurt  Other reaction(s): Other (See Comments) Joints hurt  Joints hurt    Citalopram Other (See Comments)   agitation   Gabapentin Other (See Comments)   agitation   Lyrica [pregabalin] Other (See Comments)   agitation        Medication List     STOP taking these medications    metoprolol succinate 50 MG 24 hr tablet Commonly known as: TOPROL-XL       TAKE these medications    acetaminophen 325 MG tablet Commonly known as: TYLENOL Take 650 mg by mouth every 6 (six) hours as needed for moderate pain.   acidophilus Caps capsule Take 2 capsules by mouth 3 (three) times daily.   amiodarone 200 MG tablet Commonly known as: PACERONE Take 2 tablets (400 mg total) by mouth 2 (two) times daily for 4 days, THEN 1 tablet (200 mg total) daily for 26 days. Start taking on: July 09, 2021   atorvastatin 80 MG tablet Commonly known as: LIPITOR Take 1 tablet (80 mg total) by mouth daily. Start taking on: July 10, 2021   B COMPLEX PO Take 1 capsule by mouth daily.   calcium-vitamin D 500-5 MG-MCG tablet Commonly known as: OSCAL WITH D Take 1 tablet by mouth.   cefadroxil 500 MG capsule Commonly known as: DURICEF Take 1 capsule (500 mg total) by mouth daily for 3 days. Start taking on: July 10, 2021   Cholecalciferol 25 MCG (1000 UT) tablet Take 1 tablet (1,000 Units total) by mouth daily.   magnesium 30 MG tablet Take 30 mg by mouth 2 (two) times daily.   Metoprolol Tartrate 37.5 MG Tabs Take 37.5 mg by mouth 2 (two) times daily.   midodrine 10 MG  tablet Commonly known as: PROAMATINE Take 10 mg by mouth 3 (three) times a week. Tuesday, Thursday, and Saturday   omega-3 acid ethyl esters 1 g capsule Commonly known as: LOVAZA Take 1 g by mouth 2 (two) times daily.   pantoprazole 40 MG tablet Commonly known as: PROTONIX Take 1 tablet (40 mg total) by mouth 2 (two) times daily.   polyethylene glycol 17 g packet Commonly known as: MIRALAX / GLYCOLAX Take 17 g by mouth daily as needed for mild constipation.   predniSONE 20  MG tablet Commonly known as: DELTASONE Take 1 tablet (20 mg total) by mouth daily with breakfast. What changed: how much to take   QUEtiapine 100 MG tablet Commonly known as: SEROQUEL Take 1 tablet (100 mg total) by mouth at bedtime.   sevelamer carbonate 800 MG tablet Commonly known as: RENVELA Take 2 tablets (1,600 mg total) by mouth 3 (three) times daily with meals.   sulfamethoxazole-trimethoprim 800-160 MG tablet Commonly known as: BACTRIM DS Take 1 tablet by mouth 3 (three) times a week.   thiamine 250 MG tablet Take 500 mg by mouth daily.   VITAMIN B6 PO Take 1 capsule by mouth daily.   vitamin C 100 MG tablet Take 100 mg by mouth daily.   VITAMIN E PO Take 1 Capful by mouth daily.        Discharge Instructions: Please refer to Patient Instructions section of EMR for full details.  Patient was counseled important signs and symptoms that should prompt return to medical care, changes in medications, dietary instructions, activity restrictions, and follow up appointments.   Follow-Up Appointments: No future appointments.  Wells Guiles, DO 07/09/2021, 11:48 AM PGY-1, [place]

## 2021-07-14 DIAGNOSIS — J189 Pneumonia, unspecified organism: Secondary | ICD-10-CM | POA: Diagnosis not present

## 2021-07-14 DIAGNOSIS — M6281 Muscle weakness (generalized): Secondary | ICD-10-CM | POA: Diagnosis not present

## 2021-07-16 ENCOUNTER — Encounter (INDEPENDENT_AMBULATORY_CARE_PROVIDER_SITE_OTHER): Payer: PPO

## 2021-07-16 ENCOUNTER — Other Ambulatory Visit (INDEPENDENT_AMBULATORY_CARE_PROVIDER_SITE_OTHER): Payer: Self-pay | Admitting: Nurse Practitioner

## 2021-07-16 ENCOUNTER — Ambulatory Visit (INDEPENDENT_AMBULATORY_CARE_PROVIDER_SITE_OTHER): Payer: PPO | Admitting: Nurse Practitioner

## 2021-07-16 DIAGNOSIS — N186 End stage renal disease: Secondary | ICD-10-CM

## 2021-07-19 ENCOUNTER — Telehealth (INDEPENDENT_AMBULATORY_CARE_PROVIDER_SITE_OTHER): Payer: Self-pay

## 2021-07-20 ENCOUNTER — Encounter (INDEPENDENT_AMBULATORY_CARE_PROVIDER_SITE_OTHER): Payer: PPO | Admitting: Nurse Practitioner

## 2021-07-21 ENCOUNTER — Encounter
Admission: RE | Disposition: A | Discharge status: Skilled Nursing Facility | Payer: Self-pay | Source: Home / Self Care | Attending: Vascular Surgery

## 2021-07-21 ENCOUNTER — Other Ambulatory Visit: Payer: Self-pay

## 2021-07-21 ENCOUNTER — Encounter: Payer: Self-pay | Admitting: Vascular Surgery

## 2021-07-21 ENCOUNTER — Ambulatory Visit
Admission: RE | Admit: 2021-07-21 | Discharge: 2021-07-21 | Disposition: A | Discharge status: Skilled Nursing Facility | Payer: PPO | Attending: Vascular Surgery | Admitting: Vascular Surgery

## 2021-07-21 DIAGNOSIS — I5042 Chronic combined systolic (congestive) and diastolic (congestive) heart failure: Secondary | ICD-10-CM | POA: Diagnosis not present

## 2021-07-21 DIAGNOSIS — I2510 Atherosclerotic heart disease of native coronary artery without angina pectoris: Secondary | ICD-10-CM | POA: Insufficient documentation

## 2021-07-21 DIAGNOSIS — I5022 Chronic systolic (congestive) heart failure: Secondary | ICD-10-CM | POA: Diagnosis not present

## 2021-07-21 DIAGNOSIS — Z992 Dependence on renal dialysis: Secondary | ICD-10-CM | POA: Insufficient documentation

## 2021-07-21 DIAGNOSIS — G4733 Obstructive sleep apnea (adult) (pediatric): Secondary | ICD-10-CM | POA: Diagnosis not present

## 2021-07-21 DIAGNOSIS — I132 Hypertensive heart and chronic kidney disease with heart failure and with stage 5 chronic kidney disease, or end stage renal disease: Secondary | ICD-10-CM | POA: Insufficient documentation

## 2021-07-21 DIAGNOSIS — Y841 Kidney dialysis as the cause of abnormal reaction of the patient, or of later complication, without mention of misadventure at the time of the procedure: Secondary | ICD-10-CM | POA: Insufficient documentation

## 2021-07-21 DIAGNOSIS — S72041K Displaced fracture of base of neck of right femur, subsequent encounter for closed fracture with nonunion: Secondary | ICD-10-CM | POA: Diagnosis not present

## 2021-07-21 DIAGNOSIS — M898X9 Other specified disorders of bone, unspecified site: Secondary | ICD-10-CM | POA: Insufficient documentation

## 2021-07-21 DIAGNOSIS — E1122 Type 2 diabetes mellitus with diabetic chronic kidney disease: Secondary | ICD-10-CM | POA: Diagnosis not present

## 2021-07-21 DIAGNOSIS — R262 Difficulty in walking, not elsewhere classified: Secondary | ICD-10-CM | POA: Diagnosis not present

## 2021-07-21 DIAGNOSIS — Z9181 History of falling: Secondary | ICD-10-CM | POA: Diagnosis not present

## 2021-07-21 DIAGNOSIS — N186 End stage renal disease: Secondary | ICD-10-CM | POA: Insufficient documentation

## 2021-07-21 DIAGNOSIS — D649 Anemia, unspecified: Secondary | ICD-10-CM | POA: Insufficient documentation

## 2021-07-21 DIAGNOSIS — T8249XA Other complication of vascular dialysis catheter, initial encounter: Secondary | ICD-10-CM | POA: Diagnosis not present

## 2021-07-21 DIAGNOSIS — I4891 Unspecified atrial fibrillation: Secondary | ICD-10-CM | POA: Diagnosis not present

## 2021-07-21 DIAGNOSIS — I7782 Antineutrophilic cytoplasmic antibody (ANCA) vasculitis: Secondary | ICD-10-CM | POA: Diagnosis not present

## 2021-07-21 DIAGNOSIS — M6281 Muscle weakness (generalized): Secondary | ICD-10-CM | POA: Diagnosis not present

## 2021-07-21 DIAGNOSIS — E875 Hyperkalemia: Secondary | ICD-10-CM | POA: Diagnosis not present

## 2021-07-21 DIAGNOSIS — Z79899 Other long term (current) drug therapy: Secondary | ICD-10-CM | POA: Diagnosis not present

## 2021-07-21 DIAGNOSIS — E1159 Type 2 diabetes mellitus with other circulatory complications: Secondary | ICD-10-CM | POA: Diagnosis not present

## 2021-07-21 DIAGNOSIS — E785 Hyperlipidemia, unspecified: Secondary | ICD-10-CM | POA: Insufficient documentation

## 2021-07-21 HISTORY — PX: DIALYSIS/PERMA CATHETER INSERTION: CATH118288

## 2021-07-21 LAB — POTASSIUM (ARMC VASCULAR LAB ONLY): Potassium (ARMC vascular lab): 4.4 mmol/L (ref 3.5–5.1)

## 2021-07-21 LAB — GLUCOSE, CAPILLARY: Glucose-Capillary: 78 mg/dL (ref 70–99)

## 2021-07-21 SURGERY — DIALYSIS/PERMA CATHETER INSERTION
Anesthesia: Moderate Sedation

## 2021-07-21 MED ORDER — DIPHENHYDRAMINE HCL 50 MG/ML IJ SOLN: 50.0000 mg | Freq: Once | INTRAMUSCULAR | Status: DC | PRN

## 2021-07-21 MED ORDER — HEPARIN SODIUM (PORCINE) 1000 UNIT/ML IJ SOLN
INTRAMUSCULAR | Status: AC
  Filled 2021-07-21: qty 10

## 2021-07-21 MED ORDER — ONDANSETRON HCL 4 MG/2ML IJ SOLN: 4.0000 mg | Freq: Four times a day (QID) | INTRAMUSCULAR | Status: DC | PRN

## 2021-07-21 MED ORDER — SODIUM CHLORIDE 0.9 % IV SOLN: INTRAVENOUS | Status: DC

## 2021-07-21 MED ORDER — MIDAZOLAM HCL 2 MG/2ML IJ SOLN
INTRAMUSCULAR | Status: DC | PRN
  Administered 2021-07-21: 1 mg via INTRAVENOUS

## 2021-07-21 MED ORDER — MIDAZOLAM HCL 2 MG/2ML IJ SOLN
INTRAMUSCULAR | Status: AC
  Filled 2021-07-21: qty 2

## 2021-07-21 MED ORDER — FENTANYL CITRATE PF 50 MCG/ML IJ SOSY
PREFILLED_SYRINGE | INTRAMUSCULAR | Status: AC
  Filled 2021-07-21: qty 1

## 2021-07-21 MED ORDER — HEPARIN SODIUM (PORCINE) 10000 UNIT/ML IJ SOLN
INTRAMUSCULAR | Status: AC
  Filled 2021-07-21: qty 1

## 2021-07-21 MED ORDER — HYDROMORPHONE HCL 1 MG/ML IJ SOLN: 1.0000 mg | Freq: Once | INTRAMUSCULAR | Status: DC | PRN

## 2021-07-21 MED ORDER — FAMOTIDINE 20 MG PO TABS: 40.0000 mg | ORAL_TABLET | Freq: Once | ORAL | Status: DC | PRN

## 2021-07-21 MED ORDER — MIDAZOLAM HCL 2 MG/ML PO SYRP: 8.0000 mg | ORAL_SOLUTION | Freq: Once | ORAL | Status: DC | PRN

## 2021-07-21 MED ORDER — CEFAZOLIN SODIUM-DEXTROSE 1-4 GM/50ML-% IV SOLN
INTRAVENOUS | Status: DC | PRN
  Administered 2021-07-21: 1 g via INTRAVENOUS

## 2021-07-21 MED ORDER — METHYLPREDNISOLONE SODIUM SUCC 125 MG IJ SOLR: 125.0000 mg | Freq: Once | INTRAMUSCULAR | Status: DC | PRN

## 2021-07-21 MED ORDER — FENTANYL CITRATE (PF) 100 MCG/2ML IJ SOLN
INTRAMUSCULAR | Status: DC | PRN
  Administered 2021-07-21: 25 ug via INTRAVENOUS

## 2021-07-21 MED ORDER — CEFAZOLIN SODIUM-DEXTROSE 1-4 GM/50ML-% IV SOLN: 1.0000 g | INTRAVENOUS | Status: DC

## 2021-07-21 MED ORDER — CEFAZOLIN SODIUM-DEXTROSE 1-4 GM/50ML-% IV SOLN
INTRAVENOUS | Status: AC
  Filled 2021-07-21: qty 50

## 2021-07-21 SURGICAL SUPPLY — 5 items
CATH PALIN MAXID VT KIT 19CM (CATHETERS) ×1 IMPLANT
GUIDEWIRE SUPER STIFF .035X180 (WIRE) ×1 IMPLANT
PACK ANGIOGRAPHY (CUSTOM PROCEDURE TRAY) ×1 IMPLANT
SUT MNCRL AB 4-0 PS2 18 (SUTURE) ×1 IMPLANT
SUT PROLENE 0 CT 1 30 (SUTURE) ×1 IMPLANT

## 2021-07-21 NOTE — Interval H&P Note (Signed)
History and Physical Interval Note:  07/21/2021 8:10 AM  Blake West  has presented today for surgery, with the diagnosis of Perma Cath Exchange   End Stage Renal.  The various methods of treatment have been discussed with the patient and family. After consideration of risks, benefits and other options for treatment, the patient has consented to  Procedure(s): DIALYSIS/PERMA CATHETER INSERTION (N/A) as a surgical intervention.  The patient's history has been reviewed, patient examined, no change in status, stable for surgery.  I have reviewed the patient's chart and labs.  Questions were answered to the patient's satisfaction.     [name]

## 2021-07-21 NOTE — Op Note (Signed)
OPERATIVE NOTE    PRE-OPERATIVE DIAGNOSIS: 1. ESRD 2. Non-functional permcath  POST-OPERATIVE DIAGNOSIS: same as above  PROCEDURE: Fluoroscopic guidance for placement of catheter Placement of a 19 cm tip to cuff tunneled hemodialysis catheter via the right internal jugular vein and removal of previous catheter  SURGEON: [name], MD  ANESTHESIA:  Local with moderate conscious sedation for 23 minutes using 1 mg of Versed and 25 mcg of Fentanyl  ESTIMATED BLOOD LOSS: 3 cc  FINDING(S): none  SPECIMEN(S):  None  INDICATIONS:   Patient is a 71 y.o.male who presents with non-functional dialysis catheter and ESRD.  The patient needs long term dialysis access for their ESRD, and a Permcath is necessary.  Risks and benefits are discussed and informed consent is obtained.    DESCRIPTION: After obtaining full informed written consent, the patient was brought back to the vascular suite. The patient received moderate conscious sedation during a face-to-face encounter with me present throughout the entire procedure and supervising the RN monitoring the vital signs, pulse oximetry, telemetry, and mental status throughout the entire procedure. The patient's existing catheter, right neck and chest were sterilely prepped and draped in a sterile surgical field was created.  The existing catheter was dissected free from the fibrous sheath securing the cuff with hemostats and blunt dissection.  A wire was placed. The existing catheter was then removed and the wire used to keep venous access. I selected a 19 cm tip to cuff tunneled dialysis catheter.  Using fluoroscopic guidance the catheter tips were parked in the right atrium. The appropriate distal connectors were placed. It withdrew blood well and flushed easily with heparinized saline and a concentrated heparin solution was then placed. It was secured to the chest wall with 2 Prolene sutures. A 4-0 Monocryl pursestring suture was placed around the exit  site. Sterile dressings were placed. The patient tolerated the procedure well and was taken to the recovery room in stable condition.  COMPLICATIONS: None  CONDITION: Stable  [name] 07/21/2021 10:15 AM   This note was created with Dragon Medical transcription system. Any errors in dictation are purely unintentional.

## 2021-07-24 DIAGNOSIS — E8779 Other fluid overload: Secondary | ICD-10-CM | POA: Diagnosis not present

## 2021-07-24 DIAGNOSIS — T82898A Other specified complication of vascular prosthetic devices, implants and grafts, initial encounter: Secondary | ICD-10-CM | POA: Diagnosis not present

## 2021-07-24 DIAGNOSIS — N179 Acute kidney failure, unspecified: Secondary | ICD-10-CM | POA: Diagnosis not present

## 2021-07-24 DIAGNOSIS — Z992 Dependence on renal dialysis: Secondary | ICD-10-CM | POA: Diagnosis not present

## 2021-07-24 DIAGNOSIS — D649 Anemia, unspecified: Secondary | ICD-10-CM | POA: Diagnosis not present

## 2021-07-24 DIAGNOSIS — N186 End stage renal disease: Secondary | ICD-10-CM | POA: Diagnosis not present

## 2021-07-26 DIAGNOSIS — S72001D Fracture of unspecified part of neck of right femur, subsequent encounter for closed fracture with routine healing: Secondary | ICD-10-CM | POA: Diagnosis not present

## 2021-07-27 ENCOUNTER — Encounter (INDEPENDENT_AMBULATORY_CARE_PROVIDER_SITE_OTHER): Payer: Self-pay | Admitting: Nurse Practitioner

## 2021-07-27 NOTE — Progress Notes (Signed)
The patient's visit was conducted on a very limited basis when discussion with the patient.  The patient was originally sent in for an HDA with a consult.  However the patient has no upper extremity access for [place] to utilize or do an HDA on.  It was subsequently the patient was sent in for upper extremity vein mapping however the patient noted that he did not wish to have any access in his upper extremities and wanted to have peritoneal dialysis.  Currently the patient has a PermCath but it is not functioning well.  Following this discussion with the patient it is determined that he needs a PermCath exchange.  We will arrange that for the patient.

## 2021-07-28 DIAGNOSIS — I5022 Chronic systolic (congestive) heart failure: Secondary | ICD-10-CM | POA: Diagnosis not present

## 2021-07-28 DIAGNOSIS — M6281 Muscle weakness (generalized): Secondary | ICD-10-CM | POA: Diagnosis not present

## 2021-07-28 DIAGNOSIS — E1159 Type 2 diabetes mellitus with other circulatory complications: Secondary | ICD-10-CM | POA: Diagnosis not present

## 2021-07-28 DIAGNOSIS — I2510 Atherosclerotic heart disease of native coronary artery without angina pectoris: Secondary | ICD-10-CM | POA: Diagnosis not present

## 2021-07-28 DIAGNOSIS — R262 Difficulty in walking, not elsewhere classified: Secondary | ICD-10-CM | POA: Diagnosis not present

## 2021-07-28 DIAGNOSIS — G4733 Obstructive sleep apnea (adult) (pediatric): Secondary | ICD-10-CM | POA: Diagnosis not present

## 2021-07-28 DIAGNOSIS — N186 End stage renal disease: Secondary | ICD-10-CM | POA: Diagnosis not present

## 2021-07-28 DIAGNOSIS — Z9181 History of falling: Secondary | ICD-10-CM | POA: Diagnosis not present

## 2021-07-28 DIAGNOSIS — S72041K Displaced fracture of base of neck of right femur, subsequent encounter for closed fracture with nonunion: Secondary | ICD-10-CM | POA: Diagnosis not present

## 2021-08-03 DIAGNOSIS — R262 Difficulty in walking, not elsewhere classified: Secondary | ICD-10-CM | POA: Diagnosis not present

## 2021-08-03 DIAGNOSIS — M6281 Muscle weakness (generalized): Secondary | ICD-10-CM | POA: Diagnosis not present

## 2021-08-03 DIAGNOSIS — M84757A Complete oblique atypical femoral fracture, right leg, initial encounter for fracture: Secondary | ICD-10-CM | POA: Diagnosis not present

## 2021-08-03 DIAGNOSIS — Z96641 Presence of right artificial hip joint: Secondary | ICD-10-CM | POA: Diagnosis not present

## 2021-08-06 DIAGNOSIS — D751 Secondary polycythemia: Secondary | ICD-10-CM | POA: Diagnosis not present

## 2021-08-06 DIAGNOSIS — Z96641 Presence of right artificial hip joint: Secondary | ICD-10-CM | POA: Diagnosis not present

## 2021-08-06 DIAGNOSIS — M6281 Muscle weakness (generalized): Secondary | ICD-10-CM | POA: Diagnosis not present

## 2021-08-06 DIAGNOSIS — R262 Difficulty in walking, not elsewhere classified: Secondary | ICD-10-CM | POA: Diagnosis not present

## 2021-08-06 DIAGNOSIS — M84757A Complete oblique atypical femoral fracture, right leg, initial encounter for fracture: Secondary | ICD-10-CM | POA: Diagnosis not present

## 2021-08-06 DIAGNOSIS — R2689 Other abnormalities of gait and mobility: Secondary | ICD-10-CM | POA: Diagnosis not present

## 2021-08-06 DIAGNOSIS — G4733 Obstructive sleep apnea (adult) (pediatric): Secondary | ICD-10-CM | POA: Diagnosis not present

## 2021-08-09 ENCOUNTER — Ambulatory Visit: Payer: PPO | Admitting: Surgery

## 2021-08-09 ENCOUNTER — Encounter: Payer: Self-pay | Admitting: Surgery

## 2021-08-09 ENCOUNTER — Other Ambulatory Visit: Payer: Self-pay

## 2021-08-09 VITALS — BP 111/79 | HR 134 | Temp 97.8°F | Ht 71.0 in | Wt 207.0 lb

## 2021-08-09 DIAGNOSIS — I7782 Antineutrophilic cytoplasmic antibody (ANCA) vasculitis: Secondary | ICD-10-CM | POA: Diagnosis not present

## 2021-08-09 DIAGNOSIS — D539 Nutritional anemia, unspecified: Secondary | ICD-10-CM | POA: Diagnosis not present

## 2021-08-09 DIAGNOSIS — N186 End stage renal disease: Secondary | ICD-10-CM | POA: Diagnosis not present

## 2021-08-09 DIAGNOSIS — G4733 Obstructive sleep apnea (adult) (pediatric): Secondary | ICD-10-CM | POA: Diagnosis not present

## 2021-08-09 DIAGNOSIS — I132 Hypertensive heart and chronic kidney disease with heart failure and with stage 5 chronic kidney disease, or end stage renal disease: Secondary | ICD-10-CM | POA: Diagnosis not present

## 2021-08-09 DIAGNOSIS — I509 Heart failure, unspecified: Secondary | ICD-10-CM | POA: Diagnosis not present

## 2021-08-09 DIAGNOSIS — Z992 Dependence on renal dialysis: Secondary | ICD-10-CM

## 2021-08-09 DIAGNOSIS — I482 Chronic atrial fibrillation, unspecified: Secondary | ICD-10-CM | POA: Diagnosis not present

## 2021-08-09 DIAGNOSIS — Z9181 History of falling: Secondary | ICD-10-CM | POA: Diagnosis not present

## 2021-08-09 DIAGNOSIS — W19XXXD Unspecified fall, subsequent encounter: Secondary | ICD-10-CM | POA: Diagnosis not present

## 2021-08-09 DIAGNOSIS — Z79891 Long term (current) use of opiate analgesic: Secondary | ICD-10-CM | POA: Diagnosis not present

## 2021-08-09 DIAGNOSIS — E1122 Type 2 diabetes mellitus with diabetic chronic kidney disease: Secondary | ICD-10-CM | POA: Diagnosis not present

## 2021-08-09 DIAGNOSIS — I2510 Atherosclerotic heart disease of native coronary artery without angina pectoris: Secondary | ICD-10-CM | POA: Diagnosis not present

## 2021-08-09 DIAGNOSIS — Z7952 Long term (current) use of systemic steroids: Secondary | ICD-10-CM | POA: Diagnosis not present

## 2021-08-09 DIAGNOSIS — M1030 Gout due to renal impairment, unspecified site: Secondary | ICD-10-CM | POA: Diagnosis not present

## 2021-08-09 DIAGNOSIS — D696 Thrombocytopenia, unspecified: Secondary | ICD-10-CM | POA: Diagnosis not present

## 2021-08-09 DIAGNOSIS — S72002D Fracture of unspecified part of neck of left femur, subsequent encounter for closed fracture with routine healing: Secondary | ICD-10-CM | POA: Diagnosis not present

## 2021-08-09 NOTE — Progress Notes (Unsigned)
Surgical Consultation  08/09/2021  Blake West is an 71 y.o. male.   Chief Complaint  Patient presents with   New Patient (Initial Visit)    PD catheter     HPI: Blake West is a 71 y.o.male with a history of chronic A-fib with RVR, ESRD on HD, HTN, ANCA associated vasculitis, CAD, CHF, OSA on CPAP, GI ulceration causing GI bleed, pulm fibrosis on home O2. HAd a recent fall w Right hip fx and underwent partial right hip replacement. He has been here today for possible laparoscopic peritoneal dialysis placement.  Of note he is mostly in a wheelchair and has significant mobility issues.  She comes accompanied by his wife.  He also is wearing oxygen.  Recently a permacath was placed by Dr. Lucky Cowboy.  Was recently hospitalized due to femoral neck fracture.  Due to the complexity it was partially fixed.  He is currently in a wheelchair. Hemoglobin of 9.4, dated count is 139,000.  BMP shows a creatinine of 5.5 albumin of 2.5 Does have a history of pulmonary fibrosis  Past Medical History:  Diagnosis Date   Abnormal EKG    Acute blood loss anemia (ABLA) 05/16/2021   Acute congestive heart failure ([place]) 04/25/2019   Acute hypoxemic respiratory failure ([place]) 04/23/2021   Acute renal failure superimposed on stage 4 chronic kidney disease ([place]) 04/19/2021   Acute urinary retention 04/25/2021   Biventricular failure (HCC)    CAD (coronary artery disease)    Class 2 severe obesity due to excess calories with serious comorbidity and body mass index (BMI) of 36.0 to 36.9 in adult [facility]) 04/25/2019   Colorectal polyps    Delirium 04/25/2021   GIB (gastrointestinal bleeding) 05/11/2021   Gout    Hematochezia    Hemorrhagic shock (HCC)    High anion gap metabolic acidosis 5/73/2202   Hyperkalemia 04/19/2021   Hyperlipidemia LDL goal <70    Hypertension    Hypokalemia 05/16/2021   Hypothyroidism    Kidney stone    Leukocytosis 04/29/2021   Malnutrition of moderate degree 04/21/2021   Multifocal pneumonia  04/19/2021   Pneumonia    Severe sepsis with acute organ dysfunction ([place]) 04/19/2021   SIRS (systemic inflammatory response syndrome) ([place]) 05/16/2021   Testicular hypofunction    Type 2 diabetes mellitus ([place]) 04/26/2019    Past Surgical History:  Procedure Laterality Date   CARDIAC CATHETERIZATION     COLONOSCOPY N/A 05/15/2021   Procedure: COLONOSCOPY;  Surgeon: Lin Landsman, MD;  Location: [facility] ENDOSCOPY;  Service: Gastroenterology;  Laterality: N/A;   DIALYSIS/PERMA CATHETER INSERTION N/A 04/22/2021   Procedure: DIALYSIS/PERMA CATHETER INSERTION;  Surgeon: Algernon Huxley, MD;  Location: [place] CV LAB;  Service: Cardiovascular;  Laterality: N/A;   DIALYSIS/PERMA CATHETER INSERTION N/A 06/07/2021   Procedure: DIALYSIS/PERMA CATHETER INSERTION;  Surgeon: Algernon Huxley, MD;  Location: [place] CV LAB;  Service: Cardiovascular;  Laterality: N/A;   DIALYSIS/PERMA CATHETER INSERTION N/A 07/21/2021   Procedure: DIALYSIS/PERMA CATHETER INSERTION;  Surgeon: Algernon Huxley, MD;  Location: [place] CV LAB;  Service: Cardiovascular;  Laterality: N/A;   ESOPHAGOGASTRODUODENOSCOPY (EGD) WITH PROPOFOL N/A 05/13/2021   Procedure: ESOPHAGOGASTRODUODENOSCOPY (EGD) WITH PROPOFOL;  Surgeon: Jonathon Bellows, MD;  Location: [place] ENDOSCOPY;  Service: Gastroenterology;  Laterality: N/A;   FOOT SURGERY     HIP ARTHROPLASTY Right 07/05/2021   Procedure: ARTHROPLASTY BIPOLAR HIP (HEMIARTHROPLASTY);  Surgeon: Willaim Sheng, MD;  Location: [place];  Service: Orthopedics;  Laterality: Right;   RIGHT/LEFT HEART  CATH AND CORONARY ANGIOGRAPHY N/A 04/30/2019   Procedure: RIGHT/LEFT HEART CATH AND CORONARY ANGIOGRAPHY;  Surgeon: Troy Sine, MD;  Location: [place] CV LAB;  Service: Cardiovascular;  Laterality: N/A;    Family History  Problem Relation Age of Onset   Heart attack Mother 37   Pulmonary fibrosis Father    Congestive Heart Failure Brother    Heart attack Brother    Heart attack Brother     Early death Sister    Kidney disease Paternal Grandfather    Diabetes Sister     Social History:  reports that he quit smoking about 19 years ago. His smoking use included cigarettes. He has a 30.00 pack-year smoking history. He has never used smokeless tobacco. He reports that he does not drink alcohol and does not use drugs.  Allergies:  Allergies  Allergen Reactions   Codeine Other (See Comments)    Joints hurt  Other reaction(s): Other (See Comments) Joints hurt  Other reaction(s): Other (See Comments) Joints hurt  Joints hurt     Citalopram Other (See Comments)    agitation   Gabapentin Other (See Comments)    agitation   Lyrica [Pregabalin] Other (See Comments)    agitation    Medications reviewed.     ROS Full ROS performed and is otherwise negative other than what is stated in the HPI    BP 111/79   Pulse (!) 134   Temp 97.8 F (36.6 C) (Oral)   Ht 5\' 11"  (1.803 m)   Wt 207 lb (93.9 kg)   SpO2 (!) 84%   BMI 28.87 kg/m   Physical Exam Vitals and nursing note reviewed. Exam conducted with a chaperone present.  Constitutional:      General: He is not in acute distress.    Appearance: Normal appearance. He is ill-appearing.     Comments: Debilitates wearing oxygen and is on a wheelchair  Eyes:     General: No scleral icterus.       Right eye: No discharge.        Left eye: No discharge.  Cardiovascular:     Rate and Rhythm: Regular rhythm.     Heart sounds: No murmur heard.    No friction rub.     Comments: Irregular rythm Pulmonary:     Effort: Respiratory distress present.     Breath sounds: No stridor. Rhonchi present. No wheezing.  Abdominal:     General: Abdomen is flat. There is no distension.     Palpations: There is no mass.     Tenderness: There is no abdominal tenderness. There is no guarding or rebound.     Hernia: A hernia is present.  Musculoskeletal:        General: No swelling. Normal range of motion.     Cervical back:  Normal range of motion and neck supple. No rigidity or tenderness.  Skin:    General: Skin is warm and dry.     Capillary Refill: Capillary refill takes less than 2 seconds.  Neurological:     General: No focal deficit present.     Mental Status: He is alert and oriented to person, place, and time.  Psychiatric:        Mood and Affect: Mood normal.        Behavior: Behavior normal.        Thought Content: Thought content normal.        Judgment: Judgment normal.      Assessment/Plan:  71  debilitated male with end-stage renal disease on dialysis.  He initially interested in peritoneal dialysis.  We had an extensive discussion with the patient regarding laparoscopic PD catheter.  I am concerned he is overall debility and medical conditions.  He is only able to walk for a few steps and is for the most part on a wheelchair.  He does have anasarca. Did explain to him that I will be willing to perform PD catheter placement but this was only a temporizing measure.  I am concerned about his lungs and his heart as well.  I was very candid with them about the situation.  The patient wishes to think through whether or not he wishes to have peritoneal dialysis or not.  Procedure was explained to them in detail.  Risks, benefits and possible medications including but not limited to: Bleeding, infection injury to the bowel or other structures.  Also possibility of revision and nonfunctional catheter were discussed with them he will call us back once he has made up his mind. .  Please note that I spent greater than 60 minutes in this encounter including personally reviewing imaging studies, coordinating his care, placing orders  and performing appropriate documentation  Caroleen Hamman, MD [facility] Surgeon

## 2021-08-09 NOTE — Patient Instructions (Addendum)
Please let us know if you decide to move forward with surgery for the PD catheter.

## 2021-08-18 ENCOUNTER — Other Ambulatory Visit: Payer: Self-pay

## 2021-08-18 ENCOUNTER — Emergency Department (HOSPITAL_COMMUNITY): Payer: PPO

## 2021-08-18 ENCOUNTER — Emergency Department (HOSPITAL_COMMUNITY)
Admission: EM | Admit: 2021-08-18 | Discharge: 2021-08-18 | Disposition: A | Discharge status: Home / Self Care | Payer: PPO | Attending: Emergency Medicine | Admitting: Emergency Medicine

## 2021-08-18 DIAGNOSIS — M47814 Spondylosis without myelopathy or radiculopathy, thoracic region: Secondary | ICD-10-CM | POA: Diagnosis not present

## 2021-08-18 DIAGNOSIS — Y92009 Unspecified place in unspecified non-institutional (private) residence as the place of occurrence of the external cause: Secondary | ICD-10-CM | POA: Diagnosis not present

## 2021-08-18 DIAGNOSIS — T84020A Dislocation of internal right hip prosthesis, initial encounter: Secondary | ICD-10-CM | POA: Diagnosis not present

## 2021-08-18 DIAGNOSIS — Z79899 Other long term (current) drug therapy: Secondary | ICD-10-CM | POA: Diagnosis not present

## 2021-08-18 DIAGNOSIS — M7981 Nontraumatic hematoma of soft tissue: Secondary | ICD-10-CM | POA: Diagnosis not present

## 2021-08-18 DIAGNOSIS — I700 Atherosclerosis of aorta: Secondary | ICD-10-CM | POA: Diagnosis not present

## 2021-08-18 DIAGNOSIS — J9611 Chronic respiratory failure with hypoxia: Secondary | ICD-10-CM

## 2021-08-18 DIAGNOSIS — D72829 Elevated white blood cell count, unspecified: Secondary | ICD-10-CM | POA: Insufficient documentation

## 2021-08-18 DIAGNOSIS — I509 Heart failure, unspecified: Secondary | ICD-10-CM | POA: Insufficient documentation

## 2021-08-18 DIAGNOSIS — R001 Bradycardia, unspecified: Secondary | ICD-10-CM | POA: Diagnosis not present

## 2021-08-18 DIAGNOSIS — I517 Cardiomegaly: Secondary | ICD-10-CM | POA: Diagnosis not present

## 2021-08-18 DIAGNOSIS — I4891 Unspecified atrial fibrillation: Secondary | ICD-10-CM | POA: Diagnosis not present

## 2021-08-18 DIAGNOSIS — Z743 Need for continuous supervision: Secondary | ICD-10-CM | POA: Diagnosis not present

## 2021-08-18 DIAGNOSIS — Z96641 Presence of right artificial hip joint: Secondary | ICD-10-CM | POA: Diagnosis not present

## 2021-08-18 DIAGNOSIS — N184 Chronic kidney disease, stage 4 (severe): Secondary | ICD-10-CM | POA: Insufficient documentation

## 2021-08-18 DIAGNOSIS — I10 Essential (primary) hypertension: Secondary | ICD-10-CM | POA: Diagnosis not present

## 2021-08-18 DIAGNOSIS — S73004A Unspecified dislocation of right hip, initial encounter: Secondary | ICD-10-CM | POA: Insufficient documentation

## 2021-08-18 DIAGNOSIS — J984 Other disorders of lung: Secondary | ICD-10-CM | POA: Diagnosis not present

## 2021-08-18 DIAGNOSIS — R531 Weakness: Secondary | ICD-10-CM | POA: Diagnosis not present

## 2021-08-18 DIAGNOSIS — S300XXA Contusion of lower back and pelvis, initial encounter: Secondary | ICD-10-CM | POA: Diagnosis not present

## 2021-08-18 DIAGNOSIS — E039 Hypothyroidism, unspecified: Secondary | ICD-10-CM | POA: Insufficient documentation

## 2021-08-18 DIAGNOSIS — I2510 Atherosclerotic heart disease of native coronary artery without angina pectoris: Secondary | ICD-10-CM | POA: Insufficient documentation

## 2021-08-18 DIAGNOSIS — S299XXA Unspecified injury of thorax, initial encounter: Secondary | ICD-10-CM | POA: Diagnosis not present

## 2021-08-18 DIAGNOSIS — I130 Hypertensive heart and chronic kidney disease with heart failure and stage 1 through stage 4 chronic kidney disease, or unspecified chronic kidney disease: Secondary | ICD-10-CM | POA: Diagnosis not present

## 2021-08-18 DIAGNOSIS — W19XXXA Unspecified fall, initial encounter: Secondary | ICD-10-CM | POA: Diagnosis not present

## 2021-08-18 DIAGNOSIS — W050XXA Fall from non-moving wheelchair, initial encounter: Secondary | ICD-10-CM | POA: Insufficient documentation

## 2021-08-18 DIAGNOSIS — S79911A Unspecified injury of right hip, initial encounter: Secondary | ICD-10-CM | POA: Diagnosis present

## 2021-08-18 DIAGNOSIS — R000 Tachycardia, unspecified: Secondary | ICD-10-CM | POA: Diagnosis not present

## 2021-08-18 DIAGNOSIS — E1122 Type 2 diabetes mellitus with diabetic chronic kidney disease: Secondary | ICD-10-CM | POA: Insufficient documentation

## 2021-08-18 LAB — CBC WITH DIFFERENTIAL/PLATELET
Abs Immature Granulocytes: 0 10*3/uL (ref 0.00–0.07)
Basophils Absolute: 0.4 10*3/uL — ABNORMAL HIGH (ref 0.0–0.1)
Basophils Relative: 3 %
Eosinophils Absolute: 0.3 10*3/uL (ref 0.0–0.5)
Eosinophils Relative: 2 %
HCT: 26.9 % — ABNORMAL LOW (ref 39.0–52.0)
Hemoglobin: 8.3 g/dL — ABNORMAL LOW (ref 13.0–17.0)
Lymphocytes Relative: 36 %
Lymphs Abs: 4.6 10*3/uL — ABNORMAL HIGH (ref 0.7–4.0)
MCH: 30.4 pg (ref 26.0–34.0)
MCHC: 30.9 g/dL (ref 30.0–36.0)
MCV: 98.5 fL (ref 80.0–100.0)
Monocytes Absolute: 0.4 10*3/uL (ref 0.1–1.0)
Monocytes Relative: 3 %
Neutro Abs: 7.1 10*3/uL (ref 1.7–7.7)
Neutrophils Relative %: 56 %
Platelets: 205 10*3/uL (ref 150–400)
RBC: 2.73 MIL/uL — ABNORMAL LOW (ref 4.22–5.81)
RDW: 18.6 % — ABNORMAL HIGH (ref 11.5–15.5)
WBC: 12.7 10*3/uL — ABNORMAL HIGH (ref 4.0–10.5)
nRBC: 0 % (ref 0.0–0.2)
nRBC: 0 /100 WBC

## 2021-08-18 LAB — BASIC METABOLIC PANEL
Anion gap: 11 (ref 5–15)
BUN: 66 mg/dL — ABNORMAL HIGH (ref 8–23)
CO2: 27 mmol/L (ref 22–32)
Calcium: 8.1 mg/dL — ABNORMAL LOW (ref 8.9–10.3)
Chloride: 104 mmol/L (ref 98–111)
Creatinine, Ser: 5.7 mg/dL — ABNORMAL HIGH (ref 0.61–1.24)
GFR, Estimated: 10 mL/min — ABNORMAL LOW (ref >60)
Glucose, Bld: 126 mg/dL — ABNORMAL HIGH (ref 70–99)
Potassium: 4.7 mmol/L (ref 3.5–5.1)
Sodium: 142 mmol/L (ref 135–145)

## 2021-08-18 MED ORDER — MORPHINE SULFATE (PF) 2 MG/ML IV SOLN
2.0000 mg | Freq: Once | INTRAVENOUS | Status: AC
  Administered 2021-08-18: 2 mg via INTRAVENOUS
  Filled 2021-08-18: qty 1

## 2021-08-18 MED ORDER — KETAMINE HCL 50 MG/5ML IJ SOSY
PREFILLED_SYRINGE | INTRAMUSCULAR | Status: AC
  Administered 2021-08-18: 25 mg via INTRAVENOUS
  Filled 2021-08-18: qty 5

## 2021-08-18 MED ORDER — KETAMINE HCL 50 MG/5ML IJ SOSY: 0.5000 mg/kg | PREFILLED_SYRINGE | Freq: Once | INTRAMUSCULAR | Status: AC

## 2021-08-18 MED ORDER — PROPOFOL 10 MG/ML IV BOLUS
1.0000 mg/kg | Freq: Once | INTRAVENOUS | Status: AC
  Administered 2021-08-18: 50 mg via INTRAVENOUS
  Filled 2021-08-18: qty 20

## 2021-08-18 MED ORDER — MORPHINE SULFATE (PF) 4 MG/ML IV SOLN
4.0000 mg | Freq: Once | INTRAVENOUS | Status: AC
  Administered 2021-08-18: 4 mg via INTRAVENOUS
  Filled 2021-08-18: qty 1

## 2021-08-18 MED ORDER — ONDANSETRON HCL 4 MG/2ML IJ SOLN
4.0000 mg | Freq: Once | INTRAMUSCULAR | Status: AC
  Administered 2021-08-18: 4 mg via INTRAVENOUS
  Filled 2021-08-18: qty 2

## 2021-08-18 MED ORDER — METOPROLOL TARTRATE 5 MG/5ML IV SOLN
5.0000 mg | Freq: Once | INTRAVENOUS | Status: AC
  Administered 2021-08-18: 5 mg via INTRAVENOUS
  Filled 2021-08-18: qty 5

## 2021-08-18 NOTE — ED Notes (Signed)
X-ray at bedside

## 2021-08-18 NOTE — ED Provider Notes (Signed)
[facility] Provider Note   CSN: [id number] Arrival date & time: 08/18/21  0135     History  Chief Complaint  Patient presents with   Lytle Michaels    [facility] is a 71 y.o. male.  HPI     This is a 71 year old male who presents following a fall.  Patient reports that he was transferring from the toilet to his wheelchair when he fell onto his right side.  Recent history of hip arthroplasty on 6/12 on that right side.  Patient denies hitting his head or loss of consciousness.  Denies syncope.  Is only complaining of right leg pain.  Denies numbness or tingling.  Chart reviewed.  Hip arthroplasty on 6/12.  Was discharged on home oxygen.  Home Medications Prior to Admission medications   Medication Sig Start Date End Date Taking? Authorizing Provider  acetaminophen (TYLENOL) 325 MG tablet Take 650 mg by mouth every 6 (six) hours as needed for moderate pain.    [provider]  acidophilus (RISAQUAD) CAPS capsule Take 2 capsules by mouth 3 (three) times daily. 04/30/21   Annita Brod, MD  amiodarone (PACERONE) 200 MG tablet Take 2 tablets (400 mg total) by mouth 2 (two) times daily for 4 days, THEN 1 tablet (200 mg total) daily for 26 days. 07/09/21 08/08/21  Alcus Dad, MD  Ascorbic Acid (VITAMIN C) 100 MG tablet Take 100 mg by mouth daily.    [provider]  atorvastatin (LIPITOR) 80 MG tablet Take 1 tablet (80 mg total) by mouth daily. 07/10/21   Alcus Dad, MD  B Complex Vitamins (B COMPLEX PO) Take 1 capsule by mouth daily.    [provider]  calcium-vitamin D (OSCAL WITH D) 500-5 MG-MCG tablet Take 1 tablet by mouth.    [provider]  Cholecalciferol 25 MCG (1000 UT) tablet Take 1 tablet (1,000 Units total) by mouth daily. 07/09/21   Alcus Dad, MD  magnesium 30 MG tablet Take 30 mg by mouth 2 (two) times daily.    [provider]  Metoprolol Tartrate 37.5 MG TABS Take 37.5 mg by  mouth 2 (two) times daily. 07/09/21   Alcus Dad, MD  midodrine (PROAMATINE) 10 MG tablet Take 10 mg by mouth 3 (three) times a week. Tuesday, Thursday, and Saturday 07/01/21   [provider]  omega-3 acid ethyl esters (LOVAZA) 1 g capsule Take 1 g by mouth 2 (two) times daily.    [provider]  pantoprazole (PROTONIX) 40 MG tablet Take 1 tablet (40 mg total) by mouth 2 (two) times daily. 05/18/21   Ezekiel Slocumb, DO  polyethylene glycol (MIRALAX / GLYCOLAX) 17 g packet Take 17 g by mouth daily as needed for mild constipation. 07/09/21   Alcus Dad, MD  predniSONE (DELTASONE) 20 MG tablet Take 1 tablet (20 mg total) by mouth daily with breakfast. 07/09/21   Alcus Dad, MD  Pyridoxine HCl (VITAMIN B6 PO) Take 1 capsule by mouth daily.    [provider]  QUEtiapine (SEROQUEL) 100 MG tablet Take 1 tablet (100 mg total) by mouth at bedtime. 04/30/21   Annita Brod, MD  sevelamer carbonate (RENVELA) 800 MG tablet Take 2 tablets (1,600 mg total) by mouth 3 (three) times daily with meals. 07/09/21   Alcus Dad, MD  thiamine 250 MG tablet Take 500 mg by mouth daily.    [provider]  VITAMIN E PO Take 1 Capful by mouth daily.  [provider]      Allergies    Codeine, Citalopram, Gabapentin, and Lyrica [pregabalin]    Review of Systems   Review of Systems  Constitutional:  Negative for fever.  Respiratory:  Positive for cough. Negative for shortness of breath.   Musculoskeletal:        Hip pain  All other systems reviewed and are negative.   Physical Exam Updated Vital Signs BP 107/77 (BP Location: Left Arm)   Pulse 80   Temp 98.1 F (36.7 C) (Oral)   Resp 20   Ht 1.803 m (5\' 11" )   Wt 93.9 kg   SpO2 100%   BMI 28.87 kg/m  Physical Exam Vitals and nursing note reviewed.  Constitutional:      Appearance: He is well-developed.     Comments: Chronically ill-appearing, nontoxic, ABCs intact  HENT:     Head:  Normocephalic and atraumatic.     Mouth/Throat:     Mouth: Mucous membranes are dry.  Eyes:     Pupils: Pupils are equal, round, and reactive to light.  Cardiovascular:     Rate and Rhythm: Normal rate and regular rhythm.     Heart sounds: Normal heart sounds. No murmur heard. Pulmonary:     Effort: Pulmonary effort is normal. No respiratory distress.     Breath sounds: Normal breath sounds. No wheezing.     Comments: Cough, coarse breath sounds bilaterally Abdominal:     Palpations: Abdomen is soft.     Tenderness: There is no abdominal tenderness. There is no rebound.  Musculoskeletal:     Cervical back: Neck supple.     Comments: Tenderness palpation right hip, no obvious deformity, patient lying on left side for comfort but hip does appear slightly displaced and leg foreshortened, well-healing scar right hip Neurovascularly intact distally  Lymphadenopathy:     Cervical: No cervical adenopathy.  Skin:    General: Skin is warm and dry.  Neurological:     Mental Status: He is alert and oriented to person, place, and time.  Psychiatric:        Mood and Affect: Mood normal.     ED Results / Procedures / Treatments   Labs (all labs ordered are listed, but only abnormal results are displayed) Labs Reviewed  CBC WITH DIFFERENTIAL/PLATELET - Abnormal; Notable for the following components:      Result Value   WBC 12.7 (*)    RBC 2.73 (*)    Hemoglobin 8.3 (*)    HCT 26.9 (*)    RDW 18.6 (*)    Lymphs Abs 4.6 (*)    Basophils Absolute 0.4 (*)    All other components within normal limits  BASIC METABOLIC PANEL - Abnormal; Notable for the following components:   Glucose, Bld 126 (*)    BUN 66 (*)    Creatinine, Ser 5.70 (*)    Calcium 8.1 (*)    GFR, Estimated 10 (*)    All other components within normal limits    EKG EKG Interpretation  Date/Time:  Wednesday August 18 2021 04:21:42 EDT Ventricular Rate:  129 PR Interval:    QRS Duration: 111 QT Interval:  337 QTC  Calculation: 462 R Axis:   147 Text Interpretation: Atrial fibrillation Low voltage, extremity and precordial leads Abnormal lateral Q waves Probable anteroseptal infarct, old When compared with ECG of 07/06/2021, No significant change was found Confirmed by Delora Fuel ([id number]) on 08/18/2021 5:47:53 AM  Radiology DG Hip Unilat W or Wo  Pelvis 2-3 Views Right  Result Date: 08/18/2021 CLINICAL DATA:  71 year old male, reduced right prosthetic hip dislocation. EXAM: DG HIP (WITH OR WITHOUT PELVIS) 2-3V RIGHT COMPARISON:  0228 hours today. FINDINGS: AP portable right hip series 0512 hours. Right bipolar hip arthroplasty dislocation appears reduced from earlier today. Acetabular component alignment now appears similar to 07/05/2021. The entire right femoral stem is not included. No acute osseous abnormality identified. Negative visible bowel gas. IMPRESSION: Relocation of the right hip arthroplasty since 0228 hours today. No acute fracture identified. Electronically Signed   By: Genevie Ann M.D.   On: 08/18/2021 05:56   DG Hip Unilat W or Wo Pelvis 2-3 Views Right  Result Date: 08/18/2021 CLINICAL DATA:  Fall injury. EXAM: DG HIP (WITH OR WITHOUT PELVIS) 2-3V RIGHT; PORTABLE CHEST - 1 VIEW COMPARISON:  Portable chest 07/03/2021, PA Lat 06/04/2021, AP pelvis and right hip 07/05/2021. FINDINGS: Chest: There is a right IJ dialysis catheter terminating in the upper right atrium. There is mild cardiomegaly with normal caliber central vessels. Scarring changes in the lower lung fields are again noted. There is a low inspiration with no new opacity concerning for acute pneumonia. No pleural effusion is seen. Stable mediastinal configuration with calcification in the aortic arch. No acute osseous abnormality is seen. There is osteopenia and degenerative change of the spine. AP pelvis with right hip: The entire right hip arthroplasty including the acetabular cup prosthesis has dislocated superiorly compared to the prior  study. There is calcific debris lateral to the lower native acetabular bone which could be due to acetabular chip fractures, although some if not all of this was seen on the prior study. There is no further evidence of fractures. No pelvic fracture or diastasis is seen. IMPRESSION: 1. Low-inspiration chest study with chronic change in the bases. No convincing acute process. 2. Superior dislocation of the entire right hip arthroplasty including the acetabular cup prosthesis. 3. Calcific debris superimposing lateral to the lower native right acetabulum. Some if not all of this was present on the prior study although 1 or more new chip fractures off of the acetabular bone could be present within this. There is no further evidence of fractures. Electronically Signed   By: Telford Nab M.D.   On: 08/18/2021 03:10   DG Chest Portable 1 View  Result Date: 08/18/2021 CLINICAL DATA:  Fall injury. EXAM: DG HIP (WITH OR WITHOUT PELVIS) 2-3V RIGHT; PORTABLE CHEST - 1 VIEW COMPARISON:  Portable chest 07/03/2021, PA Lat 06/04/2021, AP pelvis and right hip 07/05/2021. FINDINGS: Chest: There is a right IJ dialysis catheter terminating in the upper right atrium. There is mild cardiomegaly with normal caliber central vessels. Scarring changes in the lower lung fields are again noted. There is a low inspiration with no new opacity concerning for acute pneumonia. No pleural effusion is seen. Stable mediastinal configuration with calcification in the aortic arch. No acute osseous abnormality is seen. There is osteopenia and degenerative change of the spine. AP pelvis with right hip: The entire right hip arthroplasty including the acetabular cup prosthesis has dislocated superiorly compared to the prior study. There is calcific debris lateral to the lower native acetabular bone which could be due to acetabular chip fractures, although some if not all of this was seen on the prior study. There is no further evidence of fractures. No  pelvic fracture or diastasis is seen. IMPRESSION: 1. Low-inspiration chest study with chronic change in the bases. No convincing acute process. 2. Superior dislocation of the entire  right hip arthroplasty including the acetabular cup prosthesis. 3. Calcific debris superimposing lateral to the lower native right acetabulum. Some if not all of this was present on the prior study although 1 or more new chip fractures off of the acetabular bone could be present within this. There is no further evidence of fractures. Electronically Signed   By: Telford Nab M.D.   On: 08/18/2021 03:10    Procedures Reduction of dislocation  Date/Time: 08/18/2021 7:58 AM  Performed by: Merryl Hacker, MD Authorized by: Merryl Hacker, MD  Consent: Verbal consent obtained. Written consent obtained. Risks and benefits: risks, benefits and alternatives were discussed Consent given by: patient Required items: required blood products, implants, devices, and special equipment available Patient identity confirmed: verbally with patient Time out: Immediately prior to procedure a "time out" was called to verify the correct patient, procedure, equipment, support staff and site/side marked as required. Local anesthesia used: no  Anesthesia: Local anesthesia used: no  Sedation: Patient sedated: yes Sedation type: moderate (conscious) sedation Sedatives: ketamine and propofol Sedation start date/time: 08/18/2021 5:00 AM Sedation end date/time: 08/18/2021 5:30 AM Vitals: Vital signs were monitored during sedation.  Patient tolerance: patient tolerated the procedure well with no immediate complications Comments: Patient was sedated with 25 mg of ketamine and 40 mg of propofol.  Adequate sedation achieved.  Hip was relocated easily.  X-rays confirm reduction.  Patient did require jaw thrust for a brief period of time and a nonrebreather for brief desaturation.       Medications Ordered in ED Medications   morphine (PF) 4 MG/ML injection 4 mg (4 mg Intravenous Given 08/18/21 0212)  ondansetron (ZOFRAN) injection 4 mg (4 mg Intravenous Given 08/18/21 0212)  propofol (DIPRIVAN) 10 mg/mL bolus/IV push 93.9 mg (50 mg Intravenous Given 08/18/21 0458)  morphine (PF) 2 MG/ML injection 2 mg (2 mg Intravenous Given 08/18/21 0445)  metoprolol tartrate (LOPRESSOR) injection 5 mg (5 mg Intravenous Given 08/18/21 0444)  ketamine 50 mg in normal saline 5 mL (10 mg/mL) syringe (25 mg Intravenous Given 08/18/21 0458)    ED Course/ Medical Decision Making/ A&P Clinical Course as of 08/18/21 0758  Wed Aug 18, 2021  0302 X-rays reviewed.  Patient appears to have a prosthetic right hip dislocation.  Requested to be placed in a room by charge nurse. [CH]  0530 Spoke again with Dr. Griffin Basil.  Postreduction.  Patient easily reduced with minimal sedation.  Concern for increased laxity.  Recommend CT scan. [CH]    Clinical Course User Index [CH] Moranda Billiot, Barbette Hair, MD                           Medical Decision Making Amount and/or Complexity of Data Reviewed Labs: ordered. Radiology: ordered.  Risk Prescription drug management.   This patient presents to the ED for concern of fall, hip pain, this involves an extensive number of treatment options, and is a complaint that carries with it a high risk of complications and morbidity.  I considered the following differential and admission for this acute, potentially life threatening condition.  The differential diagnosis includes fracture, dislocation, head injury  MDM:    This is a 71 year old male with multiple medical problems who presents following a fall.  Reports a mechanical fall.  Reports hip pain.  Recent hip arthroplasty.  Labs obtained.  Patient is in atrial fibrillation with variable response.  X-rays are notable for a right hip dislocation.  Spoke to  orthopedist on-call.  Request attempts at sedation and reduction.  Patient is a high risk candidate.  I did  discuss this with the patient and his wife.  We will attempt sedation with low-dose medication and my colleague was present for this as well.  Successful reduction with minimal complication.  Patient had brief desaturation.  He received 1 dose of metoprolol for atrial fibrillation.  Repeat x-ray showed good reduction.  CT scan was obtained given laxity of joint.  CT was reviewed by the orthopedist.  Patient was placed in a knee immobilizer and given posterior hip precautions.  He has follow-up on July 31.  (Labs, imaging, consults)  Labs: I Ordered, and personally interpreted labs.  The pertinent results include: CBC, BMP, Imaging Studies ordered: I ordered imaging studies including x-ray, hip x-ray, CT hip I independently visualized and interpreted imaging. I agree with the radiologist interpretation  Additional history obtained from wife at bedside.  External records from outside source obtained and reviewed including OR records  Cardiac Monitoring: The patient was maintained on a cardiac monitor.  I personally viewed and interpreted the cardiac monitored which showed an underlying rhythm of: Atrial fibrillation  Reevaluation: After the interventions noted above, I reevaluated the patient and found that they have :improved  Social Determinants of Health: Lives independently  Disposition: Discharge  Co morbidities that complicate the patient evaluation  Past Medical History:  Diagnosis Date   Abnormal EKG    Acute blood loss anemia ([facility]) 05/16/2021   Acute congestive heart failure ([place]) 04/25/2019   Acute hypoxemic respiratory failure ([facility]) 04/23/2021   Acute renal failure superimposed on stage 4 chronic kidney disease ([place]) 04/19/2021   Acute urinary retention 04/25/2021   Biventricular failure (HCC)    CAD (coronary artery disease)    Class 2 severe obesity due to excess calories with serious comorbidity and body mass index (BMI) of 36.0 to 36.9 in adult [facility]) 04/25/2019   Colorectal  polyps    Delirium 04/25/2021   GIB (gastrointestinal bleeding) 05/11/2021   Gout    Hematochezia    Hemorrhagic shock (HCC)    High anion gap metabolic acidosis 3/54/6568   Hyperkalemia 04/19/2021   Hyperlipidemia LDL goal <70    Hypertension    Hypokalemia 05/16/2021   Hypothyroidism    Kidney stone    Leukocytosis 04/29/2021   Malnutrition of moderate degree 04/21/2021   Multifocal pneumonia 04/19/2021   Pneumonia    Severe sepsis with acute organ dysfunction ([place]) 04/19/2021   SIRS (systemic inflammatory response syndrome) ([place]) 05/16/2021   Testicular hypofunction    Type 2 diabetes mellitus ([place]) 04/26/2019     Medicines Meds ordered this encounter  Medications   morphine (PF) 4 MG/ML injection 4 mg   ondansetron (ZOFRAN) injection 4 mg   propofol (DIPRIVAN) 10 mg/mL bolus/IV push 93.9 mg   morphine (PF) 2 MG/ML injection 2 mg   metoprolol tartrate (LOPRESSOR) injection 5 mg   ketamine 50 mg in normal saline 5 mL (10 mg/mL) syringe   ketamine HCl 50 MG/5ML Dorian Heckle, Aryana G: cabinet override    I have reviewed the patients home medicines and have made adjustments as needed  Problem List / ED Course: Problem List Items Addressed This Visit   None Visit Diagnoses     Dislocation of right hip, initial encounter [facility])    -  Primary                   Final Clinical  Impression(s) / ED Diagnoses Final diagnoses:  Dislocation of right hip, initial encounter [facility])    Rx / DC Orders ED Discharge Orders     None         Carnell Beavers, Barbette Hair, MD 08/18/21 [phone]

## 2021-08-18 NOTE — ED Notes (Signed)
Pt left dept. via EMS transport. Medical necessity form given to EMS. Report given and care endorsed.

## 2021-08-18 NOTE — ED Notes (Signed)
Back from CT

## 2021-08-18 NOTE — ED Notes (Signed)
Patient returned from X-ray 

## 2021-08-18 NOTE — Discharge Instructions (Signed)
Keep knee immobilizer in place until follow-up with your orthopedist.  Continue to observe hip dislocation precautions.

## 2021-08-18 NOTE — ED Notes (Signed)
Called ptar for pt no eta given  °

## 2021-08-18 NOTE — ED Notes (Addendum)
Pt and pt wife aware of EMS transport notified. Reviewed written d/c instructions with pt and family. Pt and pt family verbalizes understanding. Awaiting transport.

## 2021-08-18 NOTE — ED Notes (Signed)
Patient is alert and oriented X4. He is up talking to his wife.

## 2021-08-18 NOTE — ED Triage Notes (Signed)
BIB [facility] following mechanical fall at home while getting out of wheelchair. States R leg gave out, fell onto R side. Recent hip replacement done on that side. 200 mcg fentanyl given PTA. No thinners, no head trauma, no LOC.

## 2021-08-18 NOTE — ED Notes (Signed)
Consent is signed.

## 2021-08-18 NOTE — ED Notes (Signed)
Pt wears continuous 02 at 3L [place]. Wife reports not having an 02 tank to go home. Unit Secritary and charge nurse aware of needed transport via EMS home.

## 2021-08-18 NOTE — ED Notes (Signed)
Verbal order from Dr Dina Rich to give 2mg  morphine IV and 5 of metoprolol  IV

## 2021-08-18 NOTE — ED Notes (Signed)
Pt resting on stretcher. Pt right knee in immobilizer. Pt tolerated movement well. Pt reports hip feeling "stiff but okay".

## 2021-08-19 DIAGNOSIS — Z992 Dependence on renal dialysis: Secondary | ICD-10-CM | POA: Diagnosis not present

## 2021-08-19 DIAGNOSIS — G4733 Obstructive sleep apnea (adult) (pediatric): Secondary | ICD-10-CM | POA: Diagnosis not present

## 2021-08-19 DIAGNOSIS — Z7952 Long term (current) use of systemic steroids: Secondary | ICD-10-CM | POA: Diagnosis not present

## 2021-08-19 DIAGNOSIS — D696 Thrombocytopenia, unspecified: Secondary | ICD-10-CM | POA: Diagnosis not present

## 2021-08-19 DIAGNOSIS — W19XXXD Unspecified fall, subsequent encounter: Secondary | ICD-10-CM | POA: Diagnosis not present

## 2021-08-19 DIAGNOSIS — Z9181 History of falling: Secondary | ICD-10-CM | POA: Diagnosis not present

## 2021-08-19 DIAGNOSIS — D539 Nutritional anemia, unspecified: Secondary | ICD-10-CM | POA: Diagnosis not present

## 2021-08-19 DIAGNOSIS — I482 Chronic atrial fibrillation, unspecified: Secondary | ICD-10-CM | POA: Diagnosis not present

## 2021-08-19 DIAGNOSIS — I2510 Atherosclerotic heart disease of native coronary artery without angina pectoris: Secondary | ICD-10-CM | POA: Diagnosis not present

## 2021-08-19 DIAGNOSIS — S72002D Fracture of unspecified part of neck of left femur, subsequent encounter for closed fracture with routine healing: Secondary | ICD-10-CM | POA: Diagnosis not present

## 2021-08-19 DIAGNOSIS — M1030 Gout due to renal impairment, unspecified site: Secondary | ICD-10-CM | POA: Diagnosis not present

## 2021-08-19 DIAGNOSIS — I5022 Chronic systolic (congestive) heart failure: Secondary | ICD-10-CM | POA: Diagnosis not present

## 2021-08-19 DIAGNOSIS — I132 Hypertensive heart and chronic kidney disease with heart failure and with stage 5 chronic kidney disease, or end stage renal disease: Secondary | ICD-10-CM | POA: Diagnosis not present

## 2021-08-19 DIAGNOSIS — E1122 Type 2 diabetes mellitus with diabetic chronic kidney disease: Secondary | ICD-10-CM | POA: Diagnosis not present

## 2021-08-19 DIAGNOSIS — I509 Heart failure, unspecified: Secondary | ICD-10-CM | POA: Diagnosis not present

## 2021-08-19 DIAGNOSIS — I7782 Antineutrophilic cytoplasmic antibody (ANCA) vasculitis: Secondary | ICD-10-CM | POA: Diagnosis not present

## 2021-08-19 DIAGNOSIS — N186 End stage renal disease: Secondary | ICD-10-CM | POA: Diagnosis not present

## 2021-08-19 DIAGNOSIS — Z79891 Long term (current) use of opiate analgesic: Secondary | ICD-10-CM | POA: Diagnosis not present

## 2021-08-20 ENCOUNTER — Encounter (INDEPENDENT_AMBULATORY_CARE_PROVIDER_SITE_OTHER): Payer: PPO | Admitting: Nurse Practitioner

## 2021-08-20 ENCOUNTER — Encounter (INDEPENDENT_AMBULATORY_CARE_PROVIDER_SITE_OTHER): Payer: PPO

## 2021-08-23 DIAGNOSIS — Z992 Dependence on renal dialysis: Secondary | ICD-10-CM | POA: Diagnosis not present

## 2021-08-23 DIAGNOSIS — N186 End stage renal disease: Secondary | ICD-10-CM | POA: Diagnosis not present

## 2021-08-24 ENCOUNTER — Telehealth (INDEPENDENT_AMBULATORY_CARE_PROVIDER_SITE_OTHER): Payer: Self-pay

## 2021-08-24 NOTE — Telephone Encounter (Signed)
I received a fax from  [facility], wanting the patient scheduled for a permcath exchange. Spoke with the patient and he is scheduled with Dr. Delana Meyer for a permcath exchange on 08/25/21 with a 12:00 pm arrival time to the MM. Pre-procedure instructions were discussed and patient stated he understood.

## 2021-08-25 ENCOUNTER — Encounter: Payer: Self-pay | Admitting: Vascular Surgery

## 2021-08-25 ENCOUNTER — Ambulatory Visit
Admission: RE | Admit: 2021-08-25 | Discharge: 2021-08-25 | Disposition: A | Discharge status: Home / Self Care | Payer: PPO | Attending: Vascular Surgery | Admitting: Vascular Surgery

## 2021-08-25 ENCOUNTER — Other Ambulatory Visit: Payer: Self-pay

## 2021-08-25 ENCOUNTER — Encounter
Admission: RE | Disposition: A | Discharge status: Home / Self Care | Payer: Self-pay | Source: Home / Self Care | Attending: Vascular Surgery

## 2021-08-25 DIAGNOSIS — I132 Hypertensive heart and chronic kidney disease with heart failure and with stage 5 chronic kidney disease, or end stage renal disease: Secondary | ICD-10-CM | POA: Diagnosis not present

## 2021-08-25 DIAGNOSIS — Y841 Kidney dialysis as the cause of abnormal reaction of the patient, or of later complication, without mention of misadventure at the time of the procedure: Secondary | ICD-10-CM | POA: Insufficient documentation

## 2021-08-25 DIAGNOSIS — W19XXXD Unspecified fall, subsequent encounter: Secondary | ICD-10-CM | POA: Diagnosis not present

## 2021-08-25 DIAGNOSIS — D539 Nutritional anemia, unspecified: Secondary | ICD-10-CM | POA: Diagnosis not present

## 2021-08-25 DIAGNOSIS — I7782 Antineutrophilic cytoplasmic antibody (ANCA) vasculitis: Secondary | ICD-10-CM | POA: Diagnosis not present

## 2021-08-25 DIAGNOSIS — Z992 Dependence on renal dialysis: Secondary | ICD-10-CM | POA: Insufficient documentation

## 2021-08-25 DIAGNOSIS — I509 Heart failure, unspecified: Secondary | ICD-10-CM | POA: Diagnosis not present

## 2021-08-25 DIAGNOSIS — N186 End stage renal disease: Secondary | ICD-10-CM | POA: Diagnosis not present

## 2021-08-25 DIAGNOSIS — G4733 Obstructive sleep apnea (adult) (pediatric): Secondary | ICD-10-CM | POA: Diagnosis not present

## 2021-08-25 DIAGNOSIS — T8249XA Other complication of vascular dialysis catheter, initial encounter: Secondary | ICD-10-CM | POA: Insufficient documentation

## 2021-08-25 DIAGNOSIS — E1122 Type 2 diabetes mellitus with diabetic chronic kidney disease: Secondary | ICD-10-CM | POA: Insufficient documentation

## 2021-08-25 DIAGNOSIS — I120 Hypertensive chronic kidney disease with stage 5 chronic kidney disease or end stage renal disease: Secondary | ICD-10-CM | POA: Diagnosis not present

## 2021-08-25 DIAGNOSIS — I482 Chronic atrial fibrillation, unspecified: Secondary | ICD-10-CM | POA: Diagnosis not present

## 2021-08-25 DIAGNOSIS — Z87891 Personal history of nicotine dependence: Secondary | ICD-10-CM | POA: Diagnosis not present

## 2021-08-25 DIAGNOSIS — I2510 Atherosclerotic heart disease of native coronary artery without angina pectoris: Secondary | ICD-10-CM | POA: Insufficient documentation

## 2021-08-25 DIAGNOSIS — M1030 Gout due to renal impairment, unspecified site: Secondary | ICD-10-CM | POA: Diagnosis not present

## 2021-08-25 DIAGNOSIS — Z79891 Long term (current) use of opiate analgesic: Secondary | ICD-10-CM | POA: Diagnosis not present

## 2021-08-25 DIAGNOSIS — S72002D Fracture of unspecified part of neck of left femur, subsequent encounter for closed fracture with routine healing: Secondary | ICD-10-CM | POA: Diagnosis not present

## 2021-08-25 DIAGNOSIS — Z7952 Long term (current) use of systemic steroids: Secondary | ICD-10-CM | POA: Diagnosis not present

## 2021-08-25 DIAGNOSIS — D696 Thrombocytopenia, unspecified: Secondary | ICD-10-CM | POA: Diagnosis not present

## 2021-08-25 DIAGNOSIS — Z9181 History of falling: Secondary | ICD-10-CM | POA: Diagnosis not present

## 2021-08-25 HISTORY — PX: DIALYSIS/PERMA CATHETER INSERTION: CATH118288

## 2021-08-25 SURGERY — DIALYSIS/PERMA CATHETER INSERTION
Anesthesia: Moderate Sedation

## 2021-08-25 MED ORDER — FENTANYL CITRATE (PF) 100 MCG/2ML IJ SOLN
INTRAMUSCULAR | Status: AC
  Filled 2021-08-25: qty 2

## 2021-08-25 MED ORDER — CEFAZOLIN SODIUM-DEXTROSE 1-4 GM/50ML-% IV SOLN: 1.0000 g | INTRAVENOUS | Status: DC

## 2021-08-25 MED ORDER — HYDROMORPHONE HCL 1 MG/ML IJ SOLN: 1.0000 mg | Freq: Once | INTRAMUSCULAR | Status: DC | PRN

## 2021-08-25 MED ORDER — METHYLPREDNISOLONE SODIUM SUCC 125 MG IJ SOLR: 125.0000 mg | Freq: Once | INTRAMUSCULAR | Status: DC | PRN

## 2021-08-25 MED ORDER — FENTANYL CITRATE (PF) 100 MCG/2ML IJ SOLN
INTRAMUSCULAR | Status: DC | PRN
  Administered 2021-08-25: 50 ug via INTRAVENOUS

## 2021-08-25 MED ORDER — DIPHENHYDRAMINE HCL 50 MG/ML IJ SOLN: 50.0000 mg | Freq: Once | INTRAMUSCULAR | Status: DC | PRN

## 2021-08-25 MED ORDER — CEFAZOLIN SODIUM-DEXTROSE 1-4 GM/50ML-% IV SOLN
INTRAVENOUS | Status: DC | PRN
  Administered 2021-08-25: 1 g via INTRAVENOUS

## 2021-08-25 MED ORDER — CEFAZOLIN SODIUM-DEXTROSE 1-4 GM/50ML-% IV SOLN
INTRAVENOUS | Status: DC
Start: 2021-08-25 — End: 2021-08-25
  Filled 2021-08-25: qty 50

## 2021-08-25 MED ORDER — FAMOTIDINE 20 MG PO TABS: 40.0000 mg | ORAL_TABLET | Freq: Once | ORAL | Status: DC | PRN

## 2021-08-25 MED ORDER — MIDAZOLAM HCL 2 MG/2ML IJ SOLN
INTRAMUSCULAR | Status: DC | PRN
  Administered 2021-08-25: 2 mg via INTRAVENOUS

## 2021-08-25 MED ORDER — MIDAZOLAM HCL 2 MG/ML PO SYRP
8.0000 mg | ORAL_SOLUTION | Freq: Once | ORAL | Status: DC | PRN
Start: 2021-08-25 — End: 2021-08-25

## 2021-08-25 MED ORDER — ONDANSETRON HCL 4 MG/2ML IJ SOLN: 4.0000 mg | Freq: Four times a day (QID) | INTRAMUSCULAR | Status: DC | PRN

## 2021-08-25 MED ORDER — MIDAZOLAM HCL 2 MG/2ML IJ SOLN
INTRAMUSCULAR | Status: AC
  Filled 2021-08-25: qty 2

## 2021-08-25 MED ORDER — HEPARIN SODIUM (PORCINE) 1000 UNIT/ML IJ SOLN
INTRAMUSCULAR | Status: DC
Start: 2021-08-25 — End: 2021-08-25
  Filled 2021-08-25: qty 10

## 2021-08-25 MED ORDER — SODIUM CHLORIDE 0.9 % IV SOLN: INTRAVENOUS | Status: DC

## 2021-08-25 SURGICAL SUPPLY — 7 items
CATH PALIN MAXID VT KIT 19CM (CATHETERS) ×1 IMPLANT
GUIDEWIRE SUPER STIFF .035X180 (WIRE) ×1 IMPLANT
PACK ANGIOGRAPHY (CUSTOM PROCEDURE TRAY) ×1 IMPLANT
SUT MNCRL 4-0 (SUTURE) ×2
SUT MNCRL 4-0 27XMFL (SUTURE) ×1
SUTURE MNCRL 4-0 27XMF (SUTURE) IMPLANT
TOWEL OR 17X26 4PK STRL BLUE (TOWEL DISPOSABLE) ×1 IMPLANT

## 2021-08-25 NOTE — Final Progress Note (Signed)
MRN : [id number]  Blake West is a 71 y.o. (03-31-1950) male who presents with chief complaint of check access.  History of Present Illness:   Patient is sent over by his dialysis center urgently for nonfunction of his tunneled catheter.  His last full dialysis was Saturday.  Patient denies fever or chills while on dialysis.  No pain at the catheter site.  No drainage.  Current Meds  Medication Sig   acetaminophen (TYLENOL) 325 MG tablet Take 650 mg by mouth every 6 (six) hours as needed for moderate pain.   acidophilus (RISAQUAD) CAPS capsule Take 2 capsules by mouth 3 (three) times daily.   Ascorbic Acid (VITAMIN C) 100 MG tablet Take 100 mg by mouth daily.   atorvastatin (LIPITOR) 80 MG tablet Take 1 tablet (80 mg total) by mouth daily.   B Complex Vitamins (B COMPLEX PO) Take 1 capsule by mouth daily.   calcium-vitamin D (OSCAL WITH D) 500-5 MG-MCG tablet Take 1 tablet by mouth.   Cholecalciferol 25 MCG (1000 UT) tablet Take 1 tablet (1,000 Units total) by mouth daily.   ferrous sulfate 325 (65 FE) MG EC tablet Take 325 mg by mouth daily.   magnesium 30 MG tablet Take 30 mg by mouth 2 (two) times daily.   Metoprolol Tartrate 37.5 MG TABS Take 37.5 mg by mouth 2 (two) times daily.   omega-3 acid ethyl esters (LOVAZA) 1 g capsule Take 1 g by mouth 2 (two) times daily.   polyethylene glycol (MIRALAX / GLYCOLAX) 17 g packet Take 17 g by mouth daily as needed for mild constipation.   predniSONE (DELTASONE) 20 MG tablet Take 1 tablet (20 mg total) by mouth daily with breakfast.   Pyridoxine HCl (VITAMIN B6 PO) Take 1 capsule by mouth daily.   QUEtiapine (SEROQUEL) 100 MG tablet Take 1 tablet (100 mg total) by mouth at bedtime.   sevelamer carbonate (RENVELA) 800 MG tablet Take 2 tablets (1,600 mg total) by mouth 3 (three) times daily with meals.   Sulfamethoxazole-Trimethoprim (BACTRIM PO) Take by mouth once.   thiamine 250 MG tablet Take 500 mg by mouth  daily.   VITAMIN E PO Take 1 Capful by mouth daily.    Past Medical History:  Diagnosis Date   Abnormal EKG    Acute blood loss anemia (ABLA) 05/16/2021   Acute congestive heart failure ([place]) 04/25/2019   Acute hypoxemic respiratory failure ([place]) 04/23/2021   Acute renal failure superimposed on stage 4 chronic kidney disease ([place]) 04/19/2021   Acute urinary retention 04/25/2021   Biventricular failure (HCC)    CAD (coronary artery disease)    Class 2 severe obesity due to excess calories with serious comorbidity and body mass index (BMI) of 36.0 to 36.9 in adult [facility]) 04/25/2019   Colorectal polyps    Delirium 04/25/2021   GIB (gastrointestinal bleeding) 05/11/2021   Gout    Hematochezia    Hemorrhagic shock (HCC)    High anion gap metabolic acidosis 3/00/7622   Hyperkalemia 04/19/2021   Hyperlipidemia LDL goal <70    Hypertension    Hypokalemia 05/16/2021   Hypothyroidism    Kidney stone    Leukocytosis 04/29/2021   Malnutrition of moderate degree 04/21/2021   Multifocal pneumonia 04/19/2021   Pneumonia    Severe sepsis with acute organ dysfunction ([place]) 04/19/2021   SIRS (systemic inflammatory response syndrome) (  [place]) 05/16/2021   Testicular hypofunction    Type 2 diabetes mellitus ([place]) 04/26/2019    Past Surgical History:  Procedure Laterality Date   CARDIAC CATHETERIZATION     COLONOSCOPY N/A 05/15/2021   Procedure: COLONOSCOPY;  Surgeon: Lin Landsman, MD;  Location: [facility] ENDOSCOPY;  Service: Gastroenterology;  Laterality: N/A;   DIALYSIS/PERMA CATHETER INSERTION N/A 04/22/2021   Procedure: DIALYSIS/PERMA CATHETER INSERTION;  Surgeon: Algernon Huxley, MD;  Location: [place] CV LAB;  Service: Cardiovascular;  Laterality: N/A;   DIALYSIS/PERMA CATHETER INSERTION N/A 06/07/2021   Procedure: DIALYSIS/PERMA CATHETER INSERTION;  Surgeon: Algernon Huxley, MD;  Location: [place] [facility] LAB;  Service: Cardiovascular;  Laterality: N/A;   DIALYSIS/PERMA CATHETER INSERTION N/A 07/21/2021    Procedure: DIALYSIS/PERMA CATHETER INSERTION;  Surgeon: Algernon Huxley, MD;  Location: [place] [facility] LAB;  Service: Cardiovascular;  Laterality: N/A;   ESOPHAGOGASTRODUODENOSCOPY (EGD) WITH PROPOFOL N/A 05/13/2021   Procedure: ESOPHAGOGASTRODUODENOSCOPY (EGD) WITH PROPOFOL;  Surgeon: Jonathon Bellows, MD;  Location: [facility] ENDOSCOPY;  Service: Gastroenterology;  Laterality: N/A;   FOOT SURGERY     HIP ARTHROPLASTY Right 07/05/2021   Procedure: ARTHROPLASTY BIPOLAR HIP (HEMIARTHROPLASTY);  Surgeon: Willaim Sheng, MD;  Location: [place];  Service: Orthopedics;  Laterality: Right;   RIGHT/LEFT HEART CATH AND CORONARY ANGIOGRAPHY N/A 04/30/2019   Procedure: RIGHT/LEFT HEART CATH AND CORONARY ANGIOGRAPHY;  Surgeon: Troy Sine, MD;  Location: [place] [facility] LAB;  Service: Cardiovascular;  Laterality: N/A;    Social History Social History   Tobacco Use   Smoking status: Former    Packs/day: 1.00    Years: 30.00    Total pack years: 30.00    Types: Cigarettes    Quit date: 01/24/2002    Years since quitting: 19.5   Smokeless tobacco: Never  Vaping Use   Vaping Use: Never used  Substance Use Topics   Alcohol use: No   Drug use: No    Family History Family History  Problem Relation Age of Onset   Heart attack Mother 19   Pulmonary fibrosis Father    Congestive Heart Failure Brother    Heart attack Brother    Heart attack Brother    Early death Sister    Kidney disease Paternal Grandfather    Diabetes Sister     Allergies  Allergen Reactions   Codeine Other (See Comments)    Joints hurt  Other reaction(s): Other (See Comments) Joints hurt  Other reaction(s): Other (See Comments) Joints hurt  Joints hurt     Citalopram Other (See Comments)    agitation   Gabapentin Other (See Comments)    agitation   Lyrica [Pregabalin] Other (See Comments)    agitation     REVIEW OF SYSTEMS (Negative unless checked)  Constitutional: [] Weight loss  [] Fever  [] Chills Cardiac: [] Chest  pain   [] Chest pressure   [] Palpitations   [] Shortness of breath when laying flat   [] Shortness of breath with exertion. Vascular:  [] Pain in legs with walking   [] Pain in legs at rest  [] History of DVT   [] Phlebitis   [] Swelling in legs   [] Varicose veins   [] Non-healing ulcers Pulmonary:   [] Uses home oxygen   [] Productive cough   [] Hemoptysis   [] Wheeze  [] COPD   [] Asthma Neurologic:  [] Dizziness   [] Seizures   [] History of stroke   [] History of TIA  [] Aphasia   [] Vissual changes   [] Weakness or numbness in arm   [] Weakness or numbness in leg Musculoskeletal:   []   Joint swelling   [] Joint pain   [] Low back pain Hematologic:  [] Easy bruising  [] Easy bleeding   [] Hypercoagulable state   [] Anemic Gastrointestinal:  [] Diarrhea   [] Vomiting  [] Gastroesophageal reflux/heartburn   [] Difficulty swallowing. Genitourinary:  [x] Chronic kidney disease   [] Difficult urination  [] Frequent urination   [] Blood in urine Skin:  [] Rashes   [] Ulcers  Psychological:  [] History of anxiety   []  History of major depression.  Physical Examination  Vitals:   08/25/21 1338 08/25/21 1343 08/25/21 1348 08/25/21 1353  BP: (!) 121/100 120/85 (!) 117/102 118/84  Pulse: (!) 101 (!) 110 (!) 117 (!) 116  Resp: 17 11 16 20   Temp:      TempSrc:      SpO2: 100% 100% 97% 96%  Weight:      Height:       Body mass index is 28.87 kg/m. Gen: WD/WN, NAD Head: [place]/AT, No temporalis wasting.  Ear/Nose/Throat: Hearing grossly intact, nares w/o erythema or drainage Eyes: PER, EOMI, sclera nonicteric.  Neck: Supple, no gross masses or lesions.  No JVD.  Pulmonary:  Good air movement, no audible wheezing, no use of accessory muscles.  Cardiac: RRR, precordium non-hyperdynamic. Vascular:   Right IJ tunnel catheter clean dry and intact Vessel Right Left  Radial Palpable Palpable  Brachial Palpable Palpable  Gastrointestinal: soft, non-distended. No guarding/no peritoneal signs.  Musculoskeletal: M/S 5/5 throughout.  No  deformity.  Neurologic: CN 2-12 intact. Pain and light touch intact in extremities.  Symmetrical.  Speech is fluent. Motor exam as listed above. Psychiatric: Judgment intact, Mood & affect appropriate for pt's clinical situation. Dermatologic: No rashes or ulcers noted.  No changes consistent with cellulitis.   CBC Lab Results  Component Value Date   WBC 12.7 (H) 08/18/2021   HGB 8.3 (L) 08/18/2021   HCT 26.9 (L) 08/18/2021   MCV 98.5 08/18/2021   PLT 205 08/18/2021    BMET    Component Value Date/Time   NA 142 08/18/2021 0208   NA 138 06/26/2019 1416   K 4.7 08/18/2021 0208   CL 104 08/18/2021 0208   CO2 27 08/18/2021 0208   GLUCOSE 126 (H) 08/18/2021 0208   BUN 66 (H) 08/18/2021 0208   BUN 16 06/26/2019 1416   CREATININE 5.70 (H) 08/18/2021 0208   CALCIUM 8.1 (L) 08/18/2021 0208   GFRNONAA 10 (L) 08/18/2021 0208   GFRAA >60 07/05/2019 0952   Estimated Creatinine Clearance: 13.9 mL/min (A) (by C-G formula based on SCr of 5.7 mg/dL (H)).  COAG Lab Results  Component Value Date   INR 1.1 07/03/2021   INR 1.0 06/04/2021   INR 1.3 (H) 05/13/2021    Radiology PERIPHERAL VASCULAR CATHETERIZATION  Result Date: 08/25/2021 See surgical note for result.  CT Hip Right Wo Contrast  Result Date: 08/18/2021 CLINICAL DATA:  Hip dislocation.  Postreduction evaluation. EXAM: CT OF THE RIGHT HIP WITHOUT CONTRAST TECHNIQUE: Multidetector CT imaging of the right hip was performed according to the standard protocol. Multiplanar CT image reconstructions were also generated. RADIATION DOSE REDUCTION: This exam was performed according to the departmental dose-optimization program which includes automated exposure control, adjustment of the mA and/or kV according to patient size and/or use of iterative reconstruction technique. COMPARISON:  Pelvis and right hip radiographs 08/18/2021 (multiple studies), right hip radiographs 07/05/2021, pelvis and right hip radiographs 07/03/2021 FINDINGS:  Bones/Joint/Cartilage Postsurgical changes are again seen of total right hip arthroplasty. Within limitations of metallic streak artifact, no perihardware lucency is seen to indicate hardware  failure or loosening. No definite acute fracture is seen. The densities described on 08/18/2021 radiograph at 2:28 a.m. just lateral to the right acetabulum appear to represent long-term stable atherosclerotic calcifications within the right femoral artery rather than cortical bone fragments (axial series 4, image 32, sagittal series 7, image 78, coronal series 6, image 43). Ligaments Suboptimally assessed by CT. Muscles and Tendons There is heterogeneous intermediate density that likely represents hematoma seen within the right buttock soft tissues in between the right gluteus medius and gluteus maximus muscles and in the region of the trochanteric bursa measuring up to approximately 6.1 x 5.7 x 11 cm (transverse by AP by craniocaudal). Soft tissues Moderate atherosclerotic calcifications are seen within the right femoral artery. Small fat containing right inguinal hernia. IMPRESSION: 1. Status post total right hip arthroplasty which appears in appropriate alignment with the right acetabular cup appropriately within the right acetabulum following prior dislocation of the entire right acetabular cup and femoral stem superiorly on the initial 08/18/2021 radiograph. 2. No acute fracture is seen. The densities overlying the right acetabulum when the acetabular cup was dislocated on the initial 08/18/2021 radiographs appears to represent atherosclerotic calcifications within the right femoral artery rather than cortical bone fragments. 3. Posterolateral right buttock soft tissue hematoma measuring up to 11 cm. Electronically Signed   By: Yvonne Kendall M.D.   On: 08/18/2021 08:23   DG Hip Unilat W or Wo Pelvis 2-3 Views Right  Result Date: 08/18/2021 CLINICAL DATA:  71 year old male, reduced right prosthetic hip dislocation.  EXAM: DG HIP (WITH OR WITHOUT PELVIS) 2-3V RIGHT COMPARISON:  0228 hours today. FINDINGS: AP portable right hip series 0512 hours. Right bipolar hip arthroplasty dislocation appears reduced from earlier today. Acetabular component alignment now appears similar to 07/05/2021. The entire right femoral stem is not included. No acute osseous abnormality identified. Negative visible bowel gas. IMPRESSION: Relocation of the right hip arthroplasty since 0228 hours today. No acute fracture identified. Electronically Signed   By: Genevie Ann M.D.   On: 08/18/2021 05:56   DG Hip Unilat W or Wo Pelvis 2-3 Views Right  Result Date: 08/18/2021 CLINICAL DATA:  Fall injury. EXAM: DG HIP (WITH OR WITHOUT PELVIS) 2-3V RIGHT; PORTABLE CHEST - 1 VIEW COMPARISON:  Portable chest 07/03/2021, PA Lat 06/04/2021, AP pelvis and right hip 07/05/2021. FINDINGS: Chest: There is a right IJ dialysis catheter terminating in the upper right atrium. There is mild cardiomegaly with normal caliber central vessels. Scarring changes in the lower lung fields are again noted. There is a low inspiration with no new opacity concerning for acute pneumonia. No pleural effusion is seen. Stable mediastinal configuration with calcification in the aortic arch. No acute osseous abnormality is seen. There is osteopenia and degenerative change of the spine. AP pelvis with right hip: The entire right hip arthroplasty including the acetabular cup prosthesis has dislocated superiorly compared to the prior study. There is calcific debris lateral to the lower native acetabular bone which could be due to acetabular chip fractures, although some if not all of this was seen on the prior study. There is no further evidence of fractures. No pelvic fracture or diastasis is seen. IMPRESSION: 1. Low-inspiration chest study with chronic change in the bases. No convincing acute process. 2. Superior dislocation of the entire right hip arthroplasty including the acetabular cup  prosthesis. 3. Calcific debris superimposing lateral to the lower native right acetabulum. Some if not all of this was present on the prior study although 1 or more  new chip fractures off of the acetabular bone could be present within this. There is no further evidence of fractures. Electronically Signed   By: Telford Nab M.D.   On: 08/18/2021 03:10   DG Chest Portable 1 View  Result Date: 08/18/2021 CLINICAL DATA:  Fall injury. EXAM: DG HIP (WITH OR WITHOUT PELVIS) 2-3V RIGHT; PORTABLE CHEST - 1 VIEW COMPARISON:  Portable chest 07/03/2021, PA Lat 06/04/2021, AP pelvis and right hip 07/05/2021. FINDINGS: Chest: There is a right IJ dialysis catheter terminating in the upper right atrium. There is mild cardiomegaly with normal caliber central vessels. Scarring changes in the lower lung fields are again noted. There is a low inspiration with no new opacity concerning for acute pneumonia. No pleural effusion is seen. Stable mediastinal configuration with calcification in the aortic arch. No acute osseous abnormality is seen. There is osteopenia and degenerative change of the spine. AP pelvis with right hip: The entire right hip arthroplasty including the acetabular cup prosthesis has dislocated superiorly compared to the prior study. There is calcific debris lateral to the lower native acetabular bone which could be due to acetabular chip fractures, although some if not all of this was seen on the prior study. There is no further evidence of fractures. No pelvic fracture or diastasis is seen. IMPRESSION: 1. Low-inspiration chest study with chronic change in the bases. No convincing acute process. 2. Superior dislocation of the entire right hip arthroplasty including the acetabular cup prosthesis. 3. Calcific debris superimposing lateral to the lower native right acetabulum. Some if not all of this was present on the prior study although 1 or more new chip fractures off of the acetabular bone could be present  within this. There is no further evidence of fractures. Electronically Signed   By: Telford Nab M.D.   On: 08/18/2021 03:10     Assessment/Plan 1. End stage renal disease ([place]) - PERIPHERAL VASCULAR CATHETERIZATION; Standing - PERIPHERAL VASCULAR CATHETERIZATION Recommend:  The patient is experiencing increasing problems with their dialysis access.  Patient should have a tunneled catheter replacement.  The intention for intervention is to restore appropriate flow.  As well as improve the quality of dialysis therapy.  The risks, benefits and alternative therapies were reviewed in detail with the patient.  All questions were answered.  The patient agrees to proceed with exchange of his tunneled catheter.    The patient will follow up with me in the office after the procedure.  Hypertension: Continue antihypertensive medications as already ordered, these medications have been reviewed and there are no changes at this time.   Diabetes: Continue hypoglycemic medications as already ordered, these medications have been reviewed and there are no changes at this time.  Hgb A1C to be monitored as already arranged by primary service   Coronary artery disease: Continue cardiac and antihypertensive medications as already ordered and reviewed, no changes at this time.  Continue statin as ordered and reviewed, no changes at this time  Nitrates PRN for chest pain       Hortencia Pilar, MD  08/25/2021 1:59 PM

## 2021-08-25 NOTE — Progress Notes (Signed)
Expand AllCollapse All                                                                                                                                                                                                                                                                                                                                                                                                                                                                                                                                                                                                 MRN : [id number]   Blake West is a 71 y.o. (11-03-1950) male who presents with chief complaint of check  access.   History of Present Illness:    Patient is sent over by his dialysis center urgently for nonfunction of his tunneled catheter.  His last full dialysis was Saturday.  Patient denies fever or chills while on dialysis.  No pain at the catheter site.  No drainage.   Active Medications      Current Meds  Medication Sig   acetaminophen (TYLENOL) 325 MG tablet Take 650 mg by mouth every 6 (six) hours as needed for moderate pain.   acidophilus (RISAQUAD) CAPS capsule Take 2 capsules by mouth 3 (three) times daily.   Ascorbic Acid (VITAMIN C) 100 MG tablet Take 100 mg by mouth daily.   atorvastatin (LIPITOR) 80 MG tablet Take 1 tablet (80 mg total) by mouth daily.   B Complex Vitamins (B COMPLEX PO) Take 1 capsule by mouth daily.   calcium-vitamin D (OSCAL WITH D) 500-5 MG-MCG tablet Take 1 tablet by mouth.   Cholecalciferol 25 MCG (1000 UT) tablet Take 1 tablet (1,000 Units total) by mouth daily.   ferrous sulfate 325 (65 FE) MG EC tablet Take 325 mg by mouth daily.   magnesium 30 MG tablet Take 30 mg by mouth 2 (two) times daily.   Metoprolol Tartrate 37.5 MG TABS Take  37.5 mg by mouth 2 (two) times daily.   omega-3 acid ethyl esters (LOVAZA) 1 g capsule Take 1 g by mouth 2 (two) times daily.   polyethylene glycol (MIRALAX / GLYCOLAX) 17 g packet Take 17 g by mouth daily as needed for mild constipation.   predniSONE (DELTASONE) 20 MG tablet Take 1 tablet (20 mg total) by mouth daily with breakfast.   Pyridoxine HCl (VITAMIN B6 PO) Take 1 capsule by mouth daily.   QUEtiapine (SEROQUEL) 100 MG tablet Take 1 tablet (100 mg total) by mouth at bedtime.   sevelamer carbonate (RENVELA) 800 MG tablet Take 2 tablets (1,600 mg total) by mouth 3 (three) times daily with meals.   Sulfamethoxazole-Trimethoprim (BACTRIM PO) Take by mouth once.   thiamine 250 MG tablet Take 500 mg by mouth daily.   VITAMIN E PO Take 1 Capful by mouth daily.            Past Medical History:  Diagnosis Date   Abnormal EKG     Acute blood loss anemia (ABLA) 05/16/2021   Acute congestive heart failure ([place]) 04/25/2019   Acute hypoxemic respiratory failure ([place]) 04/23/2021   Acute renal failure superimposed on stage 4 chronic kidney disease ([place]) 04/19/2021   Acute urinary retention 04/25/2021   Biventricular failure (HCC)     CAD (coronary artery disease)     Class 2 severe obesity due to excess calories with serious comorbidity and body mass index (BMI) of 36.0 to 36.9 in adult [facility]) 04/25/2019   Colorectal polyps     Delirium 04/25/2021   GIB (gastrointestinal bleeding) 05/11/2021   Gout     Hematochezia     Hemorrhagic shock (HCC)     High anion gap metabolic acidosis 5/36/4680   Hyperkalemia 04/19/2021   Hyperlipidemia LDL goal <70     Hypertension     Hypokalemia 05/16/2021   Hypothyroidism     Kidney stone     Leukocytosis 04/29/2021   Malnutrition of moderate degree 04/21/2021   Multifocal pneumonia 04/19/2021   Pneumonia     Severe sepsis with acute organ dysfunction ([place]) 04/19/2021   SIRS (systemic inflammatory response syndrome) ([place]) 05/16/2021   Testicular hypofunction     Type  2  diabetes mellitus ([place]) 04/26/2019           Past Surgical History:  Procedure Laterality Date   CARDIAC CATHETERIZATION       COLONOSCOPY N/A 05/15/2021    Procedure: COLONOSCOPY;  Surgeon: Lin Landsman, MD;  Location: [facility] ENDOSCOPY;  Service: Gastroenterology;  Laterality: N/A;   DIALYSIS/PERMA CATHETER INSERTION N/A 04/22/2021    Procedure: DIALYSIS/PERMA CATHETER INSERTION;  Surgeon: Algernon Huxley, MD;  Location: [place] CV LAB;  Service: Cardiovascular;  Laterality: N/A;   DIALYSIS/PERMA CATHETER INSERTION N/A 06/07/2021    Procedure: DIALYSIS/PERMA CATHETER INSERTION;  Surgeon: Algernon Huxley, MD;  Location: [place] [facility];  Service: Cardiovascular;  Laterality: N/A;   DIALYSIS/PERMA CATHETER INSERTION N/A 07/21/2021    Procedure: DIALYSIS/PERMA CATHETER INSERTION;  Surgeon: Algernon Huxley, MD;  Location: [place] [facility];  Service: Cardiovascular;  Laterality: N/A;   ESOPHAGOGASTRODUODENOSCOPY (EGD) WITH PROPOFOL N/A 05/13/2021    Procedure: ESOPHAGOGASTRODUODENOSCOPY (EGD) WITH PROPOFOL;  Surgeon: Jonathon Bellows, MD;  Location: [facility] ENDOSCOPY;  Service: Gastroenterology;  Laterality: N/A;   FOOT SURGERY       HIP ARTHROPLASTY Right 07/05/2021    Procedure: ARTHROPLASTY BIPOLAR HIP (HEMIARTHROPLASTY);  Surgeon: Willaim Sheng, MD;  Location: [place];  Service: Orthopedics;  Laterality: Right;   RIGHT/LEFT HEART CATH AND CORONARY ANGIOGRAPHY N/A 04/30/2019    Procedure: RIGHT/LEFT HEART CATH AND CORONARY ANGIOGRAPHY;  Surgeon: Troy Sine, MD;  Location: [place] [facility];  Service: Cardiovascular;  Laterality: N/A;      Social History Social History         Tobacco Use   Smoking status: Former      Packs/day: 1.00      Years: 30.00      Total pack years: 30.00      Types: Cigarettes      Quit date: 01/24/2002      Years since quitting: 19.5   Smokeless tobacco: Never  Vaping Use   Vaping Use: Never used  Substance Use Topics   Alcohol use: No   Drug use:  No      Family History      Family History  Problem Relation Age of Onset   Heart attack Mother 61   Pulmonary fibrosis Father     Congestive Heart Failure Brother     Heart attack Brother     Heart attack Brother     Early death Sister     Kidney disease Paternal Grandfather     Diabetes Sister             Allergies  Allergen Reactions   Codeine Other (See Comments)      Joints hurt  Other reaction(s): Other (See Comments) Joints hurt  Other reaction(s): Other (See Comments) Joints hurt  Joints hurt      Citalopram Other (See Comments)      agitation   Gabapentin Other (See Comments)      agitation   Lyrica [Pregabalin] Other (See Comments)      agitation        REVIEW OF SYSTEMS (Negative unless checked)   Constitutional: [] Weight loss  [] Fever  [] Chills Cardiac: [] Chest pain   [] Chest pressure   [] Palpitations   [] Shortness of breath when laying flat   [] Shortness of breath with exertion. Vascular:  [] Pain in legs with walking   [] Pain in legs at rest  [] History of DVT   [] Phlebitis   [] Swelling in legs   [] Varicose veins   [] Non-healing  ulcers Pulmonary:   [] Uses home oxygen   [] Productive cough   [] Hemoptysis   [] Wheeze  [] COPD   [] Asthma Neurologic:  [] Dizziness   [] Seizures   [] History of stroke   [] History of TIA  [] Aphasia   [] Vissual changes   [] Weakness or numbness in arm   [] Weakness or numbness in leg Musculoskeletal:   [] Joint swelling   [] Joint pain   [] Low back pain Hematologic:  [] Easy bruising  [] Easy bleeding   [] Hypercoagulable state   [] Anemic Gastrointestinal:  [] Diarrhea   [] Vomiting  [] Gastroesophageal reflux/heartburn   [] Difficulty swallowing. Genitourinary:  [x] Chronic kidney disease   [] Difficult urination  [] Frequent urination   [] Blood in urine Skin:  [] Rashes   [] Ulcers  Psychological:  [] History of anxiety   []  History of major depression.   Physical Examination         Vitals:    08/25/21 1338 08/25/21 1343 08/25/21 1348 08/25/21  1353  BP: (!) 121/100 120/85 (!) 117/102 118/84  Pulse: (!) 101 (!) 110 (!) 117 (!) 116  Resp: 17 11 16 20   Temp:          TempSrc:          SpO2: 100% 100% 97% 96%  Weight:          Height:            Body mass index is 28.87 kg/m. Gen: WD/WN, NAD Head: [place]/AT, No temporalis wasting.  Ear/Nose/Throat: Hearing grossly intact, nares w/o erythema or drainage Eyes: PER, EOMI, sclera nonicteric.  Neck: Supple, no gross masses or lesions.  No JVD.  Pulmonary:  Good air movement, no audible wheezing, no use of accessory muscles.  Cardiac: RRR, precordium non-hyperdynamic. Vascular:   Right IJ tunnel catheter clean dry and intact Vessel Right Left  Radial Palpable Palpable  Brachial Palpable Palpable  Gastrointestinal: soft, non-distended. No guarding/no peritoneal signs.  Musculoskeletal: M/S 5/5 throughout.  No deformity.  Neurologic: CN 2-12 intact. Pain and light touch intact in extremities.  Symmetrical.  Speech is fluent. Motor exam as listed above. Psychiatric: Judgment intact, Mood & affect appropriate for pt's clinical situation. Dermatologic: No rashes or ulcers noted.  No changes consistent with cellulitis.     CBC Recent Labs       Lab Results  Component Value Date    WBC 12.7 (H) 08/18/2021    HGB 8.3 (L) 08/18/2021    HCT 26.9 (L) 08/18/2021    MCV 98.5 08/18/2021    PLT 205 08/18/2021        BMET Labs (Brief)          Component Value Date/Time    NA 142 08/18/2021 0208    NA 138 06/26/2019 1416    K 4.7 08/18/2021 0208    CL 104 08/18/2021 0208    CO2 27 08/18/2021 0208    GLUCOSE 126 (H) 08/18/2021 0208    BUN 66 (H) 08/18/2021 0208    BUN 16 06/26/2019 1416    CREATININE 5.70 (H) 08/18/2021 0208    CALCIUM 8.1 (L) 08/18/2021 0208    GFRNONAA 10 (L) 08/18/2021 0208    GFRAA >60 07/05/2019 0952      Estimated Creatinine Clearance: 13.9 mL/min (A) (by C-G formula based on SCr of 5.7 mg/dL (H)).   COAG Recent Labs       Lab Results  Component  Value Date    INR 1.1 07/03/2021    INR 1.0 06/04/2021    INR 1.3 (H) 05/13/2021  Radiology  Imaging Results  PERIPHERAL VASCULAR CATHETERIZATION   Result Date: 08/25/2021 See surgical note for result.   CT Hip Right Wo Contrast   Result Date: 08/18/2021 CLINICAL DATA:  Hip dislocation.  Postreduction evaluation. EXAM: CT OF THE RIGHT HIP WITHOUT CONTRAST TECHNIQUE: Multidetector CT imaging of the right hip was performed according to the standard protocol. Multiplanar CT image reconstructions were also generated. RADIATION DOSE REDUCTION: This exam was performed according to the departmental dose-optimization program which includes automated exposure control, adjustment of the mA and/or kV according to patient size and/or use of iterative reconstruction technique. COMPARISON:  Pelvis and right hip radiographs 08/18/2021 (multiple studies), right hip radiographs 07/05/2021, pelvis and right hip radiographs 07/03/2021 FINDINGS: Bones/Joint/Cartilage Postsurgical changes are again seen of total right hip arthroplasty. Within limitations of metallic streak artifact, no perihardware lucency is seen to indicate hardware failure or loosening. No definite acute fracture is seen. The densities described on 08/18/2021 radiograph at 2:28 a.m. just lateral to the right acetabulum appear to represent long-term stable atherosclerotic calcifications within the right femoral artery rather than cortical bone fragments (axial series 4, image 32, sagittal series 7, image 78, coronal series 6, image 43). Ligaments Suboptimally assessed by CT. Muscles and Tendons There is heterogeneous intermediate density that likely represents hematoma seen within the right buttock soft tissues in between the right gluteus medius and gluteus maximus muscles and in the region of the trochanteric bursa measuring up to approximately 6.1 x 5.7 x 11 cm (transverse by AP by craniocaudal). Soft tissues Moderate atherosclerotic  calcifications are seen within the right femoral artery. Small fat containing right inguinal hernia. IMPRESSION: 1. Status post total right hip arthroplasty which appears in appropriate alignment with the right acetabular cup appropriately within the right acetabulum following prior dislocation of the entire right acetabular cup and femoral stem superiorly on the initial 08/18/2021 radiograph. 2. No acute fracture is seen. The densities overlying the right acetabulum when the acetabular cup was dislocated on the initial 08/18/2021 radiographs appears to represent atherosclerotic calcifications within the right femoral artery rather than cortical bone fragments. 3. Posterolateral right buttock soft tissue hematoma measuring up to 11 cm. Electronically Signed   By: Yvonne Kendall M.D.   On: 08/18/2021 08:23    DG Hip Unilat W or Wo Pelvis 2-3 Views Right   Result Date: 08/18/2021 CLINICAL DATA:  71 year old male, reduced right prosthetic hip dislocation. EXAM: DG HIP (WITH OR WITHOUT PELVIS) 2-3V RIGHT COMPARISON:  0228 hours today. FINDINGS: AP portable right hip series 0512 hours. Right bipolar hip arthroplasty dislocation appears reduced from earlier today. Acetabular component alignment now appears similar to 07/05/2021. The entire right femoral stem is not included. No acute osseous abnormality identified. Negative visible bowel gas. IMPRESSION: Relocation of the right hip arthroplasty since 0228 hours today. No acute fracture identified. Electronically Signed   By: Genevie Ann M.D.   On: 08/18/2021 05:56    DG Hip Unilat W or Wo Pelvis 2-3 Views Right   Result Date: 08/18/2021 CLINICAL DATA:  Fall injury. EXAM: DG HIP (WITH OR WITHOUT PELVIS) 2-3V RIGHT; PORTABLE CHEST - 1 VIEW COMPARISON:  Portable chest 07/03/2021, PA Lat 06/04/2021, AP pelvis and right hip 07/05/2021. FINDINGS: Chest: There is a right IJ dialysis catheter terminating in the upper right atrium. There is mild cardiomegaly with normal caliber  central vessels. Scarring changes in the lower lung fields are again noted. There is a low inspiration with no new opacity concerning for acute pneumonia. No pleural  effusion is seen. Stable mediastinal configuration with calcification in the aortic arch. No acute osseous abnormality is seen. There is osteopenia and degenerative change of the spine. AP pelvis with right hip: The entire right hip arthroplasty including the acetabular cup prosthesis has dislocated superiorly compared to the prior study. There is calcific debris lateral to the lower native acetabular bone which could be due to acetabular chip fractures, although some if not all of this was seen on the prior study. There is no further evidence of fractures. No pelvic fracture or diastasis is seen. IMPRESSION: 1. Low-inspiration chest study with chronic change in the bases. No convincing acute process. 2. Superior dislocation of the entire right hip arthroplasty including the acetabular cup prosthesis. 3. Calcific debris superimposing lateral to the lower native right acetabulum. Some if not all of this was present on the prior study although 1 or more new chip fractures off of the acetabular bone could be present within this. There is no further evidence of fractures. Electronically Signed   By: Telford Nab M.D.   On: 08/18/2021 03:10    DG Chest Portable 1 View   Result Date: 08/18/2021 CLINICAL DATA:  Fall injury. EXAM: DG HIP (WITH OR WITHOUT PELVIS) 2-3V RIGHT; PORTABLE CHEST - 1 VIEW COMPARISON:  Portable chest 07/03/2021, PA Lat 06/04/2021, AP pelvis and right hip 07/05/2021. FINDINGS: Chest: There is a right IJ dialysis catheter terminating in the upper right atrium. There is mild cardiomegaly with normal caliber central vessels. Scarring changes in the lower lung fields are again noted. There is a low inspiration with no new opacity concerning for acute pneumonia. No pleural effusion is seen. Stable mediastinal configuration with  calcification in the aortic arch. No acute osseous abnormality is seen. There is osteopenia and degenerative change of the spine. AP pelvis with right hip: The entire right hip arthroplasty including the acetabular cup prosthesis has dislocated superiorly compared to the prior study. There is calcific debris lateral to the lower native acetabular bone which could be due to acetabular chip fractures, although some if not all of this was seen on the prior study. There is no further evidence of fractures. No pelvic fracture or diastasis is seen. IMPRESSION: 1. Low-inspiration chest study with chronic change in the bases. No convincing acute process. 2. Superior dislocation of the entire right hip arthroplasty including the acetabular cup prosthesis. 3. Calcific debris superimposing lateral to the lower native right acetabulum. Some if not all of this was present on the prior study although 1 or more new chip fractures off of the acetabular bone could be present within this. There is no further evidence of fractures. Electronically Signed   By: Telford Nab M.D.   On: 08/18/2021 03:10        Assessment/Plan 1. End stage renal disease ([place]) - PERIPHERAL VASCULAR CATHETERIZATION; Standing - PERIPHERAL VASCULAR CATHETERIZATION Recommend:   The patient is experiencing increasing problems with their dialysis access.   Patient should have a tunneled catheter replacement.  The intention for intervention is to restore appropriate flow.  As well as improve the quality of dialysis therapy.   The risks, benefits and alternative therapies were reviewed in detail with the patient.  All questions were answered.  The patient agrees to proceed with exchange of his tunneled catheter.     The patient will follow up with me in the office after the procedure.   Hypertension: Continue antihypertensive medications as already ordered, these medications have been reviewed and there are no  changes at this time.     Diabetes: Continue hypoglycemic medications as already ordered, these medications have been reviewed and there are no changes at this time.   Hgb A1C to be monitored as already arranged by primary service    Coronary artery disease: Continue cardiac and antihypertensive medications as already ordered and reviewed, no changes at this time.   Continue statin as ordered and reviewed, no changes at this time   Nitrates PRN for chest pain          Hortencia Pilar, MD   08/25/2021 1:59 PM            Electronically signed by Katha Cabal, MD at 08/25/2021  2:03 PM

## 2021-08-25 NOTE — H&P (View-Only) (Signed)
Expand AllCollapse All                                                                                                                                                                                                                                                                                                                                                                                                                                                                                                                                                                                                 MRN : [id number]   Blake West is a 71 y.o. (April 07, 1950) male who presents with chief complaint of check  access.   History of Present Illness:    Patient is sent over by his dialysis center urgently for nonfunction of his tunneled catheter.  His last full dialysis was Saturday.  Patient denies fever or chills while on dialysis.  No pain at the catheter site.  No drainage.   Active Medications      Current Meds  Medication Sig   acetaminophen (TYLENOL) 325 MG tablet Take 650 mg by mouth every 6 (six) hours as needed for moderate pain.   acidophilus (RISAQUAD) CAPS capsule Take 2 capsules by mouth 3 (three) times daily.   Ascorbic Acid (VITAMIN C) 100 MG tablet Take 100 mg by mouth daily.   atorvastatin (LIPITOR) 80 MG tablet Take 1 tablet (80 mg total) by mouth daily.   B Complex Vitamins (B COMPLEX PO) Take 1 capsule by mouth daily.   calcium-vitamin D (OSCAL WITH D) 500-5 MG-MCG tablet Take 1 tablet by mouth.   Cholecalciferol 25 MCG (1000 UT) tablet Take 1 tablet (1,000 Units total) by mouth daily.   ferrous sulfate 325 (65 FE) MG EC tablet Take 325 mg by mouth daily.   magnesium 30 MG tablet Take 30 mg by mouth 2 (two) times daily.   Metoprolol Tartrate 37.5 MG TABS Take  37.5 mg by mouth 2 (two) times daily.   omega-3 acid ethyl esters (LOVAZA) 1 g capsule Take 1 g by mouth 2 (two) times daily.   polyethylene glycol (MIRALAX / GLYCOLAX) 17 g packet Take 17 g by mouth daily as needed for mild constipation.   predniSONE (DELTASONE) 20 MG tablet Take 1 tablet (20 mg total) by mouth daily with breakfast.   Pyridoxine HCl (VITAMIN B6 PO) Take 1 capsule by mouth daily.   QUEtiapine (SEROQUEL) 100 MG tablet Take 1 tablet (100 mg total) by mouth at bedtime.   sevelamer carbonate (RENVELA) 800 MG tablet Take 2 tablets (1,600 mg total) by mouth 3 (three) times daily with meals.   Sulfamethoxazole-Trimethoprim (BACTRIM PO) Take by mouth once.   thiamine 250 MG tablet Take 500 mg by mouth daily.   VITAMIN E PO Take 1 Capful by mouth daily.            Past Medical History:  Diagnosis Date   Abnormal EKG     Acute blood loss anemia (ABLA) 05/16/2021   Acute congestive heart failure ([place]) 04/25/2019   Acute hypoxemic respiratory failure ([place]) 04/23/2021   Acute renal failure superimposed on stage 4 chronic kidney disease ([place]) 04/19/2021   Acute urinary retention 04/25/2021   Biventricular failure (HCC)     CAD (coronary artery disease)     Class 2 severe obesity due to excess calories with serious comorbidity and body mass index (BMI) of 36.0 to 36.9 in adult [facility]) 04/25/2019   Colorectal polyps     Delirium 04/25/2021   GIB (gastrointestinal bleeding) 05/11/2021   Gout     Hematochezia     Hemorrhagic shock (HCC)     High anion gap metabolic acidosis 05/17/5359   Hyperkalemia 04/19/2021   Hyperlipidemia LDL goal <70     Hypertension     Hypokalemia 05/16/2021   Hypothyroidism     Kidney stone     Leukocytosis 04/29/2021   Malnutrition of moderate degree 04/21/2021   Multifocal pneumonia 04/19/2021   Pneumonia     Severe sepsis with acute organ dysfunction ([place]) 04/19/2021   SIRS (systemic inflammatory response syndrome) ([place]) 05/16/2021   Testicular hypofunction     Type  2  diabetes mellitus ([place]) 04/26/2019           Past Surgical History:  Procedure Laterality Date   CARDIAC CATHETERIZATION       COLONOSCOPY N/A 05/15/2021    Procedure: COLONOSCOPY;  Surgeon: Lin Landsman, MD;  Location: [facility] ENDOSCOPY;  Service: Gastroenterology;  Laterality: N/A;   DIALYSIS/PERMA CATHETER INSERTION N/A 04/22/2021    Procedure: DIALYSIS/PERMA CATHETER INSERTION;  Surgeon: Algernon Huxley, MD;  Location: [place] CV LAB;  Service: Cardiovascular;  Laterality: N/A;   DIALYSIS/PERMA CATHETER INSERTION N/A 06/07/2021    Procedure: DIALYSIS/PERMA CATHETER INSERTION;  Surgeon: Algernon Huxley, MD;  Location: [place] [facility];  Service: Cardiovascular;  Laterality: N/A;   DIALYSIS/PERMA CATHETER INSERTION N/A 07/21/2021    Procedure: DIALYSIS/PERMA CATHETER INSERTION;  Surgeon: Algernon Huxley, MD;  Location: [facility] LAB;  Service: Cardiovascular;  Laterality: N/A;   ESOPHAGOGASTRODUODENOSCOPY (EGD) WITH PROPOFOL N/A 05/13/2021    Procedure: ESOPHAGOGASTRODUODENOSCOPY (EGD) WITH PROPOFOL;  Surgeon: Jonathon Bellows, MD;  Location: [facility] ENDOSCOPY;  Service: Gastroenterology;  Laterality: N/A;   FOOT SURGERY       HIP ARTHROPLASTY Right 07/05/2021    Procedure: ARTHROPLASTY BIPOLAR HIP (HEMIARTHROPLASTY);  Surgeon: Willaim Sheng, MD;  Location: [place];  Service: Orthopedics;  Laterality: Right;   RIGHT/LEFT HEART CATH AND CORONARY ANGIOGRAPHY N/A 04/30/2019    Procedure: RIGHT/LEFT HEART CATH AND CORONARY ANGIOGRAPHY;  Surgeon: Troy Sine, MD;  Location: [place] [facility] LAB;  Service: Cardiovascular;  Laterality: N/A;      Social History Social History         Tobacco Use   Smoking status: Former      Packs/day: 1.00      Years: 30.00      Total pack years: 30.00      Types: Cigarettes      Quit date: 01/24/2002      Years since quitting: 19.5   Smokeless tobacco: Never  Vaping Use   Vaping Use: Never used  Substance Use Topics   Alcohol use: No   Drug use:  No      Family History      Family History  Problem Relation Age of Onset   Heart attack Mother 55   Pulmonary fibrosis Father     Congestive Heart Failure Brother     Heart attack Brother     Heart attack Brother     Early death Sister     Kidney disease Paternal Grandfather     Diabetes Sister             Allergies  Allergen Reactions   Codeine Other (See Comments)      Joints hurt  Other reaction(s): Other (See Comments) Joints hurt  Other reaction(s): Other (See Comments) Joints hurt  Joints hurt      Citalopram Other (See Comments)      agitation   Gabapentin Other (See Comments)      agitation   Lyrica [Pregabalin] Other (See Comments)      agitation        REVIEW OF SYSTEMS (Negative unless checked)   Constitutional: [] Weight loss  [] Fever  [] Chills Cardiac: [] Chest pain   [] Chest pressure   [] Palpitations   [] Shortness of breath when laying flat   [] Shortness of breath with exertion. Vascular:  [] Pain in legs with walking   [] Pain in legs at rest  [] History of DVT   [] Phlebitis   [] Swelling in legs   [] Varicose veins   [] Non-healing  ulcers Pulmonary:   [] Uses home oxygen   [] Productive cough   [] Hemoptysis   [] Wheeze  [] COPD   [] Asthma Neurologic:  [] Dizziness   [] Seizures   [] History of stroke   [] History of TIA  [] Aphasia   [] Vissual changes   [] Weakness or numbness in arm   [] Weakness or numbness in leg Musculoskeletal:   [] Joint swelling   [] Joint pain   [] Low back pain Hematologic:  [] Easy bruising  [] Easy bleeding   [] Hypercoagulable state   [] Anemic Gastrointestinal:  [] Diarrhea   [] Vomiting  [] Gastroesophageal reflux/heartburn   [] Difficulty swallowing. Genitourinary:  [x] Chronic kidney disease   [] Difficult urination  [] Frequent urination   [] Blood in urine Skin:  [] Rashes   [] Ulcers  Psychological:  [] History of anxiety   []  History of major depression.   Physical Examination         Vitals:    08/25/21 1338 08/25/21 1343 08/25/21 1348 08/25/21  1353  BP: (!) 121/100 120/85 (!) 117/102 118/84  Pulse: (!) 101 (!) 110 (!) 117 (!) 116  Resp: 17 11 16 20   Temp:          TempSrc:          SpO2: 100% 100% 97% 96%  Weight:          Height:            Body mass index is 28.87 kg/m. Gen: WD/WN, NAD Head: /AT, No temporalis wasting.  Ear/Nose/Throat: Hearing grossly intact, nares w/o erythema or drainage Eyes: PER, EOMI, sclera nonicteric.  Neck: Supple, no gross masses or lesions.  No JVD.  Pulmonary:  Good air movement, no audible wheezing, no use of accessory muscles.  Cardiac: RRR, precordium non-hyperdynamic. Vascular:   Right IJ tunnel catheter clean dry and intact Vessel Right Left  Radial Palpable Palpable  Brachial Palpable Palpable  Gastrointestinal: soft, non-distended. No guarding/no peritoneal signs.  Musculoskeletal: M/S 5/5 throughout.  No deformity.  Neurologic: CN 2-12 intact. Pain and light touch intact in extremities.  Symmetrical.  Speech is fluent. Motor exam as listed above. Psychiatric: Judgment intact, Mood & affect appropriate for pt's clinical situation. Dermatologic: No rashes or ulcers noted.  No changes consistent with cellulitis.     CBC Recent Labs       Lab Results  Component Value Date    WBC 12.7 (H) 08/18/2021    HGB 8.3 (L) 08/18/2021    HCT 26.9 (L) 08/18/2021    MCV 98.5 08/18/2021    PLT 205 08/18/2021        BMET Labs (Brief)          Component Value Date/Time    NA 142 08/18/2021 0208    NA 138 06/26/2019 1416    K 4.7 08/18/2021 0208    CL 104 08/18/2021 0208    CO2 27 08/18/2021 0208    GLUCOSE 126 (H) 08/18/2021 0208    BUN 66 (H) 08/18/2021 0208    BUN 16 06/26/2019 1416    CREATININE 5.70 (H) 08/18/2021 0208    CALCIUM 8.1 (L) 08/18/2021 0208    GFRNONAA 10 (L) 08/18/2021 0208    GFRAA >60 07/05/2019 0952      Estimated Creatinine Clearance: 13.9 mL/min (A) (by C-G formula based on SCr of 5.7 mg/dL (H)).   COAG Recent Labs       Lab Results  Component  Value Date    INR 1.1 07/03/2021    INR 1.0 06/04/2021    INR 1.3 (H) 05/13/2021  Radiology  Imaging Results  PERIPHERAL VASCULAR CATHETERIZATION   Result Date: 08/25/2021 See surgical note for result.   CT Hip Right Wo Contrast   Result Date: 08/18/2021 CLINICAL DATA:  Hip dislocation.  Postreduction evaluation. EXAM: CT OF THE RIGHT HIP WITHOUT CONTRAST TECHNIQUE: Multidetector CT imaging of the right hip was performed according to the standard protocol. Multiplanar CT image reconstructions were also generated. RADIATION DOSE REDUCTION: This exam was performed according to the departmental dose-optimization program which includes automated exposure control, adjustment of the mA and/or kV according to patient size and/or use of iterative reconstruction technique. COMPARISON:  Pelvis and right hip radiographs 08/18/2021 (multiple studies), right hip radiographs 07/05/2021, pelvis and right hip radiographs 07/03/2021 FINDINGS: Bones/Joint/Cartilage Postsurgical changes are again seen of total right hip arthroplasty. Within limitations of metallic streak artifact, no perihardware lucency is seen to indicate hardware failure or loosening. No definite acute fracture is seen. The densities described on 08/18/2021 radiograph at 2:28 a.m. just lateral to the right acetabulum appear to represent long-term stable atherosclerotic calcifications within the right femoral artery rather than cortical bone fragments (axial series 4, image 32, sagittal series 7, image 78, coronal series 6, image 43). Ligaments Suboptimally assessed by CT. Muscles and Tendons There is heterogeneous intermediate density that likely represents hematoma seen within the right buttock soft tissues in between the right gluteus medius and gluteus maximus muscles and in the region of the trochanteric bursa measuring up to approximately 6.1 x 5.7 x 11 cm (transverse by AP by craniocaudal). Soft tissues Moderate atherosclerotic  calcifications are seen within the right femoral artery. Small fat containing right inguinal hernia. IMPRESSION: 1. Status post total right hip arthroplasty which appears in appropriate alignment with the right acetabular cup appropriately within the right acetabulum following prior dislocation of the entire right acetabular cup and femoral stem superiorly on the initial 08/18/2021 radiograph. 2. No acute fracture is seen. The densities overlying the right acetabulum when the acetabular cup was dislocated on the initial 08/18/2021 radiographs appears to represent atherosclerotic calcifications within the right femoral artery rather than cortical bone fragments. 3. Posterolateral right buttock soft tissue hematoma measuring up to 11 cm. Electronically Signed   By: Yvonne Kendall M.D.   On: 08/18/2021 08:23    DG Hip Unilat W or Wo Pelvis 2-3 Views Right   Result Date: 08/18/2021 CLINICAL DATA:  71 year old male, reduced right prosthetic hip dislocation. EXAM: DG HIP (WITH OR WITHOUT PELVIS) 2-3V RIGHT COMPARISON:  0228 hours today. FINDINGS: AP portable right hip series 0512 hours. Right bipolar hip arthroplasty dislocation appears reduced from earlier today. Acetabular component alignment now appears similar to 07/05/2021. The entire right femoral stem is not included. No acute osseous abnormality identified. Negative visible bowel gas. IMPRESSION: Relocation of the right hip arthroplasty since 0228 hours today. No acute fracture identified. Electronically Signed   By: Genevie Ann M.D.   On: 08/18/2021 05:56    DG Hip Unilat W or Wo Pelvis 2-3 Views Right   Result Date: 08/18/2021 CLINICAL DATA:  Fall injury. EXAM: DG HIP (WITH OR WITHOUT PELVIS) 2-3V RIGHT; PORTABLE CHEST - 1 VIEW COMPARISON:  Portable chest 07/03/2021, PA Lat 06/04/2021, AP pelvis and right hip 07/05/2021. FINDINGS: Chest: There is a right IJ dialysis catheter terminating in the upper right atrium. There is mild cardiomegaly with normal caliber  central vessels. Scarring changes in the lower lung fields are again noted. There is a low inspiration with no new opacity concerning for acute pneumonia. No pleural  effusion is seen. Stable mediastinal configuration with calcification in the aortic arch. No acute osseous abnormality is seen. There is osteopenia and degenerative change of the spine. AP pelvis with right hip: The entire right hip arthroplasty including the acetabular cup prosthesis has dislocated superiorly compared to the prior study. There is calcific debris lateral to the lower native acetabular bone which could be due to acetabular chip fractures, although some if not all of this was seen on the prior study. There is no further evidence of fractures. No pelvic fracture or diastasis is seen. IMPRESSION: 1. Low-inspiration chest study with chronic change in the bases. No convincing acute process. 2. Superior dislocation of the entire right hip arthroplasty including the acetabular cup prosthesis. 3. Calcific debris superimposing lateral to the lower native right acetabulum. Some if not all of this was present on the prior study although 1 or more new chip fractures off of the acetabular bone could be present within this. There is no further evidence of fractures. Electronically Signed   By: Telford Nab M.D.   On: 08/18/2021 03:10    DG Chest Portable 1 View   Result Date: 08/18/2021 CLINICAL DATA:  Fall injury. EXAM: DG HIP (WITH OR WITHOUT PELVIS) 2-3V RIGHT; PORTABLE CHEST - 1 VIEW COMPARISON:  Portable chest 07/03/2021, PA Lat 06/04/2021, AP pelvis and right hip 07/05/2021. FINDINGS: Chest: There is a right IJ dialysis catheter terminating in the upper right atrium. There is mild cardiomegaly with normal caliber central vessels. Scarring changes in the lower lung fields are again noted. There is a low inspiration with no new opacity concerning for acute pneumonia. No pleural effusion is seen. Stable mediastinal configuration with  calcification in the aortic arch. No acute osseous abnormality is seen. There is osteopenia and degenerative change of the spine. AP pelvis with right hip: The entire right hip arthroplasty including the acetabular cup prosthesis has dislocated superiorly compared to the prior study. There is calcific debris lateral to the lower native acetabular bone which could be due to acetabular chip fractures, although some if not all of this was seen on the prior study. There is no further evidence of fractures. No pelvic fracture or diastasis is seen. IMPRESSION: 1. Low-inspiration chest study with chronic change in the bases. No convincing acute process. 2. Superior dislocation of the entire right hip arthroplasty including the acetabular cup prosthesis. 3. Calcific debris superimposing lateral to the lower native right acetabulum. Some if not all of this was present on the prior study although 1 or more new chip fractures off of the acetabular bone could be present within this. There is no further evidence of fractures. Electronically Signed   By: Telford Nab M.D.   On: 08/18/2021 03:10        Assessment/Plan 1. End stage renal disease ([place]) - PERIPHERAL VASCULAR CATHETERIZATION; Standing - PERIPHERAL VASCULAR CATHETERIZATION Recommend:   The patient is experiencing increasing problems with their dialysis access.   Patient should have a tunneled catheter replacement.  The intention for intervention is to restore appropriate flow.  As well as improve the quality of dialysis therapy.   The risks, benefits and alternative therapies were reviewed in detail with the patient.  All questions were answered.  The patient agrees to proceed with exchange of his tunneled catheter.     The patient will follow up with me in the office after the procedure.   Hypertension: Continue antihypertensive medications as already ordered, these medications have been reviewed and there are no  changes at this time.     Diabetes: Continue hypoglycemic medications as already ordered, these medications have been reviewed and there are no changes at this time.   Hgb A1C to be monitored as already arranged by primary service    Coronary artery disease: Continue cardiac and antihypertensive medications as already ordered and reviewed, no changes at this time.   Continue statin as ordered and reviewed, no changes at this time   Nitrates PRN for chest pain          Hortencia Pilar, MD   08/25/2021 1:59 PM            Electronically signed by Katha Cabal, MD at 08/25/2021  2:03 PM

## 2021-08-25 NOTE — Interval H&P Note (Signed)
History and Physical Interval Note:  08/25/2021 2:07 PM  Blake West  has presented today for surgery, with the diagnosis of Perma Cath Exchange  End Stage Renal.  The various methods of treatment have been discussed with the patient and family. After consideration of risks, benefits and other options for treatment, the patient has consented to  Procedure(s): DIALYSIS/PERMA CATHETER INSERTION (N/A) as a surgical intervention.  The patient's history has been reviewed, patient examined, no change in status, stable for surgery.  I have reviewed the patient's chart and labs.  Questions were answered to the patient's satisfaction.     Hortencia Pilar

## 2021-08-25 NOTE — Op Note (Signed)
[facility]   OPERATIVE NOTE     PROCEDURE: 1. Exchange right IJ tunneled dialysis catheter over wire same access   PRE-OPERATIVE DIAGNOSIS: Complication of dialysis device with nonfunction of tunneled catheter; end-stage renal requiring hemodialysis  POST-OPERATIVE DIAGNOSIS: same as above  SURGEON: Katha Cabal, M.D.  ANESTHESIA: Conscious sedation was administered under my direct supervision by the interventional radiology RN.  IV Versed plus fentanyl were utilized. Continuous ECG, pulse oximetry and blood pressure was monitored throughout the entire procedure.  Conscious sedation was for a total of 12 minutes.  ESTIMATED BLOOD LOSS: Minimal  FINDING(S): 1.  Tips of the catheter in the right atrium on fluoroscopy 2.  No obvious pneumothorax on fluoroscopy  SPECIMEN(S):  none  INDICATIONS:   Blake West is a 71 y.o. male  presents with end stage renal disease.  Therefore, the patient requires a tunneled dialysis catheter placement.  The patient is informed of  the risks catheter placement include but are not limited to: bleeding, infection, central venous injury, pneumothorax, possible venous stenosis, possible malpositioning in the venous system, and possible infections related to long-term catheter presence.  The patient was aware of these risks and agreed to proceed.  DESCRIPTION: The patient was taken back to Special Procedure suite.  Prior to sedation, the patient was given IV antibiotics.  After obtaining adequate sedation, the patient was prepped and draped in the standard fashion for a chest or neck tunneled dialysis catheter placement.  Appropriate Time Out is called.   The the right neck and chest wall are then infiltrated with 1% Lidocaine with epinepherine.  A 19 cm tip to cuff catheter is then selected, opened on the back table and prepped.  The cuff of the existing catheter is localized.  Using both blunt and sharp dissection the cuff  is then freed from surrounding attachments. The catheter is then controlled with a hemostat and transected above the level of the cuff.  Under fluoroscopy an Amplatz Super Stiff wire is introduced through the catheter and negotiated into the inferior vena cava. The remaining portion of the catheter is then removed without difficulty.  A new catheter is then threaded over the wire and advanced under fluoroscopy and positioned so that the catheter tip is in the proximal atrium.  Each port was tested by aspirating and flushing.  No resistance was noted.  Each port was then thoroughly flushed with heparinized saline.  The catheter was secured in placed with two interrupted stitches of 0 silk tied to the catheter.   Each port was then packed with concentrated heparin (10,000 Units/mL) at the manufacturer recommended volumes to each port.  Sterile caps were applied to each port.  On completion fluoroscopy, the tips of the catheter were in the right atrium, and there was no evidence of pneumothorax.  COMPLICATIONS: None  CONDITION: Blake West 08/25/2021,2:07 PM [facility] Office: [phone]   08/25/2021, 2:07 PM

## 2021-08-26 ENCOUNTER — Encounter: Payer: Self-pay | Admitting: Vascular Surgery

## 2021-08-26 ENCOUNTER — Emergency Department
Admission: EM | Admit: 2021-08-26 | Discharge: 2021-08-26 | Disposition: A | Discharge status: Home / Self Care | Payer: PPO | Attending: Emergency Medicine | Admitting: Emergency Medicine

## 2021-08-26 ENCOUNTER — Emergency Department: Payer: PPO

## 2021-08-26 ENCOUNTER — Other Ambulatory Visit: Payer: Self-pay

## 2021-08-26 ENCOUNTER — Other Ambulatory Visit (INDEPENDENT_AMBULATORY_CARE_PROVIDER_SITE_OTHER): Payer: Self-pay | Admitting: Vascular Surgery

## 2021-08-26 ENCOUNTER — Telehealth (INDEPENDENT_AMBULATORY_CARE_PROVIDER_SITE_OTHER): Payer: Self-pay

## 2021-08-26 DIAGNOSIS — R079 Chest pain, unspecified: Secondary | ICD-10-CM | POA: Diagnosis not present

## 2021-08-26 DIAGNOSIS — N186 End stage renal disease: Secondary | ICD-10-CM

## 2021-08-26 DIAGNOSIS — M549 Dorsalgia, unspecified: Secondary | ICD-10-CM

## 2021-08-26 DIAGNOSIS — I10 Essential (primary) hypertension: Secondary | ICD-10-CM | POA: Diagnosis not present

## 2021-08-26 DIAGNOSIS — M79602 Pain in left arm: Secondary | ICD-10-CM | POA: Insufficient documentation

## 2021-08-26 DIAGNOSIS — M545 Low back pain, unspecified: Secondary | ICD-10-CM | POA: Insufficient documentation

## 2021-08-26 DIAGNOSIS — D649 Anemia, unspecified: Secondary | ICD-10-CM | POA: Diagnosis not present

## 2021-08-26 DIAGNOSIS — I700 Atherosclerosis of aorta: Secondary | ICD-10-CM | POA: Diagnosis not present

## 2021-08-26 DIAGNOSIS — Z992 Dependence on renal dialysis: Secondary | ICD-10-CM | POA: Diagnosis not present

## 2021-08-26 DIAGNOSIS — M79601 Pain in right arm: Secondary | ICD-10-CM | POA: Insufficient documentation

## 2021-08-26 DIAGNOSIS — R109 Unspecified abdominal pain: Secondary | ICD-10-CM | POA: Diagnosis not present

## 2021-08-26 DIAGNOSIS — N179 Acute kidney failure, unspecified: Secondary | ICD-10-CM | POA: Diagnosis not present

## 2021-08-26 DIAGNOSIS — M25511 Pain in right shoulder: Secondary | ICD-10-CM | POA: Diagnosis not present

## 2021-08-26 LAB — CBC WITH DIFFERENTIAL/PLATELET
Abs Immature Granulocytes: 0.39 10*3/uL — ABNORMAL HIGH (ref 0.00–0.07)
Basophils Absolute: 0 10*3/uL (ref 0.0–0.1)
Basophils Relative: 0 %
Eosinophils Absolute: 0.1 10*3/uL (ref 0.0–0.5)
Eosinophils Relative: 0 %
HCT: 24.5 % — ABNORMAL LOW (ref 39.0–52.0)
Hemoglobin: 7.6 g/dL — ABNORMAL LOW (ref 13.0–17.0)
Immature Granulocytes: 2 %
Lymphocytes Relative: 22 %
Lymphs Abs: 3.8 10*3/uL (ref 0.7–4.0)
MCH: 29.9 pg (ref 26.0–34.0)
MCHC: 31 g/dL (ref 30.0–36.0)
MCV: 96.5 fL (ref 80.0–100.0)
Monocytes Absolute: 1.1 10*3/uL — ABNORMAL HIGH (ref 0.1–1.0)
Monocytes Relative: 6 %
Neutro Abs: 12.4 10*3/uL — ABNORMAL HIGH (ref 1.7–7.7)
Neutrophils Relative %: 70 %
Platelets: 206 10*3/uL (ref 150–400)
RBC: 2.54 MIL/uL — ABNORMAL LOW (ref 4.22–5.81)
RDW: 19.6 % — ABNORMAL HIGH (ref 11.5–15.5)
WBC: 17.8 10*3/uL — ABNORMAL HIGH (ref 4.0–10.5)
nRBC: 0.5 % — ABNORMAL HIGH (ref 0.0–0.2)

## 2021-08-26 LAB — URINALYSIS, ROUTINE W REFLEX MICROSCOPIC
Bacteria, UA: NONE SEEN
Bilirubin Urine: NEGATIVE
Glucose, UA: NEGATIVE mg/dL
Hgb urine dipstick: NEGATIVE
Ketones, ur: NEGATIVE mg/dL
Leukocytes,Ua: NEGATIVE
Nitrite: NEGATIVE
Protein, ur: >=300 mg/dL — AB
Specific Gravity, Urine: 1.014 (ref 1.005–1.030)
Squamous Epithelial / HPF: NONE SEEN (ref 0–5)
pH: 6 (ref 5.0–8.0)

## 2021-08-26 LAB — COMPREHENSIVE METABOLIC PANEL
ALT: 95 U/L — ABNORMAL HIGH (ref 0–44)
AST: 127 U/L — ABNORMAL HIGH (ref 15–41)
Albumin: 3.4 g/dL — ABNORMAL LOW (ref 3.5–5.0)
Alkaline Phosphatase: 49 U/L (ref 38–126)
Anion gap: 15 (ref 5–15)
BUN: 88 mg/dL — ABNORMAL HIGH (ref 8–23)
CO2: 20 mmol/L — ABNORMAL LOW (ref 22–32)
Calcium: 8.5 mg/dL — ABNORMAL LOW (ref 8.9–10.3)
Chloride: 103 mmol/L (ref 98–111)
Creatinine, Ser: 6.01 mg/dL — ABNORMAL HIGH (ref 0.61–1.24)
GFR, Estimated: 9 mL/min — ABNORMAL LOW (ref >60)
Glucose, Bld: 109 mg/dL — ABNORMAL HIGH (ref 70–99)
Potassium: 5 mmol/L (ref 3.5–5.1)
Sodium: 138 mmol/L (ref 135–145)
Total Bilirubin: 0.9 mg/dL (ref 0.3–1.2)
Total Protein: 6.3 g/dL — ABNORMAL LOW (ref 6.5–8.1)

## 2021-08-26 LAB — LACTIC ACID, PLASMA
Lactic Acid, Venous: 2.8 mmol/L (ref 0.5–1.9)
Lactic Acid, Venous: 1.9 mmol/L (ref 0.5–1.9)

## 2021-08-26 LAB — TROPONIN I (HIGH SENSITIVITY)
Troponin I (High Sensitivity): 30 ng/L — ABNORMAL HIGH (ref <18)
Troponin I (High Sensitivity): 31 ng/L — ABNORMAL HIGH (ref <18)

## 2021-08-26 MED ORDER — FENTANYL CITRATE PF 50 MCG/ML IJ SOSY
50.0000 ug | PREFILLED_SYRINGE | Freq: Once | INTRAMUSCULAR | Status: AC
  Administered 2021-08-26: 50 ug via INTRAVENOUS
  Filled 2021-08-26: qty 1

## 2021-08-26 MED ORDER — OXYCODONE-ACETAMINOPHEN 5-325 MG PO TABS: 1.0000 | ORAL_TABLET | Freq: Four times a day (QID) | ORAL | 0 refills | Status: AC | PRN

## 2021-08-26 MED ORDER — OXYCODONE-ACETAMINOPHEN 5-325 MG PO TABS
1.0000 | ORAL_TABLET | Freq: Once | ORAL | Status: AC
  Administered 2021-08-26: 1 via ORAL
  Filled 2021-08-26: qty 1

## 2021-08-26 NOTE — Telephone Encounter (Signed)
Called pt's wife to let her know to call PCP or Nephrology, or take he to the ED if he was really hurting bad.  She states that he is having pain in his clavicle area, shoulder and chest.  I advised her to take him to the ED to be evaluated.  She agreed that she would take him to [facility].

## 2021-08-26 NOTE — Discharge Instructions (Signed)
Please seek medical attention for any high fevers, chest pain, shortness of breath, change in behavior, persistent vomiting, bloody stool or any other new or concerning symptoms.  

## 2021-08-26 NOTE — ED Triage Notes (Addendum)
Pt reports having dialysis catheter changed in R upper chest yesterday and c/o site pain, generalized chest pain, and R shoulder pain since procedure.  Pain score 9/10.  Pt reports taking Tylenol w/o relief.  Denies SOB and n/v/d.

## 2021-08-26 NOTE — Telephone Encounter (Signed)
Pt's wife called stating that pt is in so much pain he cannot sleep, could not finish dialyzing today and she wants pain meds called in.  I spoke with Dr Delana Meyer and he said for them to call PCP or Nephrologist.  A perm cath exchange should not cause that type of pain.  If pt is is that much pain he advises the pt be seen in the ED.

## 2021-08-26 NOTE — ED Provider Notes (Signed)
[facility] Provider Note    Event Date/Time   First MD Initiated Contact with Patient 08/26/21 1749     (approximate)   History   Generalized Body Aches and Post-op Problem   HPI  EDITH LORD is a 71 y.o. male who presents to the emergency department today with primary concerns for pain to his low back and arms.  Some history is obtained by wife at bedside.  The patient apparently has had this pain in the past.  It will become worse when he gets dialysis.  Since having his dialysis catheter replaced yesterday the pain has been worse.  The patient denies any significant pain to his chest to myself.  Apparently while the patient was at a facility recently he was given Percocet which seemed to help with the pain.  He has tried other medications that have not been effective.  Patient denies any fevers.  Denies any chills.      Physical Exam   Triage Vital Signs: ED Triage Vitals  Enc Vitals Group     BP 08/26/21 1735 (!) 130/103     Pulse Rate 08/26/21 1735 (!) 104     Resp 08/26/21 1735 20     Temp 08/26/21 1735 97.8 F (36.6 C)     Temp Source 08/26/21 1735 Oral     SpO2 08/26/21 1735 100 %     Weight 08/26/21 1736 207 lb (93.9 kg)     Height 08/26/21 1736 5\' 11"  (1.803 m)     Head Circumference --      Peak Flow --      Pain Score 08/26/21 1736 9     Pain Loc --      Pain Edu? --      Excl. in [place]? --     Most recent vital signs: Vitals:   08/26/21 1735  BP: (!) 130/103  Pulse: (!) 104  Resp: 20  Temp: 97.8 F (36.6 C)  SpO2: 100%    General: Awake, alert, oriented. CV:  Good peripheral perfusion. Irregular rate and rhythm.  Resp:  Normal effort. Lungs clear. Abd:  No distention. Minimally tender in the left abdomen.     ED Results / Procedures / Treatments   Labs (all labs ordered are listed, but only abnormal results are displayed) Labs Reviewed  LACTIC ACID, PLASMA - Abnormal; Notable for the following components:       Result Value   Lactic Acid, Venous 2.8 (*)    All other components within normal limits  COMPREHENSIVE METABOLIC PANEL - Abnormal; Notable for the following components:   CO2 20 (*)    Glucose, Bld 109 (*)    BUN 88 (*)    Creatinine, Ser 6.01 (*)    Calcium 8.5 (*)    Total Protein 6.3 (*)    Albumin 3.4 (*)    AST 127 (*)    ALT 95 (*)    GFR, Estimated 9 (*)    All other components within normal limits  CBC WITH DIFFERENTIAL/PLATELET - Abnormal; Notable for the following components:   WBC 17.8 (*)    RBC 2.54 (*)    Hemoglobin 7.6 (*)    HCT 24.5 (*)    RDW 19.6 (*)    nRBC 0.5 (*)    Neutro Abs 12.4 (*)    Monocytes Absolute 1.1 (*)    Abs Immature Granulocytes 0.39 (*)    All other components within normal limits  URINALYSIS, ROUTINE W  REFLEX MICROSCOPIC - Abnormal; Notable for the following components:   Color, Urine YELLOW (*)    APPearance HAZY (*)    Protein, ur >=300 (*)    All other components within normal limits  TROPONIN I (HIGH SENSITIVITY) - Abnormal; Notable for the following components:   Troponin I (High Sensitivity) 30 (*)    All other components within normal limits  TROPONIN I (HIGH SENSITIVITY) - Abnormal; Notable for the following components:   Troponin I (High Sensitivity) 31 (*)    All other components within normal limits  CULTURE, BLOOD (ROUTINE X 2)  CULTURE, BLOOD (ROUTINE X 2)  LACTIC ACID, PLASMA     EKG  I, Nance Pear, attending physician, personally viewed and interpreted this EKG  EKG Time: 1742 Rate: 109 Rhythm: atrial fibrillation, with RVR Axis: normal Intervals: qtc 449 QRS: narrow, q waves v1 ST changes: no st elevation Impression: abnormal ekg   RADIOLOGY I independently interpreted and visualized the CXR. My interpretation: No pneumothorax Radiology interpretation: IMPRESSION:  1. No acute cardiopulmonary disease.     PROCEDURES:  Critical Care performed: No  Procedures   MEDICATIONS ORDERED IN  ED: Medications - No data to display   IMPRESSION / MDM / [place] / ED COURSE  I reviewed the triage vital signs and the nursing notes.                              Differential diagnosis includes, but is not limited to, acute on chronic back pain, intraabdominal infection, UTI.   Patient's presentation is most consistent with acute presentation with potential threat to life or bodily function.  Patient presented to the emergency department today with primary concern for back pain.  Does sound like patient has some chronic back pain issues but has gotten acutely worse since having his dialysis catheter replaced.  Denies any fevers.  Patient afebrile here in the emergency department.  Is in A-fib.  Blood work here shows a leukocytosis.  Additionally initial lactic acid level was elevated.  This however improved without any specific treatment.  Did obtain a CT scan of the abdomen pelvis which showed diverticular disease but no evidence of diverticulitis or any other significant abdominal infection.  Additionally both urine and chest x-ray without any obvious infection.  At this time do not have a clear explanation of the patient's leukocytosis.  We will send blood cultures.  Patient did feel improvement after pain medication here.  I did discuss with the patient and patient felt comfortable with plan for discharge with prescription for pain medication.  I did discuss that we sent blood cultures.  Encouraged patient to follow-up with his physician tomorrow.   FINAL CLINICAL IMPRESSION(S) / ED DIAGNOSES   Final diagnoses:  Back pain, unspecified back location, unspecified back pain laterality, unspecified chronicity     Rx / DC Orders   ED Discharge Orders     None        Note:  This document was prepared using Dragon voice recognition software and may include unintentional dictation errors.    Nance Pear, MD 08/26/21 2322

## 2021-08-26 NOTE — ED Notes (Signed)
1 set cultures sent to lab

## 2021-08-27 ENCOUNTER — Ambulatory Visit (INDEPENDENT_AMBULATORY_CARE_PROVIDER_SITE_OTHER): Payer: PPO

## 2021-08-27 ENCOUNTER — Ambulatory Visit (INDEPENDENT_AMBULATORY_CARE_PROVIDER_SITE_OTHER): Payer: PPO | Admitting: Nurse Practitioner

## 2021-08-27 ENCOUNTER — Encounter (INDEPENDENT_AMBULATORY_CARE_PROVIDER_SITE_OTHER): Payer: Self-pay | Admitting: Nurse Practitioner

## 2021-08-27 VITALS — BP 117/79 | HR 132 | Resp 17

## 2021-08-27 DIAGNOSIS — E7849 Other hyperlipidemia: Secondary | ICD-10-CM

## 2021-08-27 DIAGNOSIS — N186 End stage renal disease: Secondary | ICD-10-CM | POA: Diagnosis not present

## 2021-08-27 DIAGNOSIS — Z992 Dependence on renal dialysis: Secondary | ICD-10-CM | POA: Diagnosis not present

## 2021-08-27 DIAGNOSIS — E1159 Type 2 diabetes mellitus with other circulatory complications: Secondary | ICD-10-CM

## 2021-08-28 ENCOUNTER — Encounter (INDEPENDENT_AMBULATORY_CARE_PROVIDER_SITE_OTHER): Payer: Self-pay | Admitting: Nurse Practitioner

## 2021-08-28 DIAGNOSIS — Z20822 Contact with and (suspected) exposure to covid-19: Secondary | ICD-10-CM | POA: Diagnosis not present

## 2021-08-28 DIAGNOSIS — K259 Gastric ulcer, unspecified as acute or chronic, without hemorrhage or perforation: Secondary | ICD-10-CM | POA: Diagnosis not present

## 2021-08-28 DIAGNOSIS — I82513 Chronic embolism and thrombosis of femoral vein, bilateral: Secondary | ICD-10-CM | POA: Diagnosis not present

## 2021-08-28 DIAGNOSIS — I120 Hypertensive chronic kidney disease with stage 5 chronic kidney disease or end stage renal disease: Secondary | ICD-10-CM | POA: Diagnosis not present

## 2021-08-28 DIAGNOSIS — A419 Sepsis, unspecified organism: Secondary | ICD-10-CM | POA: Diagnosis not present

## 2021-08-28 DIAGNOSIS — I82412 Acute embolism and thrombosis of left femoral vein: Secondary | ICD-10-CM | POA: Diagnosis not present

## 2021-08-28 DIAGNOSIS — Z9981 Dependence on supplemental oxygen: Secondary | ICD-10-CM | POA: Diagnosis not present

## 2021-08-28 DIAGNOSIS — S73004A Unspecified dislocation of right hip, initial encounter: Secondary | ICD-10-CM | POA: Diagnosis not present

## 2021-08-28 DIAGNOSIS — J439 Emphysema, unspecified: Secondary | ICD-10-CM | POA: Diagnosis not present

## 2021-08-28 DIAGNOSIS — E875 Hyperkalemia: Secondary | ICD-10-CM | POA: Diagnosis not present

## 2021-08-28 DIAGNOSIS — T84020A Dislocation of internal right hip prosthesis, initial encounter: Secondary | ICD-10-CM | POA: Diagnosis not present

## 2021-08-28 DIAGNOSIS — E870 Hyperosmolality and hypernatremia: Secondary | ICD-10-CM | POA: Diagnosis not present

## 2021-08-28 DIAGNOSIS — J8410 Pulmonary fibrosis, unspecified: Secondary | ICD-10-CM | POA: Diagnosis not present

## 2021-08-28 DIAGNOSIS — E8721 Acute metabolic acidosis: Secondary | ICD-10-CM | POA: Diagnosis not present

## 2021-08-28 DIAGNOSIS — E872 Acidosis, unspecified: Secondary | ICD-10-CM | POA: Diagnosis not present

## 2021-08-28 DIAGNOSIS — Z888 Allergy status to other drugs, medicaments and biological substances status: Secondary | ICD-10-CM | POA: Diagnosis not present

## 2021-08-28 DIAGNOSIS — D649 Anemia, unspecified: Secondary | ICD-10-CM | POA: Diagnosis not present

## 2021-08-28 DIAGNOSIS — Z992 Dependence on renal dialysis: Secondary | ICD-10-CM | POA: Diagnosis not present

## 2021-08-28 DIAGNOSIS — D638 Anemia in other chronic diseases classified elsewhere: Secondary | ICD-10-CM | POA: Diagnosis not present

## 2021-08-28 DIAGNOSIS — Z885 Allergy status to narcotic agent status: Secondary | ICD-10-CM | POA: Diagnosis not present

## 2021-08-28 DIAGNOSIS — E1122 Type 2 diabetes mellitus with diabetic chronic kidney disease: Secondary | ICD-10-CM | POA: Diagnosis not present

## 2021-08-28 DIAGNOSIS — Z471 Aftercare following joint replacement surgery: Secondary | ICD-10-CM | POA: Diagnosis not present

## 2021-08-28 DIAGNOSIS — I10 Essential (primary) hypertension: Secondary | ICD-10-CM | POA: Diagnosis not present

## 2021-08-28 DIAGNOSIS — Z9989 Dependence on other enabling machines and devices: Secondary | ICD-10-CM | POA: Diagnosis not present

## 2021-08-28 DIAGNOSIS — N179 Acute kidney failure, unspecified: Secondary | ICD-10-CM | POA: Diagnosis not present

## 2021-08-28 DIAGNOSIS — Z96641 Presence of right artificial hip joint: Secondary | ICD-10-CM | POA: Diagnosis not present

## 2021-08-28 DIAGNOSIS — X58XXXA Exposure to other specified factors, initial encounter: Secondary | ICD-10-CM | POA: Diagnosis not present

## 2021-08-28 DIAGNOSIS — J90 Pleural effusion, not elsewhere classified: Secondary | ICD-10-CM | POA: Diagnosis not present

## 2021-08-28 DIAGNOSIS — S73034A Other anterior dislocation of right hip, initial encounter: Secondary | ICD-10-CM | POA: Diagnosis not present

## 2021-08-28 DIAGNOSIS — R042 Hemoptysis: Secondary | ICD-10-CM | POA: Diagnosis not present

## 2021-08-28 DIAGNOSIS — I4891 Unspecified atrial fibrillation: Secondary | ICD-10-CM | POA: Diagnosis not present

## 2021-08-28 DIAGNOSIS — J9811 Atelectasis: Secondary | ICD-10-CM | POA: Diagnosis not present

## 2021-08-28 DIAGNOSIS — Y998 Other external cause status: Secondary | ICD-10-CM | POA: Diagnosis not present

## 2021-08-28 DIAGNOSIS — I493 Ventricular premature depolarization: Secondary | ICD-10-CM | POA: Diagnosis not present

## 2021-08-28 DIAGNOSIS — D72829 Elevated white blood cell count, unspecified: Secondary | ICD-10-CM | POA: Diagnosis not present

## 2021-08-28 DIAGNOSIS — I7782 Antineutrophilic cytoplasmic antibody (ANCA) vasculitis: Secondary | ICD-10-CM | POA: Diagnosis not present

## 2021-08-28 DIAGNOSIS — I482 Chronic atrial fibrillation, unspecified: Secondary | ICD-10-CM | POA: Diagnosis not present

## 2021-08-28 DIAGNOSIS — I2510 Atherosclerotic heart disease of native coronary artery without angina pectoris: Secondary | ICD-10-CM | POA: Diagnosis not present

## 2021-08-28 DIAGNOSIS — R0602 Shortness of breath: Secondary | ICD-10-CM | POA: Diagnosis not present

## 2021-08-28 DIAGNOSIS — Z515 Encounter for palliative care: Secondary | ICD-10-CM | POA: Diagnosis not present

## 2021-08-28 DIAGNOSIS — I2699 Other pulmonary embolism without acute cor pulmonale: Secondary | ICD-10-CM | POA: Diagnosis not present

## 2021-08-28 DIAGNOSIS — R509 Fever, unspecified: Secondary | ICD-10-CM | POA: Diagnosis not present

## 2021-08-28 DIAGNOSIS — N186 End stage renal disease: Secondary | ICD-10-CM | POA: Diagnosis not present

## 2021-08-28 DIAGNOSIS — G4733 Obstructive sleep apnea (adult) (pediatric): Secondary | ICD-10-CM | POA: Diagnosis not present

## 2021-08-28 DIAGNOSIS — J189 Pneumonia, unspecified organism: Secondary | ICD-10-CM | POA: Diagnosis not present

## 2021-08-28 DIAGNOSIS — K5730 Diverticulosis of large intestine without perforation or abscess without bleeding: Secondary | ICD-10-CM | POA: Diagnosis not present

## 2021-08-28 DIAGNOSIS — E119 Type 2 diabetes mellitus without complications: Secondary | ICD-10-CM | POA: Diagnosis not present

## 2021-08-28 DIAGNOSIS — R0902 Hypoxemia: Secondary | ICD-10-CM | POA: Diagnosis not present

## 2021-08-28 NOTE — Progress Notes (Signed)
Subjective:    Patient ID: MAKEL MCMANN, male    DOB: [birth year], 71 y.o.   MRN: [id number] No chief complaint on file.   The patient is seen for follow up evaluation of dialysis access.  The patient recently started on dialysis and had their initial tunneled catheter placed.  There have not been any previous accesses.    The patient is right handed.   Current access is via a catheter which is functioning without significant issue currently.Marland Kitchen  He recently underwent a PermCath placement..  There have not been any episodes of catheter infection.  The patient denies fever and chills while on dialysis.  No tenderness or drainage at the exit site.  No recent shortening of the patient's walking distance or new symptoms consistent with claudication.  No history of rest pain symptoms. No new ulcers or wounds of the lower extremities have occurred.  The patient denies amaurosis fugax or recent TIA symptoms. There are no recent neurological changes noted. There is no history of DVT, PE or superficial thrombophlebitis. No recent episodes of angina or shortness of breath documented.  The patient has borderline access for a left brachial cephalic AV fistula    Review of Systems  Neurological:  Positive for weakness.  All other systems reviewed and are negative.      Objective:   Physical Exam Vitals reviewed.  HENT:     Head: Normocephalic.  Cardiovascular:     Rate and Rhythm: Normal rate.  Pulmonary:     Effort: Pulmonary effort is normal.  Skin:    General: Skin is warm and dry.  Neurological:     Mental Status: He is alert and oriented to person, place, and time.  Psychiatric:        Mood and Affect: Mood normal.        Behavior: Behavior normal.        Thought Content: Thought content normal.        Judgment: Judgment normal.     BP 117/79 (BP Location: Left Arm)   Pulse (!) 132   Resp 17   Past Medical History:  Diagnosis Date   Abnormal EKG    Acute blood loss  anemia ([facility]) 05/16/2021   Acute congestive heart failure ([place]) 04/25/2019   Acute hypoxemic respiratory failure ([place]) 04/23/2021   Acute renal failure superimposed on stage 4 chronic kidney disease ([place]) 04/19/2021   Acute urinary retention 04/25/2021   Biventricular failure (HCC)    CAD (coronary artery disease)    Class 2 severe obesity due to excess calories with serious comorbidity and body mass index (BMI) of 36.0 to 36.9 in adult [facility]) 04/25/2019   Colorectal polyps    Delirium 04/25/2021   GIB (gastrointestinal bleeding) 05/11/2021   Gout    Hematochezia    Hemorrhagic shock (HCC)    High anion gap metabolic acidosis 02/20/5168   Hyperkalemia 04/19/2021   Hyperlipidemia LDL goal <70    Hypertension    Hypokalemia 05/16/2021   Hypothyroidism    Kidney stone    Leukocytosis 04/29/2021   Malnutrition of moderate degree 04/21/2021   Multifocal pneumonia 04/19/2021   Pneumonia    Severe sepsis with acute organ dysfunction ([place]) 04/19/2021   SIRS (systemic inflammatory response syndrome) ([place]) 05/16/2021   Testicular hypofunction    Type 2 diabetes mellitus ([place]) 04/26/2019    Social History   Socioeconomic History   Marital status: Married    Spouse name: Not on file   Number of  children: Not on file   Years of education: Not on file   Highest education level: Not on file  Occupational History   Occupation: Chief Financial Officer  Tobacco Use   Smoking status: Former    Packs/day: 1.00    Years: 30.00    Total pack years: 30.00    Types: Cigarettes    Quit date: 01/24/2002    Years since quitting: 19.6   Smokeless tobacco: Never  Vaping Use   Vaping Use: Never used  Substance and Sexual Activity   Alcohol use: No   Drug use: No   Sexual activity: Not on file  Other Topics Concern   Not on file  Social History Narrative   Not on file   Social Determinants of Health   Financial Resource Strain: Not on file  Food Insecurity: Not on file  Transportation Needs: Not on file   Physical Activity: Not on file  Stress: Not on file  Social Connections: Not on file  Intimate Partner Violence: Not on file    Past Surgical History:  Procedure Laterality Date   CARDIAC CATHETERIZATION     COLONOSCOPY N/A 05/15/2021   Procedure: COLONOSCOPY;  Surgeon: Lin Landsman, MD;  Location: [place];  Service: Gastroenterology;  Laterality: N/A;   DIALYSIS/PERMA CATHETER INSERTION N/A 04/22/2021   Procedure: DIALYSIS/PERMA CATHETER INSERTION;  Surgeon: Algernon Huxley, MD;  Location: [place] CV LAB;  Service: Cardiovascular;  Laterality: N/A;   DIALYSIS/PERMA CATHETER INSERTION N/A 06/07/2021   Procedure: DIALYSIS/PERMA CATHETER INSERTION;  Surgeon: Algernon Huxley, MD;  Location: [place] CV LAB;  Service: Cardiovascular;  Laterality: N/A;   DIALYSIS/PERMA CATHETER INSERTION N/A 07/21/2021   Procedure: DIALYSIS/PERMA CATHETER INSERTION;  Surgeon: Algernon Huxley, MD;  Location: [place] CV LAB;  Service: Cardiovascular;  Laterality: N/A;   DIALYSIS/PERMA CATHETER INSERTION N/A 08/25/2021   Procedure: DIALYSIS/PERMA CATHETER INSERTION;  Surgeon: Katha Cabal, MD;  Location: [place] CV LAB;  Service: Cardiovascular;  Laterality: N/A;   ESOPHAGOGASTRODUODENOSCOPY (EGD) WITH PROPOFOL N/A 05/13/2021   Procedure: ESOPHAGOGASTRODUODENOSCOPY (EGD) WITH PROPOFOL;  Surgeon: Jonathon Bellows, MD;  Location: [facility];  Service: Gastroenterology;  Laterality: N/A;   FOOT SURGERY     HIP ARTHROPLASTY Right 07/05/2021   Procedure: ARTHROPLASTY BIPOLAR HIP (HEMIARTHROPLASTY);  Surgeon: Willaim Sheng, MD;  Location: [place];  Service: Orthopedics;  Laterality: Right;   RIGHT/LEFT HEART CATH AND CORONARY ANGIOGRAPHY N/A 04/30/2019   Procedure: RIGHT/LEFT HEART CATH AND CORONARY ANGIOGRAPHY;  Surgeon: Troy Sine, MD;  Location: [place] [facility];  Service: Cardiovascular;  Laterality: N/A;    Family History  Problem Relation Age of Onset   Heart attack Mother  22   Pulmonary fibrosis Father    Congestive Heart Failure Brother    Heart attack Brother    Heart attack Brother    Early death Sister    Kidney disease Paternal Grandfather    Diabetes Sister     Allergies  Allergen Reactions   Codeine Other (See Comments)    Joints hurt  Other reaction(s): Other (See Comments) Joints hurt  Other reaction(s): Other (See Comments) Joints hurt  Joints hurt     Citalopram Other (See Comments)    agitation   Gabapentin Other (See Comments)    agitation   Lyrica [Pregabalin] Other (See Comments)    agitation       Latest Ref Rng & Units 08/26/2021    6:41 PM 08/18/2021    2:08 AM 07/09/2021  6:07 AM  CBC  WBC 4.0 - 10.5 K/uL 17.8  12.7  5.5   Hemoglobin 13.0 - 17.0 g/dL 7.6  8.3  9.4   Hematocrit 39.0 - 52.0 % 24.5  26.9  28.9   Platelets 150 - 400 K/uL 206  205  139       CMP     Component Value Date/Time   NA 138 08/26/2021 1841   NA 138 06/26/2019 1416   K 5.0 08/26/2021 1841   CL 103 08/26/2021 1841   CO2 20 (L) 08/26/2021 1841   GLUCOSE 109 (H) 08/26/2021 1841   BUN 88 (H) 08/26/2021 1841   BUN 16 06/26/2019 1416   CREATININE 6.01 (H) 08/26/2021 1841   CALCIUM 8.5 (L) 08/26/2021 1841   PROT 6.3 (L) 08/26/2021 1841   ALBUMIN 3.4 (L) 08/26/2021 1841   AST 127 (H) 08/26/2021 1841   ALT 95 (H) 08/26/2021 1841   ALKPHOS 49 08/26/2021 1841   BILITOT 0.9 08/26/2021 1841   GFRNONAA 9 (L) 08/26/2021 1841   GFRAA >60 07/05/2019 0952     No results found.     Assessment & Plan:   1. End stage renal disease ([place]) Recommend:  At this time the patient does not have appropriate extremity access for dialysis  Patient should have a left upper extremity brachial axillary AV graft created.  The risks, benefits and alternative therapies were reviewed in detail with the patient.  All questions were answered.  The patient agrees to proceed with surgery.   The patient will follow up with me in the office after the surgery.    2. Other hyperlipidemia Continue statin as ordered and reviewed, no changes at this time   3. Type 2 diabetes mellitus with other circulatory complication, without long-term current use of insulin (HCC) Continue hypoglycemic medications as already ordered, these medications have been reviewed and there are no changes at this time.  Hgb A1C to be monitored as already arranged by primary service    Current Outpatient Medications on File Prior to Visit  Medication Sig Dispense Refill   acetaminophen (TYLENOL) 325 MG tablet Take 650 mg by mouth every 6 (six) hours as needed for moderate pain.     Ascorbic Acid (VITAMIN C) 100 MG tablet Take 100 mg by mouth daily.     B Complex Vitamins (B COMPLEX PO) Take 1 capsule by mouth daily.     calcium-vitamin D (OSCAL WITH D) 500-5 MG-MCG tablet Take 1 tablet by mouth.     Cholecalciferol 25 MCG (1000 UT) tablet Take 1 tablet (1,000 Units total) by mouth daily.     magnesium 30 MG tablet Take 30 mg by mouth 2 (two) times daily.     Metoprolol Tartrate 37.5 MG TABS Take 37.5 mg by mouth 2 (two) times daily.     midodrine (PROAMATINE) 10 MG tablet Take 10 mg by mouth 3 (three) times a week. Tuesday, Thursday, and Saturday     omega-3 acid ethyl esters (LOVAZA) 1 g capsule Take 1 g by mouth 2 (two) times daily.     oxyCODONE-acetaminophen (PERCOCET) 5-325 MG tablet Take 1 tablet by mouth every 6 (six) hours as needed for severe pain. 15 tablet 0   pantoprazole (PROTONIX) 40 MG tablet Take 1 tablet (40 mg total) by mouth 2 (two) times daily. 30 tablet 1   polyethylene glycol (MIRALAX / GLYCOLAX) 17 g packet Take 17 g by mouth daily as needed for mild constipation. 14 each 0  predniSONE (DELTASONE) 20 MG tablet Take 1 tablet (20 mg total) by mouth daily with breakfast.  0   Pyridoxine HCl (VITAMIN B6 PO) Take 1 capsule by mouth daily.     QUEtiapine (SEROQUEL) 100 MG tablet Take 1 tablet (100 mg total) by mouth at bedtime. 30 tablet 1   sevelamer  carbonate (RENVELA) 800 MG tablet Take 2 tablets (1,600 mg total) by mouth 3 (three) times daily with meals.     Sulfamethoxazole-Trimethoprim (BACTRIM PO) Take by mouth once.     thiamine 250 MG tablet Take 500 mg by mouth daily.     VITAMIN E PO Take 1 Capful by mouth daily.     Wound Dressings ([employer] HONEY WOUND EX) Apply topically.     amiodarone (PACERONE) 200 MG tablet Take 2 tablets (400 mg total) by mouth 2 (two) times daily for 4 days, THEN 1 tablet (200 mg total) daily for 26 days. 42 tablet 0   No current facility-administered medications on file prior to visit.    There are no Patient Instructions on file for this visit. No follow-ups on file.   Kris Hartmann, NP

## 2021-08-29 DIAGNOSIS — Z96641 Presence of right artificial hip joint: Secondary | ICD-10-CM | POA: Diagnosis not present

## 2021-08-29 DIAGNOSIS — D72829 Elevated white blood cell count, unspecified: Secondary | ICD-10-CM | POA: Diagnosis not present

## 2021-08-29 DIAGNOSIS — E1122 Type 2 diabetes mellitus with diabetic chronic kidney disease: Secondary | ICD-10-CM | POA: Diagnosis not present

## 2021-08-29 DIAGNOSIS — Z471 Aftercare following joint replacement surgery: Secondary | ICD-10-CM | POA: Diagnosis not present

## 2021-08-29 DIAGNOSIS — Z992 Dependence on renal dialysis: Secondary | ICD-10-CM | POA: Diagnosis not present

## 2021-08-29 DIAGNOSIS — N186 End stage renal disease: Secondary | ICD-10-CM | POA: Diagnosis not present

## 2021-08-29 DIAGNOSIS — T84020A Dislocation of internal right hip prosthesis, initial encounter: Secondary | ICD-10-CM | POA: Diagnosis not present

## 2021-08-30 ENCOUNTER — Telehealth (INDEPENDENT_AMBULATORY_CARE_PROVIDER_SITE_OTHER): Payer: Self-pay

## 2021-08-30 DIAGNOSIS — N186 End stage renal disease: Secondary | ICD-10-CM | POA: Diagnosis not present

## 2021-08-30 DIAGNOSIS — D72829 Elevated white blood cell count, unspecified: Secondary | ICD-10-CM | POA: Diagnosis not present

## 2021-08-30 DIAGNOSIS — I4891 Unspecified atrial fibrillation: Secondary | ICD-10-CM | POA: Diagnosis not present

## 2021-08-30 DIAGNOSIS — Z992 Dependence on renal dialysis: Secondary | ICD-10-CM | POA: Diagnosis not present

## 2021-08-30 DIAGNOSIS — T84020A Dislocation of internal right hip prosthesis, initial encounter: Secondary | ICD-10-CM | POA: Diagnosis not present

## 2021-08-30 DIAGNOSIS — R042 Hemoptysis: Secondary | ICD-10-CM | POA: Diagnosis not present

## 2021-08-30 DIAGNOSIS — E1122 Type 2 diabetes mellitus with diabetic chronic kidney disease: Secondary | ICD-10-CM | POA: Diagnosis not present

## 2021-08-30 NOTE — Telephone Encounter (Signed)
Spoke with the patient's spouse and he fell and broke his hip over the weekend and is in the hospital at [facility]. I advised that when he is able to call us back to get the patient scheduled for a his left arm brachial axillary graft with Dr. Delana Meyer.

## 2021-08-31 DIAGNOSIS — I482 Chronic atrial fibrillation, unspecified: Secondary | ICD-10-CM | POA: Diagnosis not present

## 2021-08-31 DIAGNOSIS — K5730 Diverticulosis of large intestine without perforation or abscess without bleeding: Secondary | ICD-10-CM | POA: Diagnosis not present

## 2021-08-31 DIAGNOSIS — I82412 Acute embolism and thrombosis of left femoral vein: Secondary | ICD-10-CM | POA: Diagnosis not present

## 2021-08-31 DIAGNOSIS — R0602 Shortness of breath: Secondary | ICD-10-CM | POA: Diagnosis not present

## 2021-08-31 DIAGNOSIS — A419 Sepsis, unspecified organism: Secondary | ICD-10-CM | POA: Diagnosis not present

## 2021-08-31 DIAGNOSIS — G4733 Obstructive sleep apnea (adult) (pediatric): Secondary | ICD-10-CM | POA: Diagnosis not present

## 2021-08-31 DIAGNOSIS — I2510 Atherosclerotic heart disease of native coronary artery without angina pectoris: Secondary | ICD-10-CM | POA: Diagnosis not present

## 2021-08-31 DIAGNOSIS — I2699 Other pulmonary embolism without acute cor pulmonale: Secondary | ICD-10-CM | POA: Diagnosis not present

## 2021-08-31 DIAGNOSIS — J90 Pleural effusion, not elsewhere classified: Secondary | ICD-10-CM | POA: Diagnosis not present

## 2021-08-31 DIAGNOSIS — I10 Essential (primary) hypertension: Secondary | ICD-10-CM | POA: Diagnosis not present

## 2021-08-31 DIAGNOSIS — J189 Pneumonia, unspecified organism: Secondary | ICD-10-CM | POA: Diagnosis not present

## 2021-08-31 DIAGNOSIS — J439 Emphysema, unspecified: Secondary | ICD-10-CM | POA: Diagnosis not present

## 2021-08-31 DIAGNOSIS — E119 Type 2 diabetes mellitus without complications: Secondary | ICD-10-CM | POA: Diagnosis not present

## 2021-08-31 DIAGNOSIS — K259 Gastric ulcer, unspecified as acute or chronic, without hemorrhage or perforation: Secondary | ICD-10-CM | POA: Diagnosis not present

## 2021-08-31 DIAGNOSIS — D638 Anemia in other chronic diseases classified elsewhere: Secondary | ICD-10-CM | POA: Diagnosis not present

## 2021-08-31 DIAGNOSIS — T84020A Dislocation of internal right hip prosthesis, initial encounter: Secondary | ICD-10-CM | POA: Diagnosis not present

## 2021-08-31 DIAGNOSIS — I82513 Chronic embolism and thrombosis of femoral vein, bilateral: Secondary | ICD-10-CM | POA: Diagnosis not present

## 2021-08-31 DIAGNOSIS — Z9989 Dependence on other enabling machines and devices: Secondary | ICD-10-CM | POA: Diagnosis not present

## 2021-08-31 DIAGNOSIS — J9811 Atelectasis: Secondary | ICD-10-CM | POA: Diagnosis not present

## 2021-08-31 DIAGNOSIS — I4891 Unspecified atrial fibrillation: Secondary | ICD-10-CM | POA: Diagnosis not present

## 2021-08-31 DIAGNOSIS — R0902 Hypoxemia: Secondary | ICD-10-CM | POA: Diagnosis not present

## 2021-08-31 DIAGNOSIS — I493 Ventricular premature depolarization: Secondary | ICD-10-CM | POA: Diagnosis not present

## 2021-08-31 LAB — CULTURE, BLOOD (ROUTINE X 2)
Culture: NO GROWTH
Culture: NO GROWTH
Special Requests: ADEQUATE

## 2021-09-01 DIAGNOSIS — T84020A Dislocation of internal right hip prosthesis, initial encounter: Secondary | ICD-10-CM | POA: Diagnosis not present

## 2021-11-22 ENCOUNTER — Encounter (INDEPENDENT_AMBULATORY_CARE_PROVIDER_SITE_OTHER): Payer: Self-pay

## 2022-09-13 IMAGING — CT CT HEAD W/O CM
3 of 4 series · 13 of 47 positions shown, 15 images · non-contrast
Comparison: 09/17/2015

CLINICAL DATA: Fell status change of unknown cause.



[Series 3: head without · axial · non-contrast · 0.48mm/px · z∈[-102,+18]mm · 7 of 33 slices shown, 9 images]
[im 5/33  brain]
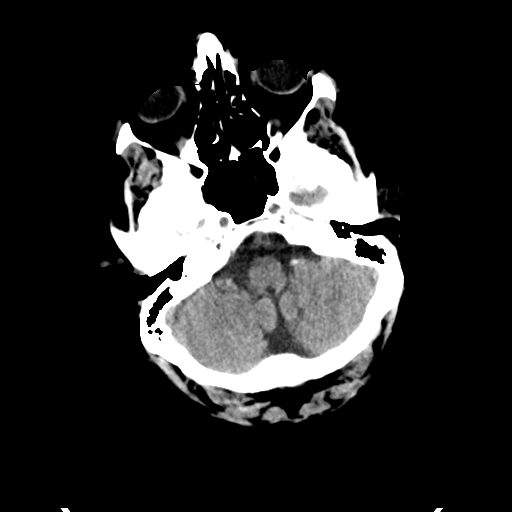
[im 5/33  bone]
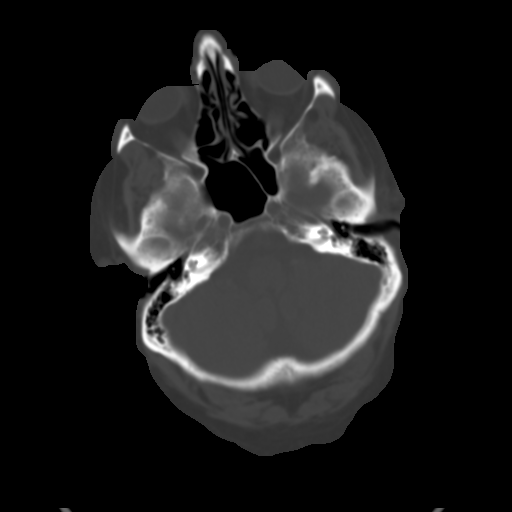
[im 9/33  brain]
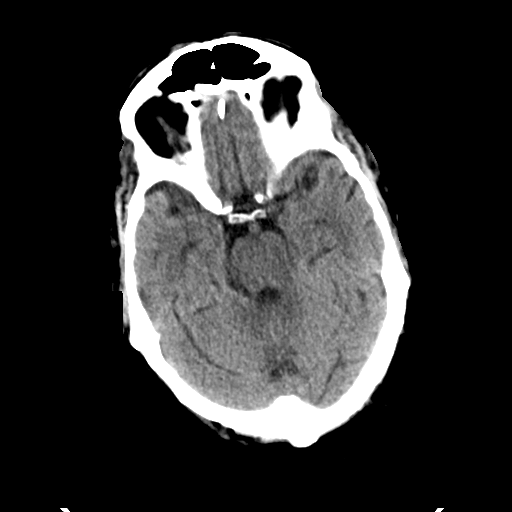
[im 13/33  brain]
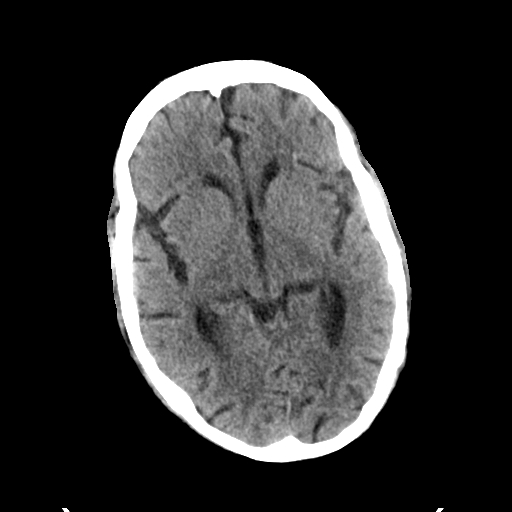
[im 17/33  brain]
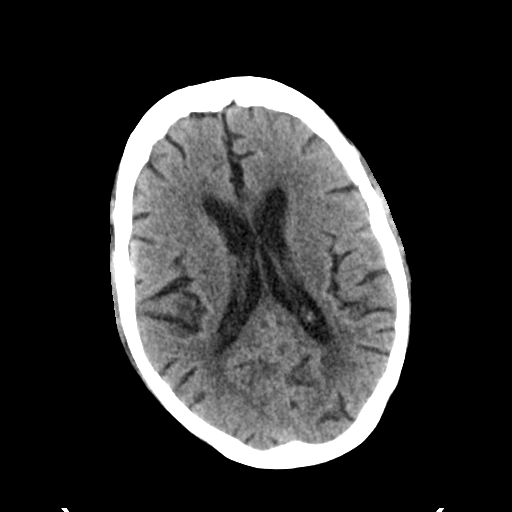
[im 21/33  brain]
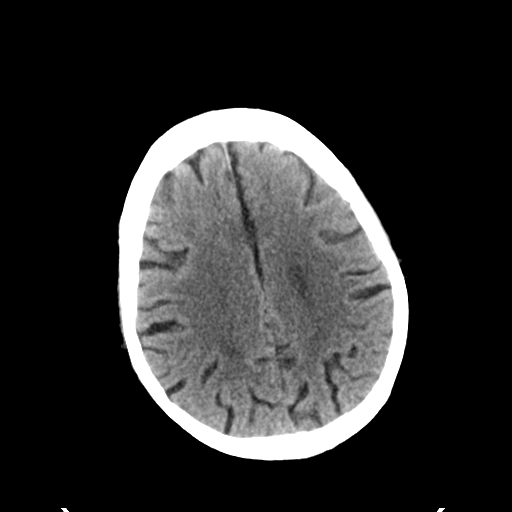
[im 21/33  bone]
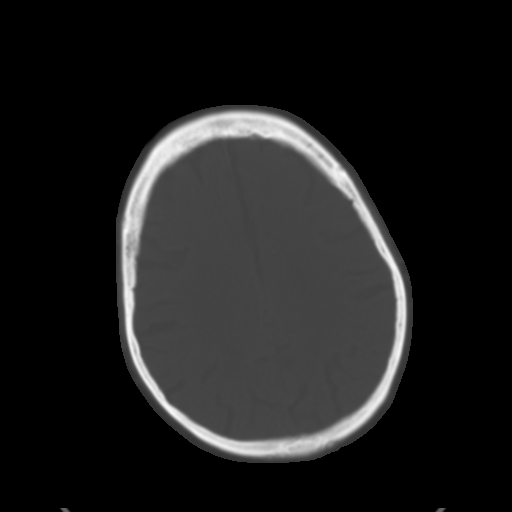
[im 25/33  brain]
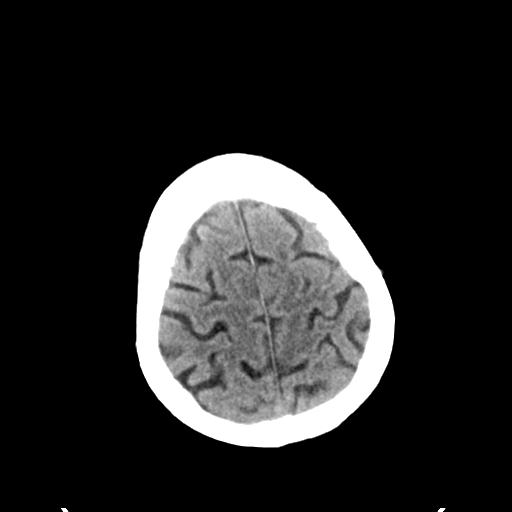
[im 29/33  brain]
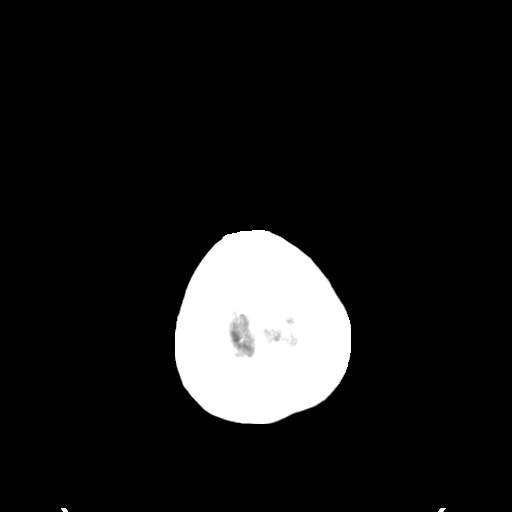

[Series 5: head without cor · coronal · non-contrast · 0.32mm/px · 3 of 79 slices shown]
[im 27/79  brain]
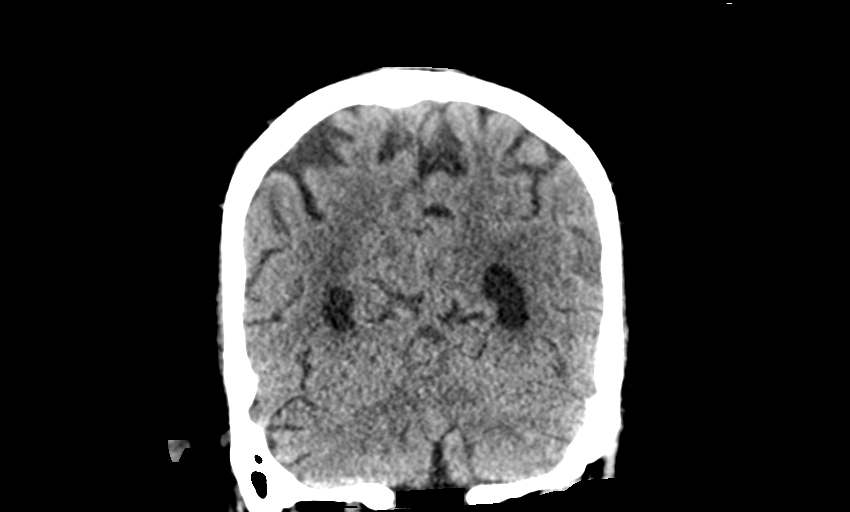
[im 35/79  brain]
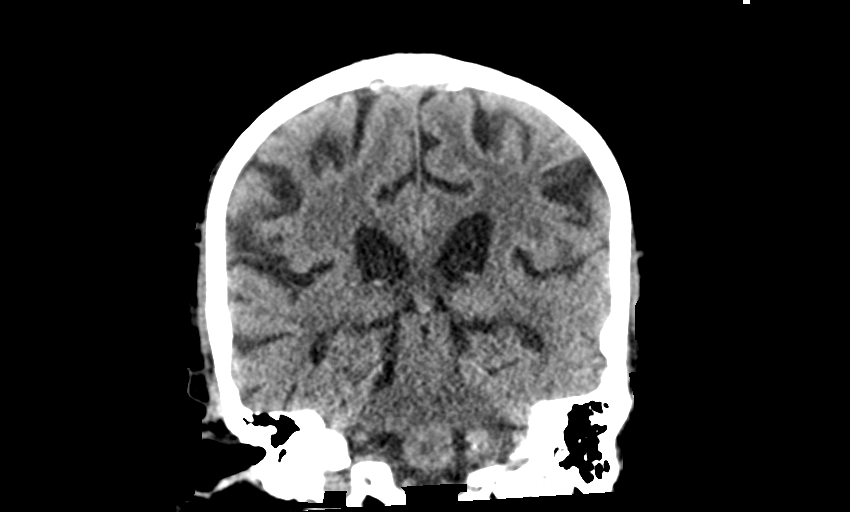
[im 44/79  brain]
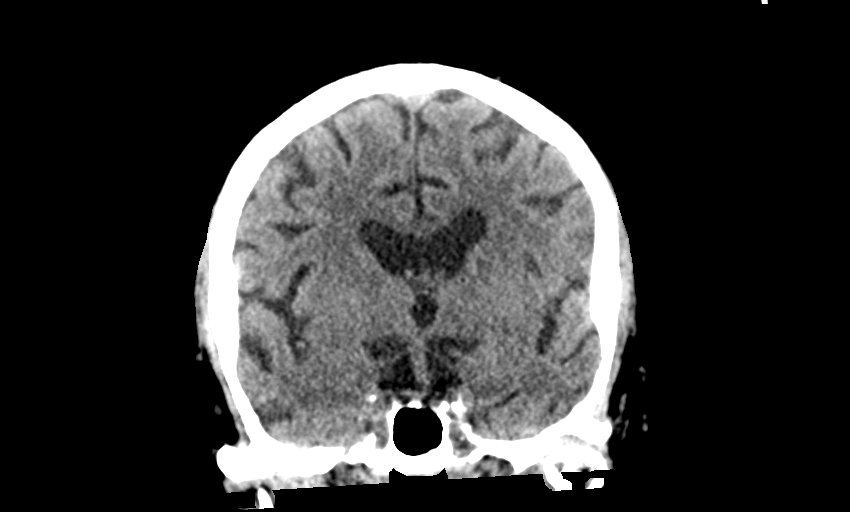

[Series 6: head without sag · sagittal · non-contrast · 0.32mm/px · 3 of 67 slices shown]
[im 23/67  brain]
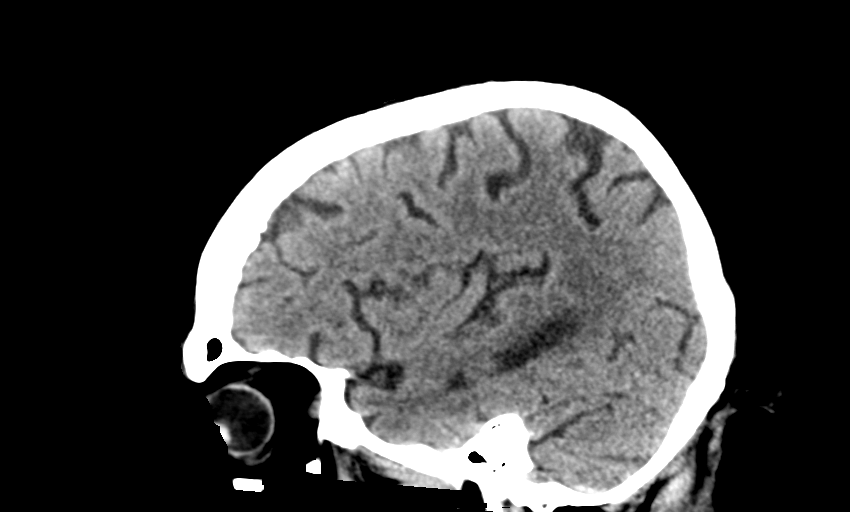
[im 34/67  brain]
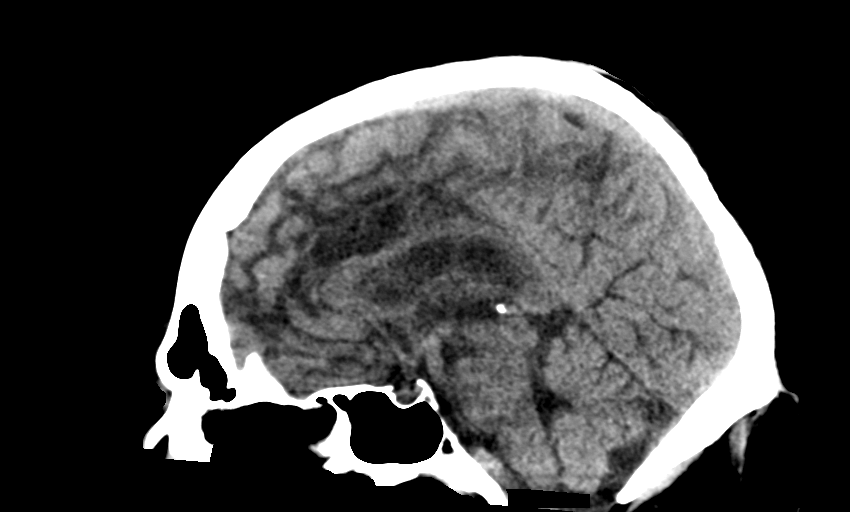
[im 45/67  brain]
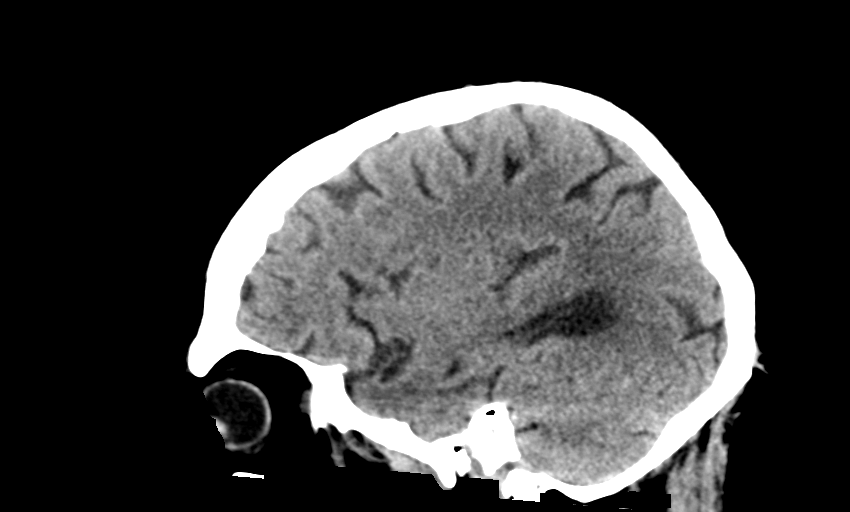

[13 of 47 positions shown; findings below may reference images not displayed]

FINDINGS: CT HEAD FINDINGS

Brain: No evidence of acute infarction, hemorrhage, hydrocephalus,
extra-axial collection or mass lesion/mass effect.

Mild ventricular sulcal enlargement reflecting age-appropriate
volume loss. Mild periventricular white matter hypoattenuation is
also present consistent with chronic microvascular ischemic change.

Vascular: No hyperdense vessel or unexpected calcification.

Skull: Normal. Negative for fracture or focal lesion.

Sinuses/Orbits: Globes and orbits are unremarkable. Visualized
sinuses are clear.

Other: None.

CT CERVICAL SPINE FINDINGS

Alignment: Grade 1 anterolisthesis, C7 on T1 and C2 on C3, both
degenerative. No other spondylolisthesis. Reversed cervical
lordosis, apex at C3-C4.

Skull base and vertebrae: No acute fracture. No primary bone lesion
or focal pathologic process.

Soft tissues and spinal canal: No prevertebral fluid or swelling. No
visible canal hematoma.

Disc levels: Mild to moderate loss of disc height at C2-C3. Marked
loss of disc height at C3-C4, C4-C5, C5-C6 and C6-C7. Moderate loss
of disc height at C7-T1. No evidence of a disc herniation.

Upper chest: No acute findings.

Other: None.
IMPRESSION: HEAD CT

1. No acute intracranial abnormalities.
2. Age-appropriate volume loss and mild chronic microvascular
ischemic change.

CERVICAL CT

1. No fracture or acute finding.
2. Degenerative changes as detailed.

## 2022-09-13 IMAGING — CR DG CHEST 1V
1 series · 1 of 1 positions shown · non-contrast
Comparison: 06/04/2021

CLINICAL DATA: Confusion and fall

EXAM:
CHEST  1 VIEW

[chest ap]
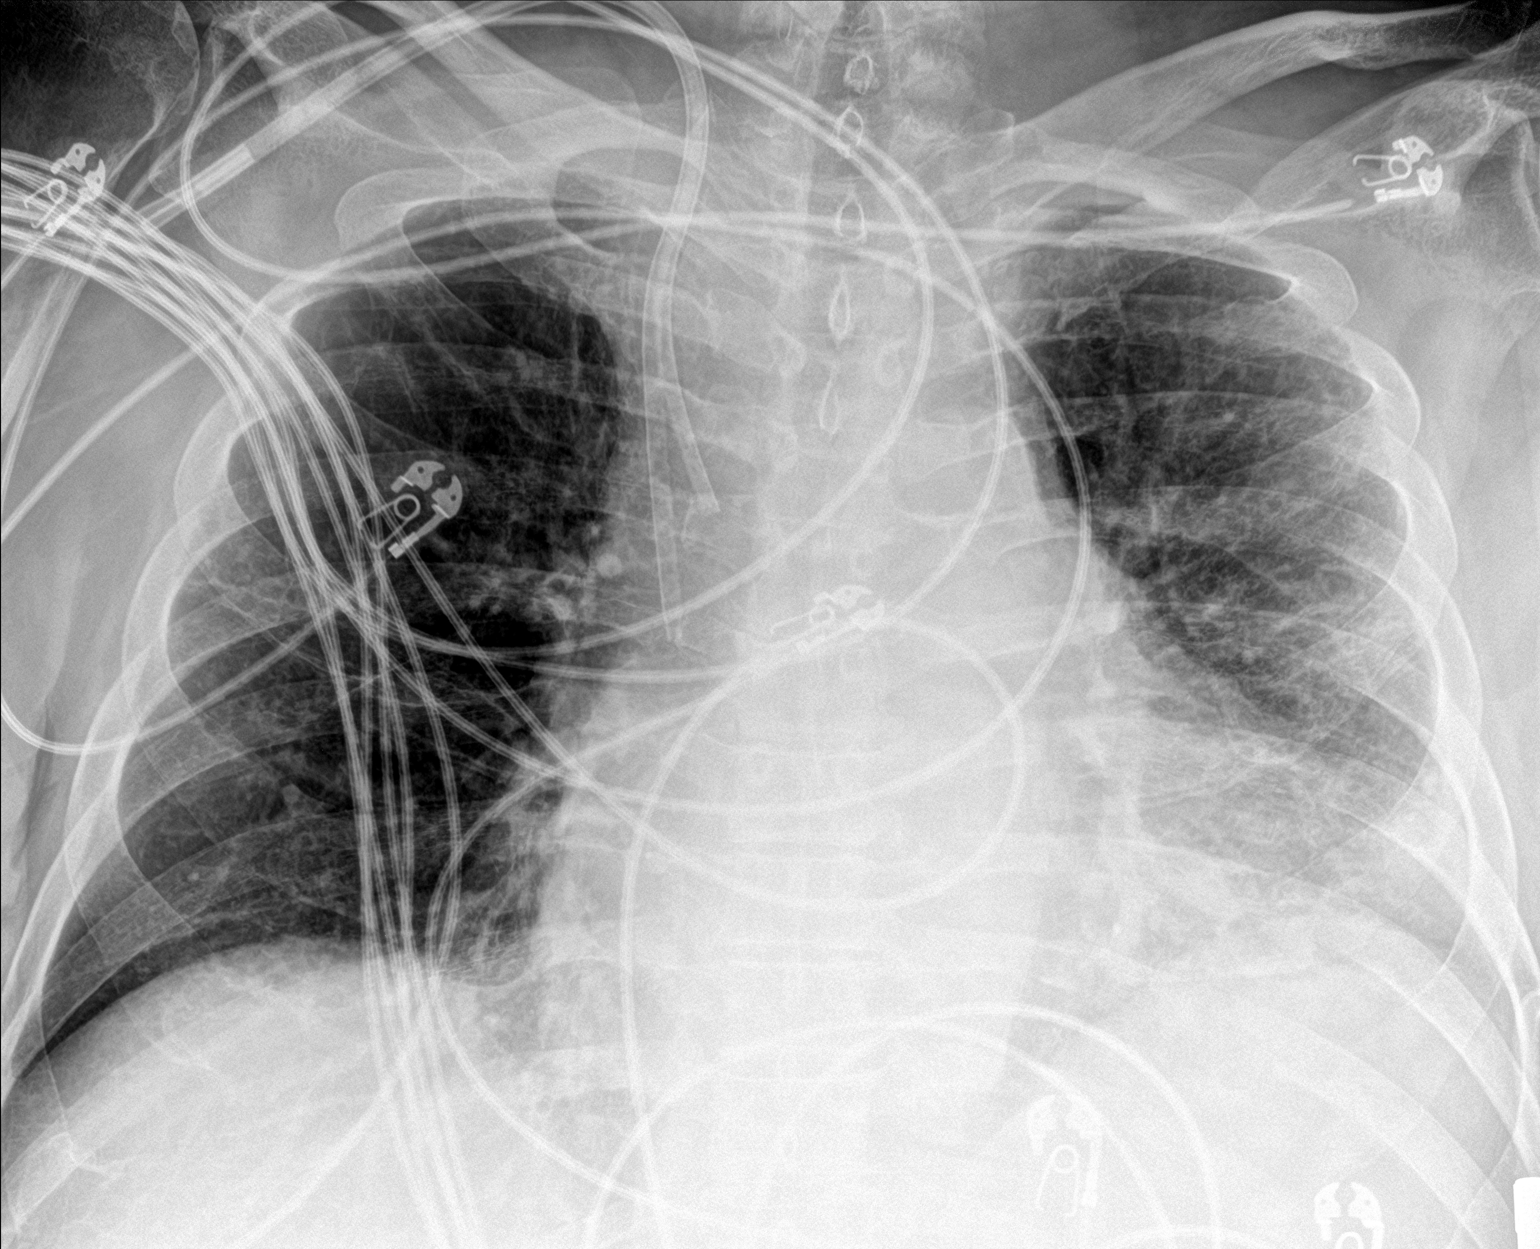

[1 of 1 positions shown; findings below may reference images not displayed]

FINDINGS: Cardiomegaly. Large-bore right neck multi lumen vascular catheter.
Unchanged scarring and or atelectasis of the left lung base. The
visualized skeletal structures are unremarkable.
IMPRESSION: Cardiomegaly without acute abnormality of the lungs in AP portable
projection.
